# Patient Record
Sex: Female | Born: 1975 | Race: Black or African American | Hispanic: No | Marital: Married | Smoking: Never smoker
Health system: Southern US, Community
[De-identification: ages and names within clinical notes are randomized; demographics above are authoritative.]

## PROBLEM LIST (undated history)

## (undated) ENCOUNTER — Inpatient Hospital Stay (HOSPITAL_COMMUNITY): Payer: Self-pay

## (undated) DIAGNOSIS — R87629 Unspecified abnormal cytological findings in specimens from vagina: Secondary | ICD-10-CM

## (undated) DIAGNOSIS — N8110 Cystocele, unspecified: Secondary | ICD-10-CM

## (undated) DIAGNOSIS — Z8759 Personal history of other complications of pregnancy, childbirth and the puerperium: Secondary | ICD-10-CM

## (undated) DIAGNOSIS — R51 Headache: Secondary | ICD-10-CM

## (undated) DIAGNOSIS — N393 Stress incontinence (female) (male): Secondary | ICD-10-CM

## (undated) DIAGNOSIS — Z8639 Personal history of other endocrine, nutritional and metabolic disease: Secondary | ICD-10-CM

## (undated) DIAGNOSIS — O24419 Gestational diabetes mellitus in pregnancy, unspecified control: Secondary | ICD-10-CM

## (undated) DIAGNOSIS — R7303 Prediabetes: Secondary | ICD-10-CM

## (undated) DIAGNOSIS — D649 Anemia, unspecified: Secondary | ICD-10-CM

## (undated) DIAGNOSIS — O039 Complete or unspecified spontaneous abortion without complication: Secondary | ICD-10-CM

## (undated) DIAGNOSIS — K219 Gastro-esophageal reflux disease without esophagitis: Secondary | ICD-10-CM

## (undated) DIAGNOSIS — F1190 Opioid use, unspecified, uncomplicated: Secondary | ICD-10-CM

## (undated) DIAGNOSIS — Z9889 Other specified postprocedural states: Secondary | ICD-10-CM

## (undated) DIAGNOSIS — G43909 Migraine, unspecified, not intractable, without status migrainosus: Secondary | ICD-10-CM

## (undated) DIAGNOSIS — G8929 Other chronic pain: Secondary | ICD-10-CM

## (undated) DIAGNOSIS — L309 Dermatitis, unspecified: Secondary | ICD-10-CM

## (undated) DIAGNOSIS — F329 Major depressive disorder, single episode, unspecified: Secondary | ICD-10-CM

## (undated) DIAGNOSIS — K589 Irritable bowel syndrome without diarrhea: Secondary | ICD-10-CM

## (undated) DIAGNOSIS — Z8669 Personal history of other diseases of the nervous system and sense organs: Secondary | ICD-10-CM

## (undated) DIAGNOSIS — Z8632 Personal history of gestational diabetes: Secondary | ICD-10-CM

## (undated) DIAGNOSIS — Z87440 Personal history of urinary (tract) infections: Secondary | ICD-10-CM

## (undated) DIAGNOSIS — M793 Panniculitis, unspecified: Secondary | ICD-10-CM

## (undated) DIAGNOSIS — F419 Anxiety disorder, unspecified: Secondary | ICD-10-CM

## (undated) DIAGNOSIS — Z8659 Personal history of other mental and behavioral disorders: Secondary | ICD-10-CM

## (undated) DIAGNOSIS — IMO0002 Reserved for concepts with insufficient information to code with codable children: Secondary | ICD-10-CM

## (undated) DIAGNOSIS — Z8742 Personal history of other diseases of the female genital tract: Secondary | ICD-10-CM

## (undated) DIAGNOSIS — Z8619 Personal history of other infectious and parasitic diseases: Secondary | ICD-10-CM

## (undated) HISTORY — DX: Personal history of other endocrine, nutritional and metabolic disease: Z86.39

## (undated) HISTORY — DX: Gastro-esophageal reflux disease without esophagitis: K21.9

## (undated) HISTORY — DX: Personal history of other infectious and parasitic diseases: Z86.19

## (undated) HISTORY — DX: Reserved for concepts with insufficient information to code with codable children: IMO0002

## (undated) HISTORY — PX: WISDOM TOOTH EXTRACTION: SHX21

## (undated) HISTORY — DX: Personal history of other diseases of the nervous system and sense organs: Z86.69

## (undated) HISTORY — PX: GASTRIC BYPASS: SHX52

## (undated) HISTORY — DX: Personal history of urinary (tract) infections: Z87.440

## (undated) HISTORY — DX: Major depressive disorder, single episode, unspecified: F32.9

## (undated) HISTORY — DX: Personal history of other mental and behavioral disorders: Z86.59

---

## 1998-08-06 ENCOUNTER — Emergency Department (HOSPITAL_COMMUNITY): Admission: EM | Admit: 1998-08-06 | Discharge: 1998-08-06 | Payer: Self-pay | Admitting: Emergency Medicine

## 1998-09-11 ENCOUNTER — Other Ambulatory Visit: Admission: RE | Admit: 1998-09-11 | Discharge: 1998-09-11 | Payer: Self-pay | Admitting: *Deleted

## 1998-11-14 ENCOUNTER — Other Ambulatory Visit: Admission: RE | Admit: 1998-11-14 | Discharge: 1998-11-14 | Payer: Self-pay | Admitting: *Deleted

## 1999-01-01 ENCOUNTER — Emergency Department (HOSPITAL_COMMUNITY): Admission: EM | Admit: 1999-01-01 | Discharge: 1999-01-01 | Payer: Self-pay | Admitting: Emergency Medicine

## 1999-01-01 ENCOUNTER — Encounter: Payer: Self-pay | Admitting: Emergency Medicine

## 1999-02-20 ENCOUNTER — Other Ambulatory Visit: Admission: RE | Admit: 1999-02-20 | Discharge: 1999-02-20 | Payer: Self-pay | Admitting: Obstetrics and Gynecology

## 1999-04-10 ENCOUNTER — Inpatient Hospital Stay (HOSPITAL_COMMUNITY): Admission: AD | Admit: 1999-04-10 | Discharge: 1999-04-12 | Payer: Self-pay | Admitting: *Deleted

## 1999-05-12 ENCOUNTER — Other Ambulatory Visit: Admission: RE | Admit: 1999-05-12 | Discharge: 1999-05-12 | Payer: Self-pay | Admitting: Obstetrics and Gynecology

## 2000-09-12 ENCOUNTER — Emergency Department (HOSPITAL_COMMUNITY): Admission: EM | Admit: 2000-09-12 | Discharge: 2000-09-12 | Payer: Self-pay | Admitting: Emergency Medicine

## 2001-09-05 ENCOUNTER — Emergency Department (HOSPITAL_COMMUNITY): Admission: EM | Admit: 2001-09-05 | Discharge: 2001-09-05 | Payer: Self-pay | Admitting: Emergency Medicine

## 2001-10-20 ENCOUNTER — Emergency Department (HOSPITAL_COMMUNITY): Admission: EM | Admit: 2001-10-20 | Discharge: 2001-10-20 | Payer: Self-pay | Admitting: Emergency Medicine

## 2001-10-20 ENCOUNTER — Encounter: Payer: Self-pay | Admitting: Emergency Medicine

## 2001-10-31 ENCOUNTER — Ambulatory Visit (HOSPITAL_COMMUNITY): Admission: RE | Admit: 2001-10-31 | Discharge: 2001-10-31 | Payer: Self-pay | Admitting: Family Medicine

## 2001-12-15 ENCOUNTER — Encounter: Payer: Self-pay | Admitting: Obstetrics and Gynecology

## 2001-12-15 ENCOUNTER — Inpatient Hospital Stay (HOSPITAL_COMMUNITY): Admission: AD | Admit: 2001-12-15 | Discharge: 2001-12-15 | Payer: Self-pay | Admitting: Obstetrics and Gynecology

## 2002-01-10 ENCOUNTER — Other Ambulatory Visit: Admission: RE | Admit: 2002-01-10 | Discharge: 2002-01-10 | Payer: Self-pay | Admitting: Obstetrics and Gynecology

## 2002-03-01 ENCOUNTER — Encounter: Admission: RE | Admit: 2002-03-01 | Discharge: 2002-05-30 | Payer: Self-pay | Admitting: Obstetrics and Gynecology

## 2002-03-07 ENCOUNTER — Ambulatory Visit (HOSPITAL_COMMUNITY): Admission: RE | Admit: 2002-03-07 | Discharge: 2002-03-07 | Payer: Self-pay | Admitting: Obstetrics and Gynecology

## 2002-03-07 ENCOUNTER — Encounter: Payer: Self-pay | Admitting: Obstetrics and Gynecology

## 2002-04-25 ENCOUNTER — Inpatient Hospital Stay (HOSPITAL_COMMUNITY): Admission: AD | Admit: 2002-04-25 | Discharge: 2002-04-25 | Payer: Self-pay | Admitting: Obstetrics and Gynecology

## 2002-07-06 ENCOUNTER — Inpatient Hospital Stay: Admission: AD | Admit: 2002-07-06 | Discharge: 2002-07-06 | Payer: Self-pay | Admitting: Obstetrics and Gynecology

## 2002-07-07 ENCOUNTER — Inpatient Hospital Stay (HOSPITAL_COMMUNITY): Admission: AD | Admit: 2002-07-07 | Discharge: 2002-07-07 | Payer: Self-pay | Admitting: Obstetrics and Gynecology

## 2002-08-09 ENCOUNTER — Encounter: Payer: Self-pay | Admitting: Obstetrics and Gynecology

## 2002-08-09 ENCOUNTER — Inpatient Hospital Stay (HOSPITAL_COMMUNITY): Admission: AD | Admit: 2002-08-09 | Discharge: 2002-08-12 | Payer: Self-pay | Admitting: Obstetrics and Gynecology

## 2002-08-25 ENCOUNTER — Inpatient Hospital Stay (HOSPITAL_COMMUNITY): Admission: AD | Admit: 2002-08-25 | Discharge: 2002-08-25 | Payer: Self-pay | Admitting: Obstetrics and Gynecology

## 2003-04-07 ENCOUNTER — Emergency Department (HOSPITAL_COMMUNITY): Admission: EM | Admit: 2003-04-07 | Discharge: 2003-04-07 | Payer: Self-pay | Admitting: Emergency Medicine

## 2003-08-09 ENCOUNTER — Encounter: Payer: Self-pay | Admitting: Obstetrics and Gynecology

## 2003-08-09 ENCOUNTER — Inpatient Hospital Stay (HOSPITAL_COMMUNITY): Admission: AD | Admit: 2003-08-09 | Discharge: 2003-08-09 | Payer: Self-pay | Admitting: Obstetrics and Gynecology

## 2003-08-21 ENCOUNTER — Other Ambulatory Visit: Admission: RE | Admit: 2003-08-21 | Discharge: 2003-08-21 | Payer: Self-pay | Admitting: Obstetrics and Gynecology

## 2004-01-15 ENCOUNTER — Encounter: Admission: RE | Admit: 2004-01-15 | Discharge: 2004-01-15 | Payer: Self-pay | Admitting: Obstetrics and Gynecology

## 2004-01-15 ENCOUNTER — Inpatient Hospital Stay (HOSPITAL_COMMUNITY): Admission: AD | Admit: 2004-01-15 | Discharge: 2004-01-15 | Payer: Self-pay | Admitting: Obstetrics and Gynecology

## 2004-01-25 ENCOUNTER — Inpatient Hospital Stay (HOSPITAL_COMMUNITY): Admission: AD | Admit: 2004-01-25 | Discharge: 2004-01-25 | Payer: Self-pay | Admitting: Obstetrics and Gynecology

## 2004-02-18 ENCOUNTER — Inpatient Hospital Stay (HOSPITAL_COMMUNITY): Admission: AD | Admit: 2004-02-18 | Discharge: 2004-02-23 | Payer: Self-pay | Admitting: Obstetrics and Gynecology

## 2004-03-27 ENCOUNTER — Emergency Department (HOSPITAL_COMMUNITY): Admission: EM | Admit: 2004-03-27 | Discharge: 2004-03-27 | Payer: Self-pay | Admitting: Emergency Medicine

## 2004-03-31 ENCOUNTER — Emergency Department (HOSPITAL_COMMUNITY): Admission: EM | Admit: 2004-03-31 | Discharge: 2004-03-31 | Payer: Self-pay | Admitting: Family Medicine

## 2005-01-14 ENCOUNTER — Inpatient Hospital Stay (HOSPITAL_COMMUNITY): Admission: AD | Admit: 2005-01-14 | Discharge: 2005-01-14 | Payer: Self-pay | Admitting: Obstetrics and Gynecology

## 2005-02-01 ENCOUNTER — Emergency Department (HOSPITAL_COMMUNITY): Admission: EM | Admit: 2005-02-01 | Discharge: 2005-02-01 | Payer: Self-pay | Admitting: Family Medicine

## 2005-02-17 ENCOUNTER — Other Ambulatory Visit: Admission: RE | Admit: 2005-02-17 | Discharge: 2005-02-17 | Payer: Self-pay | Admitting: Obstetrics and Gynecology

## 2006-04-18 ENCOUNTER — Emergency Department (HOSPITAL_COMMUNITY): Admission: EM | Admit: 2006-04-18 | Discharge: 2006-04-18 | Payer: Self-pay | Admitting: Family Medicine

## 2006-07-17 ENCOUNTER — Inpatient Hospital Stay (HOSPITAL_COMMUNITY): Admission: AD | Admit: 2006-07-17 | Discharge: 2006-07-17 | Payer: Self-pay | Admitting: Obstetrics & Gynecology

## 2006-11-27 ENCOUNTER — Inpatient Hospital Stay (HOSPITAL_COMMUNITY): Admission: AD | Admit: 2006-11-27 | Discharge: 2006-11-27 | Payer: Self-pay | Admitting: Obstetrics & Gynecology

## 2006-12-07 HISTORY — PX: CHOLECYSTECTOMY: SHX55

## 2007-02-04 ENCOUNTER — Inpatient Hospital Stay (HOSPITAL_COMMUNITY): Admission: AD | Admit: 2007-02-04 | Discharge: 2007-02-04 | Payer: Self-pay | Admitting: Family Medicine

## 2007-03-19 ENCOUNTER — Inpatient Hospital Stay (HOSPITAL_COMMUNITY): Admission: AD | Admit: 2007-03-19 | Discharge: 2007-03-19 | Payer: Self-pay | Admitting: Obstetrics & Gynecology

## 2007-03-21 ENCOUNTER — Emergency Department (HOSPITAL_COMMUNITY): Admission: EM | Admit: 2007-03-21 | Discharge: 2007-03-21 | Payer: Self-pay | Admitting: Emergency Medicine

## 2007-04-02 ENCOUNTER — Inpatient Hospital Stay (HOSPITAL_COMMUNITY): Admission: AD | Admit: 2007-04-02 | Discharge: 2007-04-02 | Payer: Self-pay | Admitting: Family Medicine

## 2007-05-23 ENCOUNTER — Encounter (INDEPENDENT_AMBULATORY_CARE_PROVIDER_SITE_OTHER): Payer: Self-pay | Admitting: Internal Medicine

## 2007-05-23 ENCOUNTER — Ambulatory Visit: Payer: Self-pay | Admitting: Internal Medicine

## 2007-05-23 LAB — CONVERTED CEMR LAB
Cholesterol: 251 mg/dL
Creatinine, Ser: 0.73 mg/dL
HDL: 50 mg/dL
LDL Cholesterol: 180 mg/dL
Potassium: 3.7 meq/L
Triglycerides: 106 mg/dL

## 2007-05-24 ENCOUNTER — Ambulatory Visit: Payer: Self-pay | Admitting: Internal Medicine

## 2007-05-24 DIAGNOSIS — N921 Excessive and frequent menstruation with irregular cycle: Secondary | ICD-10-CM | POA: Insufficient documentation

## 2007-05-26 ENCOUNTER — Ambulatory Visit: Payer: Self-pay | Admitting: Internal Medicine

## 2007-07-01 ENCOUNTER — Ambulatory Visit: Payer: Self-pay | Admitting: Internal Medicine

## 2007-07-01 LAB — CONVERTED CEMR LAB
Basophils Absolute: 0.1 10*3/uL (ref 0.0–0.1)
Basophils Relative: 1 % (ref 0–1)
Eosinophils Absolute: 0.1 10*3/uL (ref 0.0–0.7)
Eosinophils Relative: 1 % (ref 0–5)
HCT: 41.6 %
HCT: 41.6 % (ref 36.0–46.0)
Hemoglobin: 14 g/dL
Hemoglobin: 14 g/dL (ref 12.0–15.0)
Lymphocytes Relative: 33 % (ref 12–46)
Lymphs Abs: 2.2 10*3/uL (ref 0.7–3.3)
MCHC: 33.7 g/dL (ref 30.0–36.0)
MCV: 81.1 fL (ref 78.0–100.0)
Monocytes Absolute: 0.5 10*3/uL (ref 0.2–0.7)
Monocytes Relative: 8 % (ref 3–11)
Neutro Abs: 3.9 10*3/uL (ref 1.7–7.7)
Neutrophils Relative %: 58 % (ref 43–77)
Platelets: 239 10*3/uL
Platelets: 239 10*3/uL (ref 150–400)
RBC: 5.13 M/uL
RBC: 5.13 M/uL — ABNORMAL HIGH (ref 3.87–5.11)
RDW: 13.6 % (ref 11.5–14.0)
WBC: 6.7 10*3/uL
WBC: 6.7 10*3/uL (ref 4.0–10.5)

## 2007-07-24 ENCOUNTER — Inpatient Hospital Stay (HOSPITAL_COMMUNITY): Admission: EM | Admit: 2007-07-24 | Discharge: 2007-07-26 | Payer: Self-pay | Admitting: Emergency Medicine

## 2007-07-24 ENCOUNTER — Ambulatory Visit: Payer: Self-pay | Admitting: Internal Medicine

## 2007-08-01 ENCOUNTER — Encounter (INDEPENDENT_AMBULATORY_CARE_PROVIDER_SITE_OTHER): Payer: Self-pay | Admitting: Internal Medicine

## 2007-08-01 DIAGNOSIS — E7800 Pure hypercholesterolemia, unspecified: Secondary | ICD-10-CM | POA: Insufficient documentation

## 2007-08-01 DIAGNOSIS — O99340 Other mental disorders complicating pregnancy, unspecified trimester: Secondary | ICD-10-CM

## 2007-08-01 DIAGNOSIS — F329 Major depressive disorder, single episode, unspecified: Secondary | ICD-10-CM | POA: Insufficient documentation

## 2007-08-01 DIAGNOSIS — E669 Obesity, unspecified: Secondary | ICD-10-CM | POA: Insufficient documentation

## 2007-08-01 DIAGNOSIS — L259 Unspecified contact dermatitis, unspecified cause: Secondary | ICD-10-CM | POA: Insufficient documentation

## 2007-08-02 ENCOUNTER — Ambulatory Visit: Payer: Self-pay | Admitting: Internal Medicine

## 2007-08-02 ENCOUNTER — Encounter: Payer: Self-pay | Admitting: Internal Medicine

## 2007-08-02 ENCOUNTER — Ambulatory Visit (HOSPITAL_COMMUNITY): Admission: RE | Admit: 2007-08-02 | Discharge: 2007-08-02 | Payer: Self-pay | Admitting: Internal Medicine

## 2007-08-02 ENCOUNTER — Encounter (INDEPENDENT_AMBULATORY_CARE_PROVIDER_SITE_OTHER): Payer: Self-pay | Admitting: Internal Medicine

## 2007-08-02 DIAGNOSIS — F411 Generalized anxiety disorder: Secondary | ICD-10-CM | POA: Insufficient documentation

## 2007-08-02 DIAGNOSIS — L509 Urticaria, unspecified: Secondary | ICD-10-CM | POA: Insufficient documentation

## 2007-08-02 DIAGNOSIS — R1013 Epigastric pain: Secondary | ICD-10-CM | POA: Insufficient documentation

## 2007-08-02 DIAGNOSIS — R109 Unspecified abdominal pain: Secondary | ICD-10-CM | POA: Insufficient documentation

## 2007-08-02 LAB — CONVERTED CEMR LAB
ALT: 18 units/L (ref 0–35)
AST: 15 units/L (ref 0–37)
Albumin: 3.9 g/dL (ref 3.5–5.2)
Alkaline Phosphatase: 66 units/L (ref 39–117)
BUN: 4 mg/dL — ABNORMAL LOW (ref 6–23)
Basophils Absolute: 0 10*3/uL (ref 0.0–0.1)
Basophils Relative: 1 % (ref 0–1)
CO2: 23 meq/L (ref 19–32)
Calcium: 9 mg/dL (ref 8.4–10.5)
Chloride: 106 meq/L (ref 96–112)
Creatinine, Ser: 0.76 mg/dL (ref 0.40–1.20)
Eosinophils Absolute: 0.1 10*3/uL (ref 0.0–0.7)
Eosinophils Relative: 1 % (ref 0–5)
Glucose, Bld: 95 mg/dL (ref 70–99)
HCT: 36.5 % (ref 36.0–46.0)
Hemoglobin: 12.1 g/dL (ref 12.0–15.0)
Lymphocytes Relative: 30 % (ref 12–46)
Lymphs Abs: 2 10*3/uL (ref 0.7–3.3)
MCHC: 33.2 g/dL (ref 30.0–36.0)
MCV: 83.1 fL (ref 78.0–100.0)
Monocytes Absolute: 0.4 10*3/uL (ref 0.2–0.7)
Monocytes Relative: 6 % (ref 3–11)
Neutro Abs: 4 10*3/uL (ref 1.7–7.7)
Neutrophils Relative %: 62 % (ref 43–77)
Platelets: 282 10*3/uL (ref 150–400)
Potassium: 4.8 meq/L (ref 3.5–5.3)
RBC: 4.39 M/uL (ref 3.87–5.11)
RDW: 15.5 % — ABNORMAL HIGH (ref 11.5–14.0)
Sodium: 139 meq/L (ref 135–145)
Total Bilirubin: 0.8 mg/dL (ref 0.3–1.2)
Total Protein: 6.8 g/dL (ref 6.0–8.3)
WBC: 6.5 10*3/uL (ref 4.0–10.5)

## 2007-08-04 ENCOUNTER — Telehealth (INDEPENDENT_AMBULATORY_CARE_PROVIDER_SITE_OTHER): Payer: Self-pay | Admitting: Internal Medicine

## 2007-08-04 ENCOUNTER — Emergency Department (HOSPITAL_COMMUNITY): Admission: EM | Admit: 2007-08-04 | Discharge: 2007-08-04 | Payer: Self-pay | Admitting: Emergency Medicine

## 2007-08-16 ENCOUNTER — Ambulatory Visit (HOSPITAL_COMMUNITY): Admission: RE | Admit: 2007-08-16 | Discharge: 2007-08-16 | Payer: Self-pay | Admitting: General Surgery

## 2007-08-16 ENCOUNTER — Encounter (INDEPENDENT_AMBULATORY_CARE_PROVIDER_SITE_OTHER): Payer: Self-pay | Admitting: General Surgery

## 2007-08-16 HISTORY — PX: CHOLECYSTECTOMY, LAPAROSCOPIC: SHX56

## 2007-08-26 ENCOUNTER — Encounter (INDEPENDENT_AMBULATORY_CARE_PROVIDER_SITE_OTHER): Payer: Self-pay | Admitting: Internal Medicine

## 2007-09-23 ENCOUNTER — Inpatient Hospital Stay (HOSPITAL_COMMUNITY): Admission: AD | Admit: 2007-09-23 | Discharge: 2007-09-24 | Payer: Self-pay | Admitting: Obstetrics and Gynecology

## 2007-09-28 ENCOUNTER — Telehealth (INDEPENDENT_AMBULATORY_CARE_PROVIDER_SITE_OTHER): Payer: Self-pay | Admitting: *Deleted

## 2007-10-21 ENCOUNTER — Encounter (INDEPENDENT_AMBULATORY_CARE_PROVIDER_SITE_OTHER): Payer: Self-pay | Admitting: Internal Medicine

## 2007-10-21 ENCOUNTER — Ambulatory Visit: Payer: Self-pay | Admitting: Internal Medicine

## 2007-10-21 DIAGNOSIS — F502 Bulimia nervosa, unspecified: Secondary | ICD-10-CM

## 2007-10-21 HISTORY — DX: Bulimia nervosa: F50.2

## 2007-10-21 HISTORY — DX: Bulimia nervosa, unspecified: F50.20

## 2007-10-21 LAB — CONVERTED CEMR LAB
Bilirubin Urine: NEGATIVE
Blood in Urine, dipstick: NEGATIVE
Glucose, Urine, Semiquant: NEGATIVE
Ketones, urine, test strip: NEGATIVE
Nitrite: NEGATIVE
Protein, U semiquant: 30
Specific Gravity, Urine: >=1.03
Urobilinogen, UA: NEGATIVE
WBC Urine, dipstick: NEGATIVE
pH: 6

## 2007-10-23 ENCOUNTER — Encounter (INDEPENDENT_AMBULATORY_CARE_PROVIDER_SITE_OTHER): Payer: Self-pay | Admitting: Internal Medicine

## 2007-10-23 LAB — CONVERTED CEMR LAB
ALT: 10 units/L (ref 0–35)
AST: 11 units/L (ref 0–37)
Albumin: 4.3 g/dL (ref 3.5–5.2)
Alkaline Phosphatase: 96 units/L (ref 39–117)
BUN: 7 mg/dL (ref 6–23)
Basophils Absolute: 0.1 10*3/uL (ref 0.0–0.1)
Basophils Relative: 1 % (ref 0–1)
CO2: 24 meq/L (ref 19–32)
Calcium: 8.9 mg/dL (ref 8.4–10.5)
Chlamydia, DNA Probe: NEGATIVE
Chloride: 102 meq/L (ref 96–112)
Cholesterol: 248 mg/dL — ABNORMAL HIGH (ref 0–200)
Creatinine, Ser: 0.8 mg/dL (ref 0.40–1.20)
Eosinophils Absolute: 0.1 10*3/uL — ABNORMAL LOW (ref 0.2–0.7)
Eosinophils Relative: 1 % (ref 0–5)
GC Probe Amp, Genital: NEGATIVE
Glucose, Bld: 70 mg/dL (ref 70–99)
HCT: 42.8 % (ref 36.0–46.0)
HDL: 52 mg/dL (ref >39)
Hemoglobin: 13.8 g/dL (ref 12.0–15.0)
LDL Cholesterol: 183 mg/dL — ABNORMAL HIGH (ref 0–99)
Lymphocytes Relative: 35 % (ref 12–46)
Lymphs Abs: 2.8 10*3/uL (ref 0.7–4.0)
MCHC: 32.2 g/dL (ref 30.0–36.0)
MCV: 88.2 fL (ref 78.0–100.0)
Monocytes Absolute: 0.5 10*3/uL (ref 0.1–1.0)
Monocytes Relative: 6 % (ref 3–12)
Neutro Abs: 4.6 10*3/uL (ref 1.7–7.7)
Neutrophils Relative %: 58 % (ref 43–77)
Platelets: 285 10*3/uL (ref 150–400)
Potassium: 4.1 meq/L (ref 3.5–5.3)
RBC: 4.85 M/uL (ref 3.87–5.11)
RDW: 13.1 % (ref 11.5–15.5)
Sodium: 138 meq/L (ref 135–145)
Total Bilirubin: 1 mg/dL (ref 0.3–1.2)
Total CHOL/HDL Ratio: 4.8
Total Protein: 7.4 g/dL (ref 6.0–8.3)
Triglycerides: 65 mg/dL (ref <150)
VLDL: 13 mg/dL (ref 0–40)
WBC: 8 10*3/uL (ref 4.0–10.5)

## 2007-11-29 ENCOUNTER — Telehealth (INDEPENDENT_AMBULATORY_CARE_PROVIDER_SITE_OTHER): Payer: Self-pay | Admitting: Internal Medicine

## 2007-12-06 ENCOUNTER — Inpatient Hospital Stay (HOSPITAL_COMMUNITY): Admission: AD | Admit: 2007-12-06 | Discharge: 2007-12-06 | Payer: Self-pay | Admitting: Obstetrics and Gynecology

## 2008-01-03 ENCOUNTER — Telehealth (INDEPENDENT_AMBULATORY_CARE_PROVIDER_SITE_OTHER): Payer: Self-pay | Admitting: Internal Medicine

## 2008-01-12 ENCOUNTER — Telehealth (INDEPENDENT_AMBULATORY_CARE_PROVIDER_SITE_OTHER): Payer: Self-pay | Admitting: Internal Medicine

## 2008-06-19 ENCOUNTER — Emergency Department (HOSPITAL_COMMUNITY): Admission: EM | Admit: 2008-06-19 | Discharge: 2008-06-19 | Payer: Self-pay | Admitting: Emergency Medicine

## 2008-07-03 ENCOUNTER — Inpatient Hospital Stay (HOSPITAL_COMMUNITY): Admission: AD | Admit: 2008-07-03 | Discharge: 2008-07-03 | Payer: Self-pay | Admitting: Obstetrics & Gynecology

## 2008-07-14 ENCOUNTER — Inpatient Hospital Stay (HOSPITAL_COMMUNITY): Admission: AD | Admit: 2008-07-14 | Discharge: 2008-07-14 | Payer: Self-pay | Admitting: Obstetrics & Gynecology

## 2008-07-27 ENCOUNTER — Inpatient Hospital Stay (HOSPITAL_COMMUNITY): Admission: AD | Admit: 2008-07-27 | Discharge: 2008-07-27 | Payer: Self-pay | Admitting: Family Medicine

## 2008-08-15 ENCOUNTER — Telehealth (INDEPENDENT_AMBULATORY_CARE_PROVIDER_SITE_OTHER): Payer: Self-pay | Admitting: Internal Medicine

## 2008-08-20 ENCOUNTER — Encounter (INDEPENDENT_AMBULATORY_CARE_PROVIDER_SITE_OTHER): Payer: Self-pay | Admitting: Internal Medicine

## 2008-08-20 LAB — CONVERTED CEMR LAB

## 2008-09-01 ENCOUNTER — Inpatient Hospital Stay (HOSPITAL_COMMUNITY): Admission: AD | Admit: 2008-09-01 | Discharge: 2008-09-01 | Payer: Self-pay | Admitting: Obstetrics and Gynecology

## 2008-10-09 ENCOUNTER — Inpatient Hospital Stay (HOSPITAL_COMMUNITY): Admission: AD | Admit: 2008-10-09 | Discharge: 2008-10-09 | Payer: Self-pay | Admitting: Obstetrics and Gynecology

## 2008-10-30 ENCOUNTER — Encounter: Admission: RE | Admit: 2008-10-30 | Discharge: 2008-10-30 | Payer: Self-pay | Admitting: Obstetrics and Gynecology

## 2008-12-19 ENCOUNTER — Encounter (INDEPENDENT_AMBULATORY_CARE_PROVIDER_SITE_OTHER): Payer: Self-pay | Admitting: Internal Medicine

## 2008-12-19 LAB — CONVERTED CEMR LAB

## 2009-02-03 ENCOUNTER — Emergency Department (HOSPITAL_COMMUNITY): Admission: EM | Admit: 2009-02-03 | Discharge: 2009-02-03 | Payer: Self-pay | Admitting: Emergency Medicine

## 2009-02-08 ENCOUNTER — Encounter (INDEPENDENT_AMBULATORY_CARE_PROVIDER_SITE_OTHER): Payer: Self-pay | Admitting: Obstetrics and Gynecology

## 2009-02-08 ENCOUNTER — Inpatient Hospital Stay (HOSPITAL_COMMUNITY): Admission: AD | Admit: 2009-02-08 | Discharge: 2009-02-10 | Payer: Self-pay | Admitting: Obstetrics and Gynecology

## 2009-04-01 ENCOUNTER — Telehealth (INDEPENDENT_AMBULATORY_CARE_PROVIDER_SITE_OTHER): Payer: Self-pay | Admitting: Internal Medicine

## 2009-04-25 ENCOUNTER — Ambulatory Visit: Payer: Self-pay | Admitting: Internal Medicine

## 2009-05-13 ENCOUNTER — Encounter (INDEPENDENT_AMBULATORY_CARE_PROVIDER_SITE_OTHER): Payer: Self-pay | Admitting: Internal Medicine

## 2009-11-13 ENCOUNTER — Inpatient Hospital Stay (HOSPITAL_COMMUNITY): Admission: AD | Admit: 2009-11-13 | Discharge: 2009-11-13 | Payer: Self-pay | Admitting: Obstetrics and Gynecology

## 2009-12-23 ENCOUNTER — Encounter: Admission: RE | Admit: 2009-12-23 | Discharge: 2010-01-10 | Payer: Self-pay | Admitting: Obstetrics and Gynecology

## 2010-01-09 ENCOUNTER — Ambulatory Visit (HOSPITAL_COMMUNITY): Admission: RE | Admit: 2010-01-09 | Discharge: 2010-01-09 | Payer: Self-pay | Admitting: Obstetrics and Gynecology

## 2010-01-13 ENCOUNTER — Inpatient Hospital Stay (HOSPITAL_COMMUNITY): Admission: AD | Admit: 2010-01-13 | Discharge: 2010-01-13 | Payer: Self-pay | Admitting: Obstetrics and Gynecology

## 2010-01-16 ENCOUNTER — Inpatient Hospital Stay (HOSPITAL_COMMUNITY): Admission: AD | Admit: 2010-01-16 | Discharge: 2010-01-16 | Payer: Self-pay | Admitting: Obstetrics and Gynecology

## 2010-01-23 ENCOUNTER — Inpatient Hospital Stay (HOSPITAL_COMMUNITY): Admission: AD | Admit: 2010-01-23 | Discharge: 2010-01-25 | Payer: Self-pay | Admitting: Obstetrics and Gynecology

## 2010-01-23 ENCOUNTER — Encounter (INDEPENDENT_AMBULATORY_CARE_PROVIDER_SITE_OTHER): Payer: Self-pay | Admitting: Obstetrics and Gynecology

## 2010-06-07 ENCOUNTER — Inpatient Hospital Stay (HOSPITAL_COMMUNITY)
Admission: AD | Admit: 2010-06-07 | Discharge: 2010-06-07 | Payer: Self-pay | Source: Home / Self Care | Admitting: Obstetrics & Gynecology

## 2010-12-04 ENCOUNTER — Emergency Department (HOSPITAL_COMMUNITY)
Admission: EM | Admit: 2010-12-04 | Discharge: 2010-12-04 | Payer: Self-pay | Source: Home / Self Care | Admitting: Emergency Medicine

## 2010-12-07 HISTORY — PX: HERNIA REPAIR: SHX51

## 2010-12-07 HISTORY — PX: HIATAL HERNIA REPAIR: SHX195

## 2010-12-08 ENCOUNTER — Encounter (INDEPENDENT_AMBULATORY_CARE_PROVIDER_SITE_OTHER): Payer: Self-pay | Admitting: Internal Medicine

## 2010-12-15 ENCOUNTER — Ambulatory Visit (HOSPITAL_COMMUNITY)
Admission: RE | Admit: 2010-12-15 | Discharge: 2010-12-15 | Payer: Self-pay | Source: Home / Self Care | Attending: Gastroenterology | Admitting: Gastroenterology

## 2011-01-08 NOTE — Miscellaneous (Signed)
  Clinical Lists Changes  Observations: Added new observation of PAP SMEAR: ASCUS with [facility] (08/20/2008 12:23)

## 2011-01-08 NOTE — Letter (Signed)
Summary: [facility]//DR.RIVARD  [facility]//DR.RIVARD   Imported By: Arta Bruce 12/16/2010 12:23:14  _____________________________________________________________________  External Attachment:    Type:   Image     Comment:   External Document

## 2011-01-08 NOTE — Miscellaneous (Signed)
  Clinical Lists Changes  Problems: Added new problem of History of  PAP SMEAR, ABNORMAL, ASCUS WITH HIGH RISK HPV (ICD-795.01) - Normal in 2010 Observations: Added new observation of PAP SMEAR: Negative for intraepithelial lesions or malignancy  Dr. Osborn Coho (12/19/2008 12:25)

## 2011-02-04 ENCOUNTER — Other Ambulatory Visit (HOSPITAL_COMMUNITY): Payer: Self-pay | Admitting: Surgery

## 2011-02-04 DIAGNOSIS — K219 Gastro-esophageal reflux disease without esophagitis: Secondary | ICD-10-CM

## 2011-02-04 DIAGNOSIS — R112 Nausea with vomiting, unspecified: Secondary | ICD-10-CM

## 2011-02-10 ENCOUNTER — Ambulatory Visit (HOSPITAL_COMMUNITY)
Admission: RE | Admit: 2011-02-10 | Discharge: 2011-02-10 | Disposition: A | Discharge status: Home / Self Care | Payer: Medicaid Other | Source: Ambulatory Visit | Attending: Surgery | Admitting: Surgery

## 2011-02-10 DIAGNOSIS — K3189 Other diseases of stomach and duodenum: Secondary | ICD-10-CM | POA: Insufficient documentation

## 2011-02-10 DIAGNOSIS — R112 Nausea with vomiting, unspecified: Secondary | ICD-10-CM

## 2011-02-10 DIAGNOSIS — K219 Gastro-esophageal reflux disease without esophagitis: Secondary | ICD-10-CM | POA: Insufficient documentation

## 2011-02-10 MED ORDER — TECHNETIUM TC 99M SULFUR COLLOID
2.0000 | Freq: Once | INTRAVENOUS | Status: AC | PRN
  Administered 2011-02-10: 2 via ORAL

## 2011-02-15 ENCOUNTER — Ambulatory Visit (INDEPENDENT_AMBULATORY_CARE_PROVIDER_SITE_OTHER): Payer: Medicaid Other

## 2011-02-15 ENCOUNTER — Inpatient Hospital Stay (INDEPENDENT_AMBULATORY_CARE_PROVIDER_SITE_OTHER)
Admission: RE | Admit: 2011-02-15 | Discharge: 2011-02-15 | Disposition: A | Discharge status: Home / Self Care | Payer: Medicaid Other | Source: Ambulatory Visit | Attending: Emergency Medicine | Admitting: Emergency Medicine

## 2011-02-15 DIAGNOSIS — IMO0002 Reserved for concepts with insufficient information to code with codable children: Secondary | ICD-10-CM

## 2011-02-16 LAB — POCT I-STAT, CHEM 8
BUN: 9 mg/dL (ref 6–23)
Calcium, Ion: 1.18 mmol/L (ref 1.12–1.32)
Chloride: 106 mEq/L (ref 96–112)
Creatinine, Ser: 1 mg/dL (ref 0.4–1.2)
Glucose, Bld: 91 mg/dL (ref 70–99)
HCT: 40 % (ref 36.0–46.0)
Hemoglobin: 13.6 g/dL (ref 12.0–15.0)
Potassium: 4 mEq/L (ref 3.5–5.1)
Sodium: 140 mEq/L (ref 135–145)
TCO2: 26 mmol/L (ref 0–100)

## 2011-02-16 LAB — URINALYSIS, ROUTINE W REFLEX MICROSCOPIC
Bilirubin Urine: NEGATIVE
Glucose, UA: NEGATIVE mg/dL
Hgb urine dipstick: NEGATIVE
Ketones, ur: 15 mg/dL — AB
Nitrite: NEGATIVE
Protein, ur: NEGATIVE mg/dL
Specific Gravity, Urine: 1.016 (ref 1.005–1.030)
Urobilinogen, UA: 0.2 mg/dL (ref 0.0–1.0)
pH: 6 (ref 5.0–8.0)

## 2011-02-16 LAB — CBC
HCT: 39.6 % (ref 36.0–46.0)
Hemoglobin: 12.6 g/dL (ref 12.0–15.0)
MCH: 27 pg (ref 26.0–34.0)
MCHC: 31.8 g/dL (ref 30.0–36.0)
MCV: 84.8 fL (ref 78.0–100.0)
Platelets: 287 10*3/uL (ref 150–400)
RBC: 4.67 MIL/uL (ref 3.87–5.11)
RDW: 13.9 % (ref 11.5–15.5)
WBC: 9.4 10*3/uL (ref 4.0–10.5)

## 2011-02-16 LAB — PREGNANCY, URINE: Preg Test, Ur: NEGATIVE

## 2011-02-16 LAB — DIFFERENTIAL
Basophils Absolute: 0.1 10*3/uL (ref 0.0–0.1)
Basophils Relative: 1 % (ref 0–1)
Eosinophils Absolute: 0.1 10*3/uL (ref 0.0–0.7)
Eosinophils Relative: 2 % (ref 0–5)
Lymphocytes Relative: 35 % (ref 12–46)
Lymphs Abs: 3.3 10*3/uL (ref 0.7–4.0)
Monocytes Absolute: 0.6 10*3/uL (ref 0.1–1.0)
Monocytes Relative: 7 % (ref 3–12)
Neutro Abs: 5.3 10*3/uL (ref 1.7–7.7)
Neutrophils Relative %: 56 % (ref 43–77)

## 2011-02-16 LAB — URINE CULTURE
Colony Count: 3000
Culture  Setup Time: 201112290829

## 2011-02-16 LAB — URINE MICROSCOPIC-ADD ON

## 2011-02-22 LAB — WET PREP, GENITAL
Clue Cells Wet Prep HPF POC: NONE SEEN
Trich, Wet Prep: NONE SEEN
Yeast Wet Prep HPF POC: NONE SEEN

## 2011-02-25 LAB — CBC
HCT: 31.8 % — ABNORMAL LOW (ref 36.0–46.0)
HCT: 33.2 % — ABNORMAL LOW (ref 36.0–46.0)
Hemoglobin: 10.6 g/dL — ABNORMAL LOW (ref 12.0–15.0)
Hemoglobin: 10.9 g/dL — ABNORMAL LOW (ref 12.0–15.0)
MCHC: 32.9 g/dL (ref 30.0–36.0)
MCHC: 33.3 g/dL (ref 30.0–36.0)
MCV: 88 fL (ref 78.0–100.0)
MCV: 88.2 fL (ref 78.0–100.0)
Platelets: 189 10*3/uL (ref 150–400)
Platelets: 227 10*3/uL (ref 150–400)
RBC: 3.61 MIL/uL — ABNORMAL LOW (ref 3.87–5.11)
RBC: 3.78 MIL/uL — ABNORMAL LOW (ref 3.87–5.11)
RDW: 14.7 % (ref 11.5–15.5)
RDW: 14.9 % (ref 11.5–15.5)
WBC: 10 10*3/uL (ref 4.0–10.5)
WBC: 13.1 10*3/uL — ABNORMAL HIGH (ref 4.0–10.5)

## 2011-02-25 LAB — RPR: RPR Ser Ql: NONREACTIVE

## 2011-02-25 LAB — ABO/RH: ABO/RH(D): O POS

## 2011-03-10 LAB — URINALYSIS, ROUTINE W REFLEX MICROSCOPIC
Bilirubin Urine: NEGATIVE
Glucose, UA: NEGATIVE mg/dL
Hgb urine dipstick: NEGATIVE
Ketones, ur: >80 mg/dL — AB
Nitrite: NEGATIVE
Protein, ur: NEGATIVE mg/dL
Specific Gravity, Urine: >1.03 — ABNORMAL HIGH (ref 1.005–1.030)
Urobilinogen, UA: 0.2 mg/dL (ref 0.0–1.0)
pH: 6 (ref 5.0–8.0)

## 2011-03-10 LAB — CBC
HCT: 34.5 % — ABNORMAL LOW (ref 36.0–46.0)
Hemoglobin: 11.6 g/dL — ABNORMAL LOW (ref 12.0–15.0)
MCHC: 33.5 g/dL (ref 30.0–36.0)
MCV: 88.9 fL (ref 78.0–100.0)
Platelets: 225 10*3/uL (ref 150–400)
RBC: 3.89 MIL/uL (ref 3.87–5.11)
RDW: 14.8 % (ref 11.5–15.5)
WBC: 6.6 10*3/uL (ref 4.0–10.5)

## 2011-03-10 LAB — COMPREHENSIVE METABOLIC PANEL
ALT: 13 U/L (ref 0–35)
AST: 15 U/L (ref 0–37)
Albumin: 2.6 g/dL — ABNORMAL LOW (ref 3.5–5.2)
Alkaline Phosphatase: 74 U/L (ref 39–117)
BUN: 4 mg/dL — ABNORMAL LOW (ref 6–23)
CO2: 23 mEq/L (ref 19–32)
Calcium: 8.6 mg/dL (ref 8.4–10.5)
Chloride: 106 mEq/L (ref 96–112)
Creatinine, Ser: 0.53 mg/dL (ref 0.4–1.2)
GFR calc Af Amer: >60 mL/min (ref >60)
GFR calc non Af Amer: >60 mL/min (ref >60)
Glucose, Bld: 80 mg/dL (ref 70–99)
Potassium: 3.6 mEq/L (ref 3.5–5.1)
Sodium: 135 mEq/L (ref 135–145)
Total Bilirubin: 0.6 mg/dL (ref 0.3–1.2)
Total Protein: 6.2 g/dL (ref 6.0–8.3)

## 2011-03-10 LAB — GC/CHLAMYDIA PROBE AMP, GENITAL
Chlamydia, DNA Probe: NEGATIVE
GC Probe Amp, Genital: NEGATIVE

## 2011-03-10 LAB — WET PREP, GENITAL
Clue Cells Wet Prep HPF POC: NONE SEEN
Trich, Wet Prep: NONE SEEN
Yeast Wet Prep HPF POC: NONE SEEN

## 2011-03-19 ENCOUNTER — Inpatient Hospital Stay (HOSPITAL_COMMUNITY)
Admission: AD | Admit: 2011-03-19 | Discharge: 2011-03-19 | Disposition: A | Discharge status: Home / Self Care | Payer: Medicaid Other | Source: Ambulatory Visit | Attending: Obstetrics and Gynecology | Admitting: Obstetrics and Gynecology

## 2011-03-19 DIAGNOSIS — N949 Unspecified condition associated with female genital organs and menstrual cycle: Secondary | ICD-10-CM | POA: Insufficient documentation

## 2011-03-19 DIAGNOSIS — B3731 Acute candidiasis of vulva and vagina: Secondary | ICD-10-CM | POA: Insufficient documentation

## 2011-03-19 DIAGNOSIS — N938 Other specified abnormal uterine and vaginal bleeding: Secondary | ICD-10-CM | POA: Insufficient documentation

## 2011-03-19 DIAGNOSIS — B373 Candidiasis of vulva and vagina: Secondary | ICD-10-CM | POA: Insufficient documentation

## 2011-03-19 LAB — GLUCOSE, CAPILLARY
Glucose-Capillary: 118 mg/dL — ABNORMAL HIGH (ref 70–99)
Glucose-Capillary: 131 mg/dL — ABNORMAL HIGH (ref 70–99)
Glucose-Capillary: 157 mg/dL — ABNORMAL HIGH (ref 70–99)
Glucose-Capillary: 61 mg/dL — ABNORMAL LOW (ref 70–99)
Glucose-Capillary: 69 mg/dL — ABNORMAL LOW (ref 70–99)
Glucose-Capillary: 79 mg/dL (ref 70–99)
Glucose-Capillary: 83 mg/dL (ref 70–99)
Glucose-Capillary: 90 mg/dL (ref 70–99)
Glucose-Capillary: 91 mg/dL (ref 70–99)
Glucose-Capillary: 91 mg/dL (ref 70–99)

## 2011-03-19 LAB — CBC
HCT: 30.4 % — ABNORMAL LOW (ref 36.0–46.0)
HCT: 35.4 % — ABNORMAL LOW (ref 36.0–46.0)
Hemoglobin: 10.1 g/dL — ABNORMAL LOW (ref 12.0–15.0)
Hemoglobin: 11.7 g/dL — ABNORMAL LOW (ref 12.0–15.0)
MCHC: 33.1 g/dL (ref 30.0–36.0)
MCHC: 33.2 g/dL (ref 30.0–36.0)
MCV: 85.8 fL (ref 78.0–100.0)
MCV: 86.4 fL (ref 78.0–100.0)
Platelets: 204 10*3/uL (ref 150–400)
Platelets: 231 10*3/uL (ref 150–400)
RBC: 3.52 MIL/uL — ABNORMAL LOW (ref 3.87–5.11)
RBC: 4.13 MIL/uL (ref 3.87–5.11)
RDW: 14.3 % (ref 11.5–15.5)
RDW: 14.7 % (ref 11.5–15.5)
WBC: 11.4 10*3/uL — ABNORMAL HIGH (ref 4.0–10.5)
WBC: 12.6 10*3/uL — ABNORMAL HIGH (ref 4.0–10.5)

## 2011-03-19 LAB — WET PREP, GENITAL
Clue Cells Wet Prep HPF POC: NONE SEEN
Trich, Wet Prep: NONE SEEN
Yeast Wet Prep HPF POC: NONE SEEN

## 2011-03-19 LAB — GLUCOSE, RANDOM: Glucose, Bld: 91 mg/dL (ref 70–99)

## 2011-03-19 LAB — POCT PREGNANCY, URINE: Preg Test, Ur: NEGATIVE

## 2011-03-19 LAB — RPR: RPR Ser Ql: NONREACTIVE

## 2011-03-19 LAB — HCG, QUANTITATIVE, PREGNANCY: hCG, Beta Chain, Quant, S: <2 m[IU]/mL (ref <5)

## 2011-03-20 LAB — GC/CHLAMYDIA PROBE AMP, GENITAL
Chlamydia, DNA Probe: NEGATIVE
GC Probe Amp, Genital: NEGATIVE

## 2011-04-07 ENCOUNTER — Other Ambulatory Visit: Payer: Self-pay | Admitting: Surgery

## 2011-04-07 ENCOUNTER — Encounter (HOSPITAL_COMMUNITY): Payer: Medicaid Other | Attending: Surgery

## 2011-04-07 DIAGNOSIS — K449 Diaphragmatic hernia without obstruction or gangrene: Secondary | ICD-10-CM | POA: Insufficient documentation

## 2011-04-07 DIAGNOSIS — Z01812 Encounter for preprocedural laboratory examination: Secondary | ICD-10-CM | POA: Insufficient documentation

## 2011-04-07 DIAGNOSIS — K219 Gastro-esophageal reflux disease without esophagitis: Secondary | ICD-10-CM | POA: Insufficient documentation

## 2011-04-07 DIAGNOSIS — Z79899 Other long term (current) drug therapy: Secondary | ICD-10-CM | POA: Insufficient documentation

## 2011-04-07 LAB — SURGICAL PCR SCREEN
MRSA, PCR: NEGATIVE
Staphylococcus aureus: NEGATIVE

## 2011-04-07 LAB — BASIC METABOLIC PANEL
BUN: 10 mg/dL (ref 6–23)
CO2: 24 mEq/L (ref 19–32)
Calcium: 9.3 mg/dL (ref 8.4–10.5)
Chloride: 106 mEq/L (ref 96–112)
Creatinine, Ser: 0.6 mg/dL (ref 0.4–1.2)
GFR calc Af Amer: >60 mL/min (ref >60)
GFR calc non Af Amer: >60 mL/min (ref >60)
Glucose, Bld: 103 mg/dL — ABNORMAL HIGH (ref 70–99)
Potassium: 4 mEq/L (ref 3.5–5.1)
Sodium: 139 mEq/L (ref 135–145)

## 2011-04-07 LAB — CBC
HCT: 39.7 % (ref 36.0–46.0)
Hemoglobin: 12.8 g/dL (ref 12.0–15.0)
MCH: 27.3 pg (ref 26.0–34.0)
MCHC: 32.2 g/dL (ref 30.0–36.0)
MCV: 84.6 fL (ref 78.0–100.0)
Platelets: 289 10*3/uL (ref 150–400)
RBC: 4.69 MIL/uL (ref 3.87–5.11)
RDW: 14.3 % (ref 11.5–15.5)
WBC: 8 10*3/uL (ref 4.0–10.5)

## 2011-04-07 LAB — HCG, SERUM, QUALITATIVE: Preg, Serum: NEGATIVE

## 2011-04-16 ENCOUNTER — Ambulatory Visit (HOSPITAL_COMMUNITY)
Admission: RE | Admit: 2011-04-16 | Discharge: 2011-04-19 | DRG: 328 | Disposition: A | Discharge status: Home / Self Care | Payer: Medicaid Other | Source: Ambulatory Visit | Attending: Surgery | Admitting: Surgery

## 2011-04-16 DIAGNOSIS — K219 Gastro-esophageal reflux disease without esophagitis: Secondary | ICD-10-CM | POA: Diagnosis present

## 2011-04-16 DIAGNOSIS — Z01812 Encounter for preprocedural laboratory examination: Secondary | ICD-10-CM

## 2011-04-16 DIAGNOSIS — K449 Diaphragmatic hernia without obstruction or gangrene: Secondary | ICD-10-CM | POA: Diagnosis present

## 2011-04-16 HISTORY — PX: LAPAROSCOPIC NISSEN FUNDOPLICATION: SHX1932

## 2011-04-16 LAB — ABO/RH: ABO/RH(D): O POS

## 2011-04-16 LAB — TYPE AND SCREEN
ABO/RH(D): O POS
Antibody Screen: NEGATIVE

## 2011-04-17 ENCOUNTER — Inpatient Hospital Stay (HOSPITAL_COMMUNITY): Payer: Medicaid Other

## 2011-04-17 MED ORDER — IOHEXOL 300 MG/ML  SOLN: 50.0000 mL | Freq: Once | INTRAMUSCULAR | Status: AC | PRN

## 2011-04-21 NOTE — H&P (Signed)
Shelly Lane, Shelly Lane            ACCOUNT NO.:  [id number]   MEDICAL RECORD NO.:  [id number]          PATIENT TYPE:  INP   LOCATION:  9168                          FACILITY:  [facility]   PHYSICIAN:  Crist Fat. Rivard, M.D. DATE OF BIRTH:  1976-05-10   DATE OF ADMISSION:  02/08/2009  DATE OF DISCHARGE:                              HISTORY & PHYSICAL   [name] is a 35 year old separated African American female G7, P3-  1-2-4 who presents on date of admission at 36-2/7 weeks with chief  complaint of vaginal bleeding, contractions, reported vaginal bleeding  with clots the size of golf balls.  Denies any other complaints or  current illness.  She had finished a Z-Pak on Tuesday for upper  respiratory infection.  No gushes of watery fluid.  She did report good  fetal movement.  She had been seen in the office yesterday for her  routine ob care and had an ultrasound that showed a BPP of 8/8,  estimated fetal weight was 6/9 and AFI was 40th percentile.  She awoke  early this morning with the feeling the bleeding and then came on in.  She has been followed by MD service at [facility].  Her history has been  remarkable for 1) a history of preterm labor but term delivery, 2)  history of gestational diabetes which she has again this pregnancy and  has been insulin dependent, 3) history of first trimester bleeding, 4)  history of positive GBS and positive GBS this pregnancy as well, 5)  lastly cholecystectomy.   OBSTETRICAL HISTORY:  Gravida 1 with a spontaneous vaginal delivery at  [redacted] weeks gestation December of 1998 a female weighing 7/6.  After 12-1/2  hours of labor she had an epidural, no complications and that baby's  name is Shelly Lane.  Gravida 2 was as spontaneous vaginal delivery at 39  weeks in May of 2000, a female weighing 7/9.  After 5 hours of labor she  did have an epidural, no complications and that baby's name is Shelly Lane.  Gravida 3 spontaneous vaginal delivery 21 hours of labor.  She was  at [redacted]  weeks gestation.  Weight 8/15, a female, September of 2003.  She pushed  for 4 hours during that one and the boy's name is Shelly Lane.  Gravida 4  spontaneously vaginal delivery 36/2, weight 6 pounds 5 ounces, a female  March of 2005.  Five hours of labor.  Epidural induced for gestational  diabetes and that girl's name is Shelly Lane.  Gravida 5 spontaneous abortion  9 weeks March of 2008.  She did have a spontaneous abortion first  trimester in 1998 and that would have been another gravid and then  gravida 7 is current pregnancy.   PRENATAL LABS:  Blood type is O positive.  Rh antibody screen negative.  HIV nonreactive.  Hepatitis surface antigen negative.  Hemoglobin  September 1 was 12.6, hematocrit 38.5, rubella is immune.  RPR  nonreactive.   PAST MEDICAL HISTORY:  Allergies - denies medication or latex allergy  but has seasonal allergies.   She had anemia with first pregnancy on iron supplement, gestational  diabetes with insulin with her previous pregnancy with delivery in 2005  and also with this pregnancy.  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  She did have preterm delivery at 65 and 2  with previous delivery as well but was induced for gestational diabetes.  Contraception, she has used Ortho Tri-Cyclen, Depo-Provera, NuvaRing,  Ortho Evra patch, IUD.  She has had a history of abnormal Pap with last  pregnancy.  Colpo was within normal limits.  GBS positive this  pregnancy.  Varicella as a child.  GERD.  She had been on Nexium in the  past.  History of anxiety.  Was on a questionable medication in the  past.  She had a cholecystectomy looks like 1998.  She was hospitalized  for low potassium prior to the cholecystectomy.   GENETIC HISTORY:  Is unremarkable.   MENSTRUAL HISTORY:  Menarche age 52, monthly cycles no abnormalities,  reported LMP May 31, 2008.  [facility] March 07, 2009.   FAMILY HISTORY:  Maternal grandmother high blood pressure and  on  medication.  Mom anemia.  Maternal grandmother diabetic oral medication.   SOCIAL HISTORY:  Separated African American female.  She is of Muslim  faith.  The father of the baby's name is Mr. Baldo Daub.  The patient has a  high school diploma.  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  Father of the baby has bachelor's  degree, works full time at [employer].  Denied alcohol, tobacco or illicit  drug use.   HISTORY OF PRESENT PREGNANCY:  Entered care for ob interview September  11.  She was approximately 11 weeks and 2 days.  Her ______ weight was  259.  She was 269 on that date.  She is 5 feet 6-1/2 inches.  BMI 42.  She had had an evaluation at [facility] 3 weeks prior to that for  vaginal bleeding.  Had some visual disturbances.  Had Pap and cultures  that day with prenatal labs.  UA was sent for culture.  She had some  anxiety she reported that morning.  Planned care with Dr. Estanislado Pandy.  Urine  culture was negative from a new ob.  First trimester screening was done  at 64 and 4.  Positive natal bone.  The patient did have a 5 pound  weight gain in 2-1/2 weeks and Dr. Pennie Rushing had noted if continued  increase do 3 hour GGT as soon as possible.  She did have a hemoglobin  A1c drawn October 2 and was 5.6.  Had colposcopy early pregnancy for  abnormal Pap.  Her first trimester screening was normal.  She had ASCUS  HPV on her Pap and had her colpo on November 6.  On November 9 she  returned for her Glucola and it was equal to 133.  Plan was made for her  to have a 3 hour GGT.  She had her 9 week ultrasound then.  IUP size  equal to date, cervix is 4.85, AFI normal, anterior placenta, normal  growth and development except left EIF x2, suggests a female, adjusted  risk 1 in 86.  Was diagnosed thereafter with gestational diabetes  after a 3 hour.  She was started at 23 and 6 on NPH 8 units q.h.s.  She  was increased at 25 weeks her NPH to 12 units.   Prescribed Flexeril and  Darvocet around 27 weeks for discomfort.  Nexium as well.  Had  an  ultrasound at 28 and 1.  Estimated fetal weight was 86 percentile.  Cervix was 3.87, normal fluid.  Had Pap repeated that day.  At 31 weeks  NPH was 18 units q.h.s.  She was increased to 20 units of NPH around 32  weeks.  She had ultrasound that day, vertex, AFI normal.  Average  gestational age.  Estimated fetal weight was 5.1 which was greater than  the 90 percentile.  She did begin antenatal testing at that time.  At 34  weeks was complaining of upper respiratory complaints and was prescribed  Z-Pak.  NPH was up to 20 units q.h.s. at 34 weeks.   Pregnancy continued to progress until presented early this morning with  chief complaint of vaginal bleeding and contractions and in early labor.  Once she got here she did have an ultrasound and had 6/8 BPP.  No fetal  breathing motions were noted.  AFI was 63rd percentile.  Placenta was  anterior above cervical os and no abruption of previa was noted.  Once  she was placed back on the external monitors after going to ultrasound  she had a reactive tracing.  It was reassuring.  Baseline was in 140s,  moderate variability, no decels and contractions were every 2-4 minutes.  On cervical check she was 4-5 cm, 60-70%, -3 to -2 station with a  bulging bag of water.  Moderate amount of blood was on glove.  She did  have some small amount of blood on external genitalia.  She did not have  a pad on at the time.   ASSESSMENT:  1. Intrauterine pregnancy at 109 and 1 in early labor and with      significant vaginal bleeding.  2. 6/8 BPP but with reactive heart tracing.  3. GBS positive.  4. A2 diabetes.   PLAN:  1. Consulted with Dr. Estanislado Pandy and will admit to birthing suites with      Dr. Estanislado Pandy as attending physician.  2. Routine L and D orders with addition of CBGs every 4 hours.  3. Penicillin G IV per GBS protocol.  4. Epidural p.r.n.  5. MD is to  follow for planned AROM after penicillin on board.      Candice [place], CNM      Dois Davenport A. Rivard, M.D.  Electronically Signed    CHS/MEDQ  D:  02/08/2009  T:  02/08/2009  Job:  [id number]

## 2011-04-21 NOTE — Op Note (Signed)
Shelly Lane, Shelly Lane            ACCOUNT NO.:  [id number]   MEDICAL RECORD NO.:  [id number]          PATIENT TYPE:  AMB   LOCATION:  DAY                          FACILITY:  [facility]   PHYSICIAN:  Angelia Mould. Derrell Lolling, M.D.DATE OF BIRTH:  03-03-1976   DATE OF PROCEDURE:  08/16/2007  DATE OF DISCHARGE:                               OPERATIVE REPORT   PREOPERATIVE DIAGNOSIS:  Chronic cholecystitis with cholelithiasis.   POSTOPERATIVE DIAGNOSIS:  Chronic cholecystitis with cholelithiasis.   OPERATION PERFORMED:  Laparoscopic cholecystectomy with intraoperative  cholangiogram.   SURGEON:  Angelia Mould. Derrell Lolling, M.D.   FIRST ASSISTANT:  Velora Heckler, M.D.   OPERATIVE INDICATIONS:  This is a 35 year old black female who has a one  month history of epigastric and right upper quadrant pain exacerbated by  meals.  This is intermittent.  She has been evaluated in the emergency  department recently and was actually hospitalized because of a serum  potassium of 2.3.  Ultrasound shows a small gallbladder, normal common  bile duct, multiple gallstones.  She has been evaluated electively in  the office and was counseled regarding cholecystectomy.  She desires  cholecystectomy.  She is brought to operating room electively.   OPERATIVE FINDINGS:  The gallbladder was thin walled and not acutely  inflamed.  The cystic duct was patent but was tiny.  The anatomy of the  cystic duct, common bile duct, and cystic artery were conventional.  The  liver, stomach, duodenum, small intestine, large intestine were normal  to inspection.  The uterus looked enlarged and suggested there were  fibroids present.  The left ovary was large but there was no neoplastic  mass present.   OPERATIVE TECHNIQUE:  Following the induction of general endotracheal  anesthesia, the patient's abdomen was prepped and draped in a sterile  fashion.  Intravenous antibiotics were given.  The patient was  identified as to correct patient  and correct procedure.  0.5% Marcaine  with epinephrine was used as a local infiltration anesthetic.  A  vertically oriented incision was made at the lower rim of the umbilicus.  The fascia was incised in the midline and the abdominal cavity entered  under direct vision.  A 10 mm Hassan trocar was inserted and secured  with a pursestring suture of 0 Vicryl.  Pneumoperitoneum was created.  The video camera was inserted with visualization and findings as  described above.  A 10 mm trocar was placed in the subxiphoid region and  two 5 mm trocars were placed in the right upper quadrant.  The  gallbladder was elevated.  The infundibulum was retracted laterally.  We  dissected out the cystic duct and cystic artery.  A cholangiogram  catheter was inserted into the cystic artery.   A cholangiogram was obtained using the C-arm.  The cholangiogram showed  normal intrahepatic and extrahepatic bile duct anatomy, small caliber  ducts, no filling defects, and no obstruction with good flow of contrast  into the duodenum.   The cholangiogram catheter was removed.  The cystic duct was secured  with multiple metal clips and divided.  The cystic artery was secured  with multiple metal clips and divided.  The gallbladder was dissected  from its bed with electrocautery and removed through the umbilical port.  It should be noted that on the right lateral aspect of the wall of the  gallbladder about 2 cm above the cystic duct, there was another  posterior branch of the cystic artery that was controlled with metal  clips and divided.   The operative field was irrigated.  The irrigation fluid was completely  clear.  There was no bleeding and no bile leak whatsoever.  The clips  appeared secure.  The trocars were removed under direct vision.  There  is no bleeding from trocar sites.  Pneumoperitoneum was released.  The  fascia at the umbilicus was closed with 0 Vicryl sutures.  The skin  incisions were closed  subcuticular sutures of 4-0 Monocryl and Steri-  Strips.  Clean bandages were placed and the patient taken to the  recovery room in stable condition.  Estimated blood loss was about 10 mL  or less.  Complications were none.  Sponge, instrument, and needle  counts were correct.      Angelia Mould. Derrell Lolling, M.D.  Electronically Signed     HMI/MEDQ  D:  08/16/2007  T:  08/16/2007  Job:  [id number]

## 2011-04-21 NOTE — Op Note (Signed)
Shelly Lane, Shelly Lane            ACCOUNT NO.:  [id number]  MEDICAL RECORD NO.:  [id number]           PATIENT TYPE:  I  LOCATION:  1535                         FACILITY[facility]  [facility]  PHYSICIAN:  Ardeth Sportsman, MD     DATE OF BIRTH:  [birth year]  DATE OF PROCEDURE:  04/16/2011 DATE OF DISCHARGE:                              OPERATIVE REPORT   PRIMARY CARE PHYSICIAN:  [facility], Fleet Contras, MD  GASTROENTEROLOGIST:  Jordan Hawks. Elnoria Howard, MD, at [facility]  OPERATING SURGEON:  Ardeth Sportsman, MD  ASSISTANT:  Amber L. Freida Busman, MD  PREOPERATIVE DIAGNOSES: 1. Paraesophageal hiatal hernia. 2. Gastroesophageal reflux disease, on maximum medical therapy.  POSTOPERATIVE DIAGNOSES: 1. Paraesophageal hiatal hernia. 2. Gastroesophageal reflux disease, on maximum medical therapy.  PROCEDURE PERFORMED: 1. Laparoscopic paraesophageal hiatal hernia primary repair over     pledgets. 2. Laparoscopic Nissen fundoplication (2 cm x 56 French bougie).  ANESTHESIA: 1. General anesthesia. 2. Local anesthetic in a field block around all port sites.  SPECIMEN:  Anterior epiphrenic (not sent).  DRAINS:  None.  ESTIMATED BLOOD LOSS:  Minimal.  COMPLICATIONS:  None apparent.  INDICATIONS:  Shelly Lane is a 35 year old morbidly obese female who has been struggling with reflux since she has been a teenager.  She has been on numerous medications, has been on Dexilant and Carafate.  She has an elevated DeMeester score in the 50s.  She has tried numerous interventions and followed by Dr. Elnoria Howard for this.  She had manometry that showed no dysmotility and a gastric emptying scan that only showed mild delayed gastric emptying.  She was sent to me for consideration of surgical repair.  EGD showing hiatal hernia as well.  The anatomy and physiology of the digestive tract was explained.  Pathophysiology of gastroesophageal reflux disease and hiatal hernia was discussed.  Options  discussed and consideration of fundoplication was discussed with hiatal hernia repair.  Possible need for mesh, recurrence, prolonged dysphagia, leak, and other risks were discussed.  Questions answered and she agreed to proceed.  OPERATIVE FINDINGS:  She had a small hiatal hernia defect with about 3 cm of cardia up in the mediastinum.  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  DESCRIPTION OF PROCEDURE:  Informed procedure was confirmed.  The patient underwent general anesthesia without any difficulty.  She received IV cefazolin prior to incision.  She was positioned on a split- leg table with arms tucked.  Her abdomen was prepped and draped in a sterile fashion.  She had a Foley catheter sterilely placed.  Surgical time-out confirmed the plan.  I placed a #5 mm port in the left upper quadrant along the paramedian region using optical entry technique.  Entry was clean.  I induced carbon dioxide insufflation.  I saw no evidence of injury.  Under direct visualization, I placed a 5 mm  port in the left subxiphoid region,removed it, and used an omega-shaped rigid Nathanson liver retractor and used it to lift the left lateral sector of the liver anteriorly to expose the esophageal hiatus.  It was secured to the table using the Iron Man system.  I placed  another 5 mm port in the right upper quadrant, paramedian, just below the liver edge.  She had some adhesions in her left upper quadrant that I freed off.  These were to omentum.  Therefore, I placed a 10 mm port in the anterior axillary line along the left subcostal ridge.  I also placed a final port in the right midabdomen.  I turned attention towards the upper abdomen.  We were able to get through the hepatogastric ligament over the caudate lobe and ligate that.  I came around anteriorly, where it seemed rather thickened.  I skeletonized and took things anteriorly and could see a pulsatile vessel that was concerning  for replaced right hepatic vessel.  We skeletonized it, but left that alone.  I continued going more cephalad until I could identify the right crus and was able to enter the anterior mediastinum between the right crus and the esophagus.  I bluntly freed off the phrenoesophageal attachments along the right crus down a little bit towards its base.  I was able to come anteriorly and come around to identify the left crus.  I freed off some attachments between the medial left crus and the esophagus in the left mediastinum.  I ligated the short gastric vessels, starting about a quarter way down the greater curvature of the stomach, taking care to try and preserve the gastroepiploic vessels by staying a few centimeters off the greater curve.  With that, I could come over the fundus and around the cardia and rolled the stomach away.  The fundus of the stomach was moderately adherent to the spleen, but after carefully skeletonizing the anterior and posterior fold, I was able to free that off and help free the stomach off the left crus anterolaterally as well.  With that, I could free the phrenoesophageal attachments down the left crus towards the base.  I was able to come around and better identify the replaced hepatic vessels.  There was a branch going towards the cardia of the stomach medially on the proximal lesser curvature of the stomach.  We carefully skeletonized and isolated those and ligated those, keeping the main branch of the replaced right off.  With that, I could see the base of the right crus much better and complete dissection.  She had a small, but definite posterior mediastinal hiatal hernia sac and I was able to free that off the crus vessels and reduce that down.  With that, I could place the esophagus and stomach on axial tension and do a type II mediastinal dissection to free the distal half of the esophagus out of the mediastinum and help reduce it in, so I had over  3 cm of intraabdominal esophagus off axial tension.  I could easily see the anterior and posterior vagal nerves and kept these preserved at all times.  Hemostasis was good.  I brought the fundus of the stomach behind the cardia over to the right side to set up the right side of the wrap, did a classic shoeshine maneuver, to set up the wrap.  It seemed like it would come together well.  I rolled the stomach and esophagus towards the spleen in the left upper quadrant to expose the esophageal hiatus.  I reapproximated the esophageal hiatus primarily using 0-Ethibond horizontal mattress stitches with pledgets on each side.  I did this with extracorporeal knots.  I did 3 interrupted stitches.  Her left crus was strong and healthy, the right was a little thinned out but still  pretty viable. Because it was not a massive hiatal hernia and there was no tension and her crura were actually of pretty good quality, I did not feel that a biological mesh reinforcement was warranted and Dr. Freida Busman agreed.  When I was doing dissection, I did remove the anterior infraphrenic pad off the left anterior cardiac of the stomach as that was going to be involved in the wrap and I did not want to have that, tightening the wrap too much.  The posterior epiphrenic pad was thinned out and was able to be skeletonized and reflected more distally, so I would not be involved in the wrap.  Later, I did a posterior gastropexy by brining the right side of the wrap over again behind the cardia of the right side and did #1 Ethibond stitches to the posterior side of the right side of the wrap and to the crural closures, interrupted x2.  I did a left anterior gastropexy as well by taking the superior part of the left side of the wrap up the stomach and securing it to the apex of the left crus and tied that down. That helped set up the wrap well.  Anesthesia came in and passed a 56-French lit bougie transorally  without any difficulty with minimal axial tension.  This was done under direct visualization.  I, therefore, did a 3-stitch Nissen, taking a bite of the left side of the stomach wrap and the anterior esophagus on the right side of the wrap using 0-Ethibond stitches interrupted x3.  I measured it and it was 2 cm from most superior to most inferior stitch. This was done all on esophagus and not on cardia.  The wrap laid well. We did irrigation and hemostasis was excellent.  The liver looked viable.  There was no evidence of any ischemia or wedge infraction.  The hiatus was well closed.  I could easily pass a blunt laparoscopic grasper across the esophageal hiatus up in the mediastinum, implying that the closure was snug but not rigidly right.  I could also bring a grasper underneath the wrap of the Nissen , implying it was again a nice and floppy but not overly tight Nissen wrap.  Hemostasis was excellent and I removed the liver retractor under direct visualization and saw no bleeding and no abnormalities coming from the peritoneum of the skin and it was well away from the pericardium.  I evacuated carbon dioxide and removed the ports.  A 10 mm port in the left upper quadrant, the fascial defects laid above the ribs, so I did not do anymore aggressive closure.  We closed the skin, placed dressing. The patient was extubated and taken to recovery room in stable condition.  Per the patient's request, I am about to call her husband on phone, discuss postoperative findings.  I anticipate doing esophageal swallow in the morning. Make sure things are going well, and go from there.     Ardeth Sportsman, MD     SCG/MEDQ  D:  04/16/2011  T:  04/17/2011  Job:  [id number]  Electronically Signed by Karie Soda MD on 04/21/2011 12:20:59 PM

## 2011-04-24 NOTE — Op Note (Signed)
NAME:  Shelly Lane, Shelly Lane                      ACCOUNT NO.:  [id number]   MEDICAL RECORD NO.:  [id number]                   PATIENT TYPE:  INP   LOCATION:  9155                                 FACILITY:  [facility]   PHYSICIAN:  Osborn Coho, M.D.                DATE OF BIRTH:  Jul 07, 1976   DATE OF PROCEDURE:  02/19/2004  DATE OF DISCHARGE:                                 OPERATIVE REPORT   PREOPERATIVE DIAGNOSIS:  1. 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  2. Biophysical profile equivocal.  3. Desires testing for fetal lung maturity.   POSTOPERATIVE DIAGNOSIS:  1. 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  2. Biophysical profile equivocal.  3. Desires testing for fetal lung maturity.   PROCEDURE:  Amniocentesis.   ATTENDING PHYSICIAN:  Osborn Coho, M.D.   SPECIMENS:  Sent for LS ratio and PG.   The patient was taken to the ultrasound suite and prepped in normal sterile  fashion.  Positive FH confirmed prior to procedure.  The fluid pocket was  identified and the abdomen prepped and a total of 10 mL of lidocaine  injected.  At the site of injection, the needle was inserted for  amniocentesis and clear fluid returned.  The needle was removed.  The  patient was noted to have tolerated the procedure well.  The fluid was sent  for LS ratio and PG.  After the procedure, fetal heart rate was noted to be  150.                                               Osborn Coho, M.D.    AR/MEDQ  D:  02/19/2004  T:  02/20/2004  Job:  [id number]
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
## 2011-04-24 NOTE — Discharge Summary (Signed)
NAMELUSINE, CORLETT                        ACCOUNT NO.:  [id number]   MEDICAL RECORD NO.:  [id number]                   PATIENT TYPE:  INP   LOCATION:  9121                                 FACILITY:  [facility]   PHYSICIAN:  Hal Morales, M.D.             DATE OF BIRTH:  26-Jan-1976   DATE OF ADMISSION:  08/09/2002  DATE OF DISCHARGE:  08/12/2002                                 DISCHARGE SUMMARY   ADMISSION DIAGNOSES:  1. Intrauterine pregnancy at term.  2. Spontaneous rupture of membranes.  3. Early labor.   DISCHARGE DIAGNOSES:  1. Intrauterine pregnancy at term.  2. Vacuum extraction vaginal birth.   PROCEDURE:  1. Vacuum extraction.  2. Repair of first-degree laceration.   HOSPITAL COURSE:  The patient is a 35 year old single black female gravida  4, para 2-0-1-2 at 40-5/7 weeks who presented with a spontaneous rupture of  membranes and early labor on August 09, 2002.  The pregnancy had been  remarkable for (1) history of preterm labor with full-term delivery, (2)  positive group B strep, (3) history of migraines, (4) history of GERD.  The  patient was admitted.  She was contracting every 3-5 minutes although after  a few hours her contractions had spaced out and Pitocin was started to  achieve adequate labor.  Upon admission, she was 4 cm dilated at  approximately 6 a.m.  By 4:30 p.m., she was approximately 6 cm dilated and  Pitocin was at 10 U/mL per minute.  At approximately 7 p.m., she was having  some repetitive variable decelerations, late onset, following each  contraction.  Cervical exam was 9 cm and the patient began pushing to see if  her cervix could be reduced.  The fetal heart rate tracing improved and she  stopped pushing because the cervix was remaining at 9 cm.  At approximately  9:45 p.m., her cervix was completely dilated with vertex and +1 station and  she began pushing.  After approximately an hour and 1/2, vertex had not  descended and caput was  developing and there was questionable OP  presentation.  The patient rested for approximately 45 minutes with PCA  epidural dosing.  After resting, she resumed pushing and vertex did not  descend any more.  Dr. Estanislado Pandy was consulted and requested to come in for  evaluation.  After placing a pudendal block, Dr. Estanislado Pandy was better able to  evaluate fetal presentation which was determined to be ROP with asynclitism.  Vacuum was applied and rotation to ROA position was easily accomplished with  the first push.  Then her vacuum was removed and the patient vaginally  delivered a viable female infant by the name of Cleotis Lema whose Apgars were 8  at one-minute and 9 at five minutes, and he weighed 8 pounds 15 ounces.  Immediately after spontaneous placental delivery, there was approximately  600 cc gush of blood.  Her uterine atony responded to  massage and Pitocin  administration.  Estimated blood loss was 800 cc.  By postpartum day #1, the  patient was doing well except for right groin pain.  It was determined that  the pain was most likely from pushing.  Her hemoglobin was 7.9 and it had  been at 10.9 upon admission to the hospital and 9.1 approximately 10 hours  after delivery.  Her orthostatic blood pressure and pulses were within  normal limits.  The patient reported occasional dizziness when out of bed.  By postpartum day #2, the patient was doing well and was ready to go home.  Breast feeding was going well.  She still had occasional slight dizziness  and tiredness especially with breast feeding.  She reported ambulating slow  secondary to her groin pain which was moderately relieved with heat  application and Motrin.  The patient plans a ParaGard IUD to be placed in  the postpartum period.  The patient was doing well, was deemed to receive  the full benefit of her hospital stay and she was discharged home.   DISCHARGE INSTRUCTIONS:  Per [facility] handout.   DISCHARGE MEDICATIONS:   1. Motrin 600 mg p.o. q.6 h. p.r.n. pain.  2. Tylox 1-2 p.o. q.3-4 h. p.r.n. pain.  3. Prenatal vitamin one p.o. q.d.  4. Hemocyte one p.o. b.i.d.   FOLLOW UP:  Discharge followup will occur at six weeks postpartum at [facility].     Cam Hai, C.N.M.                     Hal Morales, M.D.    KS/MEDQ  D:  08/12/2002  T:  08/12/2002  Job:  [id number]

## 2011-04-24 NOTE — Discharge Summary (Signed)
NAME:  Shelly Lane, TORTORELLA                      ACCOUNT NO.:  [id number]   MEDICAL RECORD NO.:  [id number]                   PATIENT TYPE:  INP   LOCATION:  9103                                 FACILITY:  [facility]   PHYSICIAN:  Janine Limbo, M.D.            DATE OF BIRTH:  July 05, 1976   DATE OF ADMISSION:  02/18/2004  DATE OF DISCHARGE:  02/23/2004                                 DISCHARGE SUMMARY   ADMISSION DIAGNOSES:  1. Intrauterine pregnancy at 64 and two-sevenths weeks.  2. Insulin-dependent gestational diabetes.  3. Decreased biophysical profile.  4. Unfavorable cervix.   DISCHARGE DIAGNOSES:  1. Intrauterine pregnancy at 63 and two-sevenths weeks.  2. Insulin-dependent gestational diabetes.  3. Decreased biophysical profile.  4. Unfavorable cervix.  5. Status post spontaneous vaginal delivery.  6. Desiring Depo-Provera for contraception.  7. Breast feeding.   PROCEDURES THIS ADMISSION:  1. Amniocentesis for fetal lung maturity.  2. Normal spontaneous vaginal delivery of a viable female infant on February 21, 2004 attended by Dr. Estanislado Pandy.   HOSPITAL COURSE:  [name] is a 35 year old female gravida 5 para 3-0-  1-3 at [redacted] weeks gestation who was admitted for observation initially  secondary to a biophysical profile on February 18, 2004 that had a score of  2/8.  The plan was to repeat the biophysical profile which was done and on  day of admission it was still 4/8.  On the day subsequent to admission she  had another biophysical profile that was more reassuring; however, due to  the previous day's scores the patient wanted to be induced.  She then  underwent amniocentesis for fetal lung maturity and awaited those results.  They were positive and she desired to proceed with induction of labor which  was started with Cytotec and Pitocin.  The Cytotec was inserted on the  evening of February 20, 2004 and Pitocin was started on the morning of February 21, 2004.  She  progressed to delivery by noon on February 21, 2004 of a viable  female infant named Rutha Bouchard who weighed 6 pounds 9 ounces and had Apgars of 9  and 9 attending in delivery by Dr. Dois Davenport Rivard.  Postpartally she did  well.  She was ambulating, voiding, and eating without difficulty.  She was  breast feeding also without difficulty.  Her blood sugars normalized and she  desired Depo-Provera for contraception.  She was deemed ready for discharge  on February 23, 2004.  She was discharged home with instructions per the  [facility] handout.  She was given Depo-Provera 150 mg IM prior  to discharge and prescriptions for Motrin and Tylox.   DISCHARGE LABORATORY DATA:  Her hemoglobin was 10.0.   FOLLOW-UP:  Her discharge follow-up was to be in 4-6 weeks at [facility] or p.r.n.     Concha Pyo. Duplantis, C.N.M.  Janine Limbo, M.D.    SJD/MEDQ  D:  03/17/2004  T:  03/17/2004  Job:  [id number]

## 2011-09-03 LAB — POCT RAPID STREP A: Streptococcus, Group A Screen (Direct): NEGATIVE

## 2011-09-04 LAB — URINALYSIS, ROUTINE W REFLEX MICROSCOPIC
Bilirubin Urine: NEGATIVE
Glucose, UA: NEGATIVE
Hgb urine dipstick: NEGATIVE
Ketones, ur: NEGATIVE
Nitrite: NEGATIVE
Protein, ur: NEGATIVE
Specific Gravity, Urine: 1.02
Urobilinogen, UA: 0.2
pH: 6.5

## 2011-09-04 LAB — GC/CHLAMYDIA PROBE AMP, GENITAL
Chlamydia, DNA Probe: NEGATIVE
GC Probe Amp, Genital: NEGATIVE

## 2011-09-04 LAB — POCT PREGNANCY, URINE: Preg Test, Ur: POSITIVE

## 2011-09-04 LAB — WET PREP, GENITAL
Clue Cells Wet Prep HPF POC: NONE SEEN
Trich, Wet Prep: NONE SEEN
Yeast Wet Prep HPF POC: NONE SEEN

## 2011-09-07 LAB — GC/CHLAMYDIA PROBE AMP, GENITAL
Chlamydia, DNA Probe: NEGATIVE
GC Probe Amp, Genital: NEGATIVE

## 2011-09-07 LAB — URINALYSIS, ROUTINE W REFLEX MICROSCOPIC
Bilirubin Urine: NEGATIVE
Glucose, UA: NEGATIVE
Ketones, ur: NEGATIVE
Nitrite: NEGATIVE
Protein, ur: NEGATIVE
Specific Gravity, Urine: 1.025
Urobilinogen, UA: 0.2
pH: 6

## 2011-09-07 LAB — WET PREP, GENITAL
Clue Cells Wet Prep HPF POC: NONE SEEN
Yeast Wet Prep HPF POC: NONE SEEN

## 2011-09-07 LAB — URINE MICROSCOPIC-ADD ON

## 2011-09-08 LAB — URINALYSIS, ROUTINE W REFLEX MICROSCOPIC
Bilirubin Urine: NEGATIVE
Glucose, UA: NEGATIVE
Hgb urine dipstick: NEGATIVE
Ketones, ur: 15 — AB
Nitrite: NEGATIVE
Protein, ur: NEGATIVE
Specific Gravity, Urine: >1.03 — ABNORMAL HIGH
Urobilinogen, UA: 0.2
pH: 6

## 2011-09-08 LAB — CBC
HCT: 35.2 — ABNORMAL LOW
Hemoglobin: 11.7 — ABNORMAL LOW
MCHC: 33.3
MCV: 88.2
Platelets: 227
RBC: 3.99
RDW: 14.2
WBC: 11.9 — ABNORMAL HIGH

## 2011-09-08 LAB — DIFFERENTIAL
Basophils Absolute: 0
Basophils Relative: 0
Eosinophils Absolute: 0.1
Eosinophils Relative: 1
Lymphocytes Relative: 23
Lymphs Abs: 2.8
Monocytes Absolute: 0.6
Monocytes Relative: 5
Neutro Abs: 8.4 — ABNORMAL HIGH
Neutrophils Relative %: 71

## 2011-09-08 LAB — COMPREHENSIVE METABOLIC PANEL
ALT: 13
AST: 16
Albumin: 3 — ABNORMAL LOW
Alkaline Phosphatase: 39
BUN: 6
CO2: 25
Calcium: 9.1
Chloride: 107
Creatinine, Ser: 0.53
GFR calc Af Amer: >60
GFR calc non Af Amer: >60
Glucose, Bld: 90
Potassium: 3.6
Sodium: 138
Total Bilirubin: 0.3
Total Protein: 6.1

## 2011-09-11 LAB — URINALYSIS, ROUTINE W REFLEX MICROSCOPIC
Glucose, UA: 100 — AB
Hgb urine dipstick: NEGATIVE
Ketones, ur: 15 — AB
Leukocytes, UA: NEGATIVE
Nitrite: POSITIVE — AB
Protein, ur: 100 — AB
Specific Gravity, Urine: 1.02
Urobilinogen, UA: 1
pH: 7

## 2011-09-11 LAB — DIFFERENTIAL
Basophils Absolute: 0.1
Basophils Relative: 1
Eosinophils Absolute: 0.1
Eosinophils Relative: 1
Lymphocytes Relative: 41
Lymphs Abs: 4.4 — ABNORMAL HIGH
Monocytes Absolute: 0.8
Monocytes Relative: 7
Neutro Abs: 5.5
Neutrophils Relative %: 50

## 2011-09-11 LAB — BASIC METABOLIC PANEL
BUN: 2 — ABNORMAL LOW
CO2: 28
Calcium: 9
Chloride: 102
Creatinine, Ser: 0.7
GFR calc Af Amer: >60
GFR calc non Af Amer: >60
Glucose, Bld: 121 — ABNORMAL HIGH
Potassium: 3.2 — ABNORMAL LOW
Sodium: 136

## 2011-09-11 LAB — CBC
HCT: 38.5
Hemoglobin: 13
MCHC: 33.8
MCV: 84.1
Platelets: 281
RBC: 4.58
RDW: 12.5
WBC: 10.9 — ABNORMAL HIGH

## 2011-09-11 LAB — URINE MICROSCOPIC-ADD ON: RBC / HPF: NONE SEEN

## 2011-09-11 LAB — WET PREP, GENITAL
Clue Cells Wet Prep HPF POC: NONE SEEN
Trich, Wet Prep: NONE SEEN
Yeast Wet Prep HPF POC: NONE SEEN

## 2011-09-11 LAB — GC/CHLAMYDIA PROBE AMP, GENITAL
Chlamydia, DNA Probe: NEGATIVE
GC Probe Amp, Genital: NEGATIVE

## 2011-09-16 LAB — URINALYSIS, ROUTINE W REFLEX MICROSCOPIC
Bilirubin Urine: NEGATIVE
Glucose, UA: NEGATIVE
Hgb urine dipstick: NEGATIVE
Ketones, ur: NEGATIVE
Nitrite: NEGATIVE
Protein, ur: NEGATIVE
Specific Gravity, Urine: >1.03 — ABNORMAL HIGH
Urobilinogen, UA: 0.2
pH: 6

## 2011-09-16 LAB — POCT PREGNANCY, URINE
Operator id: 127531
Preg Test, Ur: NEGATIVE

## 2011-09-16 LAB — HCG, SERUM, QUALITATIVE: Preg, Serum: NEGATIVE

## 2011-09-18 LAB — CBC
HCT: 33.6 — ABNORMAL LOW
HCT: 38
Hemoglobin: 11.4 — ABNORMAL LOW
Hemoglobin: 12.8
MCHC: 33.8
MCHC: 33.9
MCV: 82.4
MCV: 82.9
Platelets: 255
Platelets: 308
RBC: 4.05
RBC: 4.61
RDW: 13.3
RDW: 15.2 — ABNORMAL HIGH
WBC: 7
WBC: 8.2

## 2011-09-18 LAB — BASIC METABOLIC PANEL
BUN: 3 — ABNORMAL LOW
BUN: 3 — ABNORMAL LOW
BUN: 4 — ABNORMAL LOW
BUN: 7
CO2: 27
CO2: 29
CO2: 31
CO2: 32
Calcium: 8.3 — ABNORMAL LOW
Calcium: 8.3 — ABNORMAL LOW
Calcium: 8.5
Calcium: 8.7
Chloride: 103
Chloride: 105
Chloride: 107
Chloride: 108
Creatinine, Ser: 0.64
Creatinine, Ser: 0.75
Creatinine, Ser: 0.76
Creatinine, Ser: 0.92
GFR calc Af Amer: >60
GFR calc Af Amer: >60
GFR calc Af Amer: >60
GFR calc Af Amer: >60
GFR calc non Af Amer: >60
GFR calc non Af Amer: >60
GFR calc non Af Amer: >60
GFR calc non Af Amer: >60
Glucose, Bld: 100 — ABNORMAL HIGH
Glucose, Bld: 136 — ABNORMAL HIGH
Glucose, Bld: 141 — ABNORMAL HIGH
Glucose, Bld: 98
Potassium: 2.7 — CL
Potassium: 2.8 — ABNORMAL LOW
Potassium: 2.9 — ABNORMAL LOW
Potassium: 3.2 — ABNORMAL LOW
Sodium: 139
Sodium: 139
Sodium: 141
Sodium: 142

## 2011-09-18 LAB — URINALYSIS, ROUTINE W REFLEX MICROSCOPIC
Bilirubin Urine: NEGATIVE
Bilirubin Urine: NEGATIVE
Glucose, UA: NEGATIVE
Glucose, UA: NEGATIVE
Hgb urine dipstick: NEGATIVE
Ketones, ur: NEGATIVE
Ketones, ur: NEGATIVE
Leukocytes, UA: NEGATIVE
Nitrite: NEGATIVE
Nitrite: NEGATIVE
Protein, ur: NEGATIVE
Protein, ur: NEGATIVE
Specific Gravity, Urine: 1.011
Specific Gravity, Urine: 1.016
Urobilinogen, UA: 0.2
Urobilinogen, UA: 0.2
pH: 6
pH: 7.5

## 2011-09-18 LAB — NA AND K (SODIUM & POTASSIUM), RAND UR
Potassium Urine: 29
Potassium Urine: 7
Sodium, Ur: 154
Sodium, Ur: 47

## 2011-09-18 LAB — COMPREHENSIVE METABOLIC PANEL
ALT: 18
ALT: 26
ALT: 35
AST: 18
AST: 24
AST: 41 — ABNORMAL HIGH
Albumin: 2.6 — ABNORMAL LOW
Albumin: 3.3 — ABNORMAL LOW
Albumin: 3.5
Alkaline Phosphatase: 54
Alkaline Phosphatase: 59
Alkaline Phosphatase: 67
BUN: 4 — ABNORMAL LOW
BUN: 6
BUN: 7
CO2: 23
CO2: 35 — ABNORMAL HIGH
CO2: 35 — ABNORMAL HIGH
Calcium: 8.5
Calcium: 8.8
Calcium: 8.9
Chloride: 103
Chloride: 106
Chloride: 89 — ABNORMAL LOW
Creatinine, Ser: 0.8
Creatinine, Ser: 0.83
Creatinine, Ser: 0.95
GFR calc Af Amer: >60
GFR calc Af Amer: >60
GFR calc Af Amer: >60
GFR calc non Af Amer: >60
GFR calc non Af Amer: >60
GFR calc non Af Amer: >60
Glucose, Bld: 113 — ABNORMAL HIGH
Glucose, Bld: 115 — ABNORMAL HIGH
Glucose, Bld: 89
Potassium: 2.4 — CL
Potassium: 2.5 — CL
Potassium: 4.1
Sodium: 135
Sodium: 136
Sodium: 141
Total Bilirubin: 0.7
Total Bilirubin: 0.8
Total Bilirubin: 1.5 — ABNORMAL HIGH
Total Protein: 5.6 — ABNORMAL LOW
Total Protein: 6.5
Total Protein: 7.3

## 2011-09-18 LAB — DIFFERENTIAL
Basophils Absolute: 0
Basophils Absolute: 0
Basophils Relative: 0
Basophils Relative: 0
Eosinophils Absolute: 0.1
Eosinophils Absolute: 0.1
Eosinophils Relative: 1
Eosinophils Relative: 1
Lymphocytes Relative: 26
Lymphocytes Relative: 29
Lymphs Abs: 2
Lymphs Abs: 2.1
Monocytes Absolute: 0.4
Monocytes Absolute: 0.4
Monocytes Relative: 5
Monocytes Relative: 6
Neutro Abs: 4.4
Neutro Abs: 5.6
Neutrophils Relative %: 64
Neutrophils Relative %: 68

## 2011-09-18 LAB — LIPASE, BLOOD: Lipase: 33

## 2011-09-18 LAB — I-STAT 8, (EC8 V) (CONVERTED LAB)
Acid-Base Excess: 17 — ABNORMAL HIGH
BUN: 7
Bicarbonate: 41.5 — ABNORMAL HIGH
Chloride: 93 — ABNORMAL LOW
Glucose, Bld: 121 — ABNORMAL HIGH
HCT: 42
Hemoglobin: 14.3
Operator id: 294521
Potassium: 2.3 — CL
Sodium: 134 — ABNORMAL LOW
TCO2: 43
pCO2, Ven: 48.5
pH, Ven: 7.541 — ABNORMAL HIGH

## 2011-09-18 LAB — CK: Total CK: 206 — ABNORMAL HIGH

## 2011-09-18 LAB — URINE MICROSCOPIC-ADD ON

## 2011-09-18 LAB — PREGNANCY, URINE
Preg Test, Ur: NEGATIVE
Preg Test, Ur: NEGATIVE

## 2011-09-18 LAB — MAGNESIUM
Magnesium: 2.2
Magnesium: 2.2

## 2011-09-18 LAB — CHLORIDE, URINE, RANDOM: Chloride Urine: 19

## 2011-09-18 LAB — HCG, SERUM, QUALITATIVE
Preg, Serum: NEGATIVE
Preg, Serum: NEGATIVE

## 2011-09-18 LAB — OSMOLALITY, URINE: Osmolality, Ur: 571

## 2011-09-18 LAB — T4, FREE: Free T4: 1.3

## 2011-09-18 LAB — OSMOLALITY: Osmolality: 281

## 2011-09-18 LAB — PHOSPHORUS
Phosphorus: 2.6
Phosphorus: 3.1

## 2011-09-18 LAB — POCT I-STAT CREATININE
Creatinine, Ser: 0.9
Operator id: 294521

## 2011-09-18 LAB — TSH: TSH: 2.723

## 2011-11-23 ENCOUNTER — Encounter (HOSPITAL_COMMUNITY): Payer: Self-pay | Admitting: *Deleted

## 2011-11-23 ENCOUNTER — Inpatient Hospital Stay (HOSPITAL_COMMUNITY)
Admission: AD | Admit: 2011-11-23 | Discharge: 2011-11-23 | Disposition: A | Discharge status: Home / Self Care | Payer: Medicaid Other | Source: Ambulatory Visit | Attending: Obstetrics and Gynecology | Admitting: Obstetrics and Gynecology

## 2011-11-23 ENCOUNTER — Encounter: Payer: Self-pay | Admitting: Obstetrics and Gynecology

## 2011-11-23 DIAGNOSIS — O2650 Maternal hypotension syndrome, unspecified trimester: Secondary | ICD-10-CM | POA: Insufficient documentation

## 2011-11-23 LAB — COMPREHENSIVE METABOLIC PANEL
ALT: 16 U/L (ref 0–35)
AST: 19 U/L (ref 0–37)
Albumin: 3.5 g/dL (ref 3.5–5.2)
Alkaline Phosphatase: 79 U/L (ref 39–117)
BUN: 9 mg/dL (ref 6–23)
CO2: 25 mEq/L (ref 19–32)
Calcium: 9.5 mg/dL (ref 8.4–10.5)
Chloride: 103 mEq/L (ref 96–112)
Creatinine, Ser: 0.56 mg/dL (ref 0.50–1.10)
GFR calc Af Amer: >90 mL/min (ref >90)
GFR calc non Af Amer: >90 mL/min (ref >90)
Glucose, Bld: 94 mg/dL (ref 70–99)
Potassium: 4 mEq/L (ref 3.5–5.1)
Sodium: 135 mEq/L (ref 135–145)
Total Bilirubin: 0.1 mg/dL — ABNORMAL LOW (ref 0.3–1.2)
Total Protein: 7 g/dL (ref 6.0–8.3)

## 2011-11-23 LAB — URINALYSIS, ROUTINE W REFLEX MICROSCOPIC
Bilirubin Urine: NEGATIVE
Glucose, UA: NEGATIVE mg/dL
Hgb urine dipstick: NEGATIVE
Ketones, ur: NEGATIVE mg/dL
Nitrite: NEGATIVE
Protein, ur: NEGATIVE mg/dL
Specific Gravity, Urine: 1.025 (ref 1.005–1.030)
Urobilinogen, UA: 0.2 mg/dL (ref 0.0–1.0)
pH: 6 (ref 5.0–8.0)

## 2011-11-23 LAB — URINE MICROSCOPIC-ADD ON

## 2011-11-23 LAB — POCT PREGNANCY, URINE: Preg Test, Ur: POSITIVE

## 2011-11-23 LAB — CBC
HCT: 36.2 % (ref 36.0–46.0)
Hemoglobin: 12.1 g/dL (ref 12.0–15.0)
MCH: 28.4 pg (ref 26.0–34.0)
MCHC: 33.4 g/dL (ref 30.0–36.0)
MCV: 85 fL (ref 78.0–100.0)
Platelets: 247 10*3/uL (ref 150–400)
RBC: 4.26 MIL/uL (ref 3.87–5.11)
RDW: 14.2 % (ref 11.5–15.5)
WBC: 9 10*3/uL (ref 4.0–10.5)

## 2011-11-23 LAB — HCG, QUANTITATIVE, PREGNANCY: hCG, Beta Chain, Quant, S: 226 m[IU]/mL — ABNORMAL HIGH (ref <5)

## 2011-11-23 LAB — GLUCOSE, CAPILLARY: Glucose-Capillary: 103 mg/dL — ABNORMAL HIGH (ref 70–99)

## 2011-11-23 NOTE — ED Provider Notes (Signed)
History     Chief Complaint  Patient presents with  . Loss of Consciousness   HPI Comments: Pt is a G8P6 at [redacted]w[redacted]d with LMP of 11/17. Reports having a +UPT at home on Thursday 12/13. She reports that she tried to get out of bed tonight and felt faint "saw stars" and fell to floor, denies any injury, unsure how long she was out. Husband was asleep so did not witness the fall. States she's fainted a lot in the past. Is feeling overwhelmed already, has 2 "babies" at home.   Loss of Consciousness Associated symptoms include back pain, dizziness and weakness.      Past Medical History  Diagnosis Date  . No pertinent past medical history     Past Surgical History  Procedure Date  . Cholecystectomy 2008  . Hiatal hernia repair 2012    Family History  Problem Relation Age of Onset  . Diabetes Maternal Grandmother   . Hypertension Maternal Grandmother     History  Substance Use Topics  . Smoking status: Never Smoker   . Smokeless tobacco: Not on file  . Alcohol Use: No    Allergies:  Allergies  Allergen Reactions  . Codeine Itching    Prescriptions prior to admission  Medication Sig Dispense Refill  . Cyanocobalamin (VITAMIN B 12) 100 MCG LOZG Take 1 lozenge by mouth daily.        . Multiple Vitamins-Calcium (ONE-A-DAY WOMENS PO) Take 1 tablet by mouth daily.          Review of Systems  HENT: Negative.   Respiratory: Negative.   Cardiovascular: Negative.   Gastrointestinal: Negative.   Genitourinary: Negative.   Musculoskeletal: Positive for back pain and falls.       Hx of back pain, states it has been more persistent last 2 days.   Skin: Negative.   Neurological: Positive for dizziness, loss of consciousness and weakness.       When waking up or moving too fast ?LOC  Psychiatric/Behavioral: Negative.   All other systems reviewed and are negative.   Physical Exam   Blood pressure 122/61, pulse 98, resp. rate 18, height 5\' 6"  (1.676 m), weight 117.028 kg (258  lb), last menstrual period 10/24/2011, SpO2 99.00%.  Physical Exam  Nursing note and vitals reviewed. Constitutional: She is oriented to person, place, and time. She appears well-developed and well-nourished. No distress.       Morbidly obese  Eyes: Pupils are equal, round, and reactive to light.  Neck: Normal range of motion.  Cardiovascular: Normal rate and normal heart sounds.   Respiratory: Effort normal and breath sounds normal.  GI: Soft. She exhibits no distension. There is no tenderness.  Musculoskeletal: Normal range of motion. She exhibits no edema and no tenderness.  Neurological: She is alert and oriented to person, place, and time. She has normal reflexes.  Skin: Skin is warm and dry.  Psychiatric: She has a normal mood and affect. Her behavior is normal. Judgment and thought content normal.   Pelvic deferred Results for orders placed during the hospital encounter of 11/23/11 (from the past 24 hour(s))  URINALYSIS, ROUTINE W REFLEX MICROSCOPIC     Status: Abnormal   Collection Time   11/23/11  3:00 AM      Component Value Range   Color, Urine YELLOW  YELLOW    APPearance CLEAR  CLEAR    Specific Gravity, Urine 1.025  1.005 - 1.030    pH 6.0  5.0 - 8.0  Glucose, UA NEGATIVE  NEGATIVE (mg/dL)   Hgb urine dipstick NEGATIVE  NEGATIVE    Bilirubin Urine NEGATIVE  NEGATIVE    Ketones, ur NEGATIVE  NEGATIVE (mg/dL)   Protein, ur NEGATIVE  NEGATIVE (mg/dL)   Urobilinogen, UA 0.2  0.0 - 1.0 (mg/dL)   Nitrite NEGATIVE  NEGATIVE    Leukocytes, UA TRACE (*) NEGATIVE   URINE MICROSCOPIC-ADD ON     Status: Abnormal   Collection Time   11/23/11  3:00 AM      Component Value Range   Squamous Epithelial / LPF FEW (*) RARE    WBC, UA 0-2  <3 (WBC/hpf)   Bacteria, UA RARE  RARE   POCT PREGNANCY, URINE     Status: Normal   Collection Time   11/23/11  3:10 AM      Component Value Range   Preg Test, Ur POSITIVE    CBC     Status: Normal   Collection Time   11/23/11  3:47 AM        Component Value Range   WBC 9.0  4.0 - 10.5 (K/uL)   RBC 4.26  3.87 - 5.11 (MIL/uL)   Hemoglobin 12.1  12.0 - 15.0 (g/dL)   HCT 16.1  09.6 - 04.5 (%)   MCV 85.0  78.0 - 100.0 (fL)   MCH 28.4  26.0 - 34.0 (pg)   MCHC 33.4  30.0 - 36.0 (g/dL)   RDW 40.9  81.1 - 91.4 (%)   Platelets 247  150 - 400 (K/uL)  GLUCOSE, CAPILLARY     Status: Abnormal   Collection Time   11/23/11  3:58 AM      Component Value Range   Glucose-Capillary 103 (*) 70 - 99 (mg/dL)    MAU Course  Procedures   Assessment and Plan  35yo G8P6 at6 [redacted]w[redacted]d Syncopal episode Hemoconcentrated  CBC - normal CMET pending QHCG pending UA - dehydrated, otherwise normal  Await cmet and qhcg Check orthostatic vitals Emphasized importance of monitoring for syncope and changing positions slowly Also eating small protein snacks every 2 hours and drinking 8-10 glasses of water daily She will f/u at [facility] for NOB visit in the 6-8 weeks.     Jamaurie Bernier M 11/23/2011, 4:27 AM   12/17 at 0522   CMET normal QHCG =227  tol PO  D/C home

## 2011-11-23 NOTE — Progress Notes (Signed)
Pt reports she "fainted" at about 2 am. States she has a history of fainting a lot (last time was one month ago). States that since she fainted her left arm and hand feels "like it has been burned". Denies vomiting. States she had a positive home preg test on Thursday

## 2011-12-23 ENCOUNTER — Encounter (HOSPITAL_COMMUNITY): Payer: Self-pay | Admitting: *Deleted

## 2011-12-23 ENCOUNTER — Inpatient Hospital Stay (HOSPITAL_COMMUNITY): Payer: Medicaid Other

## 2011-12-23 ENCOUNTER — Inpatient Hospital Stay (HOSPITAL_COMMUNITY)
Admission: AD | Admit: 2011-12-23 | Discharge: 2011-12-23 | Disposition: A | Discharge status: Home / Self Care | Payer: Medicaid Other | Source: Ambulatory Visit | Attending: Obstetrics and Gynecology | Admitting: Obstetrics and Gynecology

## 2011-12-23 DIAGNOSIS — O26859 Spotting complicating pregnancy, unspecified trimester: Secondary | ICD-10-CM | POA: Insufficient documentation

## 2011-12-23 DIAGNOSIS — O200 Threatened abortion: Secondary | ICD-10-CM

## 2011-12-23 DIAGNOSIS — E669 Obesity, unspecified: Secondary | ICD-10-CM | POA: Insufficient documentation

## 2011-12-23 DIAGNOSIS — O4690 Antepartum hemorrhage, unspecified, unspecified trimester: Secondary | ICD-10-CM

## 2011-12-23 LAB — URINE MICROSCOPIC-ADD ON

## 2011-12-23 LAB — URINALYSIS, ROUTINE W REFLEX MICROSCOPIC
Bilirubin Urine: NEGATIVE
Glucose, UA: NEGATIVE mg/dL
Ketones, ur: NEGATIVE mg/dL
Leukocytes, UA: NEGATIVE
Nitrite: NEGATIVE
Protein, ur: NEGATIVE mg/dL
Specific Gravity, Urine: 1.015 (ref 1.005–1.030)
Urobilinogen, UA: 0.2 mg/dL (ref 0.0–1.0)
pH: 6.5 (ref 5.0–8.0)

## 2011-12-23 LAB — GC/CHLAMYDIA PROBE AMP, GENITAL
Chlamydia, DNA Probe: NEGATIVE
GC Probe Amp, Genital: NEGATIVE

## 2011-12-23 LAB — WET PREP, GENITAL
Clue Cells Wet Prep HPF POC: NONE SEEN
Trich, Wet Prep: NONE SEEN
Yeast Wet Prep HPF POC: NONE SEEN

## 2011-12-23 MED ORDER — ACETAMINOPHEN 500 MG PO TABS
1000.0000 mg | ORAL_TABLET | Freq: Once | ORAL | Status: AC | PRN
  Administered 2011-12-23: 1000 mg via ORAL
  Filled 2011-12-23: qty 2

## 2011-12-23 NOTE — Progress Notes (Signed)
Notified of pt presenting for spotting and cramping.  Will place orders and be up to see pt.

## 2011-12-23 NOTE — Discharge Instructions (Signed)
Threatened Miscarriage Bleeding during the first 20 weeks of pregnancy is common. This is sometimes called a threatened miscarriage. This is a pregnancy that is threatening to end before the twentieth week of pregnancy. Often this bleeding stops with bed rest or decreased activities as suggested by your caregiver and the pregnancy continues without any more problems. You may be asked to not have sexual intercourse, have orgasms or use tampons until further notice. Sometimes a threatened miscarriage can progress to a complete or incomplete miscarriage. This may or may not require further treatment. Some miscarriages occur before a woman misses a menstrual period and knows she is pregnant. Miscarriages occur in 15 to 20% of all pregnancies and usually occur during the first 13 weeks of the pregnancy. The exact cause of a miscarriage is usually never known. A miscarriage is natures way of ending a pregnancy that is abnormal or would not make it to term. There are some things that may put you at risk to have a miscarriage, such as:  Hormone problems.   Infection of the uterus or cervix.   Chronic illness, diabetes for example, especially if it is not controlled.   Abnormal shaped uterus.   Fibroids in the uterus.   Incompetent cervix (the cervix is too weak to hold the baby).   Smoking.   Drinking too much alcohol. It's best not to drink any alcohol when you are pregnant.   Taking illegal drugs.  TREATMENT  When a miscarriage becomes complete and all products of conception (all the tissue in the uterus) have been passed, often no treatment is needed. If you think you passed tissue, save it in a container and take it to your doctor for evaluation. If the miscarriage is incomplete (parts of the fetus or placenta remain in the uterus), further treatment may be needed. The most common reason for further treatment is continued bleeding (hemorrhage) because pregnancy tissue did not pass out of the  uterus. This often occurs if a miscarriage is incomplete. Tissue left behind may also become infected. Treatment usually is dilatation and curettage (the removal of the remaining products of pregnancy. This can be done by a simple sucking procedure (suction curettage) or a simple scraping of the inside of the uterus. This may be done in the hospital or in the caregiver's office. This is only done when your caregiver knows that there is no chance for the pregnancy to proceed to term. This is determined by physical examination, negative pregnancy test, falling pregnancy hormone count and/or, an ultrasound revealing a dead fetus. Miscarriages are often a very emotional time for prospective mothers and fathers. This is not you or your partners fault. It did not occur because of an inadequacy in you or your partner. Nearly all miscarriages occur because the pregnancy has started off wrongly. At least half of these pregnancies have a chromosomal abnormality. It is almost always not inherited. Others may have developmental problems with the fetus or placenta. This does not always show up even when the products miscarried are studied under the microscope. The miscarriage is nearly always not your fault and it is not likely that you could have prevented it from happening. If you are having emotional and grieving problems, talk to your health care provider and even seek counseling, if necessary, before getting pregnant again. You can begin trying for another pregnancy as soon as your caregiver says it is OK. HOME CARE INSTRUCTIONS   Your caregiver may order bed rest depending on how much bleeding   and cramping you are having. You may be limited to only getting up to go to the bathroom. You may be allowed to continue light activity. You may need to make arrangements for the care of your other children and for any other responsibilities.   Keep track of the number of pads you use each day, how often you have to change pads  and how saturated (soaked) they are. Record this information.   DO NOT USE TAMPONS. Do not douche, have sexual intercourse or orgasms until approved by your caregiver.   You may receive a follow up appointment for re-evaluation of your pregnancy and a repeat blood test. Re-evaluation often occurs after 2 days and again in 4 to 6 weeks. It is very important that you follow-up in the recommended time period.   If you are Rh negative and the father is Rh positive or you do not know the fathers' blood type, you may receive a shot (Rh immune globulin) to help prevent abnormal antibodies that can develop and affect the baby in any future pregnancies.  SEEK IMMEDIATE MEDICAL CARE IF:  You have severe cramps in your stomach, back, or abdomen.   You have a sudden onset of severe pain in the lower part of your abdomen.   You develop chills.   You run an unexplained temperature of 101 F (38.3 C) or higher.   You pass large clots or tissue. Save any tissue for your caregiver to inspect.   Your bleeding increases or you become light-headed, weak, or have fainting episodes.   You have a gush of fluid from your vagina.   You pass out. This could mean you have a tubal (ectopic) pregnancy.  Document Released: 11/23/2005 Document Revised: 08/05/2011 Document Reviewed: 07/09/2008 [employer] Patient Information 2012 [employer].Vaginal Bleeding During Pregnancy, First Trimester A small amount of bleeding (spotting) is relatively common in early pregnancy. It usually stops on its own. There are many causes for bleeding or spotting in early pregnancy. Some bleeding may be related to the pregnancy and some may not. Cramping with the bleeding is more serious and concerning. Tell your caregiver if you have any vaginal bleeding.  CAUSES   It is normal in most cases.   The pregnancy ends (miscarriage).   The pregnancy may end (threatened miscarriage).   Infection or inflammation of the cervix.    Growths (polyps) on the cervix.   Pregnancy happens outside of the uterus and in a fallopian tube (tubal pregnancy).   Many tiny cysts in the uterus instead of pregnancy tissue (molar pregnancy).  SYMPTOMS  Vaginal bleeding or spotting with or without cramps. DIAGNOSIS  To evaluate the pregnancy, your caregiver may:  Do a pelvic exam.   Take blood tests.   Do an ultrasound.  It is very important to follow your caregiver's instructions.  TREATMENT   Evaluation of the pregnancy with blood tests and ultrasound.   Bed rest (getting up to use the bathroom only).   Rho-gam immunization if the mother is Rh negative and the father is Rh positive.  HOME CARE INSTRUCTIONS   If your caregiver orders bed rest, you may need to make arrangements for the care of other children and for other responsibilities. However, your caregiver may allow you to continue light activity.   Keep track of the number of pads you use each day, how often you change pads and how soaked (saturated) they are. Write this down.   Do not use tampons. Do not douche.     Do not have sexual intercourse or orgasms until approved by your physician.   Save any tissue that you pass for your caregiver to see.   Take medicine for cramps only with your caregiver's permission.   Do not take aspirin because it can make you bleed.  SEEK IMMEDIATE MEDICAL CARE IF:   You experience severe cramps in your stomach, back or belly (abdomen).   You have an oral temperature above 102 F (38.9 C), not controlled by medicine.   You pass large clots or tissue.   Your bleeding increases or you become light-headed, weak or have fainting episodes.   You develop chills.   You are leaking or have a gush of fluid from your vagina.   You pass out while having a bowel movement. That may mean you have a ruptured tubal pregnancy.  Document Released: 09/02/2005 Document Revised: 08/05/2011 Document Reviewed: 03/14/2009 [employer]  Patient Information 2012 [employer]. 

## 2011-12-23 NOTE — Progress Notes (Signed)
Notified of all lab and [place] results back on pt.  Stuck in delivery. Will have oncoming CNM see pt.

## 2011-12-23 NOTE — Progress Notes (Signed)
Pt presents to [facility] for c/o abdominal cramping and spotting.  Started earlier yesterday in the morning but was brown and then at 4am woke up with red bleeding.  Cramping has been on and off.

## 2011-12-23 NOTE — Progress Notes (Signed)
Notified of CNM stuck in delivery and oncoming CNM will be up to see pt.

## 2011-12-23 NOTE — ED Provider Notes (Signed)
History   Shelly Lane is a 35y.o. Married, morbidly obese female who presents unannounced w/ CC of vaginal spotting and lower abd cramping for last day.  No VB prior to this point in the pregnancy.  Pt approximately 8.4 weeks per her LMP.  Seen in MAU 11/23/11 for syncopal episode.  Pt currently awaiting pregnancy medicaid, and hasn't yet had any office appts.  Reports last pap June 2012.  No resp or GI c/o's, fever, chills, or UTI s/s.  Spotting has mainly been brown, and has come out onto liner.  Hasn't taken anything for cramps.  Stress at home b/c two youngest children are only 11 months apart, and youngest child is 2 yrs old and her eldest child is 48.  Had pap in June w/ Dr. Su Hilt.    Chief Complaint  Patient presents with  . Abdominal Pain   HPI  OB History    Grav Para Term Preterm Abortions TAB SAB Ect Mult Living   8 6   1  1   6       Past Medical History  Diagnosis Date  . No pertinent past medical history     Past Surgical History  Procedure Date  . Cholecystectomy 2008  . Hiatal hernia repair 2012    Family History  Problem Relation Age of Onset  . Diabetes Maternal Grandmother   . Hypertension Maternal Grandmother     History  Substance Use Topics  . Smoking status: Never Smoker   . Smokeless tobacco: Not on file  . Alcohol Use: No    Allergies:  Allergies  Allergen Reactions  . Codeine Itching  . Oxycodone Itching  . Vicodin (Hydrocodone-Acetaminophen) Itching    Prescriptions prior to admission  Medication Sig Dispense Refill  . Cyanocobalamin (VITAMIN B 12) 100 MCG LOZG Take 1 lozenge by mouth daily.        Marland Kitchen docusate sodium (COLACE) 100 MG capsule Take 100 mg by mouth daily.      . fish oil-omega-3 fatty acids 1000 MG capsule Take 1 g by mouth daily.      Marland Kitchen loratadine (CLARITIN) 10 MG tablet Take 10 mg by mouth daily as needed. For allergies      . Multiple Vitamins-Calcium (ONE-A-DAY WOMENS PO) Take 1 tablet by mouth daily.           ROS--see HPI Physical Exam  .. Results for orders placed during the hospital encounter of 12/23/11 (from the past 24 hour(s))  URINALYSIS, ROUTINE W REFLEX MICROSCOPIC     Status: Abnormal   Collection Time   12/23/11  5:00 AM      Component Value Range   Color, Urine YELLOW  YELLOW    APPearance HAZY (*) CLEAR    Specific Gravity, Urine 1.015  1.005 - 1.030    pH 6.5  5.0 - 8.0    Glucose, UA NEGATIVE  NEGATIVE (mg/dL)   Hgb urine dipstick SMALL (*) NEGATIVE    Bilirubin Urine NEGATIVE  NEGATIVE    Ketones, ur NEGATIVE  NEGATIVE (mg/dL)   Protein, ur NEGATIVE  NEGATIVE (mg/dL)   Urobilinogen, UA 0.2  0.0 - 1.0 (mg/dL)   Nitrite NEGATIVE  NEGATIVE    Leukocytes, UA NEGATIVE  NEGATIVE   URINE MICROSCOPIC-ADD ON     Status: Abnormal   Collection Time   12/23/11  5:00 AM      Component Value Range   Squamous Epithelial / LPF RARE  RARE    RBC / HPF 0-2  <  3 (RBC/hpf)   Bacteria, UA MANY (*) RARE    Urine-Other AMORPHOUS URATES/PHOSPHATES    WET PREP, GENITAL     Status: Abnormal   Collection Time   12/23/11  7:20 AM      Component Value Range   Yeast, Wet Prep NONE SEEN  NONE SEEN    Trich, Wet Prep NONE SEEN  NONE SEEN    Clue Cells, Wet Prep NONE SEEN  NONE SEEN    WBC, Wet Prep HPF POC FEW (*) NONE SEEN    Blood pressure 112/61, pulse 89, temperature 98.8 F (37.1 C), temperature source Oral, resp. rate 18, height 5\' 6"  (1.676 m), weight 118.389 kg (261 lb), last menstrual period 10/24/2011.  Physical Exam  Constitutional: She is oriented to person, place, and time. She appears well-developed and well-nourished. No distress.  Cardiovascular: Normal rate.   Respiratory: Effort normal.  GI: Soft. She exhibits no distension and no mass. There is no tenderness. There is no rebound and no guarding.  Genitourinary:       sm amt of dark red mucousy d/c in vault; ext os multiparous, FT.  No lesions noted and cx pink & smooth.  Neurological: She is alert and oriented to  person, place, and time.  Skin: Skin is warm and dry.   U/s:  SIUP w/ CRL=2.22mm (5.6 weeks) giving EDC 08/18/12; YS and embryo seen, but no cardiac activity.  Moderate SCHnoted.  Ovarias WNL.  No Free Fluid in CDS.  MAU Course  Procedures 1.  Viability u/s 2.  U/a 3.  Gc/ct 4.  Wet prep  Assessment and Plan  1.  IUP at 5.6 weeks (S<D per her LMP) 2. Nonviable fetus  3.  Spotting and cramping 4.  Threatened AB 5.  Morbidly obese 6.  Closely spaced pregnancies 7.  O pos  1.  D/c home w/ SAB & bleeding precautions 2.  If pregnancy medicaid still pending next week, pt to return to MAU for viability u/s; if medicaid has been approved, call office next week for appt there 3.  Healthy lifestyle, PNV po qd, Tylenol prn pain 4.  F/u prn 5. Gc/ct pending at time of d/c  Konni Kesinger H 12/23/2011, 7:30 AM

## 2011-12-27 ENCOUNTER — Inpatient Hospital Stay (HOSPITAL_COMMUNITY): Payer: Medicaid Other

## 2011-12-27 ENCOUNTER — Other Ambulatory Visit (HOSPITAL_COMMUNITY): Payer: Self-pay | Admitting: Obstetrics and Gynecology

## 2011-12-27 ENCOUNTER — Inpatient Hospital Stay (HOSPITAL_COMMUNITY)
Admission: AD | Admit: 2011-12-27 | Discharge: 2011-12-27 | Disposition: A | Discharge status: Home / Self Care | Payer: Medicaid Other | Source: Ambulatory Visit | Attending: Obstetrics and Gynecology | Admitting: Obstetrics and Gynecology

## 2011-12-27 ENCOUNTER — Encounter (HOSPITAL_COMMUNITY): Payer: Self-pay | Admitting: *Deleted

## 2011-12-27 DIAGNOSIS — O039 Complete or unspecified spontaneous abortion without complication: Secondary | ICD-10-CM

## 2011-12-27 LAB — CBC
HCT: 37.7 % (ref 36.0–46.0)
Hemoglobin: 12.4 g/dL (ref 12.0–15.0)
MCH: 28 pg (ref 26.0–34.0)
MCHC: 32.9 g/dL (ref 30.0–36.0)
MCV: 85.1 fL (ref 78.0–100.0)
Platelets: 290 10*3/uL (ref 150–400)
RBC: 4.43 MIL/uL (ref 3.87–5.11)
RDW: 14.5 % (ref 11.5–15.5)
WBC: 8 10*3/uL (ref 4.0–10.5)

## 2011-12-27 LAB — HCG, QUANTITATIVE, PREGNANCY: hCG, Beta Chain, Quant, S: 684 m[IU]/mL — ABNORMAL HIGH (ref <5)

## 2011-12-27 NOTE — Progress Notes (Signed)
"  I think I miscarried" states she has been bleeding 01/15 off/on. Now just spotting. No pain at this time.

## 2011-12-27 NOTE — ED Provider Notes (Signed)
History   36 yo [id number] at 79 w 3d by [place] on 1/16 presented reporting likely complete SAB over last few days.  Seen 1/16 in MAU for spotting and cramping at 9 weeks by dates.  US showed 5 6/7 week IUP with no FHR.  Since then, patient had heavier bleeding, now resolved to small amount and minimal cramping.  Previous QHCG 12/17 = 226.  O+ type.  Pregnancy remarkable for: Grand multip Hx PTD with all previous babies 1 previous SAB, 1 TAB   Chief Complaint  Patient presents with  . Miscarriage    OB History    Grav Para Term Preterm Abortions TAB SAB Ect Mult Living   8 6   1  1   6       Past Medical History  Diagnosis Date  . No pertinent past medical history     Past Surgical History  Procedure Date  . Cholecystectomy 2008  . Hiatal hernia repair 2012    Family History  Problem Relation Age of Onset  . Diabetes Maternal Grandmother   . Hypertension Maternal Grandmother     History  Substance Use Topics  . Smoking status: Never Smoker   . Smokeless tobacco: Not on file  . Alcohol Use: No    Allergies:  Allergies  Allergen Reactions  . Codeine Itching  . Oxycodone Itching  . Vicodin (Hydrocodone-Acetaminophen) Itching    Prescriptions prior to admission  Medication Sig Dispense Refill  . Cyanocobalamin (VITAMIN B 12) 100 MCG LOZG Take 1 lozenge by mouth daily.        Marland Kitchen docusate sodium (COLACE) 100 MG capsule Take 100 mg by mouth daily.      . fish oil-omega-3 fatty acids 1000 MG capsule Take 1 g by mouth daily.      Marland Kitchen loratadine (CLARITIN) 10 MG tablet Take 10 mg by mouth daily as needed. For allergies      . Multiple Vitamins-Calcium (ONE-A-DAY WOMENS PO) Take 1 tablet by mouth daily.           Physical Exam   Blood pressure 109/72, pulse 85, temperature 98.2 F (36.8 C), resp. rate 20, height 5' 6.25" (1.683 m), weight 258 lb (117.028 kg), last menstrual period 10/24/2011.  Chest clear Heart RRR without murmur Abd soft, NT, morbid  obesity Pelvic--small amount pink d/c, cervix multiparous os, but firm, essentially closed Ext WNL  Results for orders placed during the hospital encounter of 12/27/11 (from the past 24 hour(s))  CBC     Status: Normal   Collection Time   12/27/11 10:00 PM      Component Value Range   WBC 8.0  4.0 - 10.5 (K/uL)   RBC 4.43  3.87 - 5.11 (MIL/uL)   Hemoglobin 12.4  12.0 - 15.0 (g/dL)   HCT 53.6  64.4 - 03.4 (%)   MCV 85.1  78.0 - 100.0 (fL)   MCH 28.0  26.0 - 34.0 (pg)   MCHC 32.9  30.0 - 36.0 (g/dL)   RDW 74.2  59.5 - 63.8 (%)   Platelets 290  150 - 400 (K/uL)  HCG, QUANTITATIVE, PREGNANCY     Status: Abnormal   Collection Time   12/27/11 10:00 PM      Component Value Range   hCG, Beta Chain, Quant, S 684 (*) <5 (mIU/mL)   [place]:  IUP seen on previous study no longer present.  Adnexa appear normal.  No pelvic fluid.         Findings consistent  with spontaneous abortion.  No retained products are identified.   ED Course  Complete SAB--stable  Plan: Repeat QHCG in 1 week (01/03/12) at [facility] (patient not yet able to cover office costs).  Will follow to 0. May leave after lab draw, with [facility] result called to CNM on call. Patient's best telephone number:  [phone].  Nigel Bridgeman, CNM, [place] 12/27/11 11:30pm

## 2011-12-27 NOTE — Progress Notes (Signed)
Addended by: Nigel Bridgeman on: 12/27/2011 09:03 PM   Modules accepted: Orders

## 2011-12-27 NOTE — ED Notes (Signed)
Comfort care given.  Miscarriage poem and heart given to patient.

## 2012-06-21 ENCOUNTER — Telehealth: Payer: Self-pay | Admitting: Obstetrics and Gynecology

## 2012-06-21 NOTE — Telephone Encounter (Signed)
Shelly Lane/sr pt °

## 2012-06-22 ENCOUNTER — Ambulatory Visit (INDEPENDENT_AMBULATORY_CARE_PROVIDER_SITE_OTHER): Payer: Medicaid Other | Admitting: Obstetrics and Gynecology

## 2012-06-22 ENCOUNTER — Encounter: Payer: Self-pay | Admitting: Obstetrics and Gynecology

## 2012-06-22 VITALS — BP 90/62 | Ht 66.0 in | Wt 260.0 lb

## 2012-06-22 DIAGNOSIS — B379 Candidiasis, unspecified: Secondary | ICD-10-CM

## 2012-06-22 DIAGNOSIS — B49 Unspecified mycosis: Secondary | ICD-10-CM

## 2012-06-22 DIAGNOSIS — Z331 Pregnant state, incidental: Secondary | ICD-10-CM

## 2012-06-22 LAB — POCT WET PREP (WET MOUNT)
Bacteria Wet Prep HPF POC: NEGATIVE
Clue Cells Wet Prep Whiff POC: NEGATIVE
Trichomonas Wet Prep HPF POC: NEGATIVE
WBC, Wet Prep HPF POC: NEGATIVE
pH: 4.5

## 2012-06-22 LAB — POCT URINALYSIS DIPSTICK
Bilirubin, UA: NEGATIVE
Glucose, UA: NEGATIVE
Ketones, UA: NEGATIVE
Leukocytes, UA: NEGATIVE
Nitrite, UA: NEGATIVE
Spec Grav, UA: 1.02
Urobilinogen, UA: NEGATIVE
pH, UA: 7

## 2012-06-22 MED ORDER — PRENATAL 27-0.8 MG PO TABS: 1.0000 | ORAL_TABLET | Freq: Every day | ORAL | Status: DC

## 2012-06-22 NOTE — Progress Notes (Signed)
  Early preg complaints. Pt  scheduled 07/06/12 nob work up. C/O: Carpel tunnel, HA's, muscle spasms, pain with intercourse and some vaginal pain pt questions if it's a yeast infection.

## 2012-06-22 NOTE — Progress Notes (Signed)
Subjective:  [id number] Stated by patient. Patient had been pregnant in Jan 2013 and had hospital visit 12/23/2011. States that she passed POC around 12/25/11. No f/u after SAB.  States that she had resumed menstrual cycle in Feb, March, April, May, June 2013.  Shelly Lane is being seen today for her first obstetrical visit at [redacted]w[redacted]d  She and come to clinic as a GYN visit for ? Yeast or BV infection. She reports hone testing for pregnancy. Reports positive pregnancy test x 2 Sure of LMP 05/23/12. Patient states that she had intercourse at time of ovultion  - using ovulation chart for  Birth control. Hx of GDM in pregnancy 2005, 2010  Her obstetrical history is significant for: Patient Active Problem List  Diagnosis  . HYPERCHOLESTEROLEMIA  . OBESITY  . ANXIETY  . BULIMIA  . DEPRESSION  . METRORRHAGIA  . ECZEMA  . URTICARIA NOS  . SYMPTOM, PAIN, ABDOMINAL, EPIGASTRIC  . Pregnant state, incidental    Relationship with FOB: Married to [name]   Patient does intend to breast feed.   Pregnancy history fully reviewed.    Review of Systems Pertinent ROS is described in HPI   Objective:   BP 90/62  Ht 5\' 6"  (1.676 m)  Wt 260 lb (117.935 kg)  BMI 41.97 kg/m2  LMP 05/23/2012  Breastfeeding? Unknown Wt Readings from Last 1 Encounters:  06/22/12 260 lb (117.935 kg)   BMI: Body mass index is 41.97 kg/(m^2).  General: alert, cooperative and no distress Respiratory: clear to auscultation bilaterally Cardiovascular: regular rate and rhythm, S1, S2 normal, no murmur Breasts:  No dominant masses, nipples erect Gastrointestinal: soft, non-tender; no masses,  no organomegaly Extremities: extremities normal,complains of Carpel Tunnel Syndrome type pain Rt and Lt wrists - associated with pregnancy. Lower extremities no pain or edemna Vaginal Bleeding: None  EXTERNAL GENITALIA: normal appearing vulva with no masses, tenderness or lesions VAGINA: no abnormal discharge or  lesions CERVIX: no lesions or cervical motion tenderness; cervix closed, long, firm. UTERUS: gravid and consistent with 5 weeks ADNEXA: no masses palpable and nontender OB EXAM PELVIMETRY: adequate  To return on 07/06/12 for pregnancy confirmation USS and and prenatal panel.  Assessment:    Pregnancy at  [redacted]w[redacted]d by LMP stated by patient. Plan:     Prenatal panel to be drawn on 07/06/12 Pap smear collected:today 06/22/12 GC/Chlamydia collected: 06/22/12 - from pap. Wet prep: PH 4.5, Yeast Discussion of Genetic testing options: desires [facility]. Prenatal vitamins recommended - prescribed [employer] (Sampled Done)  Problem list reviewed and updated.  Plan of care: Follow up in 07/06/12 for USS and Prenatal Panel Plan early Glucola 18wks  Earl Gala CNM, [place] 06/22/2012 5:47 PM

## 2012-06-23 NOTE — Telephone Encounter (Signed)
Pt seen 06-22-12  DD  ld

## 2012-06-24 ENCOUNTER — Encounter: Payer: Self-pay | Admitting: Obstetrics and Gynecology

## 2012-06-24 LAB — CULTURE, OB URINE
Colony Count: NO GROWTH
Organism ID, Bacteria: NO GROWTH

## 2012-06-24 MED ORDER — TERCONAZOLE 80 MG VA SUPP: 80.0000 mg | Freq: Every day | VAGINAL | Status: AC

## 2012-06-24 NOTE — Addendum Note (Signed)
Addended by: Earl Gala on: 06/24/2012 08:56 AM   Modules accepted: Orders

## 2012-06-27 LAB — PAP IG, CT-NG, RFX HPV ASCU
Chlamydia Probe Amp: NEGATIVE
GC Probe Amp: NEGATIVE

## 2012-07-05 ENCOUNTER — Other Ambulatory Visit: Payer: Self-pay | Admitting: Obstetrics and Gynecology

## 2012-07-05 DIAGNOSIS — Z3687 Encounter for antenatal screening for uncertain dates: Secondary | ICD-10-CM

## 2012-07-06 ENCOUNTER — Ambulatory Visit (INDEPENDENT_AMBULATORY_CARE_PROVIDER_SITE_OTHER): Payer: Medicaid Other | Admitting: Obstetrics and Gynecology

## 2012-07-06 ENCOUNTER — Ambulatory Visit (INDEPENDENT_AMBULATORY_CARE_PROVIDER_SITE_OTHER): Payer: Medicaid Other

## 2012-07-06 VITALS — BP 102/70 | Wt 250.0 lb

## 2012-07-06 DIAGNOSIS — Z331 Pregnant state, incidental: Secondary | ICD-10-CM

## 2012-07-06 DIAGNOSIS — Z3687 Encounter for antenatal screening for uncertain dates: Secondary | ICD-10-CM

## 2012-07-06 DIAGNOSIS — Z1389 Encounter for screening for other disorder: Secondary | ICD-10-CM

## 2012-07-06 DIAGNOSIS — Z3490 Encounter for supervision of normal pregnancy, unspecified, unspecified trimester: Secondary | ICD-10-CM

## 2012-07-06 NOTE — Progress Notes (Signed)
[redacted]w[redacted]d USS today for dating and viability. USS: [redacted]w[redacted]d, Yolk sac seen, Grestational sac is positioning in the lower inferior section of the uterus , Heart rate =96bpm, not raessuring.          F/u USS for viability in 1 week

## 2012-07-07 ENCOUNTER — Other Ambulatory Visit: Payer: Self-pay

## 2012-07-07 DIAGNOSIS — O3680X0 Pregnancy with inconclusive fetal viability, not applicable or unspecified: Secondary | ICD-10-CM

## 2012-07-15 ENCOUNTER — Other Ambulatory Visit: Payer: Medicaid Other

## 2012-07-15 ENCOUNTER — Encounter: Payer: Medicaid Other | Admitting: Obstetrics and Gynecology

## 2012-07-18 ENCOUNTER — Ambulatory Visit (INDEPENDENT_AMBULATORY_CARE_PROVIDER_SITE_OTHER): Payer: Medicaid Other

## 2012-07-18 ENCOUNTER — Ambulatory Visit (INDEPENDENT_AMBULATORY_CARE_PROVIDER_SITE_OTHER): Payer: Medicaid Other | Admitting: Obstetrics and Gynecology

## 2012-07-18 ENCOUNTER — Other Ambulatory Visit: Payer: Self-pay | Admitting: Obstetrics and Gynecology

## 2012-07-18 ENCOUNTER — Encounter: Payer: Self-pay | Admitting: Obstetrics and Gynecology

## 2012-07-18 VITALS — BP 90/62 | Wt 265.0 lb

## 2012-07-18 DIAGNOSIS — O26849 Uterine size-date discrepancy, unspecified trimester: Secondary | ICD-10-CM

## 2012-07-18 DIAGNOSIS — O3680X0 Pregnancy with inconclusive fetal viability, not applicable or unspecified: Secondary | ICD-10-CM

## 2012-07-18 DIAGNOSIS — Z3490 Encounter for supervision of normal pregnancy, unspecified, unspecified trimester: Secondary | ICD-10-CM

## 2012-07-18 DIAGNOSIS — Z331 Pregnant state, incidental: Secondary | ICD-10-CM

## 2012-07-18 LAB — US OB TRANSVAGINAL

## 2012-07-18 NOTE — Progress Notes (Signed)
Pt wants CNM recommendation what meds or vitamins to take for energy  Pt wants to know is Melatonin safe in pregnancy

## 2012-07-18 NOTE — Progress Notes (Signed)
[redacted]w[redacted]d USS today for viability. Result: IUP [redacted]w[redacted]d, Yolk Sac seen, Normal ovaries, No fluid in CGS, Normal adnexa, Subchorionic collection 3.2 x 1.0 x 2.7cms. No vaginal bleeding. No change in vaginal secretions No cramping or abdominal pain. Offered patient to return in 2 weeks but declined. Would like to return in 4 weeks. In 4 weeks will repeat viability USS to assess fetal well being. Patient states that she will return sooner if she has any problems. ROB x 4 weeks

## 2012-07-28 ENCOUNTER — Other Ambulatory Visit: Payer: Self-pay | Admitting: Obstetrics and Gynecology

## 2012-07-28 DIAGNOSIS — Z3687 Encounter for antenatal screening for uncertain dates: Secondary | ICD-10-CM

## 2012-07-28 LAB — US OB TRANSVAGINAL

## 2012-08-07 ENCOUNTER — Encounter (HOSPITAL_COMMUNITY): Payer: Self-pay | Admitting: Obstetrics and Gynecology

## 2012-08-07 ENCOUNTER — Inpatient Hospital Stay (HOSPITAL_COMMUNITY)
Admission: AD | Admit: 2012-08-07 | Discharge: 2012-08-07 | Disposition: A | Discharge status: Home / Self Care | Payer: Medicaid Other | Source: Ambulatory Visit | Attending: Obstetrics and Gynecology | Admitting: Obstetrics and Gynecology

## 2012-08-07 ENCOUNTER — Inpatient Hospital Stay (HOSPITAL_COMMUNITY): Payer: Medicaid Other

## 2012-08-07 DIAGNOSIS — O034 Incomplete spontaneous abortion without complication: Secondary | ICD-10-CM

## 2012-08-07 DIAGNOSIS — R109 Unspecified abdominal pain: Secondary | ICD-10-CM | POA: Insufficient documentation

## 2012-08-07 LAB — URINE MICROSCOPIC-ADD ON

## 2012-08-07 LAB — URINALYSIS, ROUTINE W REFLEX MICROSCOPIC
Bilirubin Urine: NEGATIVE
Glucose, UA: NEGATIVE mg/dL
Ketones, ur: NEGATIVE mg/dL
Nitrite: NEGATIVE
Protein, ur: NEGATIVE mg/dL
Specific Gravity, Urine: 1.015 (ref 1.005–1.030)
Urobilinogen, UA: 0.2 mg/dL (ref 0.0–1.0)
pH: 6.5 (ref 5.0–8.0)

## 2012-08-07 MED ORDER — IBUPROFEN 200 MG PO TABS: 600.0000 mg | ORAL_TABLET | Freq: Four times a day (QID) | ORAL | Status: AC | PRN

## 2012-08-07 NOTE — Progress Notes (Signed)
History  Shelly Lane is a 36 y.o. [phone] at [redacted]w[redacted]d   Subjective: brown discharge since yesterday, cramping and bm each time after eating that is soft x 2 weeks, no N,V,D, or UTI s/s. Craving fruit and meat    Chief Complaint  Patient presents with  . Vaginal Bleeding   @SFHPI @  Vitals:  Blood pressure 121/76, pulse 87, temperature 98.7 F (37.1 C), temperature source Oral, resp. rate 18, height 5' 6.25" (1.683 m), weight 269 lb 12.8 oz (122.38 kg), last menstrual period 05/23/2012. OB History    Grav Para Term Preterm Abortions TAB SAB Ect Mult Living   9 6 4 2 2  2   6       Past Medical History  Diagnosis Date  . Depression   . H/O: depression   . H/O candidiasis   . H/O rubella   . H/O varicella   . Hx: UTI (urinary tract infection)   . GERD (gastroesophageal reflux disease)   . H/O: obesity   . H/O migraine   . Abnormal Pap smear     Colpo  . ASCUS with positive high risk HPV     Past Surgical History  Procedure Date  . Cholecystectomy 2008  . Hiatal hernia repair 2012  . Wisdom tooth extraction     Family History  Problem Relation Age of Onset  . Diabetes Maternal Grandmother   . Hypertension Maternal Grandmother     History  Substance Use Topics  . Smoking status: Never Smoker   . Smokeless tobacco: Never Used  . Alcohol Use: No    Allergies:  Allergies  Allergen Reactions  . Codeine Itching  . Oxycodone Itching  . Vicodin (Hydrocodone-Acetaminophen) Itching    Prescriptions prior to admission  Medication Sig Dispense Refill  . clobetasol cream (TEMOVATE) 0.05 % Apply 1 application topically 2 (two) times daily.      . Melatonin 5 MG CAPS Take 1 capsule by mouth at bedtime. Take 30 minutes before bedtime      . Prenatal Vit-Fe Fumarate-FA (MULTIVITAMIN-PRENATAL) 27-0.8 MG TABS Take 1 tablet by mouth daily.  30 each  4    @ROS @ Physical Exam   Blood pressure 121/76, pulse 87, temperature 98.7 F (37.1 C), temperature source Oral,  resp. rate 18, height 5' 6.25" (1.683 m), weight 269 lb 12.8 oz (122.38 kg), last menstrual period 05/23/2012.  @PHYSEXAMBYAGE2 @ Labs:  Recent Results (from the past 24 hour(s))  URINALYSIS, ROUTINE W REFLEX MICROSCOPIC   Collection Time   08/07/12  2:05 PM      Component Value Range   Color, Urine YELLOW  YELLOW   APPearance CLEAR  CLEAR   Specific Gravity, Urine 1.015  1.005 - 1.030   pH 6.5  5.0 - 8.0   Glucose, UA NEGATIVE  NEGATIVE mg/dL   Hgb urine dipstick LARGE (*) NEGATIVE   Bilirubin Urine NEGATIVE  NEGATIVE   Ketones, ur NEGATIVE  NEGATIVE mg/dL   Protein, ur NEGATIVE  NEGATIVE mg/dL   Urobilinogen, UA 0.2  0.0 - 1.0 mg/dL   Nitrite NEGATIVE  NEGATIVE   Leukocytes, UA SMALL (*) NEGATIVE  URINE MICROSCOPIC-ADD ON   Collection Time   08/07/12  2:05 PM      Component Value Range   Squamous Epithelial / LPF FEW (*) RARE   WBC, UA 3-6  <3 WBC/hpf   RBC / HPF 3-6  <3 RBC/hpf   Bacteria, UA MANY (*) RARE   Medication List  As of 08/07/2012  3:45 PM   ASK your doctor about these medications         clobetasol cream 0.05 %   Commonly known as: TEMOVATE   Apply 1 application topically 2 (two) times daily.      Melatonin 5 MG Caps   Take 1 capsule by mouth at bedtime. Take 30 minutes before bedtime      multivitamin-prenatal 27-0.8 MG Tabs   Take 1 tablet by mouth daily.            IASSESSMENT: Patient Active Problem List  Diagnosis  . HYPERCHOLESTEROLEMIA  . OBESITY  . ANXIETY  . BULIMIA  . DEPRESSION  . METRORRHAGIA  . ECZEMA  . URTICARIA NOS  . SYMPTOM, PAIN, ABDOMINAL, EPIGASTRIC  . Pregnant state, incidental   Physical Examination:  Physical exam: Calm, no distress,  abd soft, nt, bowel sounds active Normal hair distrubition mons pubis,  EGBUS WNL, sterile speculum exam,  vagina pink, moist normal rugae,  Cervix LTC, scant tan discharge FHTS not obtained per doppler Hx of prior US on 8/12 with IUP [redacted]w[redacted]d, Yolk Sac seen, Normal ovaries, No fluid in  CGS, Normal adnexa, Subchorionic collection 3.2 x 1.0 x 2.7cms.  fhts + No vaginal bleeding. O positive ED Course  [redacted]w[redacted]d Vag bleeding [place] pending for fhts Lavera Guise, CNM  Addendum: [place] gestational sac with fetal pole no FHTS 37w1day, offered f/o [place] at office, waiting for SAB, cytotec, D&E, prefers to home now and wait for SAB has f/o with Dr. Estanislado Pandy on 9/12 and wants to keep this appointment will call office if changes her mind regarding options. Rx Motrin discussed s/s bleeding and pain to report, discussed frequent sm meals regarding GI upset as noted above. Collaboration with Dr. Normand Sloop per telephone. Lavera Guise, CNM

## 2012-08-07 NOTE — MAU Note (Signed)
Pt reports having cramping after she eats for several weeks.  Last night it got really bad. Pt reports having brown discharge this morning.

## 2012-08-08 ENCOUNTER — Telehealth: Payer: Self-pay | Admitting: Obstetrics and Gynecology

## 2012-08-08 ENCOUNTER — Other Ambulatory Visit: Payer: Self-pay | Admitting: Obstetrics and Gynecology

## 2012-08-08 ENCOUNTER — Encounter: Payer: Self-pay | Admitting: Obstetrics and Gynecology

## 2012-08-08 DIAGNOSIS — O039 Complete or unspecified spontaneous abortion without complication: Secondary | ICD-10-CM

## 2012-08-08 LAB — CBC WITH DIFFERENTIAL/PLATELET
Basophils Absolute: 0.1 10*3/uL (ref 0.0–0.1)
Basophils Relative: 1 % (ref 0–1)
Eosinophils Absolute: 0.1 10*3/uL (ref 0.0–0.7)
Eosinophils Relative: 1 % (ref 0–5)
HCT: 38.9 % (ref 36.0–46.0)
Hemoglobin: 12.8 g/dL (ref 12.0–15.0)
Lymphocytes Relative: 30 % (ref 12–46)
Lymphs Abs: 2.6 10*3/uL (ref 0.7–4.0)
MCH: 28.1 pg (ref 26.0–34.0)
MCHC: 32.9 g/dL (ref 30.0–36.0)
MCV: 85.3 fL (ref 78.0–100.0)
Monocytes Absolute: 0.6 10*3/uL (ref 0.1–1.0)
Monocytes Relative: 7 % (ref 3–12)
Neutro Abs: 5.1 10*3/uL (ref 1.7–7.7)
Neutrophils Relative %: 61 % (ref 43–77)
Platelets: 282 10*3/uL (ref 150–400)
RBC: 4.56 MIL/uL (ref 3.87–5.11)
RDW: 14.4 % (ref 11.5–15.5)
WBC: 8.4 10*3/uL (ref 4.0–10.5)

## 2012-08-08 NOTE — Telephone Encounter (Signed)
Need to check CBC for hemoglobin if high enough will use cytotec. Discussed cytotec side effects, vaginal or rectal x 1 all the medicine at once, phenergan for pain or nausea, s/s bleeding to report if soaking a pad in an hour will need to be seen. Has office f/o 9/12 as scheduled. Call prn concerns. Collaboration with Dr. Normand Sloop. Lavera Guise, CNM

## 2012-08-08 NOTE — Progress Notes (Unsigned)
Patient ID: Shelly Lane, female   DOB: [birth year], 36 y.o.   MRN: [id number] HGB 12 gave RX phenergan, percocet, misoprostol as noted prior questions answered. Collaboration with Dr. Normand Sloop at [facility]. Lavera Guise, CNM

## 2012-08-09 ENCOUNTER — Other Ambulatory Visit: Payer: Self-pay | Admitting: Obstetrics and Gynecology

## 2012-08-09 ENCOUNTER — Telehealth: Payer: Self-pay | Admitting: Obstetrics and Gynecology

## 2012-08-09 DIAGNOSIS — O039 Complete or unspecified spontaneous abortion without complication: Secondary | ICD-10-CM

## 2012-08-09 NOTE — Telephone Encounter (Signed)
TC from pt. States was seen for SAB and given Cytotec.Began bleeding last PM with heavy bleeding, changing pad q 10 min and severe cramping for which she took Percocet.  Today bleeding is like normal menses. Has not taken any pain med. States passed clots and placenta but did not see embryo or sac.  Reviewed U/S with DD via phone. Informed no yolk sac visible on U/S and fetal pole too small to be seen. May have been in blood clot. Pt questioning if can do testing on placenta to see what caused SAB but per DD would need fetus for genetic testing. Per DD scheduled [facility] 08/11/12 and 08/15/12.  EXplained is best way to evaluate if pregnancy is resolving.   Pt to call with bleeding >pad/hr or increased pain or fever. Pt verbalizes comprehension. Sympathy given.

## 2012-08-09 NOTE — Telephone Encounter (Signed)
Triage/ob °

## 2012-08-10 ENCOUNTER — Encounter (HOSPITAL_COMMUNITY): Payer: Self-pay | Admitting: *Deleted

## 2012-08-10 ENCOUNTER — Inpatient Hospital Stay (HOSPITAL_COMMUNITY)
Admission: AD | Admit: 2012-08-10 | Discharge: 2012-08-10 | Disposition: A | Discharge status: Home / Self Care | Payer: Medicaid Other | Source: Ambulatory Visit | Attending: Obstetrics and Gynecology | Admitting: Obstetrics and Gynecology

## 2012-08-10 ENCOUNTER — Inpatient Hospital Stay (HOSPITAL_COMMUNITY): Payer: Medicaid Other

## 2012-08-10 DIAGNOSIS — O209 Hemorrhage in early pregnancy, unspecified: Secondary | ICD-10-CM | POA: Insufficient documentation

## 2012-08-10 LAB — CBC
HCT: 32.5 % — ABNORMAL LOW (ref 36.0–46.0)
Hemoglobin: 10.9 g/dL — ABNORMAL LOW (ref 12.0–15.0)
MCH: 28.5 pg (ref 26.0–34.0)
MCHC: 33.5 g/dL (ref 30.0–36.0)
MCV: 85.1 fL (ref 78.0–100.0)
Platelets: 227 10*3/uL (ref 150–400)
RBC: 3.82 MIL/uL — ABNORMAL LOW (ref 3.87–5.11)
RDW: 14.2 % (ref 11.5–15.5)
WBC: 9.5 10*3/uL (ref 4.0–10.5)

## 2012-08-10 LAB — HCG, QUANTITATIVE, PREGNANCY: hCG, Beta Chain, Quant, S: 3465 m[IU]/mL — ABNORMAL HIGH (ref <5)

## 2012-08-10 MED ORDER — OXYCODONE-ACETAMINOPHEN 5-325 MG PO TABS
2.0000 | ORAL_TABLET | Freq: Once | ORAL | Status: AC
  Administered 2012-08-10: 2 via ORAL
  Filled 2012-08-10: qty 2

## 2012-08-10 MED ORDER — LACTATED RINGERS IV SOLN: INTRAVENOUS | Status: DC

## 2012-08-10 NOTE — MAU Note (Signed)
No active bleeding at present

## 2012-08-10 NOTE — Progress Notes (Signed)
History   Shelly Lane has presented this morning with concerns that she may have POC retained s/p Cytotec For incomplete AB. This patient had been diagnosed with incomplete abortion on 08/07/12 after visit at MAU. The abortion was identified by USS at this visit. The patient had taken Cytotec 400 mcgs po and had moderate amount of PV bleeding. The patient gives history of passing clots and something that looked like a small placenta. The patient has had bleeding PV that has been light to moderate throughout the night. The patient has a concern that there are some retained products of conception in the os and uterus. The patient has been evaluate due to possible retained products of conception.  Chief Complaint  Patient presents with  . Vaginal Bleeding   @SFHPI @  OB History    Grav Para Term Preterm Abortions TAB SAB Ect Mult Living   9 6 4 2 2  2   6       Past Medical History  Diagnosis Date  . Depression   . H/O: depression   . H/O candidiasis   . H/O rubella   . H/O varicella   . Hx: UTI (urinary tract infection)   . GERD (gastroesophageal reflux disease)   . H/O: obesity   . H/O migraine   . Abnormal Pap smear     Colpo  . ASCUS with positive high risk HPV     Past Surgical History  Procedure Date  . Cholecystectomy 2008  . Hiatal hernia repair 2012  . Wisdom tooth extraction     Family History  Problem Relation Age of Onset  . Diabetes Maternal Grandmother   . Hypertension Maternal Grandmother     History  Substance Use Topics  . Smoking status: Never Smoker   . Smokeless tobacco: Never Used  . Alcohol Use: No    Allergies:  Allergies  Allergen Reactions  . Codeine Itching  . Oxycodone Itching    Per RN report, pt has taken percocet without issues????  . Vicodin (Hydrocodone-Acetaminophen) Itching    Prescriptions prior to admission  Medication Sig Dispense Refill  . clobetasol cream (TEMOVATE) 0.05 % Apply 1 application topically 2 (two) times  daily.      Marland Kitchen ibuprofen (MOTRIN IB) 200 MG tablet Take 3 tablets (600 mg total) by mouth every 6 (six) hours as needed for pain.  30 tablet  1  . Melatonin 5 MG CAPS Take 1 capsule by mouth at bedtime. Take 30 minutes before bedtime      . Prenatal Vit-Fe Fumarate-FA (MULTIVITAMIN-PRENATAL) 27-0.8 MG TABS Take 1 tablet by mouth daily.  30 each  4   Physical Exam   Affect alert and oriented x 3 Lungs: Bilaterally Clear CV: RRR GI: Normal Abdomen: Non-tender, BS x 4 GU: normal with no dysuria Extremities: Normal  Blood pressure 104/67, pulse 73, temperature 98.7 F (37.1 C), temperature source Oral, resp. rate 20, height 5' 6.25" (1.683 m), weight 270 lb (122.471 kg), last menstrual period 05/23/2012, SpO2 100.00%.  ED Course   The patient has had a Quant collected and a transvaginal USS to estimate if products of conception are retained. USS Impression 08/10/12 Mild prominence of Endometrium without focal abnormalities. No intrauterine gestation sac identified, no increased vascularity. Although this examination does not exclude retained POC, it is unlikely from today's examination. F/u Beta HCG Quant advised. The patient is to have a Quant on Friday  08/12/12. Speculum examination: Moderate amount of blood seen in the posterior fornix. Caryn Section  swap to remove the excess blood. Cx was dilated to 2 cms. No active bleeding noted at the time of the examintion. The patient is concerned that she has "something wrong with her". This her 2nd consecutive SAB. The patient has been advised that there are many possible contributing factors but it is hard to identify any one specific at this time. The patient was to d/c home and advised to take Ibuprofen 600mg s po Q 6 - 8hrly for cramping. F.U [facility] as scheduled.   Shelly Lane CNM, [place] 08/10/2012 10:39 AM

## 2012-08-10 NOTE — MAU Note (Signed)
Scant amount of bleeding no active bleeding at this time.

## 2012-08-11 ENCOUNTER — Other Ambulatory Visit: Payer: Medicaid Other

## 2012-08-12 ENCOUNTER — Encounter: Payer: Self-pay | Admitting: Obstetrics and Gynecology

## 2012-08-12 ENCOUNTER — Other Ambulatory Visit: Payer: Medicaid Other

## 2012-08-12 DIAGNOSIS — O039 Complete or unspecified spontaneous abortion without complication: Secondary | ICD-10-CM

## 2012-08-12 LAB — HCG, QUANTITATIVE, PREGNANCY: hCG, Beta Chain, Quant, S: 1008 m[IU]/mL

## 2012-08-12 NOTE — Progress Notes (Signed)
Pt in office for bld work had sab having cramping,blding like a cycle had cytotec  Inserted on Monday advised pt we would expect her to still have cramping tylenol not helping stated she did not feel well like ? The flu  Pt with no other sx related to sab,advised pt to try ibuprofin rest,increase fluids watch for d/c with odor,severe pain and if temp is greater than 100.4 or no improvement call CNM on call, call office # choose option 4 pt understands and is agreeable DFaulconerRN

## 2012-08-15 ENCOUNTER — Other Ambulatory Visit: Payer: Medicaid Other

## 2012-08-15 DIAGNOSIS — O035 Genital tract and pelvic infection following complete or unspecified spontaneous abortion: Secondary | ICD-10-CM

## 2012-08-15 LAB — HCG, QUANTITATIVE, PREGNANCY: hCG, Beta Chain, Quant, S: 300.6 m[IU]/mL

## 2012-08-17 ENCOUNTER — Other Ambulatory Visit: Payer: Self-pay

## 2012-08-17 ENCOUNTER — Other Ambulatory Visit: Payer: Medicaid Other

## 2012-08-17 DIAGNOSIS — O039 Complete or unspecified spontaneous abortion without complication: Secondary | ICD-10-CM

## 2012-08-17 LAB — HCG, QUANTITATIVE, PREGNANCY: hCG, Beta Chain, Quant, S: 154.8 m[IU]/mL

## 2012-08-18 ENCOUNTER — Encounter: Payer: Medicaid Other | Admitting: Obstetrics and Gynecology

## 2012-11-28 ENCOUNTER — Encounter (HOSPITAL_COMMUNITY): Payer: Self-pay

## 2012-11-28 ENCOUNTER — Emergency Department (HOSPITAL_COMMUNITY)
Admission: EM | Admit: 2012-11-28 | Discharge: 2012-11-28 | Disposition: A | Discharge status: Home / Self Care | Payer: Medicaid Other | Source: Home / Self Care | Attending: Emergency Medicine | Admitting: Emergency Medicine

## 2012-11-28 ENCOUNTER — Emergency Department (INDEPENDENT_AMBULATORY_CARE_PROVIDER_SITE_OTHER): Payer: Medicaid Other

## 2012-11-28 DIAGNOSIS — M549 Dorsalgia, unspecified: Secondary | ICD-10-CM

## 2012-11-28 HISTORY — DX: Complete or unspecified spontaneous abortion without complication: O03.9

## 2012-11-28 MED ORDER — CYCLOBENZAPRINE HCL 10 MG PO TABS: 10.0000 mg | ORAL_TABLET | Freq: Three times a day (TID) | ORAL | Status: AC | PRN

## 2012-11-28 MED ORDER — CYCLOBENZAPRINE HCL 10 MG PO TABS: 10.0000 mg | ORAL_TABLET | Freq: Three times a day (TID) | ORAL | Status: DC | PRN

## 2012-11-28 MED ORDER — IBUPROFEN 800 MG PO TABS: 800.0000 mg | ORAL_TABLET | Freq: Once | ORAL | Status: DC

## 2012-11-28 MED ORDER — MELOXICAM 7.5 MG PO TABS: 7.5000 mg | ORAL_TABLET | Freq: Every day | ORAL | Status: DC

## 2012-11-28 NOTE — ED Provider Notes (Signed)
History     CSN: [id number]  Arrival date & time 11/28/12  1430   First MD Initiated Contact with Patient 11/28/12 1521      Chief Complaint  Patient presents with  . Back Pain    (Consider location/radiation/quality/duration/timing/severity/associated sxs/prior treatment) HPI Comments: Patient presents urgent care this evening complaining of sudden onset of lower back pain (right lower paravertebral region), she describes that while at a activity last night she was getting a chicken out of the open when she heard a pop on her lower back started experiencing pain radiating down to her right gluteal area outer aspect of her right THIGH... denies any urinary or fecal incontinence. No saddle paresthesia. No weakness of her lower extremities.  Patient is a 36 y.o. female presenting with back pain. The history is provided by the patient.  Back Pain  This is a new problem. The current episode started yesterday. The problem occurs constantly. The problem has been gradually worsening. Associated with: WHILE PULLING OUT A TRAY WITH CHIKEN.. The pain is present in the lumbar spine. The quality of the pain is described as shooting and aching. The pain radiates to the right thigh. The pain is at a severity of 8/10. The pain is moderate. The symptoms are aggravated by twisting and bending. Pertinent negatives include no chest pain, no fever, no numbness, no weight loss, no abdominal pain, no bladder incontinence, no dysuria, no pelvic pain, no paresthesias, no paresis, no tingling and no weakness. She has tried NSAIDs (ONE ADVIL LAST NIGHT) for the symptoms.    Past Medical History  Diagnosis Date  . Depression   . H/O: depression   . H/O candidiasis   . H/O rubella   . H/O varicella   . Hx: UTI (urinary tract infection)   . GERD (gastroesophageal reflux disease)   . H/O: obesity   . H/O migraine   . Abnormal Pap smear     Colpo  . ASCUS with positive high risk HPV   . Miscarriage     3/13  and 7/13     Past Surgical History  Procedure Date  . Cholecystectomy 2008  . Hiatal hernia repair 2012  . Wisdom tooth extraction     Family History  Problem Relation Age of Onset  . Diabetes Maternal Grandmother   . Hypertension Maternal Grandmother     History  Substance Use Topics  . Smoking status: Never Smoker   . Smokeless tobacco: Never Used  . Alcohol Use: No    OB History    Grav Para Term Preterm Abortions TAB SAB Ect Mult Living   9 6 4 2 2  2   6       Review of Systems  Constitutional: Positive for activity change. Negative for fever, chills, weight loss, diaphoresis and fatigue.  Cardiovascular: Negative for chest pain.  Gastrointestinal: Negative for vomiting, abdominal pain, anal bleeding and rectal pain.  Genitourinary: Negative for bladder incontinence, dysuria, flank pain, enuresis, genital sores and pelvic pain.  Musculoskeletal: Positive for back pain. Negative for joint swelling.  Skin: Negative for color change, pallor, rash and wound.  Neurological: Negative for tingling, weakness, numbness and paresthesias.    Allergies  Codeine; Oxycodone; and Vicodin  Home Medications   Current Outpatient Rx  Name  Route  Sig  Dispense  Refill  . CLOBETASOL PROPIONATE 0.05 % EX CREA   Topical   Apply 1 application topically 2 (two) times daily.         Marland Kitchen  CYCLOBENZAPRINE HCL 10 MG PO TABS   Oral   Take 1 tablet (10 mg total) by mouth 3 (three) times daily as needed for muscle spasms.   20 tablet   0   . MELATONIN 5 MG PO CAPS   Oral   Take 1 capsule by mouth at bedtime. Take 30 minutes before bedtime         . MELOXICAM 7.5 MG PO TABS   Oral   Take 1 tablet (7.5 mg total) by mouth daily. Take one tablet daily for 2 weeks   14 tablet   0     BP 128/86  Pulse 78  Temp 99.3 F (37.4 C) (Oral)  Resp 20  SpO2 99%  LMP 05/23/2012  Breastfeeding? Unknown  Physical Exam  Vitals reviewed. Constitutional: Vital signs are normal. She  appears well-developed and well-nourished.  Non-toxic appearance. She does not have a sickly appearance. She does not appear ill. No distress.  HENT:  Head: Normocephalic.  Neck: Neck supple.  Musculoskeletal: She exhibits tenderness.       Lumbar back: She exhibits decreased range of motion, tenderness, bony tenderness, swelling and pain. She exhibits no edema, no laceration, no spasm and normal pulse.       Back:  Neurological: She is alert.  Skin: No rash noted. No erythema.    ED Course  Procedures (including critical care time)  Labs Reviewed - No data to display Dg Lumbar Spine Complete  11/28/2012  *RADIOLOGY REPORT*  Clinical Data: Back pain.  Injury.  LUMBAR SPINE - COMPLETE 4+ VIEW  Comparison: None.  Findings: No vertebral compression deformity.  Anatomic alignment. Lumbosacral spine is transitional.  No obvious pars defect.  The two most inferior discs are mildly narrowed.  Disc height is otherwise preserved.  IMPRESSION: No acute bony pathology.  Mild degenerative change.   Original Report Authenticated By: Jolaine Click, M.D.      1. Back pain with radiation       MDM  Lumbar strain- No neurological deficits. Rx of Meloxicam  and  Flexeril- instrucetd to folllow-up with PCP or orthopedic doctor for, if pain persists        Jimmie Molly, MD 11/28/12 1621

## 2012-11-28 NOTE — ED Notes (Signed)
Above  Mastectomy  Note  Charted  In  error

## 2012-11-28 NOTE — ED Notes (Signed)
C/o low back pain while getting a chicken out of the oven.  Felt a pop last night and then pain started radiating down back of both legs to mid thigh.

## 2012-11-28 NOTE — ED Notes (Signed)
Pt  Has  Dressing  r  Side  From  Mastectomy  One  Month  ago

## 2012-12-07 NOTE — L&D Delivery Note (Signed)
Delivery Note At 5:12 AM a viable female was delivered via Vaginal, Spontaneous Delivery (Presentation: Right Occiput Anterior).  Nuchal cord x 1 noted, summersault maneuver used, resolved easily.  APGAR: 9, 9; weight pending.   Placenta status: Intact, Spontaneous.  Cord: 3 vessels with the following complications: None.  Cord pH: not collected.  Anesthesia: None  Episiotomy: None Lacerations: Abrasion to vaginal wall, hemostatic Suture Repair: n/a Est. Blood Loss (mL): 300  Mom to postpartum.  Baby to skin to skin immediately after delivery.  Routine [facility] PP orders Planning POPs for BC  Shelly Lane 10/05/2013, 5:48 AM

## 2013-02-04 ENCOUNTER — Telehealth: Payer: Self-pay | Admitting: Obstetrics and Gynecology

## 2013-02-04 NOTE — Telephone Encounter (Signed)
TC from pt, states she is pregnant, has 6 previous children, and hx miscarriage in September, unsure of LMP or how far along, states had bleeding "sometime in January" c/o "carpal tunnel" symptoms, and wants to know if there is a "shot" she can get for it. Explained to pt nothing I could recommend at this time. Recommend f/u w primary care dr. Call to schedule OB interview at [facility] this week.

## 2013-02-06 ENCOUNTER — Telehealth: Payer: Self-pay | Admitting: Obstetrics and Gynecology

## 2013-02-06 ENCOUNTER — Ambulatory Visit: Payer: Medicaid Other | Admitting: Family Medicine

## 2013-02-06 ENCOUNTER — Encounter: Payer: Self-pay | Admitting: Family Medicine

## 2013-02-06 ENCOUNTER — Ambulatory Visit: Payer: Medicaid Other

## 2013-02-06 ENCOUNTER — Other Ambulatory Visit: Payer: Self-pay | Admitting: Family Medicine

## 2013-02-06 VITALS — BP 118/68 | Wt 272.0 lb

## 2013-02-06 DIAGNOSIS — Z139 Encounter for screening, unspecified: Secondary | ICD-10-CM

## 2013-02-06 DIAGNOSIS — N898 Other specified noninflammatory disorders of vagina: Secondary | ICD-10-CM

## 2013-02-06 DIAGNOSIS — Z3687 Encounter for antenatal screening for uncertain dates: Secondary | ICD-10-CM

## 2013-02-06 DIAGNOSIS — O3680X1 Pregnancy with inconclusive fetal viability, fetus 1: Secondary | ICD-10-CM

## 2013-02-06 LAB — POCT WET PREP (WET MOUNT)
Bacteria Wet Prep HPF POC: NEGATIVE
Clue Cells Wet Prep Whiff POC: NEGATIVE
Trichomonas Wet Prep HPF POC: NEGATIVE
WBC, Wet Prep HPF POC: NEGATIVE
pH: 4.5

## 2013-02-06 LAB — POCT URINALYSIS DIPSTICK
Bilirubin, UA: NEGATIVE
Glucose, UA: NEGATIVE
Ketones, UA: NEGATIVE
Leukocytes, UA: NEGATIVE
Nitrite, UA: NEGATIVE
Spec Grav, UA: 1.02
Urobilinogen, UA: NEGATIVE
pH, UA: 6

## 2013-02-06 LAB — US OB TRANSVAGINAL

## 2013-02-06 LAB — POCT URINE PREGNANCY: Preg Test, Ur: NEGATIVE

## 2013-02-06 LAB — HCG, QUANTITATIVE, PREGNANCY: hCG, Beta Chain, Quant, S: 177.7 m[IU]/mL

## 2013-02-06 NOTE — Telephone Encounter (Signed)
Pt called, states is unsure how far along she is but she had a positive test and is now experiencing some spotting and cramping today.  Pt states she saw the spotting w/ wiping, maroon in color.  The cramping is located on the lower right side and radiates down her right leg.  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  Pt worked in w/ LC today @ 1415 for eval.

## 2013-02-06 NOTE — Progress Notes (Signed)
S: Unknown LMP secondary to Depo-Provera for contraception in August 2013.  Menses has been irregular.  Patient reports cramping and spotting x 24 hours, but no clots. Pt thinks possible 5-6 weeks. Cramping mostly in RLQ (2/10) radiates to right leg, intermittently.  Denies trauma, injuries, or recent infections.   Fever on 3/2 was 100 decreased 98.9 with tylenol, afebrile since then. Pt very nervous b/c of history of 2 SAB in the last year.  Hx of gestational DM.     O: No hair secondary to shaving.  EGBUS: WNL.  No lymphadenopathy. Parous cervix with thick yellowish non odorus discharge.  Cervix pink, no CMT.  External os 0.5 cm, closed internal and thick.  Uterus appears appropriate size with good mobility. No masses or tenderness.  Adnexal without tenderness or masses.  No blood in vault.    A: UPT: positive  P: 1.  Pelvic Ultrasound for viability  U/S report: No gestational sac, no uterine abnormality, normal endometrium, normal appearing ovaries/adnexa, no free fluid.  Discussed with patient no pregnancy seen at this time, but maybe too early.          2.  Will order quant hCG, today and repeat in 48 hours.      3.  Pt instructed to call office is pain increase or bleeding starts again.  L.Carter, FNP-BC

## 2013-02-07 ENCOUNTER — Telehealth: Payer: Self-pay | Admitting: Obstetrics and Gynecology

## 2013-02-07 ENCOUNTER — Telehealth: Payer: Self-pay | Admitting: Family Medicine

## 2013-02-07 LAB — GC/CHLAMYDIA PROBE AMP
CT Probe RNA: NEGATIVE
GC Probe RNA: NEGATIVE

## 2013-02-07 NOTE — Telephone Encounter (Signed)
Please see pt msg 

## 2013-02-07 NOTE — Telephone Encounter (Signed)
S: Patient wanted further information about quant hCG.  O: Level 177.7  A: Pregnancy about 3 weeks.  P: Discussed with patient will repeat level on 02/08/13 and ensure it doubles.      Plan is expectant management of early pregnancy.

## 2013-02-07 NOTE — Telephone Encounter (Signed)
Tc to pt regarding results of QHCG per LC.  Informed @ 177.7 and would like for her to repeat the levels again tomorrow.  Pt voices agreement and already has an appt scheduled for tomorrow 02/08/13 @ 1400.

## 2013-02-07 NOTE — Telephone Encounter (Signed)
Message copied by Delon Sacramento on Tue Feb 07, 2013  9:24 AM ------      Message from: Elane Fritz      Created: Tue Feb 07, 2013  8:34 AM       Please have patient come in for repeat Quant hCG on 02/08/2013, Thanks!!! ------

## 2013-02-08 ENCOUNTER — Other Ambulatory Visit: Payer: Medicaid Other

## 2013-02-08 DIAGNOSIS — N912 Amenorrhea, unspecified: Secondary | ICD-10-CM

## 2013-02-08 LAB — URINE CULTURE
Colony Count: NO GROWTH
Organism ID, Bacteria: NO GROWTH

## 2013-02-08 LAB — HCG, QUANTITATIVE, PREGNANCY: hCG, Beta Chain, Quant, S: 365.4 m[IU]/mL

## 2013-02-09 ENCOUNTER — Telehealth: Payer: Self-pay | Admitting: Family Medicine

## 2013-02-09 NOTE — Telephone Encounter (Signed)
Pt made aware that  Hcg results increased And if she has any bleeding/problems to contact the office And will need to schedule NOB 3-4 wks  Pt states she has already scheduled NOB appt.  Pt voiced understanding.  [facility] CMA

## 2013-02-13 ENCOUNTER — Ambulatory Visit: Payer: Medicaid Other | Admitting: Obstetrics and Gynecology

## 2013-02-13 ENCOUNTER — Encounter: Payer: Self-pay | Admitting: Obstetrics and Gynecology

## 2013-02-13 DIAGNOSIS — O09219 Supervision of pregnancy with history of pre-term labor, unspecified trimester: Secondary | ICD-10-CM

## 2013-02-13 DIAGNOSIS — O09529 Supervision of elderly multigravida, unspecified trimester: Secondary | ICD-10-CM | POA: Insufficient documentation

## 2013-02-13 DIAGNOSIS — Z8632 Personal history of gestational diabetes: Secondary | ICD-10-CM

## 2013-02-13 DIAGNOSIS — Z641 Problems related to multiparity: Secondary | ICD-10-CM | POA: Insufficient documentation

## 2013-02-13 DIAGNOSIS — Z331 Pregnant state, incidental: Secondary | ICD-10-CM

## 2013-02-13 DIAGNOSIS — Z889 Allergy status to unspecified drugs, medicaments and biological substances status: Secondary | ICD-10-CM | POA: Insufficient documentation

## 2013-02-13 DIAGNOSIS — O039 Complete or unspecified spontaneous abortion without complication: Secondary | ICD-10-CM | POA: Insufficient documentation

## 2013-02-13 HISTORY — DX: Supervision of elderly multigravida, unspecified trimester: O09.529

## 2013-02-13 HISTORY — DX: Personal history of gestational diabetes: Z86.32

## 2013-02-13 MED ORDER — CITRANATAL HARMONY 27-1-250 MG PO CAPS: 1.0000 | ORAL_CAPSULE | Freq: Every day | ORAL | Status: DC

## 2013-02-13 NOTE — Progress Notes (Signed)
NOB interview completed.  PNV samples given.  Pt requests progesterone be drawn today.  Per LC ok to draw with prenatal labs and to schedule a repeat viability [place] in 2 weeks.  [place] scheduled for Tuesday 02/28/13 and f/u w/ [facility].  NOB work up scheduled for 03/08/13 @ 0900 w/ VL.  Pt advised results for progesterone should be back by tomorrow.

## 2013-02-14 ENCOUNTER — Telehealth: Payer: Self-pay | Admitting: Obstetrics and Gynecology

## 2013-02-14 LAB — PRENATAL PANEL VII
Antibody Screen: NEGATIVE
Basophils Absolute: 0 10*3/uL (ref 0.0–0.1)
Basophils Relative: 1 % (ref 0–1)
Eosinophils Absolute: 0.1 10*3/uL (ref 0.0–0.7)
Eosinophils Relative: 1 % (ref 0–5)
HCT: 35.7 % — ABNORMAL LOW (ref 36.0–46.0)
HIV: NONREACTIVE
Hemoglobin: 12 g/dL (ref 12.0–15.0)
Hepatitis B Surface Ag: NEGATIVE
Lymphocytes Relative: 29 % (ref 12–46)
Lymphs Abs: 2.5 10*3/uL (ref 0.7–4.0)
MCH: 28 pg (ref 26.0–34.0)
MCHC: 33.6 g/dL (ref 30.0–36.0)
MCV: 83.4 fL (ref 78.0–100.0)
Monocytes Absolute: 0.5 K/uL (ref 0.1–1.0)
Monocytes Relative: 6 % (ref 3–12)
Neutro Abs: 5.4 10*3/uL (ref 1.7–7.7)
Neutrophils Relative %: 63 % (ref 43–77)
Platelets: 302 10*3/uL (ref 150–400)
RBC: 4.28 MIL/uL (ref 3.87–5.11)
RDW: 15.1 % (ref 11.5–15.5)
Rh Type: POSITIVE
Rubella: 2.03 Index — ABNORMAL HIGH (ref <0.90)
WBC: 8.6 10*3/uL (ref 4.0–10.5)

## 2013-02-14 LAB — PROGESTERONE: Progesterone: 33.2 ng/mL

## 2013-02-14 NOTE — Telephone Encounter (Signed)
TC to pt per [facility] , FNP that all pt labs are normal.  Pt made aware Pt voiced understanding.  [facility] CMA

## 2013-02-16 ENCOUNTER — Other Ambulatory Visit: Payer: Medicaid Other

## 2013-02-16 ENCOUNTER — Telehealth: Payer: Self-pay

## 2013-02-16 NOTE — Telephone Encounter (Signed)
Pt was called and advised that all labs were normal, and no further labs will be needed at this time.Shelly Lane

## 2013-02-24 ENCOUNTER — Inpatient Hospital Stay (HOSPITAL_COMMUNITY): Payer: Medicaid Other

## 2013-02-24 ENCOUNTER — Inpatient Hospital Stay (HOSPITAL_COMMUNITY)
Admission: AD | Admit: 2013-02-24 | Discharge: 2013-02-24 | Disposition: A | Discharge status: Home / Self Care | Payer: Medicaid Other | Source: Ambulatory Visit | Attending: Obstetrics and Gynecology | Admitting: Obstetrics and Gynecology

## 2013-02-24 ENCOUNTER — Encounter (HOSPITAL_COMMUNITY): Payer: Self-pay

## 2013-02-24 DIAGNOSIS — O209 Hemorrhage in early pregnancy, unspecified: Secondary | ICD-10-CM | POA: Insufficient documentation

## 2013-02-24 DIAGNOSIS — R109 Unspecified abdominal pain: Secondary | ICD-10-CM | POA: Insufficient documentation

## 2013-02-24 LAB — WET PREP, GENITAL
Trich, Wet Prep: NONE SEEN
Yeast Wet Prep HPF POC: NONE SEEN

## 2013-02-24 LAB — HCG, QUANTITATIVE, PREGNANCY: hCG, Beta Chain, Quant, S: 43446 m[IU]/mL — ABNORMAL HIGH (ref <5)

## 2013-02-24 NOTE — MAU Note (Signed)
Went to [facility] about 1930 and saw pink on tissue and some blood in toilet. Cramping in back since seeing blood

## 2013-02-25 LAB — GC/CHLAMYDIA PROBE AMP
CT Probe RNA: NEGATIVE
GC Probe RNA: NEGATIVE

## 2013-02-26 NOTE — MAU Provider Note (Signed)
History     CSN: [id number]  Arrival date and time: 02/24/13 2008   None     Chief Complaint  Patient presents with  . Vaginal Bleeding  . Abdominal Cramping   HPI Comments: Pt is a [id number] at approximately 8wks, that came in because of vaginal bleeding, 1 time this evening, she noticed a pink tinged streak after wiping and what appeared to be a drop of blood in the commode. She also has some intermittent pelvic cramping, denies pain now. Pt is anxious because of 2 previous miscarriages. She has been followed at [facility] having BHCG levels and progesterone level followed which have been normal . She is scheduled for a visit and viability [place] next Tuesday. She had a previous US at the beginning of March that showed possible intrauterine GS, but was too early for definitive diagnosis.   Vaginal Bleeding Associated symptoms include abdominal pain. Pertinent negatives include no dysuria.  Abdominal Cramping Pertinent negatives include no dysuria.      Past Medical History  Diagnosis Date  . Depression   . H/O: depression   . H/O candidiasis   . H/O rubella   . H/O varicella   . Hx: UTI (urinary tract infection)   . GERD (gastroesophageal reflux disease)   . H/O: obesity   . H/O migraine   . ASCUS with positive high risk HPV   . Miscarriage     3/13 and 7/13   . Abnormal Pap smear     Colpo;Last pap 2012, ASCUS w/ negative HPV    Past Surgical History  Procedure Laterality Date  . Cholecystectomy  2008  . Hiatal hernia repair  2012  . Wisdom tooth extraction      Family History  Problem Relation Age of Onset  . Diabetes Maternal Grandmother   . Hypertension Maternal Grandmother   . Anemia Mother     History  Substance Use Topics  . Smoking status: Never Smoker   . Smokeless tobacco: Never Used  . Alcohol Use: No    Allergies:  Allergies  Allergen Reactions  . Codeine Itching  . Oxycodone Itching    Per RN report, pt has taken percocet without issues????  .  Vicodin (Hydrocodone-Acetaminophen) Itching    No prescriptions prior to admission    Review of Systems  Gastrointestinal: Positive for abdominal pain.  Genitourinary: Positive for vaginal bleeding. Negative for dysuria.  All other systems reviewed and are negative.   Physical Exam   Blood pressure 117/66, pulse 77, temperature 97.9 F (36.6 C), resp. rate 20, height 5\' 6"  (1.676 m), weight 280 lb 6.4 oz (127.189 kg), last menstrual period 01/15/2013, SpO2 100.00%.  Physical Exam  Nursing note and vitals reviewed. Constitutional: She is oriented to person, place, and time. She appears well-developed and well-nourished.  Cardiovascular: Normal rate.   Respiratory: Effort normal.  GI: Soft. She exhibits no distension. There is no tenderness.  Genitourinary: Vaginal discharge found.  sm amt pink d/c in vault, otherwise normal Bimanual exam WNL   Musculoskeletal: Normal range of motion.  Neurological: She is alert and oriented to person, place, and time. She has normal reflexes.  Skin: Skin is warm and dry.  Psychiatric: She has a normal mood and affect. Her behavior is normal.    BHCG = 43,346 US shows - SIUP +YS, +embryo, FHR 140 w CRP = [redacted]w[redacted]d w [facility] 10/14/13    MAU Course  Procedures BHCG [place]    Assessment and Plan  SIUP at [redacted]w[redacted]d, [facility]  10/14/2013 Wet prep +clue cells GC/CT sent RX flagyl 500mg  BID x7days called to [facility] on [address] D/c home  F/u keep scheduled appointment for Tuesday at office, will cancel viability [place]  Bleeding precuations  Shelly Lane M 02/26/2013, 2:39 AM

## 2013-03-01 ENCOUNTER — Encounter (HOSPITAL_COMMUNITY): Payer: Self-pay | Admitting: Emergency Medicine

## 2013-03-01 ENCOUNTER — Emergency Department (HOSPITAL_COMMUNITY): Payer: Medicaid Other

## 2013-03-01 ENCOUNTER — Emergency Department (HOSPITAL_COMMUNITY)
Admission: EM | Admit: 2013-03-01 | Discharge: 2013-03-01 | Disposition: A | Discharge status: Home / Self Care | Payer: Medicaid Other | Attending: Emergency Medicine | Admitting: Emergency Medicine

## 2013-03-01 DIAGNOSIS — Z8679 Personal history of other diseases of the circulatory system: Secondary | ICD-10-CM | POA: Insufficient documentation

## 2013-03-01 DIAGNOSIS — Z79899 Other long term (current) drug therapy: Secondary | ICD-10-CM | POA: Insufficient documentation

## 2013-03-01 DIAGNOSIS — E669 Obesity, unspecified: Secondary | ICD-10-CM | POA: Insufficient documentation

## 2013-03-01 DIAGNOSIS — Z8659 Personal history of other mental and behavioral disorders: Secondary | ICD-10-CM | POA: Insufficient documentation

## 2013-03-01 DIAGNOSIS — Z87440 Personal history of urinary (tract) infections: Secondary | ICD-10-CM | POA: Insufficient documentation

## 2013-03-01 DIAGNOSIS — R079 Chest pain, unspecified: Secondary | ICD-10-CM

## 2013-03-01 DIAGNOSIS — Z8619 Personal history of other infectious and parasitic diseases: Secondary | ICD-10-CM | POA: Insufficient documentation

## 2013-03-01 DIAGNOSIS — Z8719 Personal history of other diseases of the digestive system: Secondary | ICD-10-CM | POA: Insufficient documentation

## 2013-03-01 DIAGNOSIS — O9989 Other specified diseases and conditions complicating pregnancy, childbirth and the puerperium: Secondary | ICD-10-CM | POA: Insufficient documentation

## 2013-03-01 DIAGNOSIS — R0602 Shortness of breath: Secondary | ICD-10-CM | POA: Insufficient documentation

## 2013-03-01 LAB — CBC
HCT: 36.2 % (ref 36.0–46.0)
Hemoglobin: 12.5 g/dL (ref 12.0–15.0)
MCH: 29.5 pg (ref 26.0–34.0)
MCHC: 34.5 g/dL (ref 30.0–36.0)
MCV: 85.4 fL (ref 78.0–100.0)
Platelets: 238 10*3/uL (ref 150–400)
RBC: 4.24 MIL/uL (ref 3.87–5.11)
RDW: 14.5 % (ref 11.5–15.5)
WBC: 8.4 10*3/uL (ref 4.0–10.5)

## 2013-03-01 LAB — D-DIMER, QUANTITATIVE (NOT AT ARMC): D-Dimer, Quant: 1.76 ug/mL-FEU — ABNORMAL HIGH (ref 0.00–0.48)

## 2013-03-01 LAB — BASIC METABOLIC PANEL
BUN: 7 mg/dL (ref 6–23)
CO2: 24 mEq/L (ref 19–32)
Calcium: 9.2 mg/dL (ref 8.4–10.5)
Chloride: 103 mEq/L (ref 96–112)
Creatinine, Ser: 0.56 mg/dL (ref 0.50–1.10)
GFR calc Af Amer: >90 mL/min (ref >90)
GFR calc non Af Amer: >90 mL/min (ref >90)
Glucose, Bld: 93 mg/dL (ref 70–99)
Potassium: 3.8 mEq/L (ref 3.5–5.1)
Sodium: 137 mEq/L (ref 135–145)

## 2013-03-01 LAB — POCT I-STAT TROPONIN I: Troponin i, poc: 0 ng/mL (ref 0.00–0.08)

## 2013-03-01 MED ORDER — IOHEXOL 350 MG/ML SOLN: 100.0000 mL | Freq: Once | INTRAVENOUS | Status: DC | PRN

## 2013-03-01 MED ORDER — PANTOPRAZOLE SODIUM 20 MG PO TBEC: 20.0000 mg | DELAYED_RELEASE_TABLET | Freq: Every day | ORAL | Status: DC

## 2013-03-01 MED ORDER — IOHEXOL 350 MG/ML SOLN
100.0000 mL | Freq: Once | INTRAVENOUS | Status: AC | PRN
  Administered 2013-03-01: 100 mL via INTRAVENOUS

## 2013-03-01 MED ORDER — ACETAMINOPHEN 500 MG PO TABS: 500.0000 mg | ORAL_TABLET | Freq: Four times a day (QID) | ORAL | Status: DC | PRN

## 2013-03-01 NOTE — ED Notes (Signed)
Patient complaining of chest pain and shortness of breath that started around 0530 this morning; left-sided chest pain that radiates to left arm, back, and shoulder.  Patient reports nausea; denies vomiting, diaphoresis.  Recently started on Flagyl at [facility].

## 2013-03-01 NOTE — Progress Notes (Signed)
8:57 AM Pt is 37 yo woman who is about [redacted] weeks pregnant.  She developed chest pain this AM.  Exam shows lungs clear, heart sounds normal, abdomen soft, nontender, extremities nontender.  Lab workup showed normal EKG and chest x-ray, normal TNI, elevated D-dimer of 1.76.  [name] called Dr. Debroah Loop at [facility], who advised it was safe to do CT angio of chest to check for PE.

## 2013-03-01 NOTE — ED Provider Notes (Signed)
Medical screening examination/treatment/procedure(s) were conducted as a shared visit with non-physician practitioner(s) and myself.  I personally evaluated the patient during the encounter Pt is 37 yo woman who is about [redacted] weeks pregnant. She developed chest pain this AM. Exam shows lungs clear, heart sounds normal, abdomen soft, nontender, extremities nontender. Lab workup showed normal EKG and chest x-ray, normal TNI, elevated D-dimer of 1.76. Shelly Lane called Dr. Debroah Loop at [facility], who advised it was safe to do CT angio of chest to check for PE.        Carleene Cooper III, MD 03/01/13 2104

## 2013-03-01 NOTE — ED Provider Notes (Signed)
History     CSN: [id number]  Arrival date & time 03/01/13  7829   First MD Initiated Contact with Patient 03/01/13 [phone]      Chief Complaint  Patient presents with  . Chest Pain    (Consider location/radiation/quality/duration/timing/severity/associated sxs/prior treatment) HPI Comments: Patient is a 37 year old female who is about [redacted] weeks pregnant who presents with chest pain that started suddenly this morning around 5:30am. Patient reports laying in bed and having sudden onset of burning chest pain that was located in the left chest and radiates down left arm. The patient reports associated SOB. The pain has lasted since the onset and is gradually improving. No aggravating/allevaiting factors. Patient did not try anything for symptoms. Patient denies fever, headache, visual changes, nausea, diaphoresis, vomiting, diarrhea, abdominal pain, numbness/tingling.    Patient is a 37 y.o. female presenting with chest pain.  Chest Pain Associated symptoms: shortness of breath     Past Medical History  Diagnosis Date  . Depression   . H/O: depression   . H/O candidiasis   . H/O rubella   . H/O varicella   . Hx: UTI (urinary tract infection)   . GERD (gastroesophageal reflux disease)   . H/O: obesity   . H/O migraine   . ASCUS with positive high risk HPV   . Miscarriage     3/13 and 7/13   . Abnormal Pap smear     Colpo;Last pap 2012, ASCUS w/ negative HPV    Past Surgical History  Procedure Laterality Date  . Cholecystectomy  2008  . Hiatal hernia repair  2012  . Wisdom tooth extraction      Family History  Problem Relation Age of Onset  . Diabetes Maternal Grandmother   . Hypertension Maternal Grandmother   . Anemia Mother     History  Substance Use Topics  . Smoking status: Never Smoker   . Smokeless tobacco: Never Used  . Alcohol Use: No    OB History   Grav Para Term Preterm Abortions TAB SAB Ect Mult Living   11 6 4 2 4  4   6       Review of Systems   Respiratory: Positive for shortness of breath.   Cardiovascular: Positive for chest pain.  All other systems reviewed and are negative.    Allergies  Codeine; Oxycodone; and Vicodin  Home Medications   Current Outpatient Rx  Name  Route  Sig  Dispense  Refill  . clobetasol cream (TEMOVATE) 0.05 %   Topical   Apply 1 application topically 2 (two) times daily.         Marland Kitchen docusate sodium (COLACE) 50 MG capsule   Oral   Take by mouth 2 (two) times daily.         . metroNIDAZOLE (FLAGYL) 500 MG tablet   Oral   Take 500 mg by mouth 2 (two) times daily.         . Multiple Vitamins-Calcium (ONE-A-DAY WOMENS PO)   Oral   Take 1 tablet by mouth daily.         . Prenatal Vit-Fe Fumarate-FA (PRENATAL MULTIVITAMIN) TABS   Oral   Take 1 tablet by mouth daily at 12 noon.           BP 114/69  Pulse 73  Temp(Src) 97.9 F (36.6 C) (Oral)  Resp 18  SpO2 100%  LMP 01/15/2013  Physical Exam  Nursing note and vitals reviewed. Constitutional: She is oriented to person, place,  and time. She appears well-developed and well-nourished. No distress.  HENT:  Head: Normocephalic and atraumatic.  Eyes: Conjunctivae and EOM are normal. Pupils are equal, round, and reactive to light.  Neck: Normal range of motion. Neck supple.  Cardiovascular: Normal rate and regular rhythm.  Exam reveals no gallop and no friction rub.   No murmur heard. Pulmonary/Chest: Effort normal and breath sounds normal. She has no wheezes. She has no rales. She exhibits no tenderness.  Abdominal: Soft. She exhibits no distension. There is no tenderness. There is no rebound.  Musculoskeletal: Normal range of motion.  Neurological: She is alert and oriented to person, place, and time. Coordination normal.  Speech is goal-oriented. Moves limbs without ataxia.   Skin: Skin is warm and dry.  Psychiatric: She has a normal mood and affect. Her behavior is normal.    ED Course  Procedures (including critical  care time)  Labs Reviewed  D-DIMER, QUANTITATIVE - Abnormal; Notable for the following:    D-Dimer, Quant 1.76 (*)    All other components within normal limits  CBC  BASIC METABOLIC PANEL  POCT I-STAT TROPONIN I   Dg Chest 2 View  03/01/2013  *RADIOLOGY REPORT*  Clinical Data: Chest and left arm pain.  CHEST - 2 VIEW  Comparison: 03/31/2004.  Findings: Trachea is midline.  Heart size normal.  Lungs are clear. No pleural fluid.  Metallic curvilinear density projecting over the lower neck may be external to the patient.  IMPRESSION: No acute findings.   Original Report Authenticated By: Leanna Battles, M.D.    Ct Angio Chest Pe W/cm &/or Wo Cm  03/01/2013  *RADIOLOGY REPORT*  Clinical Data: Mid chest pain and shortness of breath.  CT ANGIOGRAPHY CHEST  Technique:  Multidetector CT imaging of the chest using the standard protocol during bolus administration of intravenous contrast. Multiplanar reconstructed images including MIPs were obtained and reviewed to evaluate the vascular anatomy.  Contrast: OMNIPAQUE IOHEXOL 350 MG/ML SOLN  Comparison: None.  Findings: Image quality is somewhat degraded by respiratory motion. No definite pulmonary embolus.  No pathologically enlarged mediastinal, hilar or axillary lymph nodes.  Heart is at the upper limits of normal in size.  No pericardial effusion.  Minimal dependent atelectasis in the lower lobes.  Lungs are otherwise clear.  No pleural fluid.  Airway is unremarkable.  Incidental imaging of the upper abdomen shows no acute findings. No worrisome lytic or sclerotic lesions.  IMPRESSION: Negative for pulmonary embolus.  No findings to explain the patient's given symptoms.   Original Report Authenticated By: Leanna Battles, M.D.      1. Chest pain       MDM  7:49 AM Chest x-ray unremarkable. Labs unremarkable. D-dimer pending.   8:44 AM Troponin and EKG unremarkable. Patient has an elevated d-dimer. I spoke with Dr. Debroah Loop of OBGYN about  further evaluation for PE. He advised to perform any test necessary to evaluate her for PE. I specifically asked about a CT chest with contrast and any harmful effects and he stated the contrast should not be harmful at this time with a 7 week pregnancy. Dr. Ignacia Palma will see the patient.   11:46 AM Patient's chest CT unremarkable for PE. Patient will be discharged with tylenol for pain. I will also prescribe Protonix for GERD possibly causing her chest pain. Vitals stable and patient afebrile. Patient will be discharged without further evaluation. Patient instructed to return with worsening or concerning symptoms.     Emilia Beck, PA-C 03/01/13 1152

## 2013-03-01 NOTE — ED Provider Notes (Signed)
8:44 AM  Date: 03/01/2013  Rate: 92  Rhythm: normal sinus rhythm  QRS Axis: normal  Intervals:   PR is 200 milliseconds, top normal for PR interval.  ST/T Wave abnormalities: normal  Conduction Disutrbances:none  Narrative Interpretation: Borderline EKG  Old EKG Reviewed: none available    Carleene Cooper III, MD 03/01/13 [phone]

## 2013-05-31 ENCOUNTER — Inpatient Hospital Stay (HOSPITAL_COMMUNITY)
Admission: AD | Admit: 2013-05-31 | Discharge: 2013-05-31 | Disposition: A | Discharge status: Home / Self Care | Payer: Medicaid Other | Source: Ambulatory Visit | Attending: Obstetrics and Gynecology | Admitting: Obstetrics and Gynecology

## 2013-05-31 ENCOUNTER — Encounter (HOSPITAL_COMMUNITY): Payer: Self-pay | Admitting: *Deleted

## 2013-05-31 DIAGNOSIS — R109 Unspecified abdominal pain: Secondary | ICD-10-CM | POA: Insufficient documentation

## 2013-05-31 DIAGNOSIS — O99891 Other specified diseases and conditions complicating pregnancy: Secondary | ICD-10-CM | POA: Insufficient documentation

## 2013-05-31 DIAGNOSIS — R197 Diarrhea, unspecified: Secondary | ICD-10-CM

## 2013-05-31 HISTORY — DX: Anemia, unspecified: D64.9

## 2013-05-31 HISTORY — DX: Dermatitis, unspecified: L30.9

## 2013-05-31 HISTORY — DX: Anxiety disorder, unspecified: F41.9

## 2013-05-31 HISTORY — DX: Gestational diabetes mellitus in pregnancy, unspecified control: O24.419

## 2013-05-31 HISTORY — DX: Headache: R51

## 2013-05-31 LAB — URINALYSIS, ROUTINE W REFLEX MICROSCOPIC
Bilirubin Urine: NEGATIVE
Glucose, UA: NEGATIVE mg/dL
Ketones, ur: NEGATIVE mg/dL
Nitrite: NEGATIVE
Protein, ur: NEGATIVE mg/dL
Specific Gravity, Urine: >1.03 — ABNORMAL HIGH (ref 1.005–1.030)
Urobilinogen, UA: 0.2 mg/dL (ref 0.0–1.0)
pH: 6 (ref 5.0–8.0)

## 2013-05-31 LAB — URINE MICROSCOPIC-ADD ON

## 2013-05-31 MED ORDER — LOPERAMIDE HCL 2 MG PO CAPS
2.0000 mg | ORAL_CAPSULE | Freq: Once | ORAL | Status: AC
  Administered 2013-05-31: 2 mg via ORAL
  Filled 2013-05-31: qty 1

## 2013-05-31 NOTE — MAU Provider Note (Signed)
History   Shelly Lane is a 36y.o. MBF who presents at [redacted]w[redacted]d after calling office to report diarrhea and cramping x3d.  No fever or chills.  No change in diet, and no recent traveling.  None of her kids have it.  She is a SAHM.  No resp c/o's.  No n/v.  States after she eats, diarrhea follows about later.   No abnl d/c, LOF, or VB.  Normal FM.  Hasn't tried any meds at home for symptoms.  No UTI or PIH s/s.  Last episode of diarrhea around 1630.  No diarrhea while in MAU. Pt asking to go home on my arrival to her room.  .. Patient Active Problem List   Diagnosis Date Noted  . Elderly multigravida with antepartum condition or complication 02/13/2013  . SAB (spontaneous abortion) x 4 02/13/2013  . Grand multipara 02/13/2013  . Hx of preterm delivery x 2, currently pregnant 02/13/2013  . Hx gestational diabetes x 2 02/13/2013  . Multiple drug allergies 02/13/2013  . Pregnant state, incidental 06/22/2012  . BULIMIA 10/21/2007  . ANXIETY 08/02/2007  . SYMPTOM, PAIN, ABDOMINAL, EPIGASTRIC 08/02/2007  . OBESITY 08/01/2007  . DEPRESSION 08/01/2007    CSN: [id number]  Arrival date and time: 05/31/13 1704   None     Chief Complaint  Patient presents with  . Diarrhea   HPI  OB History   Grav Para Term Preterm Abortions TAB SAB Ect Mult Living   10 6 4 2 3  3   6       Past Medical History  Diagnosis Date  . H/O: depression   . H/O candidiasis   . H/O rubella   . H/O varicella   . Hx: UTI (urinary tract infection)   . GERD (gastroesophageal reflux disease)   . H/O: obesity   . H/O migraine   . ASCUS with positive high risk HPV   . Miscarriage     3/13 and 7/13   . Abnormal Pap smear     Colpo;Last pap 2012, ASCUS w/ negative HPV  . Preterm labor   . Gestational diabetes   . Headache(784.0)   . Anemia     during preg  . Eczema   . Anxiety   . Depression     ok now    Past Surgical History  Procedure Laterality Date  . Cholecystectomy  2008  . Hiatal  hernia repair  2012  . Wisdom tooth extraction      Family History  Problem Relation Age of Onset  . Diabetes Maternal Grandmother   . Hypertension Maternal Grandmother   . Anemia Mother     History  Substance Use Topics  . Smoking status: Never Smoker   . Smokeless tobacco: Never Used  . Alcohol Use: No    Allergies:  Allergies  Allergen Reactions  . Codeine Itching  . Oxycodone Itching    Per RN report, pt has taken percocet without issues????  . Vicodin (Hydrocodone-Acetaminophen) Itching    Prescriptions prior to admission  Medication Sig Dispense Refill  . acetaminophen (TYLENOL) 500 MG tablet Take 1 tablet (500 mg total) by mouth every 6 (six) hours as needed for pain.  30 tablet  0  . clobetasol cream (TEMOVATE) 0.05 % Apply 1 application topically 2 (two) times daily.      Marland Kitchen ibuprofen (ADVIL,MOTRIN) 200 MG tablet Take 400 mg by mouth every 6 (six) hours as needed for pain.      . Prenatal Vit-Fe Fumarate-FA (PRENATAL  MULTIVITAMIN) TABS Take 1 tablet by mouth at bedtime.        ROS--see HPI above Physical Exam   Blood pressure 124/76, pulse 119, temperature 98.4 F (36.9 C), resp. rate 18, height 5\' 6"  (1.676 m), weight 283 lb 6.4 oz (128.549 kg), last menstrual period 01/15/2013.  Physical Exam  Constitutional: She is oriented to person, place, and time. She appears well-developed and well-nourished. No distress.  HENT:  Head: Normocephalic and atraumatic.  Head dress on  Eyes: Pupils are equal, round, and reactive to light.  Cardiovascular: Normal rate.   Respiratory: Effort normal.  GI: Soft.  Gravid; FHT's per RN; occ'l ctx on TOCO   Genitourinary:  SVE only:  Cx: long/closed/softening Vaginal walls lax  Neurological: She is alert and oriented to person, place, and time. She has normal reflexes.  Skin: Skin is warm and dry.  Moist mucous membranes  Psychiatric: She has a normal mood and affect. Her behavior is normal. Judgment and thought content  normal.   .. Results for orders placed during the hospital encounter of 05/31/13 (from the past 24 hour(s))  URINALYSIS, ROUTINE W REFLEX MICROSCOPIC     Status: Abnormal   Collection Time    05/31/13  5:55 PM      Result Value Range   Color, Urine YELLOW  YELLOW   APPearance HAZY (*) CLEAR   Specific Gravity, Urine >1.030 (*) 1.005 - 1.030   pH 6.0  5.0 - 8.0   Glucose, UA NEGATIVE  NEGATIVE mg/dL   Hgb urine dipstick TRACE (*) NEGATIVE   Bilirubin Urine NEGATIVE  NEGATIVE   Ketones, ur NEGATIVE  NEGATIVE mg/dL   Protein, ur NEGATIVE  NEGATIVE mg/dL   Urobilinogen, UA 0.2  0.0 - 1.0 mg/dL   Nitrite NEGATIVE  NEGATIVE   Leukocytes, UA TRACE (*) NEGATIVE  URINE MICROSCOPIC-ADD ON     Status: Abnormal   Collection Time    05/31/13  5:55 PM      Result Value Range   Squamous Epithelial / LPF FEW (*) RARE   WBC, UA 3-6  <3 WBC/hpf   RBC / HPF 0-2  <3 RBC/hpf   Bacteria, UA MANY (*) RARE   MAU Course  Procedures 1. immodium po x1 2. Po hydration 3. NST  Assessment and Plan  1. [redacted]w[redacted]d 2. Diarrhea x3 d (none in MAU) 3. No s/s of PTL  1. D/c'd home per pt request to continue Immodium and start BRAT diet.  Continue adequate hydration. 2. F/u Mon at [facility] as scheduled (06/05/13), or prn worsening s/s; send stool for cx if persists 3. Deferred lab to check electrolytes since pt requesting to go home  Sonoma Firkus H 05/31/2013, 8:16 PM

## 2013-05-31 NOTE — MAU Note (Signed)
Diarrhea and cramping started 3 days ago, no one at home has this.  Very thirsty, just feels dehydrated.

## 2013-05-31 NOTE — MAU Note (Signed)
Pt reports she has been having abd cramping and diarreah after she eats for the past 3 days. Called Md office and advised to come to MAU

## 2013-06-01 LAB — URINE CULTURE
Colony Count: NO GROWTH
Culture: NO GROWTH

## 2013-09-12 LAB — OB RESULTS CONSOLE GBS: GBS: NEGATIVE

## 2013-09-17 ENCOUNTER — Encounter (HOSPITAL_COMMUNITY): Payer: Self-pay | Admitting: *Deleted

## 2013-09-17 ENCOUNTER — Inpatient Hospital Stay (HOSPITAL_COMMUNITY)
Admission: AD | Admit: 2013-09-17 | Discharge: 2013-09-17 | Disposition: A | Discharge status: Home / Self Care | Payer: Medicaid Other | Source: Ambulatory Visit | Attending: Obstetrics and Gynecology | Admitting: Obstetrics and Gynecology

## 2013-09-17 DIAGNOSIS — O4700 False labor before 37 completed weeks of gestation, unspecified trimester: Secondary | ICD-10-CM | POA: Insufficient documentation

## 2013-09-17 LAB — AMNISURE RUPTURE OF MEMBRANE (ROM) NOT AT ARMC: Amnisure ROM: NEGATIVE

## 2013-09-17 MED ORDER — ZOLPIDEM TARTRATE 5 MG PO TABS
5.0000 mg | ORAL_TABLET | Freq: Once | ORAL | Status: AC
  Administered 2013-09-17: 5 mg via ORAL
  Filled 2013-09-17: qty 1

## 2013-09-17 NOTE — MAU Note (Cosign Needed)
Pt presents with complaints of contractions for a week. States she has had some leakage of fluid today and was thinking maybe her water broke.

## 2013-09-17 NOTE — MAU Note (Signed)
S Lillard CNM notified of amnisure results. Pt may stay off monitor. Will recheck patient around 1930.

## 2013-09-17 NOTE — MAU Provider Note (Signed)
History     CSN: [id number]  Arrival date and time: 09/17/13 1731   None     Chief Complaint  Patient presents with  . Contractions   HPI Comments: PT is a [id number] at [redacted]w[redacted]d arrives for labor check, reports regular ctx the last hour, denies any bleeding or leaking fluid, although just prior to exam noted perineum to be wet, pt then stated that she noticed her mucous plug came out this AM and had been occ more wet, but denies large gush of fluid or saturating a pad or clothing.      Past Medical History  Diagnosis Date  . H/O: depression   . H/O candidiasis   . H/O rubella   . H/O varicella   . Hx: UTI (urinary tract infection)   . GERD (gastroesophageal reflux disease)   . H/O: obesity   . H/O migraine   . ASCUS with positive high risk HPV   . Miscarriage     3/13 and 7/13   . Abnormal Pap smear     Colpo;Last pap 2012, ASCUS w/ negative HPV  . Preterm labor   . Gestational diabetes   . Headache(784.0)   . Anemia     during preg  . Eczema   . Anxiety   . Depression     ok now    Past Surgical History  Procedure Laterality Date  . Cholecystectomy  2008  . Hiatal hernia repair  2012  . Wisdom tooth extraction      Family History  Problem Relation Age of Onset  . Diabetes Maternal Grandmother   . Hypertension Maternal Grandmother   . Anemia Mother     History  Substance Use Topics  . Smoking status: Never Smoker   . Smokeless tobacco: Never Used  . Alcohol Use: No    Allergies:  Allergies  Allergen Reactions  . Codeine Itching  . Oxycodone Itching    Per RN report, pt has taken percocet without issues????    Prescriptions prior to admission  Medication Sig Dispense Refill  . clobetasol cream (TEMOVATE) 0.05 % Apply 1 application topically 2 (two) times daily.      Marland Kitchen HYDROcodone-acetaminophen (NORCO/VICODIN) 5-325 MG per tablet Take 1 tablet by mouth every 6 (six) hours as needed for pain.      . Prenatal Vit-Fe Fumarate-FA (PRENATAL  MULTIVITAMIN) TABS Take 1 tablet by mouth at bedtime.      . sertraline (ZOLOFT) 50 MG tablet Take 50 mg by mouth daily.        Review of Systems  All other systems reviewed and are negative.   Physical Exam   Blood pressure 119/77, pulse 115, temperature 97.8 F (36.6 C), temperature source Oral, resp. rate 16, last menstrual period 01/15/2013.  Physical Exam  Nursing note and vitals reviewed. Constitutional: She is oriented to person, place, and time. She appears well-developed and well-nourished.  HENT:  Head: Normocephalic.  Cardiovascular: Normal rate.   Respiratory: Effort normal.  GI: Soft.  Genitourinary: Vagina normal.  Musculoskeletal: Normal range of motion.  Neurological: She is alert and oriented to person, place, and time. She has normal reflexes.  Skin: Skin is warm and dry.  Psychiatric: She has a normal mood and affect. Her behavior is normal.    MAU Course  Procedures    Assessment and Plan  IUP at [redacted]w[redacted]d Fern equivocal  amnisure neg No cervical change after 2 hours (was 3cm at office) vtx ballottable  FHR cat 1 toco irregular  Pt has scheduled appt tmrw, told pt to call office and reschedule for end of the week  rv'd [facility]KC and labor sx's dc'd home stable condition  Pt given ambien 5mg  PO before discharge   Wynter Grave M 09/17/2013, 8:31 PM

## 2013-09-21 ENCOUNTER — Inpatient Hospital Stay (HOSPITAL_COMMUNITY): Payer: Medicaid Other

## 2013-09-21 ENCOUNTER — Inpatient Hospital Stay (HOSPITAL_COMMUNITY)
Admission: AD | Admit: 2013-09-21 | Discharge: 2013-09-21 | Disposition: A | Discharge status: Home / Self Care | Payer: Medicaid Other | Source: Ambulatory Visit | Attending: Obstetrics and Gynecology | Admitting: Obstetrics and Gynecology

## 2013-09-21 ENCOUNTER — Encounter (HOSPITAL_COMMUNITY): Payer: Self-pay | Admitting: *Deleted

## 2013-09-21 DIAGNOSIS — O4700 False labor before 37 completed weeks of gestation, unspecified trimester: Secondary | ICD-10-CM | POA: Insufficient documentation

## 2013-09-21 DIAGNOSIS — O4690 Antepartum hemorrhage, unspecified, unspecified trimester: Secondary | ICD-10-CM | POA: Insufficient documentation

## 2013-09-21 LAB — OB RESULTS CONSOLE GC/CHLAMYDIA
Chlamydia: NEGATIVE
Gonorrhea: NEGATIVE

## 2013-09-21 LAB — WET PREP, GENITAL
Clue Cells Wet Prep HPF POC: NONE SEEN
Trich, Wet Prep: NONE SEEN
Yeast Wet Prep HPF POC: NONE SEEN

## 2013-09-21 MED ORDER — BUTORPHANOL TARTRATE 1 MG/ML IJ SOLN
2.0000 mg | Freq: Once | INTRAMUSCULAR | Status: DC
  Filled 2013-09-21: qty 2

## 2013-09-21 MED ORDER — BUTORPHANOL TARTRATE 1 MG/ML IJ SOLN
2.0000 mg | Freq: Once | INTRAMUSCULAR | Status: AC
  Administered 2013-09-21: 2 mg via INTRAMUSCULAR

## 2013-09-21 MED ORDER — HYDROCODONE-ACETAMINOPHEN 5-325 MG PO TABS: 1.0000 | ORAL_TABLET | Freq: Four times a day (QID) | ORAL | Status: DC | PRN

## 2013-09-21 NOTE — MAU Note (Signed)
Last night was having diarrhea.  Then started contracting, continued all night, around 0500- became painful. Slowed down around 0500.  Slept, around 1100- woke up- was wet- had a gush of blood, then became pink, now not seeing any. (no hx of low lying or previa) Feeling more like period cramps now.

## 2013-09-21 NOTE — Progress Notes (Signed)
History   Pt seen by Dr. Su Hilt and care passed on to nightshift.  Chief Complaint  Patient presents with  . Labor Eval  . Vaginal Bleeding  . Diarrhea     Physical Exam   Blood pressure 105/64, pulse 113, temperature 98.1 F (36.7 C), temperature source Oral, resp. rate 18, height 5' 5.25" (1.657 m), weight 266 lb (120.657 kg), last menstrual period 01/15/2013.  Dilation: 3.5 Effacement (%): 50 Cervical Position: Middle Station: -2 Presentation: Vertex Exam by:: Latoia Eyster, CNM  ED Course  IUP at [redacted]w[redacted]d Bleeding ?Labor  BPP - 8/8 AFI - WNL Wet prep - neg  No cervical change over 4 hours C/w Dr. Estanislado Pandy D/c home with labor precautions Rx'd Norco 5-325mg  1 tab Q6 hours PRN F/u on 10/23 with an already scheduled ROB at the office   Haroldine Laws CNM, MSN 09/21/2013 9:15 PM

## 2013-09-21 NOTE — MAU Provider Note (Signed)
History     CSN: [id number]  Arrival date and time: 09/21/13 1353   First Provider Initiated Contact with Patient 09/21/13 1602    Pt presents c/o gush of blood this morning.  She has not continued to bleed and denies any leaking.  Good FM and reports occas ctxs.  Chief Complaint  Patient presents with  . Labor Eval  . Vaginal Bleeding  . Diarrhea   HPI  OB History   Grav Para Term Preterm Abortions TAB SAB Ect Mult Living   10 6 4 2 3  3   6       Past Medical History  Diagnosis Date  . H/O: depression   . H/O candidiasis   . H/O rubella   . H/O varicella   . Hx: UTI (urinary tract infection)   . GERD (gastroesophageal reflux disease)   . H/O: obesity   . H/O migraine   . ASCUS with positive high risk HPV   . Miscarriage     3/13 and 7/13   . Abnormal Pap smear     Colpo;Last pap 2012, ASCUS w/ negative HPV  . Preterm labor   . Gestational diabetes   . Headache(784.0)   . Anemia     during preg  . Eczema   . Anxiety   . Depression     ok now    Past Surgical History  Procedure Laterality Date  . Cholecystectomy  2008  . Hiatal hernia repair  2012  . Wisdom tooth extraction      Family History  Problem Relation Age of Onset  . Diabetes Maternal Grandmother   . Hypertension Maternal Grandmother   . Anemia Mother     History  Substance Use Topics  . Smoking status: Never Smoker   . Smokeless tobacco: Never Used  . Alcohol Use: No    Allergies:  Allergies  Allergen Reactions  . Codeine Itching  . Oxycodone Itching    Per RN report, pt has taken percocet without issues????    Prescriptions prior to admission  Medication Sig Dispense Refill  . clobetasol cream (TEMOVATE) 0.05 % Apply 1 application topically 2 (two) times daily.      Marland Kitchen HYDROcodone-acetaminophen (NORCO/VICODIN) 5-325 MG per tablet Take 1 tablet by mouth every 6 (six) hours as needed for pain.      . Prenatal Vit-Fe Fumarate-FA (PRENATAL MULTIVITAMIN) TABS Take 1 tablet by  mouth at bedtime.      . sertraline (ZOLOFT) 50 MG tablet Take 50 mg by mouth daily.      Marland Kitchen zolpidem (AMBIEN) 5 MG tablet Take 5 mg by mouth at bedtime as needed for sleep.        ROS  Denies fever, chills, n/v, headache, visual changes or RUQ pain  Physical Exam   Blood pressure 106/67, pulse 108, temperature 98.1 F (36.7 C), temperature source Oral, resp. rate 20, height 5' 5.25" (1.657 m), weight 120.657 kg (266 lb), last menstrual period 01/15/2013.  Physical Exam  Lungs CTA CV RRR Abd soft, gravid, NT Ext no calf tenderness Spec white d/c no vag bleeding VE 3-4/50/-2 FHT 140s, mod variability, + accels, no decels TOCO q 2-88min  MAU Course  Procedures  BPP with AFI GC/CT Pt reports GBS neg from office Wet prep  Assessment and Plan  P6 at 36 5/7 wks being evaluated for 3rd trimester bleeding/Labor.  Fetal status is overall reassuring with Cat 1 tracing.  Awaiting BPP and AFI.  Will reeval for labor after  ultrasound.  Purcell Nails 09/21/2013, 4:35 PM

## 2013-09-22 LAB — GC/CHLAMYDIA PROBE AMP
CT Probe RNA: NEGATIVE
GC Probe RNA: NEGATIVE

## 2013-10-04 ENCOUNTER — Inpatient Hospital Stay (HOSPITAL_COMMUNITY)
Admission: AD | Admit: 2013-10-04 | Discharge: 2013-10-07 | DRG: 775 | Disposition: A | Discharge status: Home / Self Care | Payer: Medicaid Other | Source: Ambulatory Visit | Attending: Obstetrics and Gynecology | Admitting: Obstetrics and Gynecology

## 2013-10-04 ENCOUNTER — Encounter (HOSPITAL_COMMUNITY): Payer: Self-pay | Admitting: General Practice

## 2013-10-04 DIAGNOSIS — Z8742 Personal history of other diseases of the female genital tract: Secondary | ICD-10-CM | POA: Insufficient documentation

## 2013-10-04 DIAGNOSIS — O09529 Supervision of elderly multigravida, unspecified trimester: Secondary | ICD-10-CM | POA: Diagnosis present

## 2013-10-04 MED ORDER — LIDOCAINE HCL (PF) 1 % IJ SOLN
30.0000 mL | INTRAMUSCULAR | Status: DC | PRN
  Filled 2013-10-04: qty 30

## 2013-10-04 MED ORDER — LACTATED RINGERS IV SOLN
INTRAVENOUS | Status: DC
  Administered 2013-10-05: 03:00:00 via INTRAVENOUS

## 2013-10-04 MED ORDER — OXYTOCIN 40 UNITS IN LACTATED RINGERS INFUSION - SIMPLE MED: 62.5000 mL/h | INTRAVENOUS | Status: DC

## 2013-10-04 MED ORDER — FLEET ENEMA 7-19 GM/118ML RE ENEM: 1.0000 | ENEMA | RECTAL | Status: DC | PRN

## 2013-10-04 MED ORDER — ONDANSETRON HCL 4 MG/2ML IJ SOLN: 4.0000 mg | Freq: Four times a day (QID) | INTRAMUSCULAR | Status: DC | PRN

## 2013-10-04 MED ORDER — IBUPROFEN 600 MG PO TABS
600.0000 mg | ORAL_TABLET | Freq: Four times a day (QID) | ORAL | Status: DC | PRN
  Administered 2013-10-05: 600 mg via ORAL
  Filled 2013-10-04: qty 1

## 2013-10-04 MED ORDER — LACTATED RINGERS IV SOLN: 500.0000 mL | INTRAVENOUS | Status: DC | PRN

## 2013-10-04 MED ORDER — ACETAMINOPHEN 325 MG PO TABS: 650.0000 mg | ORAL_TABLET | ORAL | Status: DC | PRN

## 2013-10-04 MED ORDER — CITRIC ACID-SODIUM CITRATE 334-500 MG/5ML PO SOLN: 30.0000 mL | ORAL | Status: DC | PRN

## 2013-10-04 MED ORDER — OXYTOCIN BOLUS FROM INFUSION
500.0000 mL | INTRAVENOUS | Status: DC
  Administered 2013-10-05: 500 mL via INTRAVENOUS

## 2013-10-04 NOTE — H&P (Signed)
Shelly Lane is a 37 y.o. female, [id number] at [redacted]w[redacted]d, presenting for active labor.  UCs every   .  Denies VB, LOF, recent fever, resp or GI c/o's, UTI or PIH s/s. GFM.  Patient Active Problem List   Diagnosis Date Noted  . History of abnormal cervical Pap smear 10/04/2013  . Elderly multigravida with antepartum condition or complication 02/13/2013  . SAB (spontaneous abortion) x 4 02/13/2013  . Grand multipara 02/13/2013  . Hx of preterm delivery x 2, currently pregnant 02/13/2013  . Hx gestational diabetes x 2 02/13/2013  . Multiple drug allergies 02/13/2013  . Pregnant state, incidental 06/22/2012  . BULIMIA 10/21/2007  . ANXIETY 08/02/2007  . SYMPTOM, PAIN, ABDOMINAL, EPIGASTRIC 08/02/2007  . OBESITY 08/01/2007  . DEPRESSION 08/01/2007    History of present pregnancy: Patient entered care at 8 weeks.   EDC of 10/14/13 was established by 6 weeks u/s.   Anatomy scan:  19 weeks, with normal findings and an posterior placenta.   Additional [place] evaluations:  Growth at [redacted]w[redacted]d - EFW 62nd%ile, AFI 40th%ile, vtx, posterior placenta.   Significant prenatal events:  17-P started at 16 weeks.   Last evaluation:  10/04/13 at [redacted]w[redacted]d  4cm/60%/-1  OB History   Grav Para Term Preterm Abortions TAB SAB Ect Mult Living   10 6 4 2 3  3   6      Past Medical History  Diagnosis Date  . H/O: depression   . H/O candidiasis   . H/O rubella   . H/O varicella   . Hx: UTI (urinary tract infection)   . GERD (gastroesophageal reflux disease)   . H/O: obesity   . H/O migraine   . ASCUS with positive high risk HPV   . Miscarriage     3/13 and 7/13   . Abnormal Pap smear     Colpo;Last pap 2012, ASCUS w/ negative HPV  . Preterm labor   . Gestational diabetes   . Headache(784.0)   . Anemia     during preg  . Eczema   . Anxiety   . Depression     ok now   Past Surgical History  Procedure Laterality Date  . Cholecystectomy  2008  . Hiatal hernia repair  2012  . Wisdom tooth extraction      Family History: family history includes Anemia in her mother; Diabetes in her maternal grandmother; Hypertension in her maternal grandmother. Social History:  reports that she has never smoked. She has never used smokeless tobacco. She reports that she does not drink alcohol or use illicit drugs.   Prenatal Transfer Tool  Maternal Diabetes: No Genetic Screening: Normal Maternal Ultrasounds/Referrals: Normal Fetal Ultrasounds or other Referrals:  None Maternal Substance Abuse:  No Significant Maternal Medications:  Meds include: Zoloft Significant Maternal Lab Results: Lab values include: Group B Strep negative    ROS: see HPI above, all other systems are negative  Allergies  Allergen Reactions  . Codeine Itching  . Oxycodone Itching    Per RN report, pt has taken percocet without issues????     Dilation: 7 Station: -2 Exam by:: j. Pavel Gadd, cnm Blood pressure 118/49, pulse 96, temperature 98.2 F (36.8 C), temperature source Oral, resp. rate 20, height 5\' 6"  (1.676 m), weight 266 lb (120.657 kg), last menstrual period 01/15/2013.  Chest clear Heart RRR without murmur Abd gravid, NT Ext: WNL  FHR: Reactive NST UCs:  Irregular Q 2-7 min  Prenatal labs: ABO, Rh: O/POS/-- (03/10 1218) Antibody: NEG (  03/10 1218) Rubella:   Immune RPR: NON REAC (03/10 1218)  HBsAg: NEGATIVE (03/10 1218)  HIV: NON REACTIVE (03/10 1218)  GBS: Negative (10/07 0000) Sickle cell/Hgb electrophoresis:  n/a Pap:  Last 06/2012 WNL GC:  Neg Chlamydia:  Neg Genetic screenings:  Harmony - low risk; AFP neg Glucola:  3 hr at around 18 weeks - WNL; 3 hr at 28 weeks - WNL  Other:  O&P, stool - neg    Assessment/Plan: IUP at [redacted]w[redacted]d Active labor GBS neg Grand multipara AMA Desires no epidural or meds  Admit to BS per c/w Dr. Richardson Dopp as attending MD Routine L&D orders AROM when appropriate   Rowan Blase, MSN 10/04/2013, 11:28 PM

## 2013-10-04 NOTE — MAU Note (Signed)
Pt G10 P6 at 38.4wks having contractions every .

## 2013-10-05 ENCOUNTER — Encounter (HOSPITAL_COMMUNITY): Payer: Self-pay

## 2013-10-05 LAB — CBC
HCT: 35.2 % — ABNORMAL LOW (ref 36.0–46.0)
Hemoglobin: 12 g/dL (ref 12.0–15.0)
MCH: 28.5 pg (ref 26.0–34.0)
MCHC: 34.1 g/dL (ref 30.0–36.0)
MCV: 83.6 fL (ref 78.0–100.0)
Platelets: 209 10*3/uL (ref 150–400)
RBC: 4.21 MIL/uL (ref 3.87–5.11)
RDW: 15.4 % (ref 11.5–15.5)
WBC: 12.8 10*3/uL — ABNORMAL HIGH (ref 4.0–10.5)

## 2013-10-05 LAB — PREPARE RBC (CROSSMATCH)

## 2013-10-05 LAB — RPR: RPR Ser Ql: NONREACTIVE

## 2013-10-05 MED ORDER — WITCH HAZEL-GLYCERIN EX PADS: 1.0000 "application " | MEDICATED_PAD | CUTANEOUS | Status: DC | PRN

## 2013-10-05 MED ORDER — FENTANYL CITRATE 0.05 MG/ML IJ SOLN
INTRAMUSCULAR | Status: AC
  Administered 2013-10-05: 50 ug via INTRAVENOUS
  Filled 2013-10-05: qty 2

## 2013-10-05 MED ORDER — FENTANYL CITRATE 0.05 MG/ML IJ SOLN
50.0000 ug | Freq: Once | INTRAMUSCULAR | Status: AC
  Administered 2013-10-05: 50 ug via INTRAVENOUS

## 2013-10-05 MED ORDER — TERBUTALINE SULFATE 1 MG/ML IJ SOLN: 0.2500 mg | Freq: Once | INTRAMUSCULAR | Status: DC | PRN

## 2013-10-05 MED ORDER — ONDANSETRON HCL 4 MG PO TABS: 4.0000 mg | ORAL_TABLET | ORAL | Status: DC | PRN

## 2013-10-05 MED ORDER — IBUPROFEN 600 MG PO TABS
600.0000 mg | ORAL_TABLET | Freq: Four times a day (QID) | ORAL | Status: DC
  Administered 2013-10-05 – 2013-10-07 (×8): 600 mg via ORAL
  Filled 2013-10-05 (×8): qty 1

## 2013-10-05 MED ORDER — TETANUS-DIPHTH-ACELL PERTUSSIS 5-2.5-18.5 LF-MCG/0.5 IM SUSP: 0.5000 mL | Freq: Once | INTRAMUSCULAR | Status: DC

## 2013-10-05 MED ORDER — MISOPROSTOL 200 MCG PO TABS
ORAL_TABLET | ORAL | Status: AC
  Filled 2013-10-05: qty 5

## 2013-10-05 MED ORDER — SENNOSIDES-DOCUSATE SODIUM 8.6-50 MG PO TABS
2.0000 | ORAL_TABLET | ORAL | Status: DC
Start: 2013-10-06 — End: 2013-10-07
  Administered 2013-10-05 – 2013-10-07 (×2): 2 via ORAL
  Filled 2013-10-05 (×2): qty 2

## 2013-10-05 MED ORDER — SERTRALINE HCL 50 MG PO TABS
50.0000 mg | ORAL_TABLET | Freq: Every day | ORAL | Status: DC
  Administered 2013-10-05 – 2013-10-07 (×3): 50 mg via ORAL
  Filled 2013-10-05 (×3): qty 1

## 2013-10-05 MED ORDER — ZOLPIDEM TARTRATE 5 MG PO TABS: 5.0000 mg | ORAL_TABLET | Freq: Every evening | ORAL | Status: DC | PRN

## 2013-10-05 MED ORDER — HYDROCODONE-ACETAMINOPHEN 5-325 MG PO TABS
1.0000 | ORAL_TABLET | Freq: Four times a day (QID) | ORAL | Status: DC | PRN
  Administered 2013-10-05 (×2): 1 via ORAL
  Administered 2013-10-05: 2 via ORAL
  Administered 2013-10-05: 1 via ORAL
  Administered 2013-10-06 – 2013-10-07 (×5): 2 via ORAL
  Filled 2013-10-05 (×2): qty 2
  Filled 2013-10-05 (×2): qty 1
  Filled 2013-10-05: qty 2
  Filled 2013-10-05: qty 1
  Filled 2013-10-05 (×3): qty 2
  Filled 2013-10-05: qty 1

## 2013-10-05 MED ORDER — PRENATAL MULTIVITAMIN CH
1.0000 | ORAL_TABLET | Freq: Every day | ORAL | Status: DC
  Administered 2013-10-05 – 2013-10-06 (×2): 1 via ORAL
  Filled 2013-10-05 (×2): qty 1

## 2013-10-05 MED ORDER — DIPHENHYDRAMINE HCL 25 MG PO CAPS: 25.0000 mg | ORAL_CAPSULE | Freq: Four times a day (QID) | ORAL | Status: DC | PRN

## 2013-10-05 MED ORDER — SIMETHICONE 80 MG PO CHEW: 80.0000 mg | CHEWABLE_TABLET | ORAL | Status: DC | PRN

## 2013-10-05 MED ORDER — DIBUCAINE 1 % RE OINT
1.0000 "application " | TOPICAL_OINTMENT | RECTAL | Status: DC | PRN
  Administered 2013-10-07: 1 via RECTAL
  Filled 2013-10-05: qty 28

## 2013-10-05 MED ORDER — INFLUENZA VAC SPLIT QUAD 0.5 ML IM SUSP
0.5000 mL | INTRAMUSCULAR | Status: AC
  Administered 2013-10-07: 0.5 mL via INTRAMUSCULAR
  Filled 2013-10-05: qty 0.5

## 2013-10-05 MED ORDER — BENZOCAINE-MENTHOL 20-0.5 % EX AERO
1.0000 "application " | INHALATION_SPRAY | CUTANEOUS | Status: DC | PRN
  Administered 2013-10-07: 1 via TOPICAL
  Filled 2013-10-05: qty 56

## 2013-10-05 MED ORDER — LANOLIN HYDROUS EX OINT: TOPICAL_OINTMENT | CUTANEOUS | Status: DC | PRN

## 2013-10-05 MED ORDER — OXYTOCIN 40 UNITS IN LACTATED RINGERS INFUSION - SIMPLE MED
1.0000 m[IU]/min | INTRAVENOUS | Status: DC
  Administered 2013-10-05: 1 m[IU]/min via INTRAVENOUS
  Filled 2013-10-05: qty 1000

## 2013-10-05 MED ORDER — ONDANSETRON HCL 4 MG/2ML IJ SOLN: 4.0000 mg | INTRAMUSCULAR | Status: DC | PRN

## 2013-10-05 NOTE — Progress Notes (Signed)
UR chart review completed.  

## 2013-10-05 NOTE — Progress Notes (Signed)
  Subjective: UCs have spaced out and not as intense.    Objective: BP 98/51  Pulse 91  Temp(Src) 98.1 F (36.7 C) (Oral)  Resp 18  Ht 5\' 6"  (1.676 m)  Wt 266 lb (120.657 kg)  BMI 42.95 kg/m2  LMP 01/15/2013      FHT:  Cat I UC:  Irregular, mild  SVE:   Dilation: 7 Station: -2 Exam by:: j. Kennethia Lynes, cnm  Assessment / Plan:  C/w Dr. Richardson Dopp  Labor: Labor dystocia; Pitocin initiated; CTO labor progress Preeclampsia: no s/s Fetal Wellbeing: Cat I Pain Control: None needed at this time I/D: GBS neg; Intact; Afibrile Anticipated MOD: SVD   Shelly Lane 10/05/2013, 2:33 AM

## 2013-10-05 NOTE — Progress Notes (Signed)
Patient was referred for history of depression/anxiety.  * Referral screened out by Clinical Social Worker because none of the following criteria appear to apply:  ~ History of anxiety/depression during this pregnancy, or of post-partum depression.  ~ Diagnosis of anxiety and/or depression within last 3 years  ~ History of depression due to pregnancy loss/loss of child  OR  * Patient's symptoms currently being treated with medication (Zoloft) and/or therapy.  Please contact the Clinical Social Worker if needs arise, or by the patient's request.   

## 2013-10-06 LAB — CBC
HCT: 36.2 % (ref 36.0–46.0)
Hemoglobin: 12.2 g/dL (ref 12.0–15.0)
MCH: 28.7 pg (ref 26.0–34.0)
MCHC: 33.7 g/dL (ref 30.0–36.0)
MCV: 85.2 fL (ref 78.0–100.0)
Platelets: 192 10*3/uL (ref 150–400)
RBC: 4.25 MIL/uL (ref 3.87–5.11)
RDW: 15.4 % (ref 11.5–15.5)
WBC: 9.7 10*3/uL (ref 4.0–10.5)

## 2013-10-06 NOTE — Progress Notes (Signed)
Patient ID: Shelly Lane, female   DOB: 02/06/1976, 37 y.o.   MRN: [id number] Post Partum Day 1  Subjective: Pt sleeping both times I entered,  States does not want discharge today no complaints,  BF     Objective: Blood pressure 108/72, pulse 65, temperature 97.6 F (36.4 C), temperature source Oral, resp. rate 18, height 5\' 6"  (1.676 m), weight 266 lb (120.657 kg), last menstrual period 01/15/2013, SpO2 97.00%, unknown if currently breastfeeding.  Physical Exam:  General: NAD  Lochia: appropriate Uterine Fundus: firm Perineum: intact  DVT Evaluation: No evidence of DVT seen on physical exam. Negative Homan's sign. No significant calf/ankle edema.   Recent Labs  10/04/13 2330 10/06/13 0602  HGB 12.0 12.2  HCT 35.2* 36.2    Assessment/Plan: Plan for discharge tomorrow, Breastfeeding and Contraception undecided       LOS: 2 days   Rowyn Spilde M 10/06/2013, 1:20 PM

## 2013-10-07 LAB — TYPE AND SCREEN
ABO/RH(D): O POS
Antibody Screen: NEGATIVE
Unit division: 0
Unit division: 0

## 2013-10-07 MED ORDER — IBUPROFEN 600 MG PO TABS: 600.0000 mg | ORAL_TABLET | Freq: Four times a day (QID) | ORAL | Status: DC | PRN

## 2013-10-07 MED ORDER — NORETHINDRONE 0.35 MG PO TABS: 1.0000 | ORAL_TABLET | Freq: Every day | ORAL | Status: DC

## 2013-10-07 MED ORDER — HYDROCODONE-ACETAMINOPHEN 5-325 MG PO TABS
1.0000 | ORAL_TABLET | Freq: Four times a day (QID) | ORAL | Status: DC | PRN
Start: 2013-10-07 — End: 2014-04-04

## 2013-10-07 NOTE — Discharge Summary (Signed)
Vaginal Delivery Discharge Summary  Shelly Lane  DOB:    31-Dec-1975 MRN:    [id number] CSN:    [id number]  Date of admission:                  10/04/13  Date of discharge:                   10/07/13  Procedures this admission:  Date of Delivery: 10/05/13  Newborn Data:  Live born female  Birth Weight: 6 lb 7.5 oz (2934 g) APGAR: 9, 9  Home with mother.  History of Present Illness:  [name] is a 37 y.o. female, [id number], who presents at [redacted]w[redacted]d weeks gestation. The patient has been followed at the [facility] Gy[facility]cology division of [facility]. She was admitted onset of labor. Her pregnancy has been complicated by:  Patient Active Problem List   Diagnosis Date Noted  . NSVD (normal spontaneous vaginal delivery) 10/05/2013  . History of abnormal cervical Pap smear 10/04/2013  . Elderly multigravida with antepartum condition or complication 02/13/2013  . SAB (spontaneous abortion) x 4 02/13/2013  . Grand multipara 02/13/2013  . Hx of preterm delivery x 2, currently pregnant 02/13/2013  . Hx gestational diabetes x 2 02/13/2013  . Multiple drug allergies 02/13/2013  . Pregnant state, incidental 06/22/2012  . BULIMIA 10/21/2007  . ANXIETY 08/02/2007  . SYMPTOM, PAIN, ABDOMINAL, EPIGASTRIC 08/02/2007  . OBESITY 08/01/2007  . DEPRESSION 08/01/2007     Hospital course:  The patient was admitted for active labor.   Her labor was not complicated. She proceeded to have a vaginal delivery of a healthy infant. Her delivery was not complicated. Her postpartum course was not complicated.  She was discharged to home on postpartum day 2 doing well.  Feeding:  breast  Contraception:  oral progesterone-only contraceptive  Discharge hemoglobin:  Hemoglobin  Date Value Range Status  10/06/2013 12.2  12.0 - 15.0 g/dL Final     HCT  Date Value Range Status  10/06/2013 36.2  36.0 - 46.0 % Final    Discharge Physical  Exam:   General: alert Lochia: appropriate Uterine Fundus: firm Incision: Intact perineum DVT Evaluation: No evidence of DVT seen on physical exam. Negative Homan's sign.  Intrapartum Procedures: spontaneous vaginal delivery Postpartum Procedures: none Complications-Operative and Postpartum: none  Discharge Diagnoses: Term Pregnancy-delivered  Discharge Information:  Activity:           Per [facility] handout Diet:                routine Medications: Ibuprofen, Vicodin and Micronor Condition:      stable Instructions:   Postpartum Care After Vaginal Delivery  After you deliver your newborn (postpartum period), the usual stay in the hospital is 24 72 hours. If there were problems with your labor or delivery, or if you have other medical problems, you might be in the hospital longer.  While you are in the hospital, you will receive help and instructions on how to care for yourself and your newborn during the postpartum period.  While you are in the hospital:  Be sure to tell your nurses if you have pain or discomfort, as well as where you feel the pain and what makes the pain worse.  If you had an incision made near your vagina (episiotomy) or if you had some tearing during delivery, the nurses may put ice packs on your episiotomy or tear. The ice packs may  help to reduce the pain and swelling.  If you are breastfeeding, you may feel uncomfortable contractions of your uterus for a couple of weeks. This is normal. The contractions help your uterus get back to normal size.  It is normal to have some bleeding after delivery.  For the first 1 3 days after delivery, the flow is red and the amount may be similar to a period.  It is common for the flow to start and stop.  In the first few days, you may pass some small clots. Let your nurses know if you begin to pass large clots or your flow increases.  Do not  flush blood clots down the toilet before having the nurse look at  them.  During the next 3 10 days after delivery, your flow should become more watery and pink or brown-tinged in color.  Ten to fourteen days after delivery, your flow should be a small amount of yellowish-white discharge.  The amount of your flow will decrease over the first few weeks after delivery. Your flow may stop in 6 8 weeks. Most women have had their flow stop by 12 weeks after delivery.  You should change your sanitary pads frequently.  Wash your hands thoroughly with soap and water for at least 20 seconds after changing pads, using the toilet, or before holding or feeding your newborn.  You should feel like you need to empty your bladder within the first 6 8 hours after delivery.  In case you become weak, lightheaded, or faint, call your nurse before you get out of bed for the first time and before you take a shower for the first time.  Within the first few days after delivery, your breasts may begin to feel tender and full. This is called engorgement. Breast tenderness usually goes away within 48 72 hours after engorgement occurs. You may also notice milk leaking from your breasts. If you are not breastfeeding, do not stimulate your breasts. Breast stimulation can make your breasts produce more milk.  Spending as much time as possible with your newborn is very important. During this time, you and your newborn can feel close and get to know each other. Having your newborn stay in your room (rooming in) will help to strengthen the bond with your newborn. It will give you time to get to know your newborn and become comfortable caring for your newborn.  Your hormones change after delivery. Sometimes the hormone changes can temporarily cause you to feel sad or tearful. These feelings should not last more than a few days. If these feelings last longer than that, you should talk to your caregiver.  If desired, talk to your caregiver about methods of family planning or  contraception.  Talk to your caregiver about immunizations. Your caregiver may want you to have the following immunizations before leaving the hospital:  Tetanus, diphtheria, and pertussis (Tdap) or tetanus and diphtheria (Td) immunization. It is very important that you and your family (including grandparents) or others caring for your newborn are up-to-date with the Tdap or Td immunizations. The Tdap or Td immunization can help protect your newborn from getting ill.  Rubella immunization.  Varicella (chickenpox) immunization.  Influenza immunization. You should receive this annual immunization if you did not receive the immunization during your pregnancy. Document Released: 09/20/2007 Document Revised: 08/17/2012 Document Reviewed: 07/20/2012 [facility] Patient Information 2014 [place], [place].   Postpartum Depression and Baby Blues  The postpartum period begins right after the birth of a baby. During  this time, there is often a great amount of joy and excitement. It is also a time of considerable changes in the life of the parent(s). Regardless of how many times a mother gives birth, each child brings new challenges and dynamics to the family. It is not unusual to have feelings of excitement accompanied by confusing shifts in moods, emotions, and thoughts. All mothers are at risk of developing postpartum depression or the "baby blues." These mood changes can occur right after giving birth, or they may occur many months after giving birth. The baby blues or postpartum depression can be mild or severe. Additionally, postpartum depression can resolve rather quickly, or it can be a long-term condition. CAUSES Elevated hormones and their rapid decline are thought to be a main cause of postpartum depression and the baby blues. There are a number of hormones that radically change during and after pregnancy. Estrogen and progesterone usually decrease immediately after delivering your baby. The level of  thyroid hormone and various cortisol steroids also rapidly drop. Other factors that play a major role in these changes include major life events and genetics.  RISK FACTORS If you have any of the following risks for the baby blues or postpartum depression, know what symptoms to watch out for during the postpartum period. Risk factors that may increase the likelihood of getting the baby blues or postpartum depression include:  Havinga personal or family history of depression.  Having depression while being pregnant.  Having premenstrual or oral contraceptive-associated mood issues.  Having exceptional life stress.  Having marital conflict.  Lacking a social support network.  Having a baby with special needs.  Having health problems such as diabetes. SYMPTOMS Baby blues symptoms include:  Brief fluctuations in mood, such as going from extreme happiness to sadness.  Decreased concentration.  Difficulty sleeping.  Crying spells, tearfulness.  Irritability.  Anxiety. Postpartum depression symptoms typically begin within the first month after giving birth. These symptoms include:  Difficulty sleeping or excessive sleepiness.  Marked weight loss.  Agitation.  Feelings of worthlessness.  Lack of interest in activity or food. Postpartum psychosis is a very concerning condition and can be dangerous. Fortunately, it is rare. Displaying any of the following symptoms is cause for immediate medical attention. Postpartum psychosis symptoms include:  Hallucinations and delusions.  Bizarre or disorganized behavior.  Confusion or disorientation. DIAGNOSIS  A diagnosis is made by an evaluation of your symptoms. There are no medical or lab tests that lead to a diagnosis, but there are various questionnaires that a caregiver may use to identify those with the baby blues, postpartum depression, or psychosis. Often times, a screening tool called the [place] Postnatal Depression Scale  is used to diagnose depression in the postpartum period.  TREATMENT The baby blues usually goes away on its own in 1 to 2 weeks. Social support is often all that is needed. You should be encouraged to get adequate sleep and rest. Occasionally, you may be given medicines to help you sleep.  Postpartum depression requires treatment as it can last several months or longer if it is not treated. Treatment may include individual or group therapy, medicine, or both to address any social, physiological, and psychological factors that may play a role in the depression. Regular exercise, a healthy diet, rest, and social support may also be strongly recommended.  Postpartum psychosis is more serious and needs treatment right away. Hospitalization is often needed. HOME CARE INSTRUCTIONS  Get as much rest as you can. Nap when the  baby sleeps.  Exercise regularly. Some women find yoga and walking to be beneficial.  Eat a balanced and nourishing diet.  Do little things that you enjoy. Have a cup of tea, take a bubble bath, read your favorite magazine, or listen to your favorite music.  Avoid alcohol.  Ask for help with household chores, cooking, grocery shopping, or running errands as needed. Do not try to do everything.  Talk to people close to you about how you are feeling. Get support from your partner, family members, friends, or other new moms.  Try to stay positive in how you think. Think about the things you are grateful for.  Do not spend a lot of time alone.  Only take medicine as directed by your caregiver.  Keep all your postpartum appointments.  Let your caregiver know if you have any concerns. SEEK MEDICAL CARE IF: You are having a reaction or problems with your medicine. SEEK IMMEDIATE MEDICAL CARE IF:  You have suicidal feelings.  You feel you may harm the baby or someone else. Document Released: 08/27/2004 Document Revised: 02/15/2012 Document Reviewed: 09/29/2011 [facility]  Patient Information 2014 [place], [place].   Discharge to: home  Follow-up Information   Follow up with [facility]. Schedule an appointment as soon as possible for a visit in 6 weeks. (Call for questions or concerns.)    Specialty:  Obstetrics and Gynecology   Contact information:   [address] [place] [address] [phone]       Nigel Bridgeman 10/07/2013

## 2013-11-16 ENCOUNTER — Ambulatory Visit (HOSPITAL_COMMUNITY)
Admission: RE | Admit: 2013-11-16 | Discharge: 2013-11-16 | Disposition: A | Discharge status: Home / Self Care | Payer: Medicaid Other | Source: Ambulatory Visit | Attending: Obstetrics and Gynecology | Admitting: Obstetrics and Gynecology

## 2013-11-16 NOTE — Lactation Note (Addendum)
Adult Lactation Consultation Outpatient Visit Note  Patient Name: Shelly Lane                        "Shelly Lane"  16 5/7 weeks at birth Date of Birth: 1976-02-21                                          6 weeks                                                                                   Weight today; 7-5.3, 3326 Gestational Age at Delivery: Unknown Type of Delivery:   Breastfeeding History: Frequency of Breastfeeding: every 2-3 hours Length of Feeding: 30-60 mins Voids: QS Stools: QS  Supplementing / Method:Bottle feeds 4 times daily giving infant at least 3 onces Pumping:  Type of Pump:Lactina   Frequency:3-4 times daily  Volume: 8 ounces daily   Comments: Mother was seen at [facility] and she was referred to see Lactation services.Mother states that infant has had a poor latch since birth. She states she only feels a few strong tugs on the breast when infant is feeding and then she falls asleep. Mother states that she does switch nursing . She offers both breast two times during one feeding switching back and forth to rouse infant. Mother states that she breastfeeds during the day and then supplements with 3 ounces of formula after at least 4 feedings daily.. She states she breastfeeds infant during the night but doesn't given any supplement. Mother was given an RX for Reglan yesterday to increase milk supply. She states that she had to take Reglan with last child who was a Late Perterm infant. She states that this infant has feeding behaviors just like her last child.. She plans to take 10 mg 4 times daily Mother states she does have a history of Depression. She states that  [name] CNM  is aware of her history of depression.. Mother states that she does have anxiety about low milk supply and she really wants infant to breastfeed exclusively .     Consultation Evaluation:  Multiple attempts to get infant to sustain latch for more than 3-5 mins. On the first  breast. Infant is very fussy at the breast . She does have a small mouth. Mothers nipples are large. Infant can get the nipple shaft in her mouth but very little of the areola. Mother is constantly doing breast compression to push milk out to the infant.  An SNS was sat up on second breast and infant still had a difficult time sustaining latch.  Infant was on and off multiple times. Infant sustained latch for maybe 10 mins. While using the SNS. Infant transferred 16 ml from first breast and another 28 ml from second breast. 17 ml from SNS and 11ml from mother . Mother describes that this is normal behavior for her infant with each feeding.    Initial Feeding Assessment: Pre-feed Weight:3326 Post-feed [id number] Amount Transferred:16 ml Comments:  Additional Feeding Assessment: Pre-feed [id number] Post-feed  [id number] Amount Transferred:12ml Comments:  Additional Feeding Assessment: infant was given a bottle using a slow flow nipple by LC. Infant leaks milk the entire feeding. Parents were taught paced bottle feeding. This did help some. Infant continued to leak milk while feeding. Parents state this is common with each feeding. `  Comments:infant was given 70 ml of formula from the bottle  Total Breast milk Transferred this Visit: 27ml Total Supplement Given: 17 ml from SNS and 70ml from the bottle  Additional Interventions:   Advised mother to supplement infant after each breastfeeding  Give infant at least 3-5 ounces every 2-3 hours.  Recommend that mother focus on Making Milk with good pumping and bottle feed infant for now.  Mother to post pump for 20 mins after each feeding at least 8 times daily. Parents were taught paced bottle feeding.  Recommend that mother have outside assistance if possible to assist with 6 other children (ages 1,14, 42,9,4,3, and now newborn.) Encouraged mother to try and nap dailly, drink lots of fld.  Recommend that mother follow up weekly for weight  checks and follow up with LC in one week.     Follow-Up  December 19 at 4:00 pm    Stevan Born [facility] 11/16/2013, 1:02 PM

## 2013-11-21 ENCOUNTER — Ambulatory Visit (HOSPITAL_COMMUNITY): Payer: Medicaid Other

## 2013-11-24 ENCOUNTER — Ambulatory Visit (HOSPITAL_COMMUNITY)
Admission: RE | Admit: 2013-11-24 | Discharge: 2013-11-24 | Disposition: A | Discharge status: Home / Self Care | Payer: Medicaid Other | Source: Ambulatory Visit | Attending: Obstetrics and Gynecology | Admitting: Obstetrics and Gynecology

## 2013-11-24 NOTE — Lactation Note (Signed)
Adult Lactation Consultation Outpatient Visit Note  Patient Name: JAMMIE TROUP                                     "Zanya"                                                                                                                           Weight today: 8.4, 3744 Date of Birth: 1976-10-24                                                        [redacted] weeks Gestational Age at Delivery: Unknown Type of Delivery: vaginal del   Breastfeeding History: Frequency of Breastfeeding: every 2-3 hours Length of Feeding: 10-15 mins on each side Voids: QS Stools: QS  Supplementing / Method:infant takes 2-3 ounces of EBM/formula after each feeding.  Pumping:  Type of Pump: Lactina   Frequency:6-8 ounces  Volume:  2 ounces  Comments:Mother returns today for [facility] consult. Mother has low milk supply. Infant has a poor suck reflex. Infant does take bottle better . Milk still leaks from the side of the infants mouth.     Consultation Evaluation: infant is still chewing at breast observed short little burst of suckling and swallows. 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 This is not  a very visible tie. Infant does have good lateral tongue movement but has a difficult time elevating her tongue. Mother wants a tongue evaluation from the ENT. I am not sure this is the real problem. Mother has a low milk supply and I was hopeful that her latch would improve with an increase in supply. I do see some improvement with this consult.   Initial Feeding Assessment:infant transferred 14 ml from first breast. Mother does frequent compression and squeezes milk into infants mouth. Infant continues to pull on and off breast.  Pre-feed [id number] Post-feed [id number] Amount Transferred:14 ml Comments:  Additional Feeding Assessment:infant transferred 12 ml Pre-feed [id number] Post-feed [id number] Amount Transferred:12 ml Comments:    Total Breast milk Transferred this Visit: 26 ml Total  Supplement Given: 2 ounces of formula with a bottle  Additional Interventions:  Recommend that mother to continue to pump and work on a good milk supply. Advised mother to continue to cue base feed and supplement infant after each feeding.  Supplement infant at least 3 ounces after each feeding.  Continue to pump after each feeding at least 8 times a day for 20 mins. Suggest that mother try power pumping once daily,10 on 10 off for 60 mins. Hand express after each feeding Suggest that mother take a vacation from breast feeding for several days and just pump.  Recommend that infant follow up  to have frenula evaluated Mother to continue to take supplements to increase milk supply    Follow-Up  Follow up with [facility] services after frenula clipped     Stevan Born McCoy 11/24/2013, 4:07 PM

## 2013-12-07 NOTE — L&D Delivery Note (Signed)
Final Labor Progress Note At 0100 pt reports an increased in rectal pressure.  VE 9/C/-1 BBW.  Pt request AROM.  FHR remained reassuring with occasional variable decelerations with quick recovery to baseline at rest.     Vaginal Delivery Note Pt labored without pain management.   Artificial rupture of membranes today, at 0113, clear.  GBS was negative.  Cervical dilation was complete at  0117.  NICHD Category 2.    Spontaneous pushing began at  0123 at which time she turned to hands and knees.   After 30 seconds of pushing the head, shoulders and the body of a viable female infant "[name]" delivered spontaneously with maternal effort in the LOT position at 0124.   With vigorous tone and spontaneous cry, the infant was placed on moms abd.  After the umbilical cord was clamped it was cut by the FOB, then cord blood was obtained for evaluation.  Spontaneous delivery of a intact placenta with a 3 vessel cord via [place] at  0130.   Episiotomy: None   The vulva, perineum, vaginal vault, rectum and cervix were inspected and revealed a 1 degree perineal  which was repair using a 3-0 vicryl on a CT needle and a superficial left labial hemostatic, not repaired.  20cc of 1% lidocaine use for the repair.  Postpartum pitocin as ordered.   Uterus boggy immediately after delivery of placenta - fundal massage given, 800 mcg of Cytotec PR.   EBL 400, Pt hemodynamically stable.   Sponge, laps and needle count correct and verified with the primary care nurse.  Attending MD available at all times.    Mom and baby were left in stable condition, baby skin to skin. Routine postpartum orders   Mother desires Nexplanon for contraception   Placenta to pathology: NO     Cord Gases sent to lab: NO Cord blood sent to lab: YES   APGARS:  9 at 1 minute and 9 at 5 minutes Weight:. TBD     Shelly Lane, CNM, MSN 11/15/2014. 2:28 AM

## 2014-04-04 ENCOUNTER — Encounter (HOSPITAL_COMMUNITY): Payer: Self-pay | Admitting: *Deleted

## 2014-04-04 ENCOUNTER — Inpatient Hospital Stay (HOSPITAL_COMMUNITY)
Admission: AD | Admit: 2014-04-04 | Discharge: 2014-04-04 | Disposition: A | Discharge status: Home / Self Care | Payer: Medicaid Other | Source: Ambulatory Visit | Attending: Obstetrics and Gynecology | Admitting: Obstetrics and Gynecology

## 2014-04-04 ENCOUNTER — Inpatient Hospital Stay (HOSPITAL_COMMUNITY): Payer: Medicaid Other

## 2014-04-04 DIAGNOSIS — O26859 Spotting complicating pregnancy, unspecified trimester: Secondary | ICD-10-CM | POA: Insufficient documentation

## 2014-04-04 DIAGNOSIS — R109 Unspecified abdominal pain: Secondary | ICD-10-CM | POA: Insufficient documentation

## 2014-04-04 DIAGNOSIS — O0940 Supervision of pregnancy with grand multiparity, unspecified trimester: Secondary | ICD-10-CM

## 2014-04-04 DIAGNOSIS — M549 Dorsalgia, unspecified: Secondary | ICD-10-CM | POA: Insufficient documentation

## 2014-04-04 LAB — URINALYSIS, ROUTINE W REFLEX MICROSCOPIC
Bilirubin Urine: NEGATIVE
Glucose, UA: NEGATIVE mg/dL
Ketones, ur: NEGATIVE mg/dL
Leukocytes, UA: NEGATIVE
Nitrite: NEGATIVE
Protein, ur: NEGATIVE mg/dL
Specific Gravity, Urine: 1.025 (ref 1.005–1.030)
Urobilinogen, UA: 0.2 mg/dL (ref 0.0–1.0)
pH: 6 (ref 5.0–8.0)

## 2014-04-04 LAB — URINE MICROSCOPIC-ADD ON

## 2014-04-04 NOTE — Discharge Instructions (Signed)
Vaginal Bleeding During Pregnancy, First Trimester °A small amount of bleeding (spotting) from the vagina is relatively common in early pregnancy. It usually stops on its own. Various things may cause bleeding or spotting in early pregnancy. Some bleeding may be related to the pregnancy, and some may not. In most cases, the bleeding is normal and is not a problem. However, bleeding can also be a sign of something serious. Be sure to tell your health care provider about any vaginal bleeding right away. °Some possible causes of vaginal bleeding during the first trimester include: °· Infection or inflammation of the cervix. °· Growths (polyps) on the cervix. °· Miscarriage or threatened miscarriage. °· Pregnancy tissue has developed outside of the uterus and in a fallopian tube (tubal pregnancy). °· Tiny cysts have developed in the uterus instead of pregnancy tissue (molar pregnancy). °HOME CARE INSTRUCTIONS  °Watch your condition for any changes. The following actions may help to lessen any discomfort you are feeling: °· Follow your health care provider's instructions for limiting your activity. If your health care provider orders bed rest, you may need to stay in bed and only get up to use the bathroom. However, your health care provider may allow you to continue light activity. °· If needed, make plans for someone to help with your regular activities and responsibilities while you are on bed rest. °· Keep track of the number of pads you use each day, how often you change pads, and how soaked (saturated) they are. Write this down. °· Do not use tampons. Do not douche. °· Do not have sexual intercourse or orgasms until approved by your health care provider. °· If you pass any tissue from your vagina, save the tissue so you can show it to your health care provider. °· Only take over-the-counter or prescription medicines as directed by your health care provider. °· Do not take aspirin because it can make you  bleed. °· Keep all follow-up appointments as directed by your health care provider. °SEEK MEDICAL CARE IF: °· You have any vaginal bleeding during any part of your pregnancy. °· You have cramps or labor pains. °SEEK IMMEDIATE MEDICAL CARE IF:  °· You have severe cramps in your back or belly (abdomen). °· You have a fever, not controlled by medicine. °· You pass large clots or tissue from your vagina. °· Your bleeding increases. °· You feel lightheaded or weak, or you have fainting episodes. °· You have chills. °· You are leaking fluid or have a gush of fluid from your vagina. °· You pass out while having a bowel movement. °MAKE SURE YOU: °· Understand these instructions. °· Will watch your condition. °· Will get help right away if you are not doing well or get worse. °Document Released: 09/02/2005 Document Revised: 09/13/2013 Document Reviewed: 07/31/2013 °[employer]® Patient Information ©2014 [employer]. ° °

## 2014-04-04 NOTE — MAU Note (Signed)
[facility] to [facility]. Unsure of results. Now with lower abd cramping and spotting.

## 2014-04-04 NOTE — MAU Provider Note (Signed)
History     CSN: [id number]  Arrival date and time: 04/04/14 2109   None     Chief Complaint  Patient presents with  . Vaginal Bleeding  . Abdominal Pain   HPI Comments: Pt is a [id number] at approx 5065w6d by ?LMP of 3/12, had +UPT on 4/15, arrives unannounced w cc of cramping that started today and ongoing spotting since +UPT, noticed more watery discharge today. States she's been very active today is moving to a new house.  C/o nausea, hasn't vomited. C/o ongoing back pain, had been referred to pain mgmnt but didn't see them before +UPT.  Quant at office on 4/22 = 2231, on 4/25 =4939, hasn't had US   Vaginal Bleeding Associated symptoms include abdominal pain, back pain and nausea.  Abdominal Pain Associated symptoms include nausea.      Past Medical History  Diagnosis Date  . H/O: depression   . H/O candidiasis   . H/O rubella   . H/O varicella   . Hx: UTI (urinary tract infection)   . GERD (gastroesophageal reflux disease)   . H/O: obesity   . H/O migraine   . ASCUS with positive high risk HPV   . Miscarriage     3/13 and 7/13   . Abnormal Pap smear     Colpo;Last pap 2012, ASCUS w/ negative HPV  . Preterm labor   . Gestational diabetes   . Headache(784.0)   . Anemia     during preg  . Eczema   . Anxiety   . Depression     ok now    Past Surgical History  Procedure Laterality Date  . Cholecystectomy  2008  . Hiatal hernia repair  2012  . Wisdom tooth extraction      Family History  Problem Relation Age of Onset  . Diabetes Maternal Grandmother   . Hypertension Maternal Grandmother   . Anemia Mother     History  Substance Use Topics  . Smoking status: Never Smoker   . Smokeless tobacco: Never Used  . Alcohol Use: No    Allergies:  Allergies  Allergen Reactions  . Codeine Itching  . Oxycodone Itching    Per RN report, pt has taken percocet without issues????    Prescriptions prior to admission  Medication Sig Dispense Refill  . Prenatal  Vit-Fe Fumarate-FA (PRENATAL MULTIVITAMIN) TABS Take 1 tablet by mouth at bedtime.        Review of Systems  Gastrointestinal: Positive for nausea and abdominal pain.  Genitourinary: Positive for vaginal bleeding.  Musculoskeletal: Positive for back pain.  All other systems reviewed and are negative.  Physical Exam   Blood pressure 124/72, pulse 89, temperature 98.3 F (36.8 C), temperature source Oral, resp. rate 18, height 5\' 6"  (1.676 m), weight 281 lb (127.461 kg), last menstrual period 02/15/2014, SpO2 100.00%, unknown if currently breastfeeding.  Physical Exam  Nursing note and vitals reviewed. Constitutional: She is oriented to person, place, and time. She appears well-developed and well-nourished.  Morbidly obese   HENT:  Head: Normocephalic.  Cardiovascular: Normal rate.   Respiratory: Effort normal.  GI: Soft. She exhibits no distension. There is no tenderness. There is no rebound.  Genitourinary:  Will defer until after US   Musculoskeletal: Normal range of motion.  Neurological: She is alert and oriented to person, place, and time. She has normal reflexes.  Skin: Skin is warm and dry.  Psychiatric: She has a normal mood and affect. Her behavior is normal.  MAU Course  Procedures    Assessment and Plan  +UPT 4/12 Quant in office on 4/25 =4939, increased from 2231 on 4/22 Cramping and spotting Hx SAB x3  Grand multiparity  Will check US for viability and dating   Malissa HippoShelley M Rumaldo Difatta 04/04/2014, 9:49 PM   Addendum: at 1121PM  [place] Ob Comp Less 14 Wks  04/04/2014   CLINICAL DATA:  Vaginal spotting and pelvic cramping.  EXAM: OBSTETRIC <14 WK US AND TRANSVAGINAL OB US  TECHNIQUE: Both transabdominal and transvaginal ultrasound examinations were performed for complete evaluation of the gestation as well as the maternal uterus, adnexal regions, and pelvic cul-de-sac. Transvaginal technique was performed to assess early pregnancy.  COMPARISON:  None.  FINDINGS:  Intrauterine gestational sac: Visualized/normal in shape.  Yolk sac:  Yes  Embryo:  Yes  Cardiac Activity: Yes  Heart Rate: 103 bpm  CRL:   3.2 mm   6 w 0 d                  US EDC: 11/28/2014  Maternal uterus/adnexae: No subchorionic hemorrhage is seen.  The ovaries are unremarkable in appearance. The right ovary measures 4.8 x 2.2 x 2.3 cm, while the left ovary measures 3.1 x 2.6 x 2.2 cm. No suspicious adnexal masses are seen; there is no evidence for ovarian torsion.  No free fluid is seen within the pelvic cul-de-sac.  IMPRESSION: Single live intrauterine pregnancy noted, with a crown-rump length of 3 mm, corresponding to a gestational age of [redacted] weeks 0 days. This matches the gestational age of [redacted] weeks 6 days by LMP, reflecting an estimated date of delivery of November 22, 2014.   Electronically Signed   By: Roanna RaiderJeffery  Chang M.D.   On: 04/04/2014 23:08   [place] Ob Transvaginal  04/04/2014   CLINICAL DATA:  Vaginal spotting and pelvic cramping.  EXAM: OBSTETRIC <14 WK US AND TRANSVAGINAL OB US  TECHNIQUE: Both transabdominal and transvaginal ultrasound examinations were performed for complete evaluation of the gestation as well as the maternal uterus, adnexal regions, and pelvic cul-de-sac. Transvaginal technique was performed to assess early pregnancy.  COMPARISON:  None.  FINDINGS: Intrauterine gestational sac: Visualized/normal in shape.  Yolk sac:  Yes  Embryo:  Yes  Cardiac Activity: Yes  Heart Rate: 103 bpm  CRL:   3.2 mm   6 w 0 d                  US EDC: 11/28/2014  Maternal uterus/adnexae: No subchorionic hemorrhage is seen.  The ovaries are unremarkable in appearance. The right ovary measures 4.8 x 2.2 x 2.3 cm, while the left ovary measures 3.1 x 2.6 x 2.2 cm. No suspicious adnexal masses are seen; there is no evidence for ovarian torsion.  No free fluid is seen within the pelvic cul-de-sac.  IMPRESSION: Single live intrauterine pregnancy noted, with a crown-rump length of 3 mm, corresponding to a  gestational age of [redacted] weeks 0 days. This matches the gestational age of [redacted] weeks 6 days by LMP, reflecting an estimated date of delivery of November 22, 2014.   Electronically Signed   By: Roanna RaiderJeffery  Chang M.D.   On: 04/04/2014 23:08    Will change EDC based on US dating to 11/28/14 Pt has f/u at office 5/21, keep scheduled appointment Pelvic rest  dc'd home stable condition  S.Altovise Wahler, CNM

## 2014-04-06 LAB — CULTURE, OB URINE
Colony Count: NO GROWTH
Culture: NO GROWTH

## 2014-05-03 LAB — OB RESULTS CONSOLE RUBELLA ANTIBODY, IGM: Rubella: IMMUNE

## 2014-05-03 LAB — OB RESULTS CONSOLE GC/CHLAMYDIA
Chlamydia: NEGATIVE
Gonorrhea: NEGATIVE

## 2014-05-03 LAB — OB RESULTS CONSOLE RPR: RPR: NONREACTIVE

## 2014-05-03 LAB — OB RESULTS CONSOLE ANTIBODY SCREEN: Antibody Screen: NEGATIVE

## 2014-05-03 LAB — OB RESULTS CONSOLE HIV ANTIBODY (ROUTINE TESTING): HIV: NONREACTIVE

## 2014-05-03 LAB — OB RESULTS CONSOLE ABO/RH: RH Type: POSITIVE

## 2014-05-03 LAB — OB RESULTS CONSOLE HEPATITIS B SURFACE ANTIGEN: Hepatitis B Surface Ag: NEGATIVE

## 2014-09-18 ENCOUNTER — Ambulatory Visit: Payer: Medicaid Other | Admitting: Physical Therapy

## 2014-09-21 ENCOUNTER — Encounter (HOSPITAL_COMMUNITY): Payer: Self-pay

## 2014-09-21 ENCOUNTER — Inpatient Hospital Stay (HOSPITAL_COMMUNITY)
Admission: AD | Admit: 2014-09-21 | Discharge: 2014-09-22 | Disposition: A | Discharge status: Home / Self Care | Payer: Medicaid Other | Source: Ambulatory Visit | Attending: Obstetrics and Gynecology | Admitting: Obstetrics and Gynecology

## 2014-09-21 ENCOUNTER — Inpatient Hospital Stay (HOSPITAL_COMMUNITY): Payer: Medicaid Other

## 2014-09-21 DIAGNOSIS — O4700 False labor before 37 completed weeks of gestation, unspecified trimester: Secondary | ICD-10-CM

## 2014-09-21 DIAGNOSIS — O4703 False labor before 37 completed weeks of gestation, third trimester: Secondary | ICD-10-CM

## 2014-09-21 DIAGNOSIS — O368390 Maternal care for abnormalities of the fetal heart rate or rhythm, unspecified trimester, not applicable or unspecified: Secondary | ICD-10-CM

## 2014-09-21 DIAGNOSIS — N760 Acute vaginitis: Secondary | ICD-10-CM | POA: Diagnosis not present

## 2014-09-21 DIAGNOSIS — O09523 Supervision of elderly multigravida, third trimester: Secondary | ICD-10-CM

## 2014-09-21 DIAGNOSIS — O479 False labor, unspecified: Secondary | ICD-10-CM

## 2014-09-21 DIAGNOSIS — Z3A30 30 weeks gestation of pregnancy: Secondary | ICD-10-CM | POA: Insufficient documentation

## 2014-09-21 LAB — URINALYSIS, ROUTINE W REFLEX MICROSCOPIC
Bilirubin Urine: NEGATIVE
Glucose, UA: 100 mg/dL — AB
Ketones, ur: 15 mg/dL — AB
Leukocytes, UA: NEGATIVE
Nitrite: NEGATIVE
Protein, ur: NEGATIVE mg/dL
Specific Gravity, Urine: <1.005 — ABNORMAL LOW (ref 1.005–1.030)
Urobilinogen, UA: 0.2 mg/dL (ref 0.0–1.0)
pH: 6 (ref 5.0–8.0)

## 2014-09-21 LAB — URINE MICROSCOPIC-ADD ON

## 2014-09-21 LAB — WET PREP, GENITAL
Trich, Wet Prep: NONE SEEN
Yeast Wet Prep HPF POC: NONE SEEN

## 2014-09-21 LAB — FETAL FIBRONECTIN: Fetal Fibronectin: NEGATIVE

## 2014-09-21 MED ORDER — NIFEDIPINE 10 MG PO CAPS
10.0000 mg | ORAL_CAPSULE | Freq: Once | ORAL | Status: AC
  Administered 2014-09-21: 10 mg via ORAL
  Filled 2014-09-21: qty 1

## 2014-09-21 MED ORDER — NIFEDIPINE 10 MG PO CAPS
10.0000 mg | ORAL_CAPSULE | ORAL | Status: DC | PRN
  Administered 2014-09-21: 10 mg via ORAL
  Filled 2014-09-21: qty 1

## 2014-09-21 MED ORDER — METRONIDAZOLE 500 MG PO TABS: 500.0000 mg | ORAL_TABLET | Freq: Two times a day (BID) | ORAL | Status: AC

## 2014-09-21 NOTE — Discharge Instructions (Signed)
Bacterial Vaginosis Bacterial vaginosis is a vaginal infection that occurs when the normal balance of bacteria in the vagina is disrupted. It results from an overgrowth of certain bacteria. This is the most common vaginal infection in women of childbearing age. Treatment is important to prevent complications, especially in pregnant women, as it can cause a premature delivery. CAUSES  Bacterial vaginosis is caused by an increase in harmful bacteria that are normally present in smaller amounts in the vagina. Several different kinds of bacteria can cause bacterial vaginosis. However, the reason that the condition develops is not fully understood. RISK FACTORS Certain activities or behaviors can put you at an increased risk of developing bacterial vaginosis, including:  Having a new sex partner or multiple sex partners.  Douching.  Using an intrauterine device (IUD) for contraception. Women do not get bacterial vaginosis from toilet seats, bedding, swimming pools, or contact with objects around them. SIGNS AND SYMPTOMS  Some women with bacterial vaginosis have no signs or symptoms. Common symptoms include:  Grey vaginal discharge.  A fishlike odor with discharge, especially after sexual intercourse.  Itching or burning of the vagina and vulva.  Burning or pain with urination. DIAGNOSIS  Your health care provider will take a medical history and examine the vagina for signs of bacterial vaginosis. A sample of vaginal fluid may be taken. Your health care provider will look at this sample under a microscope to check for bacteria and abnormal cells. A vaginal pH test may also be done.  TREATMENT  Bacterial vaginosis may be treated with antibiotic medicines. These may be given in the form of a pill or a vaginal cream. A second round of antibiotics may be prescribed if the condition comes back after treatment.  HOME CARE INSTRUCTIONS   Only take over-the-counter or prescription medicines as  directed by your health care provider.  If antibiotic medicine was prescribed, take it as directed. Make sure you finish it even if you start to feel better.  Do not have sex until treatment is completed.  Tell all sexual partners that you have a vaginal infection. They should see their health care provider and be treated if they have problems, such as a mild rash or itching.  Practice safe sex by using condoms and only having one sex partner. SEEK MEDICAL CARE IF:   Your symptoms are not improving after 3 days of treatment.  You have increased discharge or pain.  You have a fever. MAKE SURE YOU:   Understand these instructions.  Will watch your condition.  Will get help right away if you are not doing well or get worse. FOR MORE INFORMATION  Centers for Disease Control and Prevention, Division of STD Prevention: www.cdc.gov/std American Sexual Health Association (ASHA): www.ashastd.org  Document Released: 11/23/2005 Document Revised: 09/13/2013 Document Reviewed: 07/05/2013 Exit[employer] Patient Information 2015 [employer]. This information is not intended to replace advice given to you by your health care provider. Make sure you discuss any questions you have with your health care provider.  

## 2014-09-21 NOTE — MAU Provider Note (Signed)
History  38 yo [id number] @ 30.2 wks presents to MAU w/ c/o ctxs and pelvic pressure x 1 day. Takes Percocet for pain. Last one today, but without relief. Reports active fetus. Denies LOF or VB. Denies recent intercourse.   Patient Active Problem List   Diagnosis Date Noted  . Preterm contractions 09/21/2014  . NSVD (normal spontaneous vaginal delivery) 10/05/2013  . History of abnormal cervical Pap smear 10/04/2013  . Elderly multigravida with antepartum condition or complication 02/13/2013  . SAB (spontaneous abortion) x 4 02/13/2013  . Grand multipara 02/13/2013  . Hx of preterm delivery x 2, currently pregnant 02/13/2013  . Hx gestational diabetes x 2 02/13/2013  . Multiple drug allergies 02/13/2013  . Pregnant state, incidental 06/22/2012  . BULIMIA 10/21/2007  . ANXIETY 08/02/2007  . SYMPTOM, PAIN, ABDOMINAL, EPIGASTRIC 08/02/2007  . OBESITY 08/01/2007  . DEPRESSION 08/01/2007    Chief Complaint  Patient presents with  . Contractions   HPI See above OB History   Grav Para Term Preterm Abortions TAB SAB Ect Mult Living   11 7 5 2 3  3   7       Past Medical History  Diagnosis Date  . H/O: depression   . H/O candidiasis   . H/O rubella   . H/O varicella   . Hx: UTI (urinary tract infection)   . GERD (gastroesophageal reflux disease)   . H/O: obesity   . H/O migraine   . ASCUS with positive high risk HPV   . Miscarriage     3/13 and 7/13   . Abnormal Pap smear     Colpo;Last pap 2012, ASCUS w/ negative HPV  . Preterm labor   . Gestational diabetes   . Headache(784.0)   . Anemia     during preg  . Eczema   . Anxiety   . Depression     ok now    Past Surgical History  Procedure Laterality Date  . Cholecystectomy  2008  . Hiatal hernia repair  2012  . Wisdom tooth extraction      Family History  Problem Relation Age of Onset  . Diabetes Maternal Grandmother   . Hypertension Maternal Grandmother   . Anemia Mother     History  Substance Use  Topics  . Smoking status: Never Smoker   . Smokeless tobacco: Never Used  . Alcohol Use: No    Allergies:  Allergies  Allergen Reactions  . Codeine Itching  . Vicodin [Hydrocodone-Acetaminophen] Itching    Prescriptions prior to admission  Medication Sig Dispense Refill  . acetaminophen (TYLENOL) 500 MG tablet Take 1,000 mg by mouth every 6 (six) hours as needed for moderate pain.      . DESONIDE EX Apply 1 application topically daily.      . fluticasone (FLONASE) 50 MCG/ACT nasal spray Place 2 sprays into both nostrils daily.      . halobetasol (ULTRAVATE) 0.05 % cream Apply 1 application topically 2 (two) times daily.      Marland Kitchen. loratadine (CLARITIN) 10 MG tablet Take 10 mg by mouth daily.      Marland Kitchen. oxyCODONE-acetaminophen (PERCOCET/ROXICET) 5-325 MG per tablet Take 1 tablet by mouth every 8 (eight) hours as needed for severe pain.      . Prenatal Vit-Fe Fumarate-FA (PRENATAL MULTIVITAMIN) TABS Take 1 tablet by mouth at bedtime.        ROS Contractions Physical Exam   Results for orders placed during the hospital encounter of 09/21/14 (from the past 24  hour(s))  URINALYSIS, ROUTINE W REFLEX MICROSCOPIC     Status: Abnormal   Collection Time    09/21/14  8:07 PM      Result Value Ref Range   Color, Urine YELLOW  YELLOW   APPearance CLEAR  CLEAR   Specific Gravity, Urine <1.005 (*) 1.005 - 1.030   pH 6.0  5.0 - 8.0   Glucose, UA 100 (*) NEGATIVE mg/dL   Hgb urine dipstick LARGE (*) NEGATIVE   Bilirubin Urine NEGATIVE  NEGATIVE   Ketones, ur 15 (*) NEGATIVE mg/dL   Protein, ur NEGATIVE  NEGATIVE mg/dL   Urobilinogen, UA 0.2  0.0 - 1.0 mg/dL   Nitrite NEGATIVE  NEGATIVE   Leukocytes, UA NEGATIVE  NEGATIVE  URINE MICROSCOPIC-ADD ON     Status: None   Collection Time    09/21/14  8:07 PM      Result Value Ref Range   Squamous Epithelial / LPF RARE  RARE   WBC, UA 0-2  <3 WBC/hpf   RBC / HPF 7-10  <3 RBC/hpf   Bacteria, UA RARE  RARE  WET PREP, GENITAL     Status: Abnormal    Collection Time    09/21/14  9:18 PM      Result Value Ref Range   Yeast Wet Prep HPF POC NONE SEEN  NONE SEEN   Trich, Wet Prep NONE SEEN  NONE SEEN   Clue Cells Wet Prep HPF POC MODERATE (*) NONE SEEN   WBC, Wet Prep HPF POC MODERATE (*) NONE SEEN  FETAL FIBRONECTIN     Status: None   Collection Time    09/21/14  9:18 PM      Result Value Ref Range   Fetal Fibronectin NEGATIVE  NEGATIVE    Blood pressure 124/75, pulse 96, temperature 98.1 F (36.7 C), resp. rate 18, height 5\' 6"  (1.676 m), weight 282 lb 12.8 oz (128.277 kg), last menstrual period 02/15/2014, SpO2 97.00%, unknown if currently breastfeeding.  Physical Exam Gen: NAD  Abd: gravid, soft, non-tender, obese  Neg CVAT bilaterally Speculum: Mod amount of white discharge, no odor, sample taken for fFN and wet prep.  Vagina: No active bleeding Cvx long/thick/closed - no bleeding.  FHRT: Appropriate for GA. One mild, sharp variable noted during tracing. BPP and AFI ordered. Prolonged monitoring after pt returned from u/s revealed Cat 1 tracing; no further variables noted.  Toco: ? ctxs  ED Course  BPP Procardia - reports feeling better from regimen  Assessment: BV BPP 8/8, AFI 14+ cm  Plan: Strict PTL precautions Metronidazole  Urine culture OB f/u Monday   Sherre ScarletWILLIAMS, Kendrick Remigio CNM, MS 09/21/2014 11:51 PM

## 2014-09-21 NOTE — MAU Note (Addendum)
contractions since Thurs night. Menstrual-like cramps in lower back and abd. Have headache. Have sciatica but that is not why i'm here. I called and they told me to come in. Feel some pelvic pressure. Sometimes I feel like my heart is racing and going to have panic attack. I use to have panic attacks. Some epigastric pain. I have had surgery for hiatal hernia

## 2014-09-23 LAB — CULTURE, OB URINE
Colony Count: NO GROWTH
Culture: NO GROWTH

## 2014-09-24 ENCOUNTER — Ambulatory Visit: Payer: Medicaid Other | Attending: Nurse Practitioner | Admitting: Physical Therapy

## 2014-09-24 DIAGNOSIS — M545 Low back pain: Secondary | ICD-10-CM | POA: Diagnosis not present

## 2014-09-24 DIAGNOSIS — Z3A31 31 weeks gestation of pregnancy: Secondary | ICD-10-CM | POA: Insufficient documentation

## 2014-09-24 DIAGNOSIS — O26893 Other specified pregnancy related conditions, third trimester: Secondary | ICD-10-CM | POA: Insufficient documentation

## 2014-09-24 DIAGNOSIS — M62838 Other muscle spasm: Secondary | ICD-10-CM | POA: Insufficient documentation

## 2014-09-24 DIAGNOSIS — Z5189 Encounter for other specified aftercare: Secondary | ICD-10-CM | POA: Diagnosis present

## 2014-10-08 ENCOUNTER — Encounter (HOSPITAL_COMMUNITY): Payer: Self-pay

## 2014-10-17 LAB — OB RESULTS CONSOLE GBS: GBS: NEGATIVE

## 2014-11-06 ENCOUNTER — Inpatient Hospital Stay (HOSPITAL_COMMUNITY)
Admission: AD | Admit: 2014-11-06 | Discharge: 2014-11-07 | Disposition: A | Discharge status: Home / Self Care | Payer: Medicaid Other | Source: Ambulatory Visit | Attending: Obstetrics and Gynecology | Admitting: Obstetrics and Gynecology

## 2014-11-06 ENCOUNTER — Encounter (HOSPITAL_COMMUNITY): Payer: Self-pay | Admitting: *Deleted

## 2014-11-06 DIAGNOSIS — Z3A37 37 weeks gestation of pregnancy: Secondary | ICD-10-CM | POA: Diagnosis not present

## 2014-11-06 DIAGNOSIS — O471 False labor at or after 37 completed weeks of gestation: Secondary | ICD-10-CM | POA: Diagnosis not present

## 2014-11-06 DIAGNOSIS — N760 Acute vaginitis: Secondary | ICD-10-CM

## 2014-11-06 DIAGNOSIS — N898 Other specified noninflammatory disorders of vagina: Secondary | ICD-10-CM | POA: Insufficient documentation

## 2014-11-06 DIAGNOSIS — B9689 Other specified bacterial agents as the cause of diseases classified elsewhere: Secondary | ICD-10-CM

## 2014-11-06 LAB — WET PREP, GENITAL
Trich, Wet Prep: NONE SEEN
Yeast Wet Prep HPF POC: NONE SEEN

## 2014-11-06 NOTE — MAU Provider Note (Signed)
History    Shelly Lane is a 38y.o. [id number] at 37wks who presents, unannounced, with complaint of contractions.  Patient states she as seen in the office today and had her membranes stripped.  Patient reports contractions since this occurred and reports active fetus.  Patient denies LOF and VB.  Patient Active Problem List   Diagnosis Date Noted  . Preterm contractions 09/21/2014  . NSVD (normal spontaneous vaginal delivery) 10/05/2013  . History of abnormal cervical Pap smear 10/04/2013  . Elderly multigravida with antepartum condition or complication 02/13/2013  . SAB (spontaneous abortion) x 4 02/13/2013  . Grand multipara 02/13/2013  . Hx of preterm delivery x 2, currently pregnant 02/13/2013  . Hx gestational diabetes x 2 02/13/2013  . Multiple drug allergies 02/13/2013  . Pregnant state, incidental 06/22/2012  . BULIMIA 10/21/2007  . ANXIETY 08/02/2007  . SYMPTOM, PAIN, ABDOMINAL, EPIGASTRIC 08/02/2007  . OBESITY 08/01/2007  . DEPRESSION 08/01/2007    Chief Complaint  Patient presents with  . Labor Eval   HPI  OB History    Gravida Para Term Preterm AB TAB SAB Ectopic Multiple Living   11 7 5 2 3  3   7       Past Medical History  Diagnosis Date  . H/O: depression   . H/O candidiasis   . H/O rubella   . H/O varicella   . Hx: UTI (urinary tract infection)   . GERD (gastroesophageal reflux disease)   . H/O: obesity   . H/O migraine   . ASCUS with positive high risk HPV   . Miscarriage     3/13 and 7/13   . Abnormal Pap smear     Colpo;Last pap 2012, ASCUS w/ negative HPV  . Preterm labor   . Gestational diabetes   . Headache(784.0)   . Anemia     during preg  . Eczema   . Anxiety   . Depression     ok now    Past Surgical History  Procedure Laterality Date  . Cholecystectomy  2008  . Hiatal hernia repair  2012  . Wisdom tooth extraction      Family History  Problem Relation Age of Onset  . Diabetes Maternal Grandmother   . Hypertension  Maternal Grandmother   . Anemia Mother     History  Substance Use Topics  . Smoking status: Never Smoker   . Smokeless tobacco: Never Used  . Alcohol Use: No    Allergies:  Allergies  Allergen Reactions  . Codeine Itching  . Vicodin [Hydrocodone-Acetaminophen] Itching    Prescriptions prior to admission  Medication Sig Dispense Refill Last Dose  . cyclobenzaprine (FLEXERIL) 10 MG tablet Take 10 mg by mouth 3 (three) times daily as needed for muscle spasms.     Marland Kitchen. acetaminophen (TYLENOL) 500 MG tablet Take 1,000 mg by mouth every 6 (six) hours as needed for moderate pain.   09/21/2014 at Unknown time  . DESONIDE EX Apply 1 application topically daily.   Past Week at Unknown time  . fluticasone (FLONASE) 50 MCG/ACT nasal spray Place 2 sprays into both nostrils daily.   Past Week at Unknown time  . halobetasol (ULTRAVATE) 0.05 % cream Apply 1 application topically 2 (two) times daily.   Past Month at Unknown time  . loratadine (CLARITIN) 10 MG tablet Take 10 mg by mouth daily.   09/21/2014 at Unknown time  . oxyCODONE-acetaminophen (PERCOCET/ROXICET) 5-325 MG per tablet Take 1 tablet by mouth every 8 (eight) hours  as needed for severe pain.   09/21/2014 at 1330  . Prenatal Vit-Fe Fumarate-FA (PRENATAL MULTIVITAMIN) TABS Take 1 tablet by mouth at bedtime.   09/20/2014 at Unknown time    ROS  See HPI Above Physical Exam   Blood pressure 137/74, pulse 120, temperature 98.6 F (37 C), temperature source Oral, resp. rate 18, last menstrual period 02/15/2014, unknown if currently breastfeeding.  Physical Exam  Constitutional: She is oriented to person, place, and time. Vital signs are normal. No distress.  Cardiovascular: Normal rate, regular rhythm and normal heart sounds.   Respiratory: Effort normal and breath sounds normal.  GI: Soft.  Genitourinary: Vaginal discharge found.  Wet prep collected SVE: 3/60/-2  Neurological: She is alert and oriented to person, place, and time.   Skin: Skin is warm and dry.   FHR: 135 bpm, Mod Var, -Decels, +Accels UC; irregular noted, no palpated  ED Course  Assessment: IUP at 37.1wks Cat I FT Contractions Vaginal Discharge  Plan: -Wet prep -Await reactive NST, then can ambulate in halls  Follow Up ([id number]) -Discussed wet prep findings of +Clue Cells=BV -Give 1st dose flagyl, send remainder to pharmacy : 500mg  BID x 7days #13, 0RF -Discussed Therapeutic Rest-Patient accepts -Keep appt as scheduled  -Encouraged to call if any questions or concerns arise prior to next scheduled office visit.  -Labor Precautions -Discharged to home in stable condition  Irven Ingalsbe LYNN CNM, MSN 11/06/2014 11:02 PM

## 2014-11-06 NOTE — MAU Note (Signed)
Pt states that she went to the doctors today and after that she began to have some contractions earlier on in the day. Now the contractions have become more regular. Pt denies leaking of fluid and bleeding.Baby is active.

## 2014-11-06 NOTE — MAU Note (Signed)
Pt to walk for an hour and then be reexamined.

## 2014-11-07 MED ORDER — ZOLPIDEM TARTRATE 5 MG PO TABS
5.0000 mg | ORAL_TABLET | Freq: Once | ORAL | Status: AC
  Administered 2014-11-07: 5 mg via ORAL
  Filled 2014-11-07: qty 1

## 2014-11-07 MED ORDER — METRONIDAZOLE 500 MG PO TABS: 500.0000 mg | ORAL_TABLET | Freq: Two times a day (BID) | ORAL | Status: DC

## 2014-11-07 MED ORDER — METRONIDAZOLE 500 MG PO TABS
500.0000 mg | ORAL_TABLET | Freq: Once | ORAL | Status: AC
  Administered 2014-11-07: 500 mg via ORAL
  Filled 2014-11-07: qty 1

## 2014-11-07 MED ORDER — MORPHINE SULFATE 4 MG/ML IJ SOLN
4.0000 mg | Freq: Once | INTRAMUSCULAR | Status: AC
  Administered 2014-11-07: 4 mg via INTRAMUSCULAR
  Filled 2014-11-07: qty 1

## 2014-11-07 NOTE — Discharge Instructions (Signed)
Bacterial Vaginosis Bacterial vaginosis is a vaginal infection that occurs when the normal balance of bacteria in the vagina is disrupted. It results from an overgrowth of certain bacteria. This is the most common vaginal infection in women of childbearing age. Treatment is important to prevent complications, especially in pregnant women, as it can cause a premature delivery. CAUSES  Bacterial vaginosis is caused by an increase in harmful bacteria that are normally present in smaller amounts in the vagina. Several different kinds of bacteria can cause bacterial vaginosis. However, the reason that the condition develops is not fully understood. RISK FACTORS Certain activities or behaviors can put you at an increased risk of developing bacterial vaginosis, including:  Having a new sex partner or multiple sex partners.  Douching.  Using an intrauterine device (IUD) for contraception. Women do not get bacterial vaginosis from toilet seats, bedding, swimming pools, or contact with objects around them. SIGNS AND SYMPTOMS  Some women with bacterial vaginosis have no signs or symptoms. Common symptoms include:  Grey vaginal discharge.  A fishlike odor with discharge, especially after sexual intercourse.  Itching or burning of the vagina and vulva.  Burning or pain with urination. DIAGNOSIS  Your health care provider will take a medical history and examine the vagina for signs of bacterial vaginosis. A sample of vaginal fluid may be taken. Your health care provider will look at this sample under a microscope to check for bacteria and abnormal cells. A vaginal pH test may also be done.  TREATMENT  Bacterial vaginosis may be treated with antibiotic medicines. These may be given in the form of a pill or a vaginal cream. A second round of antibiotics may be prescribed if the condition comes back after treatment.  HOME CARE INSTRUCTIONS   Only take over-the-counter or prescription medicines as  directed by your health care provider.  If antibiotic medicine was prescribed, take it as directed. Make sure you finish it even if you start to feel better.  Do not have sex until treatment is completed.  Tell all sexual partners that you have a vaginal infection. They should see their health care provider and be treated if they have problems, such as a mild rash or itching.  Practice safe sex by using condoms and only having one sex partner. SEEK MEDICAL CARE IF:   Your symptoms are not improving after 3 days of treatment.  You have increased discharge or pain.  You have a fever. MAKE SURE YOU:   Understand these instructions.  Will watch your condition.  Will get help right away if you are not doing well or get worse. FOR MORE INFORMATION  Centers for Disease Control and Prevention, Division of STD Prevention: www.cdc.gov/std American Sexual Health Association (ASHA): www.ashastd.org  Document Released: 11/23/2005 Document Revised: 09/13/2013 Document Reviewed: 07/05/2013 Exit[employer] Patient Information 2015 [employer]. This information is not intended to replace advice given to you by your health care provider. Make sure you discuss any questions you have with your health care provider.  

## 2014-11-14 ENCOUNTER — Inpatient Hospital Stay (HOSPITAL_COMMUNITY)
Admission: AD | Admit: 2014-11-14 | Discharge: 2014-11-17 | DRG: 775 | Disposition: A | Discharge status: Home / Self Care | Payer: Medicaid Other | Source: Ambulatory Visit | Attending: Obstetrics & Gynecology | Admitting: Obstetrics & Gynecology

## 2014-11-14 DIAGNOSIS — O09523 Supervision of elderly multigravida, third trimester: Secondary | ICD-10-CM

## 2014-11-14 DIAGNOSIS — K219 Gastro-esophageal reflux disease without esophagitis: Secondary | ICD-10-CM | POA: Diagnosis present

## 2014-11-14 DIAGNOSIS — Z3A38 38 weeks gestation of pregnancy: Secondary | ICD-10-CM | POA: Diagnosis present

## 2014-11-14 DIAGNOSIS — O9962 Diseases of the digestive system complicating childbirth: Secondary | ICD-10-CM | POA: Diagnosis present

## 2014-11-14 NOTE — MAU Provider Note (Signed)
Shelly Lane is a 38 y.o. [id number] at 38.0 weeks arrived at MAU c/o ctx q 1-2 minutes since 10pm tonight. States "why are they coming so fast".   She denies vb or lof w/+FM.   Report all deliveries were vaginal.   History     Patient Active Problem List   Diagnosis Date Noted  . Preterm contractions 09/21/2014  . NSVD (normal spontaneous vaginal delivery) 10/05/2013  . History of abnormal cervical Pap smear 10/04/2013  . Elderly multigravida with antepartum condition or complication 02/13/2013  . SAB (spontaneous abortion) x 4 02/13/2013  . Grand multipara 02/13/2013  . Hx of preterm delivery x 2, currently pregnant 02/13/2013  . Hx gestational diabetes x 2 02/13/2013  . Multiple drug allergies 02/13/2013  . Pregnant state, incidental 06/22/2012  . BULIMIA 10/21/2007  . ANXIETY 08/02/2007  . SYMPTOM, PAIN, ABDOMINAL, EPIGASTRIC 08/02/2007  . OBESITY 08/01/2007  . DEPRESSION 08/01/2007    No chief complaint on file.  HPI  OB History    Gravida Para Term Preterm AB TAB SAB Ectopic Multiple Living   11 7 5 2 3  3   7       Past Medical History  Diagnosis Date  . H/O: depression   . H/O candidiasis   . H/O rubella   . H/O varicella   . Hx: UTI (urinary tract infection)   . GERD (gastroesophageal reflux disease)   . H/O: obesity   . H/O migraine   . ASCUS with positive high risk HPV   . Miscarriage     3/13 and 7/13   . Abnormal Pap smear     Colpo;Last pap 2012, ASCUS w/ negative HPV  . Preterm labor   . Gestational diabetes   . Headache(784.0)   . Anemia     during preg  . Eczema   . Anxiety   . Depression     ok now    Past Surgical History  Procedure Laterality Date  . Cholecystectomy  2008  . Hiatal hernia repair  2012  . Wisdom tooth extraction      Family History  Problem Relation Age of Onset  . Diabetes Maternal Grandmother   . Hypertension Maternal Grandmother   . Anemia Mother     History  Substance Use Topics  . Smoking status:  Never Smoker   . Smokeless tobacco: Never Used  . Alcohol Use: No    Allergies:  Allergies  Allergen Reactions  . Codeine Itching  . Vicodin [Hydrocodone-Acetaminophen] Itching    Prescriptions prior to admission  Medication Sig Dispense Refill Last Dose  . acetaminophen (TYLENOL) 500 MG tablet Take 1,000 mg by mouth every 6 (six) hours as needed for moderate pain.   09/21/2014 at Unknown time  . cyclobenzaprine (FLEXERIL) 10 MG tablet Take 10 mg by mouth 3 (three) times daily as needed for muscle spasms.     . DESONIDE EX Apply 1 application topically daily.   Past Week at Unknown time  . fluticasone (FLONASE) 50 MCG/ACT nasal spray Place 2 sprays into both nostrils daily.   Past Week at Unknown time  . halobetasol (ULTRAVATE) 0.05 % cream Apply 1 application topically 2 (two) times daily.   Past Month at Unknown time  . loratadine (CLARITIN) 10 MG tablet Take 10 mg by mouth daily.   09/21/2014 at Unknown time  . metroNIDAZOLE (FLAGYL) 500 MG tablet Take 1 tablet (500 mg total) by mouth 2 (two) times daily. 13 tablet 0   .  oxyCODONE-acetaminophen (PERCOCET/ROXICET) 5-325 MG per tablet Take 1 tablet by mouth every 8 (eight) hours as needed for severe pain.   09/21/2014 at 1330  . Prenatal Vit-Fe Fumarate-FA (PRENATAL MULTIVITAMIN) TABS Take 1 tablet by mouth at bedtime.   09/20/2014 at Unknown time    ROS See HPI above, all other systems are negative  Physical Exam   Last menstrual period 02/15/2014, unknown if currently breastfeeding.  Physical Exam Ext:  WNL ABD: Soft, non tender to palpation, no rebound or guarding SVE: 4-5/80/-3, BBW Vertex by US   ED Course  Assessment: IUP at  38.0weeks Membranes: intact FHR: Category pending CTX:  1-3 minutes   Plan: Admit to labor   Mariesa Grieder, CNM, MSN 11/14/2014. 11:56 PM

## 2014-11-15 ENCOUNTER — Encounter (HOSPITAL_COMMUNITY): Payer: Self-pay | Admitting: *Deleted

## 2014-11-15 DIAGNOSIS — Z3A38 38 weeks gestation of pregnancy: Secondary | ICD-10-CM | POA: Diagnosis present

## 2014-11-15 DIAGNOSIS — O9962 Diseases of the digestive system complicating childbirth: Secondary | ICD-10-CM | POA: Diagnosis present

## 2014-11-15 DIAGNOSIS — K219 Gastro-esophageal reflux disease without esophagitis: Secondary | ICD-10-CM | POA: Diagnosis present

## 2014-11-15 DIAGNOSIS — O09523 Supervision of elderly multigravida, third trimester: Secondary | ICD-10-CM | POA: Diagnosis not present

## 2014-11-15 LAB — TYPE AND SCREEN
ABO/RH(D): O POS
Antibody Screen: NEGATIVE

## 2014-11-15 LAB — CBC
HCT: 30.4 % — ABNORMAL LOW (ref 36.0–46.0)
HCT: 34.8 % — ABNORMAL LOW (ref 36.0–46.0)
Hemoglobin: 10.3 g/dL — ABNORMAL LOW (ref 12.0–15.0)
Hemoglobin: 11.7 g/dL — ABNORMAL LOW (ref 12.0–15.0)
MCH: 29.1 pg (ref 26.0–34.0)
MCH: 29.5 pg (ref 26.0–34.0)
MCHC: 33.6 g/dL (ref 30.0–36.0)
MCHC: 33.9 g/dL (ref 30.0–36.0)
MCV: 86.6 fL (ref 78.0–100.0)
MCV: 87.1 fL (ref 78.0–100.0)
Platelets: 131 10*3/uL — ABNORMAL LOW (ref 150–400)
Platelets: 147 10*3/uL — ABNORMAL LOW (ref 150–400)
RBC: 3.49 MIL/uL — ABNORMAL LOW (ref 3.87–5.11)
RBC: 4.02 MIL/uL (ref 3.87–5.11)
RDW: 14.8 % (ref 11.5–15.5)
RDW: 14.8 % (ref 11.5–15.5)
WBC: 13.3 10*3/uL — ABNORMAL HIGH (ref 4.0–10.5)
WBC: 8.6 10*3/uL (ref 4.0–10.5)

## 2014-11-15 LAB — HIV ANTIBODY (ROUTINE TESTING W REFLEX): HIV 1&2 Ab, 4th Generation: NONREACTIVE

## 2014-11-15 LAB — RPR

## 2014-11-15 MED ORDER — LACTATED RINGERS IV SOLN: 500.0000 mL | INTRAVENOUS | Status: DC | PRN

## 2014-11-15 MED ORDER — OXYCODONE-ACETAMINOPHEN 5-325 MG PO TABS
1.0000 | ORAL_TABLET | ORAL | Status: DC | PRN
  Administered 2014-11-15: 1 via ORAL
  Filled 2014-11-15: qty 1

## 2014-11-15 MED ORDER — ONDANSETRON HCL 4 MG PO TABS: 4.0000 mg | ORAL_TABLET | ORAL | Status: DC | PRN

## 2014-11-15 MED ORDER — DIPHENHYDRAMINE HCL 25 MG PO CAPS
25.0000 mg | ORAL_CAPSULE | Freq: Four times a day (QID) | ORAL | Status: DC | PRN
  Administered 2014-11-16: 25 mg via ORAL
  Filled 2014-11-15: qty 1

## 2014-11-15 MED ORDER — SENNOSIDES-DOCUSATE SODIUM 8.6-50 MG PO TABS
2.0000 | ORAL_TABLET | ORAL | Status: DC
  Administered 2014-11-16 – 2014-11-17 (×2): 2 via ORAL
  Filled 2014-11-15 (×2): qty 2

## 2014-11-15 MED ORDER — OXYTOCIN BOLUS FROM INFUSION: 500.0000 mL | INTRAVENOUS | Status: DC

## 2014-11-15 MED ORDER — SIMETHICONE 80 MG PO CHEW
80.0000 mg | CHEWABLE_TABLET | ORAL | Status: DC | PRN
Start: 2014-11-15 — End: 2014-11-17
  Administered 2014-11-17: 80 mg via ORAL
  Filled 2014-11-15: qty 1

## 2014-11-15 MED ORDER — FERROUS SULFATE 325 (65 FE) MG PO TABS
325.0000 mg | ORAL_TABLET | Freq: Two times a day (BID) | ORAL | Status: DC
  Administered 2014-11-15 – 2014-11-17 (×5): 325 mg via ORAL
  Filled 2014-11-15 (×5): qty 1

## 2014-11-15 MED ORDER — WITCH HAZEL-GLYCERIN EX PADS: 1.0000 "application " | MEDICATED_PAD | CUTANEOUS | Status: DC | PRN

## 2014-11-15 MED ORDER — BENZOCAINE-MENTHOL 20-0.5 % EX AERO
1.0000 "application " | INHALATION_SPRAY | CUTANEOUS | Status: DC | PRN
  Filled 2014-11-15: qty 56

## 2014-11-15 MED ORDER — ONDANSETRON HCL 4 MG/2ML IJ SOLN: 4.0000 mg | Freq: Four times a day (QID) | INTRAMUSCULAR | Status: DC | PRN

## 2014-11-15 MED ORDER — LANOLIN HYDROUS EX OINT: TOPICAL_OINTMENT | CUTANEOUS | Status: DC | PRN

## 2014-11-15 MED ORDER — PRENATAL MULTIVITAMIN CH
1.0000 | ORAL_TABLET | Freq: Every day | ORAL | Status: DC
  Administered 2014-11-15 – 2014-11-16 (×2): 1 via ORAL
  Filled 2014-11-15 (×2): qty 1

## 2014-11-15 MED ORDER — OXYTOCIN 40 UNITS IN LACTATED RINGERS INFUSION - SIMPLE MED
62.5000 mL/h | INTRAVENOUS | Status: DC
  Administered 2014-11-15: 500 mL/h via INTRAVENOUS
  Filled 2014-11-15: qty 1000

## 2014-11-15 MED ORDER — MISOPROSTOL 200 MCG PO TABS
ORAL_TABLET | ORAL | Status: AC
  Administered 2014-11-15: 800 ug via RECTAL
  Filled 2014-11-15: qty 4

## 2014-11-15 MED ORDER — OXYCODONE-ACETAMINOPHEN 5-325 MG PO TABS
2.0000 | ORAL_TABLET | ORAL | Status: DC | PRN
  Administered 2014-11-15 – 2014-11-17 (×8): 2 via ORAL
  Filled 2014-11-15 (×9): qty 2

## 2014-11-15 MED ORDER — CITRIC ACID-SODIUM CITRATE 334-500 MG/5ML PO SOLN: 30.0000 mL | ORAL | Status: DC | PRN

## 2014-11-15 MED ORDER — ZOLPIDEM TARTRATE 5 MG PO TABS: 5.0000 mg | ORAL_TABLET | Freq: Every evening | ORAL | Status: DC | PRN

## 2014-11-15 MED ORDER — SODIUM CHLORIDE 0.9 % IJ SOLN: 3.0000 mL | Freq: Two times a day (BID) | INTRAMUSCULAR | Status: DC

## 2014-11-15 MED ORDER — LIDOCAINE HCL (PF) 1 % IJ SOLN
30.0000 mL | INTRAMUSCULAR | Status: DC | PRN
  Administered 2014-11-15: 30 mL via SUBCUTANEOUS
  Filled 2014-11-15: qty 30

## 2014-11-15 MED ORDER — SODIUM CHLORIDE 0.9 % IJ SOLN: 3.0000 mL | INTRAMUSCULAR | Status: DC | PRN

## 2014-11-15 MED ORDER — LACTATED RINGERS IV SOLN: INTRAVENOUS | Status: DC

## 2014-11-15 MED ORDER — TETANUS-DIPHTH-ACELL PERTUSSIS 5-2.5-18.5 LF-MCG/0.5 IM SUSP: 0.5000 mL | Freq: Once | INTRAMUSCULAR | Status: DC

## 2014-11-15 MED ORDER — ONDANSETRON HCL 4 MG/2ML IJ SOLN: 4.0000 mg | INTRAMUSCULAR | Status: DC | PRN

## 2014-11-15 MED ORDER — SODIUM CHLORIDE 0.9 % IV SOLN: 250.0000 mL | INTRAVENOUS | Status: DC | PRN

## 2014-11-15 MED ORDER — DIBUCAINE 1 % RE OINT
1.0000 "application " | TOPICAL_OINTMENT | RECTAL | Status: DC | PRN
  Filled 2014-11-15: qty 28

## 2014-11-15 MED ORDER — BUTORPHANOL TARTRATE 1 MG/ML IJ SOLN: 1.0000 mg | Freq: Once | INTRAMUSCULAR | Status: DC

## 2014-11-15 MED ORDER — IBUPROFEN 600 MG PO TABS
600.0000 mg | ORAL_TABLET | Freq: Four times a day (QID) | ORAL | Status: DC
  Administered 2014-11-15 – 2014-11-17 (×9): 600 mg via ORAL
  Filled 2014-11-15 (×9): qty 1

## 2014-11-15 NOTE — Progress Notes (Signed)
Subjective: Postpartum Day 0: Vaginal delivery, 1st perineal laceration (repaired), superficial left labial (no repair required). Patient up ad lib, reports no syncope or dizziness. Feeding:  Breast Contraceptive plan:  Undecided at present.  Objective: Vital signs in last 24 hours: Temp:  [98.4 F (36.9 C)-98.5 F (36.9 C)] 98.4 F (36.9 C) (12/10 0900) Pulse Rate:  [100-120] 100 (12/10 0900) Resp:  [18-22] 20 (12/10 0900) BP: (108-138)/(60-81) 108/60 mmHg (12/10 0900) Weight:  [287 lb (130.182 kg)] 287 lb (130.182 kg) (12/10 0110)  Physical Exam:  General: alert Lochia: appropriate Uterine Fundus: firm Perineum: healing well DVT Evaluation: No evidence of DVT seen on physical exam. Negative Homan's sign.    Recent Labs  11/15/14 0015 11/15/14 0600  HGB 11.7* 10.3*  HCT 34.8* 30.4*    Assessment/Plan: Status post vaginal delivery day 0. Stable Continue current care. Patient may desire d/c tomorrow.    Nyra CapesLATHAM, VICKICNM 11/15/2014, 12:54 PM

## 2014-11-15 NOTE — MAU Note (Signed)
Pt in room. Ambulating to BR. Pt refuses to go back on FHM at this time.

## 2014-11-15 NOTE — H&P (Signed)
Shelly Lane is a 38 y.o. female, G11 P4247 at 38.3 weeks ctx since 10pm.  Pt reports only being allergic to vicodin and denies allergy to codeine and hydrocodone.  Says she has take percocet on many occasions   Patient Active Problem List   Diagnosis Date Noted  . Preterm contractions 09/21/2014  . NSVD (normal spontaneous vaginal delivery) 10/05/2013  . History of abnormal cervical Pap smear 10/04/2013  . Elderly multigravida with antepartum condition or complication 02/13/2013  . SAB (spontaneous abortion) x 4 02/13/2013  . Grand multipara 02/13/2013  . Hx of preterm delivery x 2, currently pregnant 02/13/2013  . Hx gestational diabetes x 2 02/13/2013  . Multiple drug allergies 02/13/2013  . Pregnant state, incidental 06/22/2012  . BULIMIA 10/21/2007  . ANXIETY 08/02/2007  . SYMPTOM, PAIN, ABDOMINAL, EPIGASTRIC 08/02/2007  . OBESITY 08/01/2007  . DEPRESSION 08/01/2007    Pregnancy Course: Patient entered care at 9.1 weeks.   EDC of 11/28/14 was established by US.      US evaluations:   6.0 weeks - Dating: crown rump 3mm, S=D,  9.0 weeks - FU: FHR 166, anterverted uterus, normal fluid, cervix closed   19.0 weeks - Anatomy: EFW 11oz - 78.1%,  Cervical length 4.97, vertex, anterior placenta, cervix closed, normal fluid, no abd seen, female, unable to heart structured d/t body size    22.2 weeks - FU: S=D, FHW 1lb 3oz - 68.5%, cervical length 5.00, FHR 140, breech, cardiac anatomy wnl,   27.3 weeks - FU: AUA 36.6, EFW 2lb 5oz - 48%, AFI 15.1, FHR 138, vertex, anterior placenta  31.5 weeks - FU: EFW 4lb 9oz - 52%, AFI 16.0, vertex, anterior placenta  33.0 weeks - FU: AFI 17.6, FHR 143,  Vertex, BPP 8/8  35.3 weeks - FU: AFI 9.86, FHR 145, BPP 8/8  36.1 weeks - FU AFI  9.63, FHR 152, BPP 8/8  37.1 weeks - FU: EFW 7lb 9oz - 82.5%, AFI 12.07, FHR 151  38.1 weeks - FU: AFT 16.82, FHR 146, BPP 8/8, vertex, anterior placenta,    Significant prenatal events:    mental health  counselor referral, see [facility] note 09/27/14  Last evaluation:   38.1 weeks   VE: note done  Reason for admission:  labor  Pt States:   Contractions Frequency: 2 minutes         Contraction severity: strong         Fetal activity: +FM  OB History    Gravida Para Term Preterm AB TAB SAB Ectopic Multiple Living   11 7 5 2 3  3   7      Past Medical History  Diagnosis Date  . H/O: depression   . H/O candidiasis   . H/O rubella   . H/O varicella   . Hx: UTI (urinary tract infection)   . GERD (gastroesophageal reflux disease)   . H/O: obesity   . H/O migraine   . ASCUS with positive high risk HPV   . Miscarriage     3/13 and 7/13   . Abnormal Pap smear     Colpo;Last pap 2012, ASCUS w/ negative HPV  . Preterm labor   . Gestational diabetes   . Headache(784.0)   . Anemia     during preg  . Eczema   . Anxiety   . Depression     ok now   Past Surgical History  Procedure Laterality Date  . Cholecystectomy  2008  . Hiatal hernia repair  2012  .  Wisdom tooth extraction     Family History: family history includes Anemia in her mother; Diabetes in her maternal grandmother; Hypertension in her maternal grandmother. Social History:  reports that she has never smoked. She has never used smokeless tobacco. She reports that she does not drink alcohol or use illicit drugs.   Prenatal Transfer Tool  Maternal Diabetes: No Genetic Screening: Normal Maternal Ultrasounds/Referrals: Normal Fetal Ultrasounds or other Referrals:  None Maternal Substance Abuse:  No Significant Maternal Medications:  Meds include: Other: cyclobenzaprine, desonide, loratadine, solpidem Significant Maternal Lab Results: None   ROS:  See HPI above, all other systems are negative  Allergies  Allergen Reactions  . Codeine Itching  . Vicodin [Hydrocodone-Acetaminophen] Itching    Dilation: 4.5 Effacement (%): 80 Station: -2 Exam by:: Shantia Sanford, CNM Last menstrual period 02/15/2014, unknown if  currently breastfeeding.  Maternal Exam:  Uterine Assessment: Contraction frequency is rare.  Abdomen: Gravid, non tender. Fundal height is aga.  Normal external genitalia, vulva, cervix, uterus and adnexa.  No lesions noted on exam.  Pelvis adequate for delivery.  Fetal presentation: Vertex by US  Fetal Exam:  Monitor Surveillance : Intermitting after category 1 NST Mode: Ultrasound.  NICHD: Category 1 CTXs: Q 2-373minutes EFW   8 lbs  Physical Exam: Nursing note and vitals reviewed General: alert and cooperative She appears well nourished Psychiatric: Normal mood and affect. Her behavior is normal Head: Normocephalic Eyes: Pupils are equal, round, and reactive to light Neck: Normal range of motion Cardiovascular: RRR without murmur  Respiratory: CTAB. Effort normal  Abd: soft, non-tender, +BS, no rebound, no guarding  Genitourinary: Vagina normal  Neurological: A&Ox3 Skin: Warm and dry  Musculoskeletal: Normal range of motion  Homan's sign negative bilaterally No evidence of DVTs.  Edema: Minimal bilaterally non-pitting edema DTR: 2+ Clonus: None   Prenatal labs: ABO, Rh:  O positive Antibody:   negative Rubella:   immune RPR:   NR HBsAg:   negative HIV:   NR GBS:  negative Sickle cell/Hgb electrophoresis:  WNL Pap:  2012 ASCUC w/neg HPV GC:   negative Chlamydia: negative Genetic screenings:  wnl Glucola:  wnl  Assessment:  IUP at 38.3 weeks NICHD: Category Membranes: intact GBS negative  Plan:  Admit to L&D for expectant/active management of labor. Possible augmentation options reviewed including foley bulb, AROM and/or pitocin.   IV pain medication per orders PRN Epidural per patient request Foley cath after patient is comfortable with epidural Anticipate SVD  Labor mgmt as ordered  Okay to ambulate around unit with wireless monitors   May auscultate FHR intermittently,  if expectant management     q 30 min in active labor     q 15 min in  transition     q 5 min with pushing.     May ambulate without monitoring.     If no active labor, may do NST q 2 hours.   Attending MD available at all times.    Broughton Eppinger, CNM, MSN 11/15/2014, 12:18 AM       All information will be confirmed upon admisson

## 2014-11-15 NOTE — Plan of Care (Signed)
Problem: Phase I Progression Outcomes Goal: Pain controlled with appropriate interventions Outcome: Completed/Met Date Met:  11/15/14 Goal: Voiding adequately Outcome: Completed/Met Date Met:  11/15/14 Goal: OOB as tolerated unless otherwise ordered Outcome: Completed/Met Date Met:  11/15/14 Goal: VS, stable, temp < 100.4 degrees F Outcome: Completed/Met Date Met:  11/15/14 Goal: Initial discharge plan identified Outcome: Completed/Met Date Met:  11/15/14

## 2014-11-15 NOTE — MAU Note (Signed)
Report given to Maceohristy, Charity fundraiserN in birthing suites. Strip reviewed, pt may transfer with RN to room 163.

## 2014-11-15 NOTE — Progress Notes (Signed)
Patient denied allergy to codeine, states only allergic to Vicodin.  Shelly Lane, CNM present and confirmed with patient.

## 2014-11-15 NOTE — Progress Notes (Signed)
CSW acknowledges consult for maternal history of anxiety and depression.  CSW attempted to meet with the MOB, but she was observed to be sleeping.  CSW to make second attempt on 12/11. 

## 2014-11-15 NOTE — Lactation Note (Signed)
This note was copied from the chart of Shelly Lane. Lactation Consultation Note  Initial visit made.  Breastfeeding consultation services and support information given and reviewed with mom.  She states this is her 8 th baby and she nursed the babies that latched but not all did.  She states newborn nursed about 10 minutes after birth and latched well.  Instructed on feeding cues and encouraged to feed with any cue.  Instructed to call for concerns/assist prn. Patient Name: Shelly Lane Today's Date: 11/15/2014     Maternal Data    Feeding Feeding Type: Breast Fed  LATCH Score/Interventions Latch: Repeated attempts needed to sustain latch, nipple held in mouth throughout feeding, stimulation needed to elicit sucking reflex. Intervention(s): Adjust position  Audible Swallowing: A few with stimulation Intervention(s): Skin to skin;Hand expression  Type of Nipple: Everted at rest and after stimulation  Comfort (Breast/Nipple): Soft / non-tender     Hold (Positioning): No assistance needed to correctly position infant at breast.  LATCH Score: 8  Lactation Tools Discussed/Used     Consult Status      Huston FoleyMOULDEN, [name] 11/15/2014, 12:01 PM

## 2014-11-16 NOTE — Progress Notes (Signed)
Clinical Social Work Department BRIEF PSYCHOSOCIAL ASSESSMENT 11/16/2014  Patient:  Shelly Lane, Shelly Lane     Account Number:  [id number]     Admit date:  11/14/2014  Clinical Social Worker:  Lucita Ferrara, CLINICAL SOCIAL WORKER  Date/Time:  11/16/2014 09:00 AM  Referred by:  RN  Date Referred:  11/15/2014 Referred for  [place] type:  Patient Other interview type:   MOB also provided consent for her friend to be present for the visit.    PSYCHOSOCIAL DATA Living Status:  FAMILY Primary support name:  Husband Degree of support available:   MOB lives with the FOB and her children.  She also reported having strong support system with family and friends.    CURRENT CONCERNS Current Concerns  Behavioral Health Issues   SOCIAL WORK ASSESSMENT / PLAN CSW met with the MOB due to receiving consult for history of depression and anxiety.  CSW noted that diagnosis is dated back to 2008.  CSW did not screen out referral since MOB reported panic attacks and anxiety during her pregnancy.  MOB originally presented as closed and guarded when CSW entered the room, but as CSW shared reason for consult, she became more open and receptive.  MOB displayed appropriate range in affect and presented in a pleasant mood, and did not present with any acute mental health symptoms.  She was observed to be attending to and bonding with the baby throughout the visit.   MOB presented with insight and awareness about her mental health.  She verbalized awareness of increased risk for developing postpartum depression since she has a history of anxiety/depression, a history of postpartum depression, and experienced increased in anxiety during her pregnancy.  MOB denied concerns despite her increased risk since she believes she has support and awareness of her feelings.  MOB confirmed that she experienced panic attacks during her pregnancy, and shared that her adolescent son has been engaging in  defiant behaviors, including running away from home and disappearing for days at a time.  She shared that he has been minimally responsive to interventions that are aimed to assist him to remain at school and in school.  She stated that this stress triggered her panic attacks, and acknowledged referral for [facility] during the pregnancy.  She stated that she attended one appointment, found it beneficial, but then did not continue since she had other children to care for and felt physically ill during the pregnancy.  MOB expressed interest in returning to therapy in the postpartum period, but stated that she could not remember the name of the agency or the phone number.  CSW provided the MOB with this information and assisted her to place the phone number in her phone.    MOB denied additional questions and concerns.   No barriers to discharge.   Assessment/plan status:  Information/Referral to [employer] Other assessment/ plan:   Postpartum depression education   Information/referral to community resources:   CSW provided MOB with contact information for [facility].    PATIENT'S/FAMILY'S RESPONSE TO PLAN OF CARE: MOB expressed appreciation for the visit and acknowledged ongoing CSW availability as needed.

## 2014-11-16 NOTE — Progress Notes (Signed)
Shelly MinerLinda E Lauderback   Subjective: Post Partum Day 1 Vaginal delivery, 1 degree laceration Patient up ad lib, denies syncope or dizziness. Reports consuming regular diet without issues and denies N/V No issues with urination and reports bleeding is appropriate  Feeding:  breast Contraceptive plan:   unsure  Objective: Temp:  [97.7 F (36.5 C)-98.5 F (36.9 C)] 97.7 F (36.5 C) (12/11 0600) Pulse Rate:  [85-96] 85 (12/11 0600) Resp:  [18] 18 (12/11 0600) BP: (106)/(58-70) 106/58 mmHg (12/11 0600)  Physical Exam:  General: alert and cooperative Ext: WNL, no edema. No evidence of DVT seen on physical exam. Breast: Soft filling Lungs: CTAB Heart RRR without murmur  Abdomen:  Soft, fundus firm, lochia scant, + bowel sounds, non distended, non tender Lochia: appropriate Uterine Fundus: firm Laceration: healing well    Recent Labs  11/15/14 0015 11/15/14 0600  HGB 11.7* 10.3*  HCT 34.8* 30.4*    Assessment S/P Vaginal Delivery-Day 1 Stable  Normal [facility] Breastfeeding   Plan: Continue current care Plan for discharge tomorrow and Breastfeeding Lactation support   Reilyn Nelson, CNM, MSN 11/16/2014, 11:17 AM

## 2014-11-17 MED ORDER — IBUPROFEN 600 MG PO TABS: 600.0000 mg | ORAL_TABLET | Freq: Four times a day (QID) | ORAL | Status: DC

## 2014-11-17 MED ORDER — FLUTICASONE PROPIONATE 50 MCG/ACT NA SUSP
2.0000 | Freq: Every day | NASAL | Status: DC
Start: 2014-11-17 — End: 2014-11-17
  Administered 2014-11-17 (×2): 2 via NASAL
  Filled 2014-11-17: qty 16

## 2014-11-17 MED ORDER — LORATADINE 10 MG PO TABS
10.0000 mg | ORAL_TABLET | Freq: Every day | ORAL | Status: DC
  Administered 2014-11-17 (×2): 10 mg via ORAL
  Filled 2014-11-17 (×2): qty 1

## 2014-11-17 MED ORDER — DOCUSATE SODIUM 100 MG PO CAPS
100.0000 mg | ORAL_CAPSULE | Freq: Two times a day (BID) | ORAL | Status: DC
  Administered 2014-11-17 (×2): 100 mg via ORAL
  Filled 2014-11-17 (×2): qty 1

## 2014-11-17 MED ORDER — OXYCODONE-ACETAMINOPHEN 5-325 MG PO TABS: 1.0000 | ORAL_TABLET | ORAL | Status: DC | PRN

## 2014-11-17 NOTE — Lactation Note (Signed)
This note was copied from the chart of Shelly Westley GamblesLinda Morre. Lactation Consultation Note  Patient Name: Shelly Lane ZOXWR'UToday's Date: 11/17/2014 Reason for consult: Follow-up assessment  Assisted mom with latch and depth , baby latched well with depth , and swallows noted , increased with breast compressions. Sore nipple and engorgement prevention and tx reviewed , referring to the Baby and me Booklet. Mother informed of post-discharge support and given phone number to the lactation department, including services for phone call assistance; out-patient appointments; and breastfeeding support group. List of other breastfeeding resources in the community given in the handout. Encouraged mother to call for problems or concerns related to breastfeeding.   Maternal Data    Feeding Feeding Type: Breast Fed Length of feed: 8 min (latched , sluggish patrtern with swallows )  LATCH Score/Interventions Latch: Grasps breast easily, tongue down, lips flanged, rhythmical sucking. Intervention(s): Skin to skin;Teach feeding cues;Waking techniques Intervention(s): Adjust position;Assist with latch;Breast massage;Breast compression  Audible Swallowing: A few with stimulation Intervention(s): Skin to skin (not really interested in eating)  Type of Nipple: Everted at rest and after stimulation  Comfort (Breast/Nipple): Soft / non-tender     Hold (Positioning): Assistance needed to correctly position infant at breast and maintain latch. Intervention(s): Breastfeeding basics reviewed;Support Pillows;Position options;Skin to skin  LATCH Score: 8  Lactation Tools Discussed/Used Pump Review: Milk Storage   Consult Status Consult Status: Complete    Kathrin Greathouseorio, Shelly Lane Ann 11/17/2014, 9:40 AM

## 2014-11-17 NOTE — Discharge Instructions (Signed)
Postpartum Depression and Baby Blues °The postpartum period begins right after the birth of a baby. During this time, there is often a great amount of joy and excitement. It is also a time of many changes in the life of the parents. Regardless of how many times a mother gives birth, each child brings new challenges and dynamics to the family. It is not unusual to have feelings of excitement along with confusing shifts in moods, emotions, and thoughts. All mothers are at risk of developing postpartum depression or the "baby blues." These mood changes can occur right after giving birth, or they may occur many months after giving birth. The baby blues or postpartum depression can be mild or severe. Additionally, postpartum depression can go away rather quickly, or it can be a long-term condition.  °CAUSES °Raised hormone levels and the rapid drop in those levels are thought to be a main cause of postpartum depression and the baby blues. A number of hormones change during and after pregnancy. Estrogen and progesterone usually decrease right after the delivery of your baby. The levels of thyroid hormone and various cortisol steroids also rapidly drop. Other factors that play a role in these mood changes include major life events and genetics.  °RISK FACTORS °If you have any of the following risks for the baby blues or postpartum depression, know what symptoms to watch out for during the postpartum period. Risk factors that may increase the likelihood of getting the baby blues or postpartum depression include: °· Having a personal or family history of depression.   °· Having depression while being pregnant.   °· Having premenstrual mood issues or mood issues related to oral contraceptives. °· Having a lot of life stress.   °· Having marital conflict.   °· Lacking a social support network.   °· Having a baby with special needs.   °· Having health problems, such as diabetes.   °SIGNS AND SYMPTOMS °Symptoms of baby blues  include: °· Brief changes in mood, such as going from extreme happiness to sadness. °· Decreased concentration.   °· Difficulty sleeping.   °· Crying spells, tearfulness.   °· Irritability.   °· Anxiety.   °Symptoms of postpartum depression typically begin within the first month after giving birth. These symptoms include: °· Difficulty sleeping or excessive sleepiness.   °· Marked weight loss.   °· Agitation.   °· Feelings of worthlessness.   °· Lack of interest in activity or food.   °Postpartum psychosis is a very serious condition and can be dangerous. Fortunately, it is rare. Displaying any of the following symptoms is cause for immediate medical attention. Symptoms of postpartum psychosis include:  °· Hallucinations and delusions.   °· Bizarre or disorganized behavior.   °· Confusion or disorientation.   °DIAGNOSIS  °A diagnosis is made by an evaluation of your symptoms. There are no medical or lab tests that lead to a diagnosis, but there are various questionnaires that a health care provider may use to identify those with the baby blues, postpartum depression, or psychosis. Often, a screening tool called the Edinburgh Postnatal Depression Scale is used to diagnose depression in the postpartum period.  °TREATMENT °The baby blues usually goes away on its own in 1-2 weeks. Social support is often all that is needed. You will be encouraged to get adequate sleep and rest. Occasionally, you may be given medicines to help you sleep.  °Postpartum depression requires treatment because it can last several months or longer if it is not treated. Treatment may include individual or group therapy, medicine, or both to address any social, physiological, and psychological   factors that may play a role in the depression. Regular exercise, a healthy diet, rest, and social support may also be strongly recommended.  Postpartum psychosis is more serious and needs treatment right away. Hospitalization is often needed. HOME CARE  INSTRUCTIONS  Get as much rest as you can. Nap when the baby sleeps.   Exercise regularly. Some women find yoga and walking to be beneficial.   Eat a balanced and nourishing diet.   Do little things that you enjoy. Have a cup of tea, take a bubble bath, read your favorite magazine, or listen to your favorite music.  Avoid alcohol.   Ask for help with household chores, cooking, grocery shopping, or running errands as needed. Do not try to do everything.   Talk to people close to you about how you are feeling. Get support from your partner, family members, friends, or other new moms.  Try to stay positive in how you think. Think about the things you are grateful for.   Do not spend a lot of time alone.   Only take over-the-counter or prescription medicine as directed by your health care provider.  Keep all your postpartum appointments.   Let your health care provider know if you have any concerns.  SEEK MEDICAL CARE IF: You are having a reaction to or problems with your medicine. SEEK IMMEDIATE MEDICAL CARE IF:  You have suicidal feelings.   You think you may harm the baby or someone else. MAKE SURE YOU:  Understand these instructions.  Will watch your condition.  Will get help right away if you are not doing well or get worse. Document Released: 08/27/2004 Document Revised: 11/28/2013 Document Reviewed: 09/04/2013 [facility] Patient Information 2015 [place], [place]. This information is not intended to replace advice given to you by your health care provider. Make sure you discuss any questions you have with your health care provider. Etonogestrel implant What is this medicine? ETONOGESTREL (et oh noe JES trel) is a contraceptive (birth control) device. It is used to prevent pregnancy. It can be used for up to 3 years. This medicine may be used for other purposes; ask your health care provider or pharmacist if you have questions. COMMON BRAND NAME(S): Implanon,  Nexplanon What should I tell my health care provider before I take this medicine? They need to know if you have any of these conditions: -abnormal vaginal bleeding -blood vessel disease or blood clots -cancer of the breast, cervix, or liver -depression -diabetes -gallbladder disease -headaches -heart disease or recent heart attack -high blood pressure -high cholesterol -kidney disease -liver disease -renal disease -seizures -tobacco smoker -an unusual or allergic reaction to etonogestrel, other hormones, anesthetics or antiseptics, medicines, foods, dyes, or preservatives -pregnant or trying to get pregnant -breast-feeding How should I use this medicine? This device is inserted just under the skin on the inner side of your upper arm by a health care professional. Talk to your pediatrician regarding the use of this medicine in children. Special care may be needed. Overdosage: If you think you've taken too much of this medicine contact a poison control center or emergency room at once. Overdosage: If you think you have taken too much of this medicine contact a poison control center or emergency room at once. NOTE: This medicine is only for you. Do not share this medicine with others. What if I miss a dose? This does not apply. What may interact with this medicine? Do not take this medicine with any of the following medications: -amprenavir -bosentan -  fosamprenavir °This medicine may also interact with the following medications: °-barbiturate medicines for inducing sleep or treating seizures °-certain medicines for fungal infections like ketoconazole and itraconazole °-griseofulvin °-medicines to treat seizures like carbamazepine, felbamate, oxcarbazepine, phenytoin, topiramate °-modafinil °-phenylbutazone °-rifampin °-some medicines to treat HIV infection like atazanavir, indinavir, lopinavir, nelfinavir, tipranavir, ritonavir °-St. John's wort °This list may not describe all possible  interactions. Give your health care provider a list of all the medicines, herbs, non-prescription drugs, or dietary supplements you use. Also tell them if you smoke, drink alcohol, or use illegal drugs. Some items may interact with your medicine. °What should I watch for while using this medicine? °This product does not protect you against HIV infection (AIDS) or other sexually transmitted diseases. °You should be able to feel the implant by pressing your fingertips over the skin where it was inserted. Tell your doctor if you cannot feel the implant. °What side effects may I notice from receiving this medicine? °Side effects that you should report to your doctor or health care professional as soon as possible: °-allergic reactions like skin rash, itching or hives, swelling of the face, lips, or tongue °-breast lumps °-changes in vision °-confusion, trouble speaking or understanding °-dark urine °-depressed mood °-general ill feeling or flu-like symptoms °-light-colored stools °-loss of appetite, nausea °-right upper belly pain °-severe headaches °-severe pain, swelling, or tenderness in the abdomen °-shortness of breath, chest pain, swelling in a leg °-signs of pregnancy °-sudden numbness or weakness of the face, arm or leg °-trouble walking, dizziness, loss of balance or coordination °-unusual vaginal bleeding, discharge °-unusually weak or tired °-yellowing of the eyes or skin °Side effects that usually do not require medical attention (Report these to your doctor or health care professional if they continue or are bothersome.): °-acne °-breast pain °-changes in weight °-cough °-fever or chills °-headache °-irregular menstrual bleeding °-itching, burning, and vaginal discharge °-pain or difficulty passing urine °-sore throat °This list may not describe all possible side effects. Call your doctor for medical advice about side effects. You may report side effects to FDA at 1-800-FDA-1088. °Where should I keep my  medicine? °This drug is given in a hospital or clinic and will not be stored at home. °NOTE: This sheet is a summary. It may not cover all possible information. If you have questions about this medicine, talk to your doctor, pharmacist, or health care provider. °© 2015, [employer]. (2012-05-30 15:37:45) ° °

## 2014-11-17 NOTE — Discharge Summary (Signed)
Vaginal Delivery Discharge Summary  Shelly Lane  DOB:    1976/06/21 MRN:    [id number] CSN:    [id number]  Date of admission:                  Nov 14, 2014  Date of discharge:                   Nov 17, 2014  Procedures this admission:   SVD with 1st degree perineal  Date of Delivery: Nov 15, 2014  Newborn Data:  Live born female  Birth Weight: 8 lb 6.2 oz (3805 g) APGAR: 9, 9  Home with mother. Name: Shelly Lane Circumcision Plan: N/A  History of Present Illness:  [name] is a 38 y.o. female, [id number], who presents at 5181w1d weeks gestation. [facility]. She was admitted onset of labor. Her pregnancy has been complicated by:  Patient Active Problem List   Diagnosis Date Noted  . Vaginal delivery 11/15/2014  . Preterm contractions 09/21/2014  . NSVD (normal spontaneous vaginal delivery) 10/05/2013  . History of abnormal cervical Pap smear 10/04/2013  . Elderly multigravida with antepartum condition or complication 02/13/2013  . SAB (spontaneous abortion) x 4 02/13/2013  . Grand multipara 02/13/2013  . Hx of preterm delivery x 2, currently pregnant 02/13/2013  . Hx gestational diabetes x 2 02/13/2013  . Multiple drug allergies 02/13/2013  . Pregnant state, incidental 06/22/2012  . BULIMIA 10/21/2007  . ANXIETY 08/02/2007  . SYMPTOM, PAIN, ABDOMINAL, EPIGASTRIC 08/02/2007  . OBESITY 08/01/2007  . DEPRESSION 08/01/2007     Hospital Course:  Admitted after presenting for contractions.  Patient was sent home and returned C/C/ with BBOW. Negative GBS. Progressed without intervention. Utilized Nothing for pain management.  Delivery was performed by V. Standard, CNM without complication. Patient and baby tolerated the procedure without difficulty, with  First degree laceration noted. Infant status was stable and remained in room with mother.  Mother and  infant then had an uncomplicated postpartum course, with breas feeding going well. Mom's physical exam was WNL, and she was discharged home in stable condition. Contraception plan was nexplanon.  She received adequate benefit from po pain medications.   Feeding:  breast  Contraception:  Nexplanon  Discharge hemoglobin:  HEMOGLOBIN  Date Value Ref Range Status  11/15/2014 10.3* 12.0 - 15.0 g/dL Final   HCT  Date Value Ref Range Status  11/15/2014 30.4* 36.0 - 46.0 % Final    Discharge Physical Exam:   General: alert, cooperative and no distress Lochia: appropriate Uterine Fundus: firm, U/-1 Incision: N/A DVT Evaluation: No evidence of DVT seen on physical exam. No significant calf/ankle edema.  Intrapartum Procedures: spontaneous vaginal delivery Postpartum Procedures: none Complications-Operative and Postpartum: first degree perineal laceration  Discharge Diagnoses: Term Pregnancy-delivered  Discharge Information:  Activity:           pelvic rest Diet:                routine Medications: PNV, Ibuprofen, Iron and Percocet Condition:      stable Instructions:  Pain Management, Peri-Care, Breastfeeding, Who and When to call for postpartum complications.  Discharge to: home  Follow-up Information    [facility]. Schedule an appointment as soon as possible for a visit in 5 weeks.   Specialty:  Obstetrics and Gynecology   Why:  Please call  if you have any questions or concerns prior to your next visit.   Contact information:   [address] [place] [id number] [phone]       Marlene BastMLY, Johathan Province LYNN MSN, CNM 11/17/2014 8:25 AM

## 2015-05-16 ENCOUNTER — Other Ambulatory Visit (HOSPITAL_COMMUNITY): Payer: Self-pay | Admitting: Nurse Practitioner

## 2015-05-16 DIAGNOSIS — R202 Paresthesia of skin: Secondary | ICD-10-CM

## 2015-05-16 DIAGNOSIS — M545 Low back pain, unspecified: Secondary | ICD-10-CM

## 2015-05-23 ENCOUNTER — Ambulatory Visit (HOSPITAL_COMMUNITY)
Admission: RE | Admit: 2015-05-23 | Discharge: 2015-05-23 | Disposition: A | Discharge status: Home / Self Care | Payer: Medicaid Other | Source: Ambulatory Visit | Attending: Nurse Practitioner | Admitting: Nurse Practitioner

## 2015-05-23 ENCOUNTER — Ambulatory Visit (HOSPITAL_COMMUNITY): Payer: Medicaid Other

## 2015-05-23 DIAGNOSIS — M62830 Muscle spasm of back: Secondary | ICD-10-CM | POA: Diagnosis not present

## 2015-05-23 DIAGNOSIS — M545 Low back pain, unspecified: Secondary | ICD-10-CM

## 2015-05-23 DIAGNOSIS — M5137 Other intervertebral disc degeneration, lumbosacral region: Secondary | ICD-10-CM | POA: Diagnosis not present

## 2015-05-23 DIAGNOSIS — R202 Paresthesia of skin: Secondary | ICD-10-CM | POA: Diagnosis not present

## 2015-05-23 DIAGNOSIS — M5126 Other intervertebral disc displacement, lumbar region: Secondary | ICD-10-CM | POA: Insufficient documentation

## 2015-06-05 ENCOUNTER — Ambulatory Visit: Payer: Medicaid Other | Admitting: Rehabilitation

## 2015-06-06 ENCOUNTER — Ambulatory Visit: Payer: Medicaid Other | Attending: Anesthesiology | Admitting: Physical Therapy

## 2015-06-06 DIAGNOSIS — M62830 Muscle spasm of back: Secondary | ICD-10-CM | POA: Insufficient documentation

## 2015-06-06 DIAGNOSIS — M25531 Pain in right wrist: Secondary | ICD-10-CM | POA: Insufficient documentation

## 2015-06-06 DIAGNOSIS — M79644 Pain in right finger(s): Secondary | ICD-10-CM

## 2015-06-06 DIAGNOSIS — M2560 Stiffness of unspecified joint, not elsewhere classified: Secondary | ICD-10-CM | POA: Insufficient documentation

## 2015-06-06 DIAGNOSIS — R293 Abnormal posture: Secondary | ICD-10-CM | POA: Diagnosis not present

## 2015-06-06 DIAGNOSIS — M545 Low back pain, unspecified: Secondary | ICD-10-CM

## 2015-06-06 DIAGNOSIS — M79645 Pain in left finger(s): Secondary | ICD-10-CM | POA: Insufficient documentation

## 2015-06-06 DIAGNOSIS — M25532 Pain in left wrist: Secondary | ICD-10-CM | POA: Insufficient documentation

## 2015-06-06 DIAGNOSIS — M5386 Other specified dorsopathies, lumbar region: Secondary | ICD-10-CM

## 2015-06-06 NOTE — Therapy (Signed)
[facility] [address] [place], [place], [id number] Phone: [phone]   Fax:  [phone]  Physical Therapy Evaluation  Patient Details  Name: Shelly Lane MRN: [id number] Date of Birth: 09-08-1976 Referring Provider:  Tonye RoyaltyGyarteng-Dakwa, Kwadwo,*  Encounter Date: 06/06/2015      PT End of Session - 06/06/15 1559    Visit Number 1   Number of Visits 1   Date for PT Re-Evaluation 06/06/15   PT Start Time 1500   PT Stop Time 1550   PT Time Calculation (min) 50 min   Activity Tolerance Patient tolerated treatment well   Behavior During Therapy [facility] for tasks assessed/performed      Past Medical History  Diagnosis Date  . H/O: depression   . H/O candidiasis   . H/O rubella   . H/O varicella   . Hx: UTI (urinary tract infection)   . GERD (gastroesophageal reflux disease)   . H/O: obesity   . H/O migraine   . ASCUS with positive high risk HPV   . Miscarriage     3/13 and 7/13   . Abnormal Pap smear     Colpo;Last pap 2012, ASCUS w/ negative HPV  . Preterm labor   . Gestational diabetes   . Headache(784.0)   . Anemia     during preg  . Eczema   . Anxiety   . Depression     ok now    Past Surgical History  Procedure Laterality Date  . Cholecystectomy  2008  . Hiatal hernia repair  2012  . Wisdom tooth extraction      There were no vitals filed for this visit.  Visit Diagnosis:  Bilateral low back pain without sciatica - Plan: PT plan of care cert/re-cert  Muscle spasm of back - Plan: PT plan of care cert/re-cert  Decreased range of motion of lumbar spine - Plan: PT plan of care cert/re-cert  Bilateral wrist pain - Plan: PT plan of care cert/re-cert  Bilateral thumb pain - Plan: PT plan of care cert/re-cert  Abnormal posture - Plan: PT plan of care cert/re-cert      Subjective Assessment - 06/06/15 1509    Subjective pt is a 39 y.o F with CC of low back pain and bil wrist pain.  The wrist started  about 5 months ago, and the back started 2 years after the her last child, all of which started insidoiusly. she reports the symptoms getting worse since onset.    Limitations Lifting;Standing;House hold activities;Walking   How long can you sit comfortably? 10 min   How long can you stand comfortably? 10 min   How long can you walk comfortably? 10 min   Diagnostic tests MRI 05/24/2015 Degenerative disc disease at L4-5 and L5-S1 with shallow disc   Patient Stated Goals to be pain free   Currently in Pain? Yes   Pain Score 5    Pain Location Back   Pain Orientation Mid;Lower;Left   Pain Descriptors / Indicators Sharp;Aching;Shooting   Pain Type Chronic pain   Pain Onset More than a month ago   Pain Frequency Constant   Aggravating Factors  sitting, laying on the left side/back, and stomach, work relatedated task   Pain Relieving Factors hot shower/bath, heating pad, pain meidcation   Multiple Pain Sites Yes   Pain Score 4   Pain Location Wrist   Pain Orientation Right;Left  L>R   Pain Descriptors / Indicators Sharp   Pain Type Chronic pain  Pain Onset More than a month ago   Pain Frequency Constant   Aggravating Factors  using hands, and work, writing   Pain Relieving Factors nothing            [facility] PT Assessment - 06/06/15 1518    Assessment   Medical Diagnosis low back and bil wrist pain   Onset Date/Surgical Date --  back 2 years, the wrist 5 months ago   Hand Dominance Right   Next MD Visit call and make an appointment   Prior Therapy yeah   Precautions   Precautions None   Restrictions   Weight Bearing Restrictions No   Balance Screen   Has the patient fallen in the past 6 months No   Has the patient had a decrease in activity level because of a fear of falling?  No   Is the patient reluctant to leave their home because of a fear of falling?  No   Home Environment   Living Environment Private residence   Living Arrangements Spouse/significant other    Available Help at Discharge Available PRN/intermittently;Available 24 hours/day   Type of Home House   Home Access Stairs to enter   Entrance Stairs-Number of Steps 20   Home Layout Two level   Alternate Level Stairs-Number of Steps 3   Prior Function   Level of Independence Independent;Independent with basic ADLs;Independent with household mobility without device;Independent with community mobility without device;Independent with gait;Independent with transfers   Vocation Part time employment  artist   Vocation Requirements using wrist, paintaing, drawing, sitting.    Leisure cooking, make jewelry, painting, drawing   Cognition   Overall Cognitive Status Within Functional Limits for tasks assessed   Posture/Postural Control   Posture/Postural Control Postural limitations   Postural Limitations Rounded Shoulders;Forward head;Increased lumbar lordosis   ROM / Strength   AROM / PROM / Strength AROM;Strength   AROM   Overall AROM  --  bil wrist are WFL with pain during motion   AROM Assessment Site Lumbar;Wrist   Right/Left Wrist Left   Lumbar Flexion 60   Lumbar Extension 10   Lumbar - Right Side Bend 28   Lumbar - Left Side Bend 25   Lumbar - Right Rotation 50%   Lumbar - Left Rotation 50%   Strength   Strength Assessment Site Lumbar;Wrist   Right/Left Wrist Right;Left   Right Wrist Flexion 4+/5  pain during testing   Right Wrist Extension 4+/5   Right Wrist Radial Deviation 4+/5   Right Wrist Ulnar Deviation 4+/5   Left Wrist Flexion 4+/5  pain during testing   Left Wrist Extension 4+/5   Left Wrist Radial Deviation 4+/5   Left Wrist Ulnar Deviation 4+/5   Palpation   Palpation comment tenderness located at the extensor pollicis brevis and longus bil    Special Tests    Special Tests --  Finklesteins test positive bil   Lumbar Tests Slump Test;Prone Knee Bend Test;Straight Leg Raise   Slump test   Findings Negative   Prone Knee Bend Test   Findings Negative    Straight Leg Raise   Findings Negative                           PT Education - 06/06/15 1558    Education provided Yes   Education Details evalution findings, POC, Goals, HEP, anatomical education   Person(s) Educated Patient   Methods Explanation   Comprehension Verbalized understanding  Plan - 06/06/15 1559    Clinical Impression Statement Shelly Lane presents to OPPT with CC of bil wrist pain and low back pain. She reports the low back pain going on for the lest 2 years and the wrist pain for about 5 months. bil wrist she exhibitis full AROM and 4+/5 strength with pain during wrist flexion. palpation reveals tenderness over extensor pollicis Brevis with a postivie finkelsteins test indicaiting possible dequeveins. Low back she demonstrates lmited trunk mobility with pain at end range flexion and during motion with extension. She demonstrates a direction specific preference with extension. All special testing was negative for the lumbarspine. palpation reveals hypomobile intervertebral motion at L1-L5 with pain and spams of bil paraspinals and glutes. educated pt regarding exercises as well as ice cup for the wrist and heat for the back to assist with decreased spams.    Pt will benefit from skilled therapeutic intervention in order to improve on the following deficits Decreased activity tolerance;Decreased endurance;Decreased mobility;Hypomobility;Pain;Obesity;Decreased range of motion;Increased muscle spasms;Improper body mechanics;Postural dysfunction   PT Frequency 1x / week   PT Next Visit Plan Medicaid   PT Home Exercise Plan see HEP handout    Consulted and Agree with Plan of Care Patient         Problem List Patient Active Problem List   Diagnosis Date Noted  . Vaginal delivery 11/15/2014  . Preterm contractions 09/21/2014  . NSVD (normal spontaneous vaginal delivery) 10/05/2013  . History of abnormal cervical Pap smear 10/04/2013   . Elderly multigravida with antepartum condition or complication 02/13/2013  . SAB (spontaneous abortion) x 4 02/13/2013  . Grand multipara 02/13/2013  . Hx of preterm delivery x 2, currently pregnant 02/13/2013  . Hx gestational diabetes x 2 02/13/2013  . Multiple drug allergies 02/13/2013  . Pregnant state, incidental 06/22/2012  . BULIMIA 10/21/2007  . ANXIETY 08/02/2007  . SYMPTOM, PAIN, ABDOMINAL, EPIGASTRIC 08/02/2007  . OBESITY 08/01/2007  . DEPRESSION 08/01/2007   Lulu Riding PT, DPT, LAT, ATC  06/06/2015  4:13 PM    [facility] [address] [place], [place], [address] Phone: [phone]   Fax:  [phone]

## 2015-06-06 NOTE — Patient Instructions (Signed)
   Kynzli Rease PT, DPT, LAT, ATC  [facility] Phone: [phone]     

## 2015-10-14 ENCOUNTER — Ambulatory Visit: Payer: Medicaid Other | Admitting: Obstetrics & Gynecology

## 2015-10-30 ENCOUNTER — Encounter: Payer: Self-pay | Admitting: Family Medicine

## 2015-10-30 ENCOUNTER — Ambulatory Visit (INDEPENDENT_AMBULATORY_CARE_PROVIDER_SITE_OTHER): Payer: Medicaid Other | Admitting: Family Medicine

## 2015-10-30 VITALS — BP 118/76 | HR 93 | Temp 98.4°F | Wt 223.1 lb

## 2015-10-30 DIAGNOSIS — Z3046 Encounter for surveillance of implantable subdermal contraceptive: Secondary | ICD-10-CM | POA: Diagnosis not present

## 2015-10-30 MED ORDER — ETONOGESTREL-ETHINYL ESTRADIOL 0.12-0.015 MG/24HR VA RING: VAGINAL_RING | VAGINAL | Status: DC

## 2015-10-30 MED ORDER — NORETHINDRONE 0.35 MG PO TABS: 1.0000 | ORAL_TABLET | Freq: Every day | ORAL | Status: DC

## 2015-10-30 NOTE — Progress Notes (Signed)
   Subjective:   Shelly Lane is a 39 y.o. female [id number] here for   Chief Complaint  Patient presents with  . Nexplanon removal   #Contraception: patient reports she is not happy with Nexplanon. It is causing vaginal irritation and yeast infections. She has tired the following contraceptive methods with the following complications - IUD- string hurt husband - Depo- has long period for 6 months - Nuva Ring- liked this method but worried about it effecting milk - Pills- she will forget to take - Implanon- liked this method which is why she got nexplanon  Health Maintenance Due  Topic Date Due  . PAP SMEAR  06/23/2015  . INFLUENZA VACCINE  07/08/2015   Review of Systems:   Per HPI. All other systems reviewed and are negative expect as in HPI   PMH, PSH, Medications, Allergies, SocialHx and FHx reviewed and updated in EMR- marked as reviewed 10/30/2015   Objective:  BP 118/76 mmHg  Pulse 93  Temp(Src) 98.4 F (36.9 C)  Wt 223 lb 1.6 oz (101.197 kg)  Gen:  39 y.o. female in NAD. Speaking in full sentences. Good eye contact HEENT: NCAT, MMM CV: RRR Resp: Normal WOB GI: Soft, NTND Ext: WWP, no edema MSK: Intact gait. Full ROM  Nexplanon Removal Patient identified, informed consent performed, consent signed.   Appropriate time out taken. Nexplanon site identified.  Area prepped in usual sterile fashon. One ml of 1% lidocaine was used to anesthetize the area at the distal end of the implant. A small stab incision was made right beside the implant on the distal portion.  The Nexplanon rod was grasped using hemostats and removed without difficulty.  There was minimal blood loss. There were no complications.  3 ml of 1% lidocaine was injected around the incision for post-procedure analgesia.  Steri-strips were applied over the small incision.  A pressure bandage was applied to reduce any bruising.  The patient tolerated the procedure well and was given post procedure  instructions.  Patient is planning to use NuvaRing for contraception.    Assessment:     Shelly MinerLinda E Ramiro is a 39 y.o. female here for nexplanon removal and contraceptive managment  Plan:   #Nexplanon removal- successful  #Contraceptive management- strongly does not desire pregnancy. Rx for POP and Nuvaring provided/sent to pharmacy. She will try NuvaRing and start prophylactic Fenugreek .  If decreased milk supply will use Micronor   Federico FlakeKimberly Niles Newton, MD, [facility] OB Fellow, [facility] 10/30/2015  4:26 PM

## 2015-10-30 NOTE — Patient Instructions (Signed)
Fenugreek 600 TID  Mothers Milk Tea

## 2015-10-30 NOTE — Progress Notes (Signed)
Nexplanon has been in since 12/2014 and wants it removed because she has been having yeast infections, vaginal pain.

## 2016-08-07 ENCOUNTER — Inpatient Hospital Stay (HOSPITAL_COMMUNITY)
Admission: AD | Admit: 2016-08-07 | Discharge: 2016-08-08 | Disposition: A | Discharge status: Home / Self Care | Payer: Medicaid Other | Source: Ambulatory Visit | Attending: Obstetrics & Gynecology | Admitting: Obstetrics & Gynecology

## 2016-08-07 DIAGNOSIS — Z3A01 Less than 8 weeks gestation of pregnancy: Secondary | ICD-10-CM | POA: Insufficient documentation

## 2016-08-07 DIAGNOSIS — K219 Gastro-esophageal reflux disease without esophagitis: Secondary | ICD-10-CM | POA: Insufficient documentation

## 2016-08-07 DIAGNOSIS — Z8619 Personal history of other infectious and parasitic diseases: Secondary | ICD-10-CM | POA: Insufficient documentation

## 2016-08-07 DIAGNOSIS — R109 Unspecified abdominal pain: Secondary | ICD-10-CM | POA: Insufficient documentation

## 2016-08-07 DIAGNOSIS — O210 Mild hyperemesis gravidarum: Secondary | ICD-10-CM | POA: Insufficient documentation

## 2016-08-07 DIAGNOSIS — Z3201 Encounter for pregnancy test, result positive: Secondary | ICD-10-CM | POA: Insufficient documentation

## 2016-08-07 DIAGNOSIS — Z885 Allergy status to narcotic agent status: Secondary | ICD-10-CM | POA: Insufficient documentation

## 2016-08-07 DIAGNOSIS — O418X10 Other specified disorders of amniotic fluid and membranes, first trimester, not applicable or unspecified: Secondary | ICD-10-CM

## 2016-08-07 DIAGNOSIS — O468X1 Other antepartum hemorrhage, first trimester: Secondary | ICD-10-CM

## 2016-08-07 DIAGNOSIS — Z87440 Personal history of urinary (tract) infections: Secondary | ICD-10-CM | POA: Insufficient documentation

## 2016-08-07 DIAGNOSIS — O26859 Spotting complicating pregnancy, unspecified trimester: Secondary | ICD-10-CM

## 2016-08-07 DIAGNOSIS — O26899 Other specified pregnancy related conditions, unspecified trimester: Secondary | ICD-10-CM

## 2016-08-07 DIAGNOSIS — R87620 Atypical squamous cells of undetermined significance on cytologic smear of vagina (ASC-US): Secondary | ICD-10-CM | POA: Insufficient documentation

## 2016-08-07 DIAGNOSIS — O26891 Other specified pregnancy related conditions, first trimester: Secondary | ICD-10-CM | POA: Insufficient documentation

## 2016-08-07 DIAGNOSIS — R42 Dizziness and giddiness: Secondary | ICD-10-CM | POA: Insufficient documentation

## 2016-08-07 DIAGNOSIS — Z9049 Acquired absence of other specified parts of digestive tract: Secondary | ICD-10-CM | POA: Insufficient documentation

## 2016-08-07 DIAGNOSIS — O208 Other hemorrhage in early pregnancy: Secondary | ICD-10-CM | POA: Insufficient documentation

## 2016-08-07 DIAGNOSIS — Z3491 Encounter for supervision of normal pregnancy, unspecified, first trimester: Secondary | ICD-10-CM

## 2016-08-08 ENCOUNTER — Encounter (HOSPITAL_COMMUNITY): Payer: Self-pay | Admitting: Radiology

## 2016-08-08 ENCOUNTER — Inpatient Hospital Stay (HOSPITAL_COMMUNITY): Payer: Medicaid Other

## 2016-08-08 DIAGNOSIS — Z885 Allergy status to narcotic agent status: Secondary | ICD-10-CM | POA: Diagnosis not present

## 2016-08-08 DIAGNOSIS — R87620 Atypical squamous cells of undetermined significance on cytologic smear of vagina (ASC-US): Secondary | ICD-10-CM | POA: Diagnosis not present

## 2016-08-08 DIAGNOSIS — Z3A01 Less than 8 weeks gestation of pregnancy: Secondary | ICD-10-CM | POA: Diagnosis not present

## 2016-08-08 DIAGNOSIS — O43891 Other placental disorders, first trimester: Secondary | ICD-10-CM | POA: Diagnosis not present

## 2016-08-08 DIAGNOSIS — Z87440 Personal history of urinary (tract) infections: Secondary | ICD-10-CM | POA: Diagnosis not present

## 2016-08-08 DIAGNOSIS — R109 Unspecified abdominal pain: Secondary | ICD-10-CM | POA: Diagnosis not present

## 2016-08-08 DIAGNOSIS — Z9049 Acquired absence of other specified parts of digestive tract: Secondary | ICD-10-CM | POA: Diagnosis not present

## 2016-08-08 DIAGNOSIS — K219 Gastro-esophageal reflux disease without esophagitis: Secondary | ICD-10-CM | POA: Diagnosis not present

## 2016-08-08 DIAGNOSIS — O208 Other hemorrhage in early pregnancy: Secondary | ICD-10-CM | POA: Diagnosis not present

## 2016-08-08 DIAGNOSIS — O210 Mild hyperemesis gravidarum: Secondary | ICD-10-CM | POA: Diagnosis not present

## 2016-08-08 DIAGNOSIS — Z3201 Encounter for pregnancy test, result positive: Secondary | ICD-10-CM | POA: Diagnosis not present

## 2016-08-08 DIAGNOSIS — R42 Dizziness and giddiness: Secondary | ICD-10-CM | POA: Diagnosis not present

## 2016-08-08 DIAGNOSIS — O26891 Other specified pregnancy related conditions, first trimester: Secondary | ICD-10-CM | POA: Diagnosis not present

## 2016-08-08 DIAGNOSIS — Z8619 Personal history of other infectious and parasitic diseases: Secondary | ICD-10-CM | POA: Diagnosis not present

## 2016-08-08 LAB — URINE MICROSCOPIC-ADD ON

## 2016-08-08 LAB — URINALYSIS, ROUTINE W REFLEX MICROSCOPIC
Bilirubin Urine: NEGATIVE
Glucose, UA: NEGATIVE mg/dL
Hgb urine dipstick: NEGATIVE
Ketones, ur: NEGATIVE mg/dL
Leukocytes, UA: NEGATIVE
Nitrite: POSITIVE — AB
Protein, ur: NEGATIVE mg/dL
Specific Gravity, Urine: >1.03 — ABNORMAL HIGH (ref 1.005–1.030)
pH: 6 (ref 5.0–8.0)

## 2016-08-08 LAB — CBC
HCT: 37.4 % (ref 36.0–46.0)
Hemoglobin: 12.5 g/dL (ref 12.0–15.0)
MCH: 28.4 pg (ref 26.0–34.0)
MCHC: 33.4 g/dL (ref 30.0–36.0)
MCV: 85 fL (ref 78.0–100.0)
Platelets: 241 10*3/uL (ref 150–400)
RBC: 4.4 MIL/uL (ref 3.87–5.11)
RDW: 13.6 % (ref 11.5–15.5)
WBC: 6.2 10*3/uL (ref 4.0–10.5)

## 2016-08-08 LAB — ABO/RH: ABO/RH(D): O POS

## 2016-08-08 LAB — WET PREP, GENITAL
Clue Cells Wet Prep HPF POC: NONE SEEN
Sperm: NONE SEEN
Trich, Wet Prep: NONE SEEN
Yeast Wet Prep HPF POC: NONE SEEN

## 2016-08-08 LAB — HCG, QUANTITATIVE, PREGNANCY: hCG, Beta Chain, Quant, S: 38936 m[IU]/mL — ABNORMAL HIGH (ref <5)

## 2016-08-08 LAB — POCT PREGNANCY, URINE: Preg Test, Ur: POSITIVE — AB

## 2016-08-08 LAB — HIV ANTIBODY (ROUTINE TESTING W REFLEX): HIV Screen 4th Generation wRfx: NONREACTIVE

## 2016-08-08 NOTE — MAU Provider Note (Signed)
History     CSN: [id number]  Arrival date and time: 08/07/16 2352   First Provider Initiated Contact with Patient 08/08/16 0136      Chief Complaint  Patient presents with  . Abdominal Cramping  . Dizziness  . Morning Sickness  . Vaginal Bleeding   HPI   [name] is a [id number] @ [redacted]w[redacted]d here in [facility]U with complaints of vaginal spotting and abdominal cramping.  The vaginal spotting started this morning; it started as pink then progressed to brown. It is not as heavy as a period. She has not noticed it since she has arrived to MAU.   The lower abdominal cramping started this morning. The pain radiates to her lower back. The pain is constant. She took percocet for her chronic back pain this morning. She has pain management for this.   She plans to start care in the [facility]. Patient is present today with 2 of her children.   OB History    Gravida Para Term Preterm AB Living   12 8 6 2 3 8    SAB TAB Ectopic Multiple Live Births   3     0 8      Past Medical History:  Diagnosis Date  . Abnormal Pap smear    Colpo;Last pap 2012, ASCUS w/ negative HPV  . Anemia    during preg  . Anxiety   . ASCUS with positive high risk HPV   . Depression    ok now  . Eczema   . GERD (gastroesophageal reflux disease)   . Gestational diabetes   . H/O candidiasis   . H/O migraine   . H/O rubella   . H/O varicella   . H/O: depression   . H/O: obesity   . Headache(784.0)   . Hx: UTI (urinary tract infection)   . Miscarriage    3/13 and 7/13   . Preterm labor     Past Surgical History:  Procedure Laterality Date  . CHOLECYSTECTOMY  2008  . HIATAL HERNIA REPAIR  2012  . WISDOM TOOTH EXTRACTION      Family History  Problem Relation Age of Onset  . Diabetes Maternal Grandmother   . Hypertension Maternal Grandmother   . Anemia Mother     Social History  Substance Use Topics  . Smoking status: Never Smoker  . Smokeless tobacco: Never Used  . Alcohol use No     Allergies:  Allergies  Allergen Reactions  . Vicodin [Hydrocodone-Acetaminophen] Itching    Prescriptions Prior to Admission  Medication Sig Dispense Refill Last Dose  . clobetasol cream (TEMOVATE) 0.05 % APPLY BID TO HANDS PRN  1 08/07/2016 at Unknown time  . cyclobenzaprine (FLEXERIL) 10 MG tablet TK 1 T PO QHS  2 08/07/2016 at 2330  . DESONIDE EX Apply 1 application topically daily.   08/07/2016 at Unknown time  . ibuprofen (ADVIL,MOTRIN) 600 MG tablet Take 1 tablet (600 mg total) by mouth every 6 (six) hours. 30 tablet 2 Past Month at Unknown time  . oxyCODONE-acetaminophen (PERCOCET/ROXICET) 5-325 MG per tablet Take 1 tablet by mouth every 4 (four) hours as needed (for pain scale less than 7). 30 tablet 0 08/07/2016 at 2330  . etonogestrel-ethinyl estradiol (NUVARING) 0.12-0.015 MG/24HR vaginal ring Insert vaginally and leave in place for 3 consecutive weeks, then remove for 1 week. 1 each 12   . fluticasone (FLONASE) 50 MCG/ACT nasal spray Place 2 sprays into both nostrils daily.   More than a month at Unknown  time  . loratadine (CLARITIN) 10 MG tablet Take 10 mg by mouth daily.   More than a month at Unknown time  . norethindrone (MICRONOR,CAMILA,ERRIN) 0.35 MG tablet Take 1 tablet (0.35 mg total) by mouth daily. 1 Package 11    Results for orders placed or performed during the hospital encounter of 08/07/16 (from the past 48 hour(s))  Urinalysis, Routine w reflex microscopic (not at [facility])     Status: Abnormal   Collection Time: 08/08/16 12:50 AM  Result Value Ref Range   Color, Urine YELLOW YELLOW   APPearance CLEAR CLEAR   Specific Gravity, Urine >1.030 (H) 1.005 - 1.030   pH 6.0 5.0 - 8.0   Glucose, UA NEGATIVE NEGATIVE mg/dL   Hgb urine dipstick NEGATIVE NEGATIVE   Bilirubin Urine NEGATIVE NEGATIVE   Ketones, ur NEGATIVE NEGATIVE mg/dL   Protein, ur NEGATIVE NEGATIVE mg/dL   Nitrite POSITIVE (A) NEGATIVE   Leukocytes, UA NEGATIVE NEGATIVE  Urine microscopic-add on      Status: Abnormal   Collection Time: 08/08/16 12:50 AM  Result Value Ref Range   Squamous Epithelial / LPF 0-5 (A) NONE SEEN   WBC, UA 0-5 0 - 5 WBC/hpf   RBC / HPF 0-5 0 - 5 RBC/hpf   Bacteria, UA MANY (A) NONE SEEN  Pregnancy, urine POC     Status: Abnormal   Collection Time: 08/08/16  1:05 AM  Result Value Ref Range   Preg Test, Ur POSITIVE (A) NEGATIVE    Comment:        THE SENSITIVITY OF THIS METHODOLOGY IS >24 mIU/mL   CBC     Status: None   Collection Time: 08/08/16  1:25 AM  Result Value Ref Range   WBC 6.2 4.0 - 10.5 K/uL   RBC 4.40 3.87 - 5.11 MIL/uL   Hemoglobin 12.5 12.0 - 15.0 g/dL   HCT 47.837.4 29.536.0 - 62.146.0 %   MCV 85.0 78.0 - 100.0 fL   MCH 28.4 26.0 - 34.0 pg   MCHC 33.4 30.0 - 36.0 g/dL   RDW 30.813.6 65.711.5 - 84.615.5 %   Platelets 241 150 - 400 K/uL   [place] Ob Comp Less 14 Wks  Result Date: 08/08/2016 CLINICAL DATA:  Spotting and cramping. Unknown last menstrual period. Quantitative beta HCG is 38,936. EXAM: OBSTETRIC <14 WK US AND TRANSVAGINAL OB US TECHNIQUE: Both transabdominal and transvaginal ultrasound examinations were performed for complete evaluation of the gestation as well as the maternal uterus, adnexal regions, and pelvic cul-de-sac. Transvaginal technique was performed to assess early pregnancy. COMPARISON:  None. FINDINGS: Intrauterine gestational sac: Single intrauterine gestational sac is present. Yolk sac:  Yolk sac is present. Embryo:  Fetal pole is present. Cardiac Activity: Fetal cardiac activity is observed. Heart Rate: 120  bpm CRL:  3.8  mm   6 w   0 d                  US EDC: 04/03/2017 Subchorionic hemorrhage: A small subchorionic hemorrhage is demonstrated anteriorly and towards the right. Maternal uterus/adnexae: The uterus is anteverted. No myometrial mass lesions are demonstrated. Both ovaries are visualized and appear normal. A corpus luteal cyst is demonstrated on the left ovary. No free fluid in the pelvis. IMPRESSION: Single intrauterine pregnancy.  Estimated gestational age by crown rump length is 6 weeks 0 days. Small subchorionic hemorrhage is present. Electronically Signed   By: Burman NievesWilliam  Stevens M.D.   On: 08/08/2016 03:15   [place] Ob Transvaginal  Result Date:  08/08/2016 CLINICAL DATA:  Spotting and cramping. Unknown last menstrual period. Quantitative beta HCG is 38,936. EXAM: OBSTETRIC <14 WK [place] AND TRANSVAGINAL OB US TECHNIQUE: Both transabdominal and transvaginal ultrasound examinations were performed for complete evaluation of the gestation as well as the maternal uterus, adnexal regions, and pelvic cul-de-sac. Transvaginal technique was performed to assess early pregnancy. COMPARISON:  None. FINDINGS: Intrauterine gestational sac: Single intrauterine gestational sac is present. Yolk sac:  Yolk sac is present. Embryo:  Fetal pole is present. Cardiac Activity: Fetal cardiac activity is observed. Heart Rate: 120  bpm CRL:  3.8  mm   6 w   0 d                  [place] EDC: 04/03/2017 Subchorionic hemorrhage: A small subchorionic hemorrhage is demonstrated anteriorly and towards the right. Maternal uterus/adnexae: The uterus is anteverted. No myometrial mass lesions are demonstrated. Both ovaries are visualized and appear normal. A corpus luteal cyst is demonstrated on the left ovary. No free fluid in the pelvis. IMPRESSION: Single intrauterine pregnancy. Estimated gestational age by crown rump length is 6 weeks 0 days. Small subchorionic hemorrhage is present. Electronically Signed   By: Burman Nieves M.D.   On: 08/08/2016 03:15   Review of Systems  Constitutional: Negative for chills and fever.  Gastrointestinal: Positive for abdominal pain and nausea. Negative for vomiting.  Genitourinary: Positive for frequency. Negative for dysuria and urgency.   Physical Exam   Blood pressure 130/67, pulse 94, temperature 98.3 F (36.8 C), resp. rate 18, height 5\' 7"  (1.702 m), weight 229 lb 6.4 oz (104.1 kg), currently breastfeeding.  Physical Exam   Constitutional: She is oriented to person, place, and time. She appears well-developed and well-nourished. No distress.  HENT:  Head: Normocephalic.  GI: Soft. She exhibits no distension and no mass. There is no tenderness. There is no rebound and no guarding.  Genitourinary:  Genitourinary Comments: Wet prep and GC collected without speculum. No blood noted on swab.   Musculoskeletal: Normal range of motion.  Neurological: She is alert and oriented to person, place, and time.  Skin: Skin is warm. She is not diaphoretic.  Psychiatric: Her behavior is normal.    MAU Course  Procedures  None  MDM  Wet prep  GC [place] CBC Hcg O positive blood type   Assessment and Plan   A:  1. Subchorionic hematoma in first trimester   2. Abdominal pain in pregnancy, antepartum   3. Uncertain dates, antepartum, first trimester   4. Spotting in early pregnancy     P:  Discharge home in stable condition Start prenatal care  Prenatal vitamins daily.  Bleeding precautions Pelvic rest Return to MAU if symptoms worsen   Duane Lope, NP 08/10/2016 10:02 AM

## 2016-08-08 NOTE — Discharge Instructions (Signed)
Abdominal Pain During Pregnancy °Abdominal pain is common in pregnancy. Most of the time, it does not cause harm. There are many causes of abdominal pain. Some causes are more serious than others. Some of the causes of abdominal pain in pregnancy are easily diagnosed. Occasionally, the diagnosis takes time to understand. Other times, the cause is not determined. Abdominal pain can be a sign that something is very wrong with the pregnancy, or the pain may have nothing to do with the pregnancy at all. For this reason, always tell your health care provider if you have any abdominal discomfort. °HOME CARE INSTRUCTIONS  °Monitor your abdominal pain for any changes. The following actions may help to alleviate any discomfort you are experiencing: °· Do not have sexual intercourse or put anything in your vagina until your symptoms go away completely. °· Get plenty of rest until your pain improves. °· Drink clear fluids if you feel nauseous. Avoid solid food as long as you are uncomfortable or nauseous. °· Only take over-the-counter or prescription medicine as directed by your health care provider. °· Keep all follow-up appointments with your health care provider. °SEEK IMMEDIATE MEDICAL CARE IF: °· You are bleeding, leaking fluid, or passing tissue from the vagina. °· You have increasing pain or cramping. °· You have persistent vomiting. °· You have painful or bloody urination. °· You have a fever. °· You notice a decrease in your baby's movements. °· You have extreme weakness or feel faint. °· You have shortness of breath, with or without abdominal pain. °· You develop a severe headache with abdominal pain. °· You have abnormal vaginal discharge with abdominal pain. °· You have persistent diarrhea. °· You have abdominal pain that continues even after rest, or gets worse. °MAKE SURE YOU:  °· Understand these instructions. °· Will watch your condition. °· Will get help right away if you are not doing well or get worse. °    °This information is not intended to replace advice given to you by your health care provider. Make sure you discuss any questions you have with your health care provider. °  °Document Released: 11/23/2005 Document Revised: 09/13/2013 Document Reviewed: 06/22/2013 °Elsevier Interactive Patient Education ©2016 [employer] °Subchorionic Hematoma °A subchorionic hematoma is a gathering of blood between the outer wall of the placenta and the inner wall of the womb (uterus). The placenta is the organ that connects the fetus to the wall of the uterus. The placenta performs the feeding, breathing (oxygen to the fetus), and waste removal (excretory work) of the fetus.  °Subchorionic hematoma is the most common abnormality found on a result from ultrasonography done during the first trimester or early second trimester of pregnancy. If there has been little or no vaginal bleeding, early small hematomas usually shrink on their own and do not affect your baby or pregnancy. The blood is gradually absorbed over 1-2 weeks. When bleeding starts later in pregnancy or the hematoma is larger or occurs in an older pregnant woman, the outcome may not be as good. Larger hematomas may get bigger, which increases the chances for miscarriage. Subchorionic hematoma also increases the risk of premature detachment of the placenta from the uterus, preterm (premature) labor, and stillbirth. °HOME CARE INSTRUCTIONS °· Stay on bed rest if your health care provider recommends this. Although bed rest will not prevent more bleeding or prevent a miscarriage, your health care provider may recommend bed rest until you are advised otherwise. °· Avoid heavy lifting (more than 10 lb [4.5 kg]),   exercise, sexual intercourse, or douching as directed by your health care provider. °· Keep track of the number of pads you use each day and how soaked (saturated) they are. Write down this information. °· Do not use tampons. °· Keep all follow-up appointments as  directed by your health care provider. Your health care provider may ask you to have follow-up blood tests or ultrasound tests or both. °SEEK IMMEDIATE MEDICAL CARE IF: °· You have severe cramps in your stomach, back, abdomen, or pelvis. °· You have a fever. °· You pass large clots or tissue. Save any tissue for your health care provider to look at. °· Your bleeding increases or you become lightheaded, feel weak, or have fainting episodes. °  °This information is not intended to replace advice given to you by your health care provider. Make sure you discuss any questions you have with your health care provider. °  °Document Released: 03/10/2007 Document Revised: 12/14/2014 Document Reviewed: 06/22/2013 °Elsevier Interactive Patient Education ©2016 [employer] ° ° °

## 2016-08-08 NOTE — MAU Note (Signed)
Does not know LMP. Abd cramping, spotting, dizzy, and nausea today. Positive home upt.

## 2016-08-11 LAB — GC/CHLAMYDIA PROBE AMP (~~LOC~~) NOT AT ARMC
Chlamydia: NEGATIVE
Neisseria Gonorrhea: NEGATIVE

## 2016-09-08 ENCOUNTER — Ambulatory Visit (INDEPENDENT_AMBULATORY_CARE_PROVIDER_SITE_OTHER): Payer: Medicaid Other | Admitting: Certified Nurse Midwife

## 2016-09-08 ENCOUNTER — Encounter: Payer: Self-pay | Admitting: *Deleted

## 2016-09-08 ENCOUNTER — Other Ambulatory Visit (HOSPITAL_COMMUNITY)
Admission: RE | Admit: 2016-09-08 | Discharge: 2016-09-08 | Disposition: A | Discharge status: Home / Self Care | Payer: Medicaid Other | Source: Ambulatory Visit | Attending: Certified Nurse Midwife | Admitting: Certified Nurse Midwife

## 2016-09-08 ENCOUNTER — Encounter: Payer: Self-pay | Admitting: Obstetrics and Gynecology

## 2016-09-08 VITALS — BP 103/75 | HR 99 | Temp 99.3°F | Wt 233.2 lb

## 2016-09-08 DIAGNOSIS — Z641 Problems related to multiparity: Secondary | ICD-10-CM

## 2016-09-08 DIAGNOSIS — Z01411 Encounter for gynecological examination (general) (routine) with abnormal findings: Secondary | ICD-10-CM | POA: Insufficient documentation

## 2016-09-08 DIAGNOSIS — O0990 Supervision of high risk pregnancy, unspecified, unspecified trimester: Secondary | ICD-10-CM | POA: Insufficient documentation

## 2016-09-08 DIAGNOSIS — O0991 Supervision of high risk pregnancy, unspecified, first trimester: Secondary | ICD-10-CM | POA: Diagnosis not present

## 2016-09-08 DIAGNOSIS — Z1151 Encounter for screening for human papillomavirus (HPV): Secondary | ICD-10-CM | POA: Diagnosis not present

## 2016-09-08 DIAGNOSIS — O09521 Supervision of elderly multigravida, first trimester: Secondary | ICD-10-CM

## 2016-09-08 DIAGNOSIS — O2341 Unspecified infection of urinary tract in pregnancy, first trimester: Secondary | ICD-10-CM

## 2016-09-08 DIAGNOSIS — Z113 Encounter for screening for infections with a predominantly sexual mode of transmission: Secondary | ICD-10-CM | POA: Insufficient documentation

## 2016-09-08 DIAGNOSIS — Z23 Encounter for immunization: Secondary | ICD-10-CM | POA: Diagnosis not present

## 2016-09-08 MED ORDER — NITROFURANTOIN MONOHYD MACRO 100 MG PO CAPS: 100.0000 mg | ORAL_CAPSULE | Freq: Two times a day (BID) | ORAL | 0 refills | Status: DC

## 2016-09-08 NOTE — Progress Notes (Signed)
Subjective:    Shelly Lane is being seen today for her first obstetrical visit.  This is not a planned pregnancy. She is at [redacted]w[redacted]d gestation. 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 Relationship with FOB: spouse, living together. Patient does intend to breast feed. Pregnancy history fully reviewed.  The information documented in the HPI was reviewed and verified.  Menstrual History: OB History    Gravida Para Term Preterm AB Living   12 8 6 2 3 8    SAB TAB Ectopic Multiple Live Births   3     0 8       No LMP recorded (lmp unknown). Patient is pregnant.    Past Medical History:  Diagnosis Date  . Abnormal Pap smear    Colpo;Last pap 2012, ASCUS w/ negative HPV  . Anemia    during preg  . Anxiety   . ASCUS with positive high risk HPV   . Depression    ok now  . Eczema   . GERD (gastroesophageal reflux disease)   . Gestational diabetes   . H/O candidiasis   . H/O migraine   . H/O rubella   . H/O varicella   . H/O: depression   . H/O: obesity   . Headache(784.0)   . Hx: UTI (urinary tract infection)   . Miscarriage    3/13 and 7/13   . Preterm labor     Past Surgical History:  Procedure Laterality Date  . CHOLECYSTECTOMY  2008  . HERNIA REPAIR  2012  . HIATAL HERNIA REPAIR  2012  . WISDOM TOOTH EXTRACTION       (Not in a hospital admission) Allergies  Allergen Reactions  . Vicodin [Hydrocodone-Acetaminophen] Itching    Social History  Substance Use Topics  . Smoking status: Never Smoker  . Smokeless tobacco: Never Used  . Alcohol use No    Family History  Problem Relation Age of Onset  . Diabetes Maternal Grandmother   . Hypertension Maternal Grandmother   . Anemia Mother      Review of Systems Constitutional: negative for weight loss Gastrointestinal: negative for vomiting Genitourinary:negative for genital lesions and vaginal discharge and dysuria Musculoskeletal:+ for chronic back  pain, goes to pain clinic Behavioral/Psych: negative for abusive relationship, depression, illegal drug usage and tobacco use + Rx drug abuse   Objective:    BP 103/75   Pulse 99   Temp 99.3 F (37.4 C)   Wt 233 lb 3.2 oz (105.8 kg)   LMP  (LMP Unknown)   BMI 36.52 kg/m  General Appearance:    Alert, cooperative, no distress, appears stated age  Head:    Normocephalic, without obvious abnormality, atraumatic  Eyes:    PERRL, conjunctiva/corneas clear, EOM's intact, fundi    benign, both eyes  Ears:    Normal TM's and external ear canals, both ears  Nose:   Nares normal, septum midline, mucosa normal, no drainage    or sinus tenderness  Throat:   Lips, mucosa, and tongue normal; teeth and gums normal  Neck:   Supple, symmetrical, trachea midline, no adenopathy;    thyroid:  no enlargement/tenderness/nodules; no carotid   bruit or JVD  Back:     Symmetric, no curvature, ROM normal, no CVA tenderness  Lungs:     Clear to auscultation bilaterally, respirations unlabored  Chest Wall:    No tenderness or deformity   Heart:    Regular rate and rhythm, S1 and S2  normal, no murmur, rub   or gallop  Breast Exam:    No tenderness, masses, or nipple abnormality  Abdomen:     Soft, non-tender, bowel sounds active all four quadrants,    no masses, no organomegaly  Genitalia:    Normal female without lesion, discharge or tenderness  Extremities:   Extremities normal, atraumatic, no cyanosis or edema  Pulses:   2+ and symmetric all extremities  Skin:   Skin color, texture, turgor normal, no rashes or lesions  Lymph nodes:   Cervical, supraclavicular, and axillary nodes normal  Neurologic:   CNII-XII intact, normal strength, sensation and reflexes    throughout     Cervix: long, thick, closed, midline.  Friable on exam      FHR: seen with bedside US.  CRL: consistent with dates.    Lab Review Urine pregnancy test Labs reviewed yes Radiologic studies reviewed yes Assessment:    Pregnancy  at [redacted]w[redacted]d weeks   H/O PTL   H/O GDM  Friable cervix  Chronic back pain  Plan:     [place] @ MFM Prenatal vitamins.  Counseling provided regarding continued use of seat belts, cessation of alcohol consumption, smoking or use of illicit drugs; infection precautions i.e., influenza/TDAP immunizations, toxoplasmosis,CMV, parvovirus, listeria and varicella; workplace safety, exercise during pregnancy; routine dental care, safe medications, sexual activity, hot tubs, saunas, pools, travel, caffeine use, fish and methlymercury, potential toxins, hair treatments, varicose veins Weight gain recommendations per IOM guidelines reviewed: underweight/BMI< 18.5--> gain 28 - 40 lbs; normal weight/BMI 18.5 - 24.9--> gain 25 - 35 lbs; overweight/BMI 25 - 29.9--> gain 15 - 25 lbs; obese/BMI >30->gain  11 - 20 lbs Problem list reviewed and updated. FIRST/CF mutation testing/NIPT/QUAD SCREEN/fragile X/Ashkenazi Jewish population testing/Spinal muscular atrophy discussed: ordered. Role of ultrasound in pregnancy discussed; fetal survey: requested. Amniocentesis discussed: undecided. VBAC calculator score: VBAC consent form provided Meds ordered this encounter  Medications  . Prenatal Vit-Fe Fumarate-FA (PRENATAL MULTIVITAMIN) TABS tablet    Sig: Take 1 tablet by mouth daily at 12 noon.  . nitrofurantoin, macrocrystal-monohydrate, (MACROBID) 100 MG capsule    Sig: Take 1 capsule (100 mg total) by mouth 2 (two) times daily.    Dispense:  14 capsule    Refill:  0   Orders Placed This Encounter  Procedures  . Culture, OB Urine  . [place] MFM Fetal Nuchal Translucency    Standing Status:   Future    Standing Expiration Date:   11/08/2017    Order Specific Question:   Reason for Exam (SYMPTOM  OR DIAGNOSIS REQUIRED)    Answer:   AMA    Order Specific Question:   Preferred imaging location?    Answer:   [facility]  . Flu Vaccine QUAD 36+ mos IM  . ToxASSURE Select 13 (MW), Urine  . TSH  . HIV antibody  .  Hemoglobinopathy evaluation  . Varicella zoster antibody, IgG  . Prenatal Profile I  . MaterniT Genome    Order Specific Question:   Is the patient insulin dependent?    Answer:   No    Order Specific Question:   Please enter gestational age. This should be expressed as weeks AND days, i.e. 16w 6d. Enter weeks here. Enter days in next question.    Answer:   68    Order Specific Question:   Please enter gestational age. This should be expressed as weeks AND days, i.e. 16w 6d. Enter days here. Enter weeks in previous question.  Answer:   3    Order Specific Question:   How was gestational age calculated?    Answer:   LMP    Order Specific Question:   Please give the date of LMP OR Ultrasound OR Estimated date of delivery.    Answer:   04/03/2017    Order Specific Question:   Number of Fetuses (Type of Pregnancy):    Answer:   1    Order Specific Question:   Indications for performing the test? (please choose all that apply):    Answer:   Routine screening    Order Specific Question:   Other Indications? (Y=Yes, N=No)    Answer:   Y    Comments:   AMA    Order Specific Question:   Please specify other indications, if any:    Answer:   AMA, grandmultiparity    Order Specific Question:   If this is a repeat specimen, please indicate the reason:    Answer:   Not indicated    Order Specific Question:   Please specify the patient's race: (C=White/Caucasion, B=Black, I=Native American, A=Asian, H=Hispanic, O=Other, U=Unknown)    Answer:   B    Order Specific Question:   Donor Egg - indicate if the egg was obtained from in vitro fertilization.    Answer:   N    Order Specific Question:   Age of Egg Donor.    Answer:   [id number]    Order Specific Question:   Prior Down Syndrome/ONTD screening during current pregnancy.    Answer:   N    Order Specific Question:   Prior First Trimester Testing    Answer:   N    Order Specific Question:   Prior Second Trimester Testing    Answer:   N    Order  Specific Question:   Family History of Neural Tube Defects    Answer:   N    Order Specific Question:   Prior Pregnancy with Down Syndrome    Answer:   N    Order Specific Question:   Please give the patient's weight (in pounds)    Answer:   233  . Hemoglobin A1c  . AMB referral to maternal fetal medicine    Referral Priority:   Routine    Referral Type:   Consultation    Referral Reason:   Specialty Services Required    Number of Visits Requested:   1    Follow up in 4 weeks. 50% of 30 min visit spent on counseling and coordination of care.

## 2016-09-08 NOTE — Progress Notes (Signed)
Patient stated that she has been having back pain. Patient requested a to see a female provider due to religious reasons. She stated that in an emergency situation a female is ok, but would prefer to see females in the office.   Administered flu vaccine, patient tolerated well.

## 2016-09-08 NOTE — Patient Instructions (Addendum)
First Trimester of Pregnancy The first trimester of pregnancy is from week 1 until the end of week 12 (months 1 through 3). A week after a sperm fertilizes an egg, the egg will implant on the wall of the uterus. This embryo will begin to develop into a baby. Genes from you and your partner are forming the baby. The female genes determine whether the baby is a boy or a girl. At 6-8 weeks, the eyes and face are formed, and the heartbeat can be seen on ultrasound. At the end of 12 weeks, all the baby's organs are formed.  Now that you are pregnant, you will want to do everything you can to have a healthy baby. Two of the most important things are to get good prenatal care and to follow your health care provider's instructions. Prenatal care is all the medical care you receive before the baby's birth. This care will help prevent, find, and treat any problems during the pregnancy and childbirth. BODY CHANGES Your body goes through many changes during pregnancy. The changes vary from woman to woman.   You may gain or lose a couple of pounds at first.  You may feel sick to your stomach (nauseous) and throw up (vomit). If the vomiting is uncontrollable, call your health care provider.  You may tire easily.  You may develop headaches that can be relieved by medicines approved by your health care provider.  You may urinate more often. Painful urination may mean you have a bladder infection.  You may develop heartburn as a result of your pregnancy.  You may develop constipation because certain hormones are causing the muscles that push waste through your intestines to slow down.  You may develop hemorrhoids or swollen, bulging veins (varicose veins).  Your breasts may begin to grow larger and become tender. Your nipples may stick out more, and the tissue that surrounds them (areola) may become darker.  Your gums may bleed and may be sensitive to brushing and flossing.  Dark spots or blotches (chloasma,  mask of pregnancy) may develop on your face. This will likely fade after the baby is born.  Your menstrual periods will stop.  You may have a loss of appetite.  You may develop cravings for certain kinds of food.  You may have changes in your emotions from day to day, such as being excited to be pregnant or being concerned that something may go wrong with the pregnancy and baby.  You may have more vivid and strange dreams.  You may have changes in your hair. These can include thickening of your hair, rapid growth, and changes in texture. Some women also have hair loss during or after pregnancy, or hair that feels dry or thin. Your hair will most likely return to normal after your baby is born. WHAT TO EXPECT AT YOUR PRENATAL VISITS During a routine prenatal visit:  You will be weighed to make sure you and the baby are growing normally.  Your blood pressure will be taken.  Your abdomen will be measured to track your baby's growth.  The fetal heartbeat will be listened to starting around week 10 or 12 of your pregnancy.  Test results from any previous visits will be discussed. Your health care provider may ask you:  How you are feeling.  If you are feeling the baby move.  If you have had any abnormal symptoms, such as leaking fluid, bleeding, severe headaches, or abdominal cramping.  If you are using any tobacco products,   including cigarettes, chewing tobacco, and electronic cigarettes.  If you have any questions. Other tests that may be performed during your first trimester include:  Blood tests to find your blood type and to check for the presence of any previous infections. They will also be used to check for low iron levels (anemia) and Rh antibodies. Later in the pregnancy, blood tests for diabetes will be done along with other tests if problems develop.  Urine tests to check for infections, diabetes, or protein in the urine.  An ultrasound to confirm the proper growth  and development of the baby.  An amniocentesis to check for possible genetic problems.  Fetal screens for spina bifida and Down syndrome.  You may need other tests to make sure you and the baby are doing well.  HIV (human immunodeficiency virus) testing. Routine prenatal testing includes screening for HIV, unless you choose not to have this test. HOME CARE INSTRUCTIONS  Medicines  Follow your health care provider's instructions regarding medicine use. Specific medicines may be either safe or unsafe to take during pregnancy.  Take your prenatal vitamins as directed.  If you develop constipation, try taking a stool softener if your health care provider approves. Diet  Eat regular, well-balanced meals. Choose a variety of foods, such as meat or vegetable-based protein, fish, milk and low-fat dairy products, vegetables, fruits, and whole grain breads and cereals. Your health care provider will help you determine the amount of weight gain that is right for you.  Avoid raw meat and uncooked cheese. These carry germs that can cause birth defects in the baby.  Eating four or five small meals rather than three large meals a day may help relieve nausea and vomiting. If you start to feel nauseous, eating a few soda crackers can be helpful. Drinking liquids between meals instead of during meals also seems to help nausea and vomiting.  If you develop constipation, eat more high-fiber foods, such as fresh vegetables or fruit and whole grains. Drink enough fluids to keep your urine clear or pale yellow. Activity and Exercise  Exercise only as directed by your health care provider. Exercising will help you:  Control your weight.  Stay in shape.  Be prepared for labor and delivery.  Experiencing pain or cramping in the lower abdomen or low back is a good sign that you should stop exercising. Check with your health care provider before continuing normal exercises.  Try to avoid standing for long  periods of time. Move your legs often if you must stand in one place for a long time.  Avoid heavy lifting.  Wear low-heeled shoes, and practice good posture.  You may continue to have sex unless your health care provider directs you otherwise. Relief of Pain or Discomfort  Wear a good support bra for breast tenderness.   Take warm sitz baths to soothe any pain or discomfort caused by hemorrhoids. Use hemorrhoid cream if your health care provider approves.   Rest with your legs elevated if you have leg cramps or low back pain.  If you develop varicose veins in your legs, wear support hose. Elevate your feet for 15 minutes, 3-4 times a day. Limit salt in your diet. Prenatal Care  Schedule your prenatal visits by the twelfth week of pregnancy. They are usually scheduled monthly at first, then more often in the last 2 months before delivery.  Write down your questions. Take them to your prenatal visits.  Keep all your prenatal visits as directed by your   health care provider. Safety  Wear your seat belt at all times when driving.  Make a list of emergency phone numbers, including numbers for family, friends, the hospital, and police and fire departments. General Tips  Ask your health care provider for a referral to a local prenatal education class. Begin classes no later than at the beginning of month 6 of your pregnancy.  Ask for help if you have counseling or nutritional needs during pregnancy. Your health care provider can offer advice or refer you to specialists for help with various needs.  Do not use hot tubs, steam rooms, or saunas.  Do not douche or use tampons or scented sanitary pads.  Do not cross your legs for long periods of time.  Avoid cat litter boxes and soil used by cats. These carry germs that can cause birth defects in the baby and possibly loss of the fetus by miscarriage or stillbirth.  Avoid all smoking, herbs, alcohol, and medicines not prescribed by  your health care provider. Chemicals in these affect the formation and growth of the baby.  Do not use any tobacco products, including cigarettes, chewing tobacco, and electronic cigarettes. If you need help quitting, ask your health care provider. You may receive counseling support and other resources to help you quit.  Schedule a dentist appointment. At home, brush your teeth with a soft toothbrush and be gentle when you floss. SEEK MEDICAL CARE IF:   You have dizziness.  You have mild pelvic cramps, pelvic pressure, or nagging pain in the abdominal area.  You have persistent nausea, vomiting, or diarrhea.  You have a bad smelling vaginal discharge.  You have pain with urination.  You notice increased swelling in your face, hands, legs, or ankles. SEEK IMMEDIATE MEDICAL CARE IF:   You have a fever.  You are leaking fluid from your vagina.  You have spotting or bleeding from your vagina.  You have severe abdominal cramping or pain.  You have rapid weight gain or loss.  You vomit blood or material that looks like coffee grounds.  You are exposed to [place] measles and have never had them.  You are exposed to fifth disease or chickenpox.  You develop a severe headache.  You have shortness of breath.  You have any kind of trauma, such as from a fall or a car accident.   This information is not intended to replace advice given to you by your health care provider. Make sure you discuss any questions you have with your health care provider.   Document Released: 11/17/2001 Document Revised: 12/14/2014 Document Reviewed: 10/03/2013 Elsevier Interactive Patient Education 2016 [employer]  Urinary Tract Infection Urinary tract infections (UTIs) can develop anywhere along your urinary tract. Your urinary tract is your body's drainage system for removing wastes and extra water. Your urinary tract includes two kidneys, two ureters, a bladder, and a urethra. Your kidneys are a  pair of bean-shaped organs. Each kidney is about the size of your fist. They are located below your ribs, one on each side of your spine. CAUSES Infections are caused by microbes, which are microscopic organisms, including fungi, viruses, and bacteria. These organisms are so small that they can only be seen through a microscope. Bacteria are the microbes that most commonly cause UTIs. SYMPTOMS  Symptoms of UTIs may vary by age and gender of the patient and by the location of the infection. Symptoms in young women typically include a frequent and intense urge to urinate and a painful,  burning feeling in the bladder or urethra during urination. Older women and men are more likely to be tired, shaky, and weak and have muscle aches and abdominal pain. A fever may mean the infection is in your kidneys. Other symptoms of a kidney infection include pain in your back or sides below the ribs, nausea, and vomiting. DIAGNOSIS To diagnose a UTI, your caregiver will ask you about your symptoms. Your caregiver will also ask you to provide a urine sample. The urine sample will be tested for bacteria and white blood cells. White blood cells are made by your body to help fight infection. TREATMENT  Typically, UTIs can be treated with medication. Because most UTIs are caused by a bacterial infection, they usually can be treated with the use of antibiotics. The choice of antibiotic and length of treatment depend on your symptoms and the type of bacteria causing your infection. HOME CARE INSTRUCTIONS  If you were prescribed antibiotics, take them exactly as your caregiver instructs you. Finish the medication even if you feel better after you have only taken some of the medication.  Drink enough water and fluids to keep your urine clear or pale yellow.  Avoid caffeine, tea, and carbonated beverages. They tend to irritate your bladder.  Empty your bladder often. Avoid holding urine for long periods of time.  Empty your  bladder before and after sexual intercourse.  After a bowel movement, women should cleanse from front to back. Use each tissue only once. SEEK MEDICAL CARE IF:   You have back pain.  You develop a fever.  Your symptoms do not begin to resolve within 3 days. SEEK IMMEDIATE MEDICAL CARE IF:   You have severe back pain or lower abdominal pain.  You develop chills.  You have nausea or vomiting.  You have continued burning or discomfort with urination. MAKE SURE YOU:   Understand these instructions.  Will watch your condition.  Will get help right away if you are not doing well or get worse.   This information is not intended to replace advice given to you by your health care provider. Make sure you discuss any questions you have with your health care provider.   Document Released: 09/02/2005 Document Revised: 08/14/2015 Document Reviewed: 01/01/2012 Elsevier Interactive Patient Education 2016 [employer] Inc[employer]  Back Pain in Pregnancy Back pain during pregnancy is common. It happens in about half of all pregnancies. It is important for you and your baby that you remain active during your pregnancy.If you feel that back pain is not allowing you to remain active or sleep well, it is time to see your caregiver. Back pain may be caused by several factors related to changes during your pregnancy.Fortunately, unless you had trouble with your back before your pregnancy, the pain is likely to get better after you deliver. Low back pain usually occurs between the fifth and seventh months of pregnancy. It can, however, happen in the first couple months. Factors that increase the risk of back problems include:   Previous back problems.  Injury to your back.  Having twins or multiple births.  A chronic cough.  Stress.  Job-related repetitive motions.  Muscle or spinal disease in the back.  Family history of back problems, ruptured (herniated) discs, or osteoporosis.  Depression,  anxiety, and panic attacks. CAUSES   When you are pregnant, your body produces a hormone called relaxin. This hormonemakes the ligaments connecting the low back and pubic bones more flexible. This flexibility allows the baby to be  delivered more easily. When your ligaments are loose, your muscles need to work harder to support your back. Soreness in your back can come from tired muscles. Soreness can also come from back tissues that are irritated since they are receiving less support.  As the baby grows, it puts pressure on the nerves and blood vessels in your pelvis. This can cause back pain.  As the baby grows and gets heavier during pregnancy, the uterus pushes the stomach muscles forward and changes your center of gravity. This makes your back muscles work harder to maintain good posture. SYMPTOMS  Lumbar pain during pregnancy Lumbar pain during pregnancy usually occurs at or above the waist in the center of the back. There may be pain and numbness that radiates into your leg or foot. This is similar to low back pain experienced by non-pregnant women. It usually increases with sitting for long periods of time, standing, or repetitive lifting. Tenderness may also be present in the muscles along your upper back. Posterior pelvic pain during pregnancy Pain in the back of the pelvis is more common than lumbar pain in pregnancy. It is a deep pain felt in your side at the waistline, or across the tailbone (sacrum), or in both places. You may have pain on one or both sides. This pain can also go into the buttocks and backs of the upper thighs. Pubic and groin pain may also be present. The pain does not quickly resolve with rest, and morning stiffness may also be present. Pelvic pain during pregnancy can be brought on by most activities. A high level of fitness before and during pregnancy may or may not prevent this problem. Labor pain is usually 1 to 2 minutes apart, lasts for about 1 minute, and involves  a bearing down feeling or pressure in your pelvis. However, if you are at term with the pregnancy, constant low back pain can be the beginning of early labor, and you should be aware of this. DIAGNOSIS  X-rays of the back should not be done during the first 12 to 14 weeks of the pregnancy and only when absolutely necessary during the rest of the pregnancy. MRIs do not give off radiation and are safe during pregnancy. MRIs also should only be done when absolutely necessary. HOME CARE INSTRUCTIONS  Exercise as directed by your caregiver. Exercise is the most effective way to prevent or manage back pain. If you have a back problem, it is especially important to avoid sports that require sudden body movements. Swimming and walking are great activities.  Do not stand in one place for long periods of time.  Do not wear high heels.  Sit in chairs with good posture. Use a pillow on your lower back if necessary. Make sure your head rests over your shoulders and is not hanging forward.  Try sleeping on your side, preferably the left side, with a pillow or two between your legs. If you are sore after a night's rest, your bedmay betoo soft.Try placing a board between your mattress and box spring.  Listen to your body when lifting.If you are experiencing pain, ask for help or try bending yourknees more so you can use your leg muscles rather than your back muscles. Squat down when picking up something from the floor. Do not bend over.  Eat a healthy diet. Try to gain weight within your caregiver's recommendations.  Use heat or cold packs 3 to 4 times a day for 15 minutes to help with the pain.  Only take over-the-counter or prescription medicines for pain, discomfort, or fever as directed by your caregiver. Sudden (acute) back pain  Use bed rest for only the most extreme, acute episodes of back pain. Prolonged bed rest over 48 hours will aggravate your condition.  Ice is very effective for acute  conditions.  Put ice in a plastic bag.  Place a towel between your skin and the bag.  Leave the ice on for 10 to 20 minutes every 2 hours, or as needed.  Using heat packs for 30 minutes prior to activities is also helpful. Continued back pain See your caregiver if you have continued problems. Your caregiver can help or refer you for appropriate physical therapy. With conditioning, most back problems can be avoided. Sometimes, a more serious issue may be the cause of back pain. You should be seen right away if new problems seem to be developing. Your caregiver may recommend:  A maternity girdle.  An elastic sling.  A back brace.  A massage therapist or acupuncture. SEEK MEDICAL CARE IF:   You are not able to do most of your daily activities, even when taking the pain medicine you were given.  You need a referral to a physical therapist or chiropractor.  You want to try acupuncture. SEEK IMMEDIATE MEDICAL CARE IF:  You develop numbness, tingling, weakness, or problems with the use of your arms or legs.  You develop severe back pain that is no longer relieved with medicines.  You have a sudden change in bowel or bladder control.  You have increasing pain in other areas of the body.  You develop shortness of breath, dizziness, or fainting.  You develop nausea, vomiting, or sweating.  You have back pain which is similar to labor pains.  You have back pain along with your water breaking or vaginal bleeding.  You have back pain or numbness that travels down your leg.  Your back pain developed after you fell.  You develop pain on one side of your back. You may have a kidney stone.  You see blood in your urine. You may have a bladder infection or kidney stone.  You have back pain with blisters. You may have shingles. Back pain is fairly common during pregnancy but should not be accepted as just part of the process. Back pain should always be treated as soon as possible.  This will make your pregnancy as pleasant as possible.   This information is not intended to replace advice given to you by your health care provider. Make sure you discuss any questions you have with your health care provider.   Document Released: 03/03/2006 Document Revised: 02/15/2012 Document Reviewed: 04/14/2011 Elsevier Interactive Patient Education [employer]

## 2016-09-09 ENCOUNTER — Encounter (HOSPITAL_COMMUNITY): Payer: Self-pay | Admitting: Certified Nurse Midwife

## 2016-09-10 LAB — GC/CHLAMYDIA PROBE AMP (~~LOC~~) NOT AT ARMC
Chlamydia: NEGATIVE
Neisseria Gonorrhea: NEGATIVE

## 2016-09-10 LAB — CYTOLOGY - PAP

## 2016-09-12 LAB — THYROID PANEL
Free Thyroxine Index: 2.1 (ref 1.2–4.9)
T3 Uptake Ratio: 21 % — ABNORMAL LOW (ref 24–39)
T4, Total: 9.9 ug/dL (ref 4.5–12.0)

## 2016-09-12 LAB — CULTURE, OB URINE

## 2016-09-12 LAB — URINE CULTURE, OB REFLEX

## 2016-09-12 LAB — SPECIMEN STATUS REPORT

## 2016-09-14 ENCOUNTER — Other Ambulatory Visit: Payer: Self-pay | Admitting: Certified Nurse Midwife

## 2016-09-16 ENCOUNTER — Telehealth: Payer: Self-pay

## 2016-09-16 ENCOUNTER — Other Ambulatory Visit: Payer: Self-pay | Admitting: Certified Nurse Midwife

## 2016-09-16 DIAGNOSIS — R7989 Other specified abnormal findings of blood chemistry: Secondary | ICD-10-CM

## 2016-09-16 DIAGNOSIS — G894 Chronic pain syndrome: Secondary | ICD-10-CM | POA: Insufficient documentation

## 2016-09-16 LAB — MATERNIT GENOME: PDF: 0

## 2016-09-16 LAB — HEMOGLOBINOPATHY EVALUATION
HGB C: 0 %
HGB S: 0 %
Hemoglobin A2 Quantitation: 2.4 % (ref 0.7–3.1)
Hemoglobin F Quantitation: 0 % (ref 0.0–2.0)
Hgb A: 97.6 % (ref 94.0–98.0)

## 2016-09-16 LAB — PRENATAL PROFILE I(LABCORP)
Antibody Screen: NEGATIVE
Basophils Absolute: 0 10*3/uL (ref 0.0–0.2)
Basos: 1 %
EOS (ABSOLUTE): 0.1 10*3/uL (ref 0.0–0.4)
Eos: 1 %
Hematocrit: 36.8 % (ref 34.0–46.6)
Hemoglobin: 12.1 g/dL (ref 11.1–15.9)
Hepatitis B Surface Ag: NEGATIVE
Immature Grans (Abs): 0 10*3/uL (ref 0.0–0.1)
Immature Granulocytes: 0 %
Lymphocytes Absolute: 2.5 10*3/uL (ref 0.7–3.1)
Lymphs: 33 %
MCH: 28.6 pg (ref 26.6–33.0)
MCHC: 32.9 g/dL (ref 31.5–35.7)
MCV: 87 fL (ref 79–97)
Monocytes Absolute: 0.5 10*3/uL (ref 0.1–0.9)
Monocytes: 7 %
Neutrophils Absolute: 4.4 10*3/uL (ref 1.4–7.0)
Neutrophils: 58 %
Platelets: 274 10*3/uL (ref 150–379)
RBC: 4.23 x10E6/uL (ref 3.77–5.28)
RDW: 14.5 % (ref 12.3–15.4)
RPR Ser Ql: NONREACTIVE
Rh Factor: POSITIVE
Rubella Antibodies, IGG: 2.54 index (ref >0.99)
WBC: 7.6 10*3/uL (ref 3.4–10.8)

## 2016-09-16 LAB — HEMOGLOBIN A1C
Est. average glucose Bld gHb Est-mCnc: 103 mg/dL
Hgb A1c MFr Bld: 5.2 % (ref 4.8–5.6)

## 2016-09-16 LAB — TSH: TSH: 0.413 u[IU]/mL — ABNORMAL LOW (ref 0.450–4.500)

## 2016-09-16 LAB — TOXASSURE SELECT 13 (MW), URINE

## 2016-09-16 LAB — HIV ANTIBODY (ROUTINE TESTING W REFLEX): HIV Screen 4th Generation wRfx: NONREACTIVE

## 2016-09-16 LAB — VARICELLA ZOSTER ANTIBODY, IGG: Varicella zoster IgG: >4000 index (ref >165)

## 2016-09-16 MED ORDER — LABETALOL HCL 200 MG PO TABS: 200.0000 mg | ORAL_TABLET | Freq: Two times a day (BID) | ORAL | 3 refills | Status: DC

## 2016-09-16 NOTE — Progress Notes (Unsigned)
Sees pain management: has letter of approval for subutex for the end of pregnancy.  Has chronic back pain.  Discussed gabapentin use for chronic sciatica pain.  R.Denney CNM

## 2016-09-16 NOTE — Telephone Encounter (Signed)
Called and informed pt of lab results.  

## 2016-09-24 ENCOUNTER — Ambulatory Visit (HOSPITAL_COMMUNITY)
Admission: RE | Admit: 2016-09-24 | Discharge: 2016-09-24 | Disposition: A | Discharge status: Home / Self Care | Payer: Medicaid Other | Source: Ambulatory Visit | Attending: Certified Nurse Midwife | Admitting: Certified Nurse Midwife

## 2016-09-24 ENCOUNTER — Other Ambulatory Visit: Payer: Self-pay | Admitting: Certified Nurse Midwife

## 2016-09-24 ENCOUNTER — Telehealth: Payer: Self-pay | Admitting: Certified Nurse Midwife

## 2016-09-24 ENCOUNTER — Ambulatory Visit (HOSPITAL_COMMUNITY): Admission: RE | Admit: 2016-09-24 | Payer: Medicaid Other | Source: Ambulatory Visit

## 2016-09-24 ENCOUNTER — Encounter (HOSPITAL_COMMUNITY): Payer: Self-pay

## 2016-09-24 VITALS — BP 107/71 | HR 98 | Wt 239.6 lb

## 2016-09-24 DIAGNOSIS — O09521 Supervision of elderly multigravida, first trimester: Secondary | ICD-10-CM

## 2016-09-24 DIAGNOSIS — Z641 Problems related to multiparity: Secondary | ICD-10-CM

## 2016-09-24 DIAGNOSIS — O0991 Supervision of high risk pregnancy, unspecified, first trimester: Secondary | ICD-10-CM

## 2016-09-24 DIAGNOSIS — Z315 Encounter for genetic counseling: Secondary | ICD-10-CM | POA: Insufficient documentation

## 2016-09-24 DIAGNOSIS — Z3682 Encounter for antenatal screening for nuchal translucency: Secondary | ICD-10-CM | POA: Insufficient documentation

## 2016-09-24 DIAGNOSIS — M546 Pain in thoracic spine: Principal | ICD-10-CM

## 2016-09-24 DIAGNOSIS — Z3A13 13 weeks gestation of pregnancy: Secondary | ICD-10-CM

## 2016-09-24 DIAGNOSIS — O09529 Supervision of elderly multigravida, unspecified trimester: Secondary | ICD-10-CM

## 2016-09-24 DIAGNOSIS — G8929 Other chronic pain: Secondary | ICD-10-CM

## 2016-09-24 DIAGNOSIS — Z3A12 12 weeks gestation of pregnancy: Secondary | ICD-10-CM | POA: Insufficient documentation

## 2016-09-24 MED ORDER — GABAPENTIN 300 MG PO CAPS: 300.0000 mg | ORAL_CAPSULE | Freq: Three times a day (TID) | ORAL | 5 refills | Status: DC

## 2016-09-24 NOTE — Progress Notes (Signed)
Genetic Counseling  High-Risk Gestation Note  Appointment Date:  09/24/2016 Referred By: Shelly Lane, CNM Date of Birth:  [birth year] Partner:  Shelly Lane   Pregnancy History: [id number]: [id number] Estimated Date of Delivery: 04/03/17 Estimated Gestational Age: 905w5d Attending: Particia NearingMartha Decker, MD   Shelly Lane was seen for genetic counseling because of Lane maternal age of 40 y.o..     In summary:  Discussed increased risk for fetal aneuploidy with advanced maternal age  Reviewed results of patient's NIPS ([facility])   Discussed ultrasound as Lane screening tool  NT ultrasound performed today and within normal range  Anatomy ultrasound scheduled for 11/05/16  Discussed limitations of screening and option of diagnostic testing  Declined amniocentesis  Reviewed family history concerns  Reviewed associations with advanced paternal age  Discussed carrier screening options  CF-declined  SMA-declined  Hemoglobinopathies-hemoglobin electrophoresis through OB within normal range  She was counseled regarding maternal age and the association with risk for chromosome conditions due to nondisjunction with aging of the ova.   We reviewed chromosomes, nondisjunction, and the associated 1 in 3551 risk for fetal aneuploidy related to Lane maternal age of 40 y.o. at 265w5d gestation. She was counseled that the risk for aneuploidy decreases as gestational age increases, accounting for those pregnancies which spontaneously abort.  We specifically discussed Down syndrome (trisomy 7921), trisomies 3113 and 2518, and sex chromosome aneuploidies (47,XXX and 47,XXY) including the common features and prognoses of each.   We also reviewed the results of Shelly Lane's non-invasive prenatal screening (NIPS).  We discussed that NIPS analyzes placental cell free DNA in maternal circulation to evaluate for the presence of certain chromosome conditions.  Thus, it is able to provide risk assessment for  specific chromosome conditions, but is not diagnostic.  Specifically, Shelly Lane had [facility] through [facility]. This testing was screen negative for the conditions assessed including trisomies 21, 18, 13, and sex chromosome aneuploidy. Additionally, this screen was within normal limits for chromosome gains or losses of greater than 7 Mb and select microdeletion syndromes. We reviewed the methodology of NIPS and reviewed the sensitivity and specific for fetal aneuploidy. We discussed that the sensitivity is lower for other chromosome conditions and for microdeletion conditions. She understands that this is not diagnostic for the conditions screened.   We reviewed benefits and limitations of ultrasound as Lane screening tool for fetal aneuploidy. She was counseled that 50-80% of fetuses with Down syndrome and up to 90% of fetuses with trisomies 13 and 18, when well visualized, have detectable anomalies or soft markers by ultrasound.  Nuchal translucency ultrasound was performed today and was within normal range. Complete ultrasound results reported separately. Detailed ultrasound is scheduled for 11/05/16.   We also discussed the availability of diagnostic testing for chromosome conditions via amniocentesis.  We reviewed the risks, benefits and limitations of amniocentesis including the approximate 1 in 300-500 risk for pregnancy complications following amniocentesis. We discussed the possible results that the tests might provide including: positive, negative, unanticipated, and no result. Finally, they were counseled regarding the cost of each option and potential out of pocket expenses. After reviewing the above results and the available options, Shelly Lane declined amniocentesis, stating that she is comfortable with results of screening performed thus far (M[facility] and NT ultrasound).   Shelly Lane was provided with written information regarding cystic  fibrosis (CF), spinal muscular atrophy (SMA) and hemoglobinopathies including the carrier frequency, availability of carrier screening and prenatal  diagnosis if indicated.  In addition, we discussed that CF and hemoglobinopathies are routinely screened for as part of the [place] newborn screening panel. We discussed that hemoglobin electrophoresis was previously performed through her OB office and was within normal range. We also discussed that the couple's reported six children together theoretically reduce their risk to be Lane carrier couple for an autosomal recessive condition. After further discussion, she declined screening for CF and SMA.  Both family histories were reviewed and found to be contributory for postaxial polydactyly for the patient's niece (her maternal half-sister's daughter). The patient reported that the daughter's father (unrelated to the patient) also had postaxial polydactyly. Polydactyly is typically an isolated trait that can occur sporadically in Lane person or inherited as Lane familial trait.  When inherited as an isolated trait, autosomal dominant inheritance is observed, meaning each pregnancy of Lane parent with polydactyly has Lane 50% (1 in 2) chance to inherit this genetic predisposition for polydactyly.  Not all individuals that inherit this predisposition would be born with polydactyly, but they would still be at increased risk to have affected children.  Less commonly, polydactyly may be one feature of an underlying genetic condition that may or may not be inherited. Given the reported family history of apparently isolated polydactyly, recurrence risk to the current pregnancy is likely low. Targeted ultrasound is available to assess for polydactyly in the pregnancy. The patient understands that ultrasound cannot rule out all birth defects prenatally.  [name] reported Lane paternal cousin to the father of the pregnancy with congenital blindness. She had very limited information regarding  this relative, including the degree of relation to her husband. No additional relatives were reported with blindness or vision loss. We discussed that congenital blindness can be due to sporadic, genetic, multifactorial, or environmental causes. Given the reported family history and degree of relation, recurrence risk for the current pregnancy is likely low. Without further information regarding the provided family history, an accurate genetic risk cannot be calculated. Further genetic counseling is warranted if more information is obtained.  Ms. AJEENAH HEINY reported that the father of the pregnancy is 54 years old. We discussed that advanced paternal age (APA) is defined as paternal age greater than or equal to age 26.  Recent large-scale sequencing studies have shown that approximately 80% of de novo point mutations are of paternal origin.  Many studies have demonstrated Lane strong correlation between increased paternal age and de novo point mutations.  Although no specific data is available regarding fetal risks for fathers 19+ years old at conception, it is apparent that the overall risk for single gene conditions is increased.  To estimate the relative increase in risk of Lane genetic disorder with APA, the heritability of the disease must be considered.  Assuming an approximate 2x increase in risk for conditions that are exclusively paternal in origin, the risk for each individual condition is still relatively low.  It is estimated that the overall chance for Lane de novo mutation is ~0.5%.  We also discussed the wide range of conditions which can be caused by new dominant gene mutations (achondroplasia, neurofibromatosis, Marfan syndrome etc.).  She was counseled that genetic testing for each individual single gene condition is not warranted or available unless ultrasound or family history concerns lend suspicion to Lane specific condition.  We discussed the recommendation for Lane detailed ultrasound at 18+ weeks  gestation and Lane follow up ultrasound at ~28 weeks to monitor fetal growth.  Ms. MELENY TREGONING  denied exposure to environmental toxins or chemical agents. She denied the use of alcohol, tobacco or street drugs. She denied significant viral illnesses during the course of her pregnancy. Her medical and surgical histories were noncontributory.   I counseled Ms. JERILEE SPACE regarding the above risks and available options.  The approximate face-to-face time with the genetic counselor was 45 minutes.  Quinn Plowman, MS,  Certified Genetic Counselor 09/24/2016

## 2016-09-24 NOTE — Telephone Encounter (Signed)
Patient is requesting a prescription for Gabapentin - she states she was told that she could use it for her sciatica when she got to her 13th week and she will be there this weekend. She is in a lot of pain and would like to have it.

## 2016-09-24 NOTE — Telephone Encounter (Signed)
Please let her know that it has been sent to her pharmacy.  Please also let her know no to drive until she gets adjusted to the medication as it can make her sleepy.  Thank you. R.Denney CNM

## 2016-09-24 NOTE — Telephone Encounter (Signed)
Patient called wanting refill on pain med

## 2016-09-25 ENCOUNTER — Other Ambulatory Visit (HOSPITAL_COMMUNITY): Payer: Self-pay

## 2016-09-25 NOTE — Telephone Encounter (Signed)
Patient notified per VM 

## 2016-09-29 ENCOUNTER — Telehealth: Payer: Self-pay

## 2016-09-29 ENCOUNTER — Other Ambulatory Visit: Payer: Self-pay | Admitting: Certified Nurse Midwife

## 2016-09-29 DIAGNOSIS — O0992 Supervision of high risk pregnancy, unspecified, second trimester: Secondary | ICD-10-CM

## 2016-09-29 NOTE — Telephone Encounter (Signed)
[facility]. Per Rachelle patient needs to drop off urine sample to be sent out for CX.

## 2016-09-30 ENCOUNTER — Other Ambulatory Visit: Payer: Medicaid Other

## 2016-09-30 DIAGNOSIS — Z3490 Encounter for supervision of normal pregnancy, unspecified, unspecified trimester: Secondary | ICD-10-CM

## 2016-10-01 LAB — T4, FREE: Free T4: 1.01 ng/dL (ref 0.82–1.77)

## 2016-10-01 LAB — TSH: TSH: 1.25 u[IU]/mL (ref 0.450–4.500)

## 2016-10-07 ENCOUNTER — Ambulatory Visit (INDEPENDENT_AMBULATORY_CARE_PROVIDER_SITE_OTHER): Payer: Medicaid Other | Admitting: Obstetrics & Gynecology

## 2016-10-07 ENCOUNTER — Other Ambulatory Visit: Payer: Medicaid Other

## 2016-10-07 VITALS — BP 106/72 | HR 98

## 2016-10-07 DIAGNOSIS — Z8632 Personal history of gestational diabetes: Secondary | ICD-10-CM

## 2016-10-07 DIAGNOSIS — B3731 Acute candidiasis of vulva and vagina: Secondary | ICD-10-CM

## 2016-10-07 DIAGNOSIS — B373 Candidiasis of vulva and vagina: Secondary | ICD-10-CM

## 2016-10-07 DIAGNOSIS — O98811 Other maternal infectious and parasitic diseases complicating pregnancy, first trimester: Secondary | ICD-10-CM

## 2016-10-07 DIAGNOSIS — O0990 Supervision of high risk pregnancy, unspecified, unspecified trimester: Secondary | ICD-10-CM

## 2016-10-07 MED ORDER — TERCONAZOLE 0.4 % VA CREA: 1.0000 | TOPICAL_CREAM | Freq: Every day | VAGINAL | 0 refills | Status: DC

## 2016-10-07 MED ORDER — PRENATE STAR 20-1 MG PO TABS: 1.0000 | ORAL_TABLET | Freq: Every day | ORAL | 11 refills | Status: DC

## 2016-10-07 NOTE — Addendum Note (Signed)
Addended by: Marya LandryFOSTER, Maela Takeda D on: 10/07/2016 11:33 AM   Modules accepted: Orders

## 2016-10-07 NOTE — Progress Notes (Signed)
   PRENATAL VISIT NOTE  Subjective:  Griselda MinerLinda E Spangle is a 40 y.o. [id number] at 5675w4d being seen today for ongoing prenatal care.  She is currently monitored for the following issues for this high-risk pregnancy and has OBESITY; ANXIETY; BULIMIA; DEPRESSION; SYMPTOM, PAIN, ABDOMINAL, EPIGASTRIC; Elderly multigravida with antepartum condition or complication; SAB (spontaneous abortion) x 4; Grand multipara; Hx of preterm delivery x 2, currently pregnant; Hx gestational diabetes x 2; Multiple drug allergies; History of abnormal cervical Pap smear; Supervision of high-risk pregnancy; and Chronic pain disorder on her problem list.  Patient reports vaginal irritation, thinks she has yeast infection. She had this before, desires treatment.  Contractions: Not present. Vag. Bleeding: None.   . Denies leaking of fluid.   The following portions of the patient's history were reviewed and updated as appropriate: allergies, current medications, past family history, past medical history, past social history, past surgical history and problem list. Problem list updated.  Objective:   Vitals:   10/07/16 0905  BP: 106/72  Pulse: 98    Fetal Status: Fetal Heart Rate (bpm): 145         General:  Alert, oriented and cooperative. Patient is in no acute distress.  Skin: Skin is warm and dry. No rash noted.   Cardiovascular: Normal heart rate noted  Respiratory: Normal respiratory effort, no problems with respiration noted  Abdomen: Soft, gravid, appropriate for gestational age. Pain/Pressure: Absent     Pelvic:  Cervical exam deferred        Extremities: Normal range of motion.     Mental Status: Normal mood and affect. Normal behavior. Normal judgment and thought content.   Assessment and Plan:  Pregnancy: [id number] at 1975w4d  1. Hx gestational diabetes x 2 - Glucose Tolerance, 2 Hours w/1 Hour done today, will follow up results and manage accordingly.  2. Vaginal yeast infection - terconazole (TERAZOL  7) 0.4 % vaginal cream; Place 1 applicator vaginally at bedtime. Use for seven days  Dispense: 45 g; Refill: 0  3. Supervision of high risk pregnancy, antepartum Already scheduled for anatomy scan. Normal NIPS. Routine obstetric precautions reviewed. Please refer to After Visit Summary for other counseling recommendations.  Return in about 4 weeks (around 11/04/2016) for OB Visit.  Tereso NewcomerUgonna A Jailan Trimm, MD

## 2016-10-07 NOTE — Patient Instructions (Signed)
Return to clinic for any scheduled appointments or obstetric concerns, or go to [facility] for evaluation  

## 2016-10-08 LAB — GLUCOSE TOLERANCE, 2 HOURS W/ 1HR
Glucose, 1 hour: 134 mg/dL (ref 65–179)
Glucose, 2 hour: 51 mg/dL — ABNORMAL LOW (ref 65–152)
Glucose, Fasting: 77 mg/dL (ref 65–91)

## 2016-11-04 ENCOUNTER — Other Ambulatory Visit: Payer: Self-pay | Admitting: Obstetrics & Gynecology

## 2016-11-04 ENCOUNTER — Other Ambulatory Visit: Payer: Self-pay | Admitting: Certified Nurse Midwife

## 2016-11-04 DIAGNOSIS — B373 Candidiasis of vulva and vagina: Secondary | ICD-10-CM

## 2016-11-04 DIAGNOSIS — O2341 Unspecified infection of urinary tract in pregnancy, first trimester: Secondary | ICD-10-CM

## 2016-11-04 DIAGNOSIS — B3731 Acute candidiasis of vulva and vagina: Secondary | ICD-10-CM

## 2016-11-05 ENCOUNTER — Ambulatory Visit (HOSPITAL_COMMUNITY): Payer: Medicaid Other

## 2016-11-05 ENCOUNTER — Encounter (HOSPITAL_COMMUNITY): Payer: Self-pay

## 2016-11-05 ENCOUNTER — Ambulatory Visit (HOSPITAL_COMMUNITY)
Admission: RE | Admit: 2016-11-05 | Discharge: 2016-11-05 | Disposition: A | Discharge status: Home / Self Care | Payer: Medicaid Other | Source: Ambulatory Visit | Attending: Certified Nurse Midwife | Admitting: Certified Nurse Midwife

## 2016-11-05 ENCOUNTER — Other Ambulatory Visit: Payer: Self-pay | Admitting: Certified Nurse Midwife

## 2016-11-05 ENCOUNTER — Encounter: Payer: Medicaid Other | Admitting: Certified Nurse Midwife

## 2016-11-05 ENCOUNTER — Other Ambulatory Visit: Payer: Self-pay | Admitting: Obstetrics & Gynecology

## 2016-11-05 DIAGNOSIS — B3731 Acute candidiasis of vulva and vagina: Secondary | ICD-10-CM

## 2016-11-05 DIAGNOSIS — Z3A18 18 weeks gestation of pregnancy: Secondary | ICD-10-CM | POA: Insufficient documentation

## 2016-11-05 DIAGNOSIS — O2341 Unspecified infection of urinary tract in pregnancy, first trimester: Secondary | ICD-10-CM

## 2016-11-05 DIAGNOSIS — O09529 Supervision of elderly multigravida, unspecified trimester: Secondary | ICD-10-CM | POA: Diagnosis not present

## 2016-11-05 DIAGNOSIS — B373 Candidiasis of vulva and vagina: Secondary | ICD-10-CM

## 2016-11-09 ENCOUNTER — Other Ambulatory Visit (HOSPITAL_COMMUNITY): Payer: Self-pay | Admitting: *Deleted

## 2016-11-09 DIAGNOSIS — O09522 Supervision of elderly multigravida, second trimester: Secondary | ICD-10-CM

## 2016-11-09 MED ORDER — TERCONAZOLE 0.4 % VA CREA: 1.0000 | TOPICAL_CREAM | Freq: Every day | VAGINAL | 0 refills | Status: DC

## 2016-11-09 MED ORDER — NITROFURANTOIN MONOHYD MACRO 100 MG PO CAPS: 100.0000 mg | ORAL_CAPSULE | Freq: Two times a day (BID) | ORAL | 0 refills | Status: DC

## 2016-11-23 ENCOUNTER — Telehealth: Payer: Self-pay

## 2016-11-23 NOTE — Telephone Encounter (Signed)
Spoke with patient and advised of rx sent by provider.

## 2016-11-26 ENCOUNTER — Ambulatory Visit (INDEPENDENT_AMBULATORY_CARE_PROVIDER_SITE_OTHER): Payer: Medicaid Other | Admitting: Certified Nurse Midwife

## 2016-11-26 VITALS — BP 96/66 | HR 109 | Wt 256.0 lb

## 2016-11-26 DIAGNOSIS — O09529 Supervision of elderly multigravida, unspecified trimester: Secondary | ICD-10-CM

## 2016-11-26 DIAGNOSIS — O09522 Supervision of elderly multigravida, second trimester: Secondary | ICD-10-CM

## 2016-11-26 DIAGNOSIS — O09219 Supervision of pregnancy with history of pre-term labor, unspecified trimester: Secondary | ICD-10-CM

## 2016-11-26 DIAGNOSIS — N898 Other specified noninflammatory disorders of vagina: Secondary | ICD-10-CM

## 2016-11-26 DIAGNOSIS — O09212 Supervision of pregnancy with history of pre-term labor, second trimester: Secondary | ICD-10-CM

## 2016-11-26 DIAGNOSIS — Z641 Problems related to multiparity: Secondary | ICD-10-CM

## 2016-11-26 DIAGNOSIS — F1910 Other psychoactive substance abuse, uncomplicated: Secondary | ICD-10-CM

## 2016-11-26 DIAGNOSIS — O0990 Supervision of high risk pregnancy, unspecified, unspecified trimester: Secondary | ICD-10-CM

## 2016-11-26 DIAGNOSIS — G894 Chronic pain syndrome: Secondary | ICD-10-CM

## 2016-11-26 DIAGNOSIS — Z8632 Personal history of gestational diabetes: Secondary | ICD-10-CM

## 2016-11-26 DIAGNOSIS — O9989 Other specified diseases and conditions complicating pregnancy, childbirth and the puerperium: Secondary | ICD-10-CM

## 2016-11-26 DIAGNOSIS — O09899 Supervision of other high risk pregnancies, unspecified trimester: Secondary | ICD-10-CM

## 2016-11-26 DIAGNOSIS — O039 Complete or unspecified spontaneous abortion without complication: Secondary | ICD-10-CM

## 2016-11-26 LAB — POCT URINALYSIS DIPSTICK
Bilirubin, UA: NEGATIVE
Blood, UA: NEGATIVE
Glucose, UA: NEGATIVE
Ketones, UA: NEGATIVE
Nitrite, UA: NEGATIVE
Spec Grav, UA: 1.015
Urobilinogen, UA: 0.2
pH, UA: 6

## 2016-11-26 MED ORDER — VITAFOL-NANO 18-0.6-0.4 MG PO TABS: 1.0000 | ORAL_TABLET | Freq: Every day | ORAL | 12 refills | Status: DC

## 2016-11-26 NOTE — Progress Notes (Signed)
Pt c/o vaginal irritation and burning x few weeks.

## 2016-11-26 NOTE — Patient Instructions (Addendum)
Second Trimester of Pregnancy The second trimester is from week 13 through week 28 (months 4 through 6). The second trimester is often a time when you feel your best. Your body has also adjusted to being pregnant, and you begin to feel better physically. Usually, morning sickness has lessened or quit completely, you may have more energy, and you may have an increase in appetite. The second trimester is also a time when the fetus is growing rapidly. At the end of the sixth month, the fetus is about 9 inches long and weighs about 1 pounds. You will likely begin to feel the baby move (quickening) between 18 and 20 weeks of the pregnancy. Body changes during your second trimester Your body continues to go through many changes during your second trimester. The changes vary from woman to woman.  Your weight will continue to increase. You will notice your lower abdomen bulging out.  You may begin to get stretch marks on your hips, abdomen, and breasts.  You may develop headaches that can be relieved by medicines. The medicines should be approved by your health care provider.  You may urinate more often because the fetus is pressing on your bladder.  You may develop or continue to have heartburn as a result of your pregnancy.  You may develop constipation because certain hormones are causing the muscles that push waste through your intestines to slow down.  You may develop hemorrhoids or swollen, bulging veins (varicose veins).  You may have back pain. This is caused by:  Weight gain.  Pregnancy hormones that are relaxing the joints in your pelvis.  A shift in weight and the muscles that support your balance.  Your breasts will continue to grow and they will continue to become tender.  Your gums may bleed and may be sensitive to brushing and flossing.  Dark spots or blotches (chloasma, mask of pregnancy) may develop on your face. This will likely fade after the baby is born.  A dark line  from your belly button to the pubic area (linea nigra) may appear. This will likely fade after the baby is born.  You may have changes in your hair. These can include thickening of your hair, rapid growth, and changes in texture. Some women also have hair loss during or after pregnancy, or hair that feels dry or thin. Your hair will most likely return to normal after your baby is born. What to expect at prenatal visits During a routine prenatal visit:  You will be weighed to make sure you and the fetus are growing normally.  Your blood pressure will be taken.  Your abdomen will be measured to track your baby's growth.  The fetal heartbeat will be listened to.  Any test results from the previous visit will be discussed. Your health care provider may ask you:  How you are feeling.  If you are feeling the baby move.  If you have had any abnormal symptoms, such as leaking fluid, bleeding, severe headaches, or abdominal cramping.  If you are using any tobacco products, including cigarettes, chewing tobacco, and electronic cigarettes.  If you have any questions. Other tests that may be performed during your second trimester include:  Blood tests that check for:  Low iron levels (anemia).  Gestational diabetes (between 24 and 28 weeks).  Rh antibodies. This is to check for a protein on red blood cells (Rh factor).  Urine tests to check for infections, diabetes, or protein in the urine.  An ultrasound to   confirm the proper growth and development of the baby.  An amniocentesis to check for possible genetic problems.  Fetal screens for spina bifida and Down syndrome.  HIV (human immunodeficiency virus) testing. Routine prenatal testing includes screening for HIV, unless you choose not to have this test. Follow these instructions at home: Eating and drinking  Continue to eat regular, healthy meals.  Avoid raw meat, uncooked cheese, cat litter boxes, and soil used by cats. These  carry germs that can cause birth defects in the baby.  Take your prenatal vitamins.  Take 1500-2000 mg of calcium daily starting at the 20th week of pregnancy until you deliver your baby.  If you develop constipation:  Take over-the-counter or prescription medicines.  Drink enough fluid to keep your urine clear or pale yellow.  Eat foods that are high in fiber, such as fresh fruits and vegetables, whole grains, and beans.  Limit foods that are high in fat and processed sugars, such as fried and sweet foods. Activity  Exercise only as directed by your health care provider. Experiencing uterine cramps is a good sign to stop exercising.  Avoid heavy lifting, wear low heel shoes, and practice good posture.  Wear your seat belt at all times when driving.  Rest with your legs elevated if you have leg cramps or low back pain.  Wear a good support bra for breast tenderness.  Do not use hot tubs, steam rooms, or saunas. Lifestyle  Avoid all smoking, herbs, alcohol, and unprescribed drugs. These chemicals affect the formation and growth of the baby.  Do not use any products that contain nicotine or tobacco, such as cigarettes and e-cigarettes. If you need help quitting, ask your health care provider.  A sexual relationship may be continued unless your health care provider directs you otherwise. General instructions  Follow your health care provider's instructions regarding medicine use. There are medicines that are either safe or unsafe to take during pregnancy.  Take warm sitz baths to soothe any pain or discomfort caused by hemorrhoids. Use hemorrhoid cream if your health care provider approves.  If you develop varicose veins, wear support hose. Elevate your feet for 15 minutes, 3-4 times a day. Limit salt in your diet.  Visit your dentist if you have not gone yet during your pregnancy. Use a soft toothbrush to brush your teeth and be gentle when you floss.  Keep all follow-up  prenatal visits as told by your health care provider. This is important. Contact a health care provider if:  You have dizziness.  You have mild pelvic cramps, pelvic pressure, or nagging pain in the abdominal area.  You have persistent nausea, vomiting, or diarrhea.  You have a bad smelling vaginal discharge.  You have pain with urination. Get help right away if:  You have a fever.  You are leaking fluid from your vagina.  You have spotting or bleeding from your vagina.  You have severe abdominal cramping or pain.  You have rapid weight gain or weight loss.  You have shortness of breath with chest pain.  You notice sudden or extreme swelling of your face, hands, ankles, feet, or legs.  You have not felt your baby move in over an hour.  You have severe headaches that do not go away with medicine.  You have vision changes. Summary  The second trimester is from week 13 through week 28 (months 4 through 6). It is also a time when the fetus is growing rapidly.  Your body goes   through many changes during pregnancy. The changes vary from woman to woman.  Avoid all smoking, herbs, alcohol, and unprescribed drugs. These chemicals affect the formation and growth your baby.  Do not use any tobacco products, such as cigarettes, chewing tobacco, and e-cigarettes. If you need help quitting, ask your health care provider.  Contact your health care provider if you have any questions. Keep all prenatal visits as told by your health care provider. This is important. This information is not intended to replace advice given to you by your health care provider. Make sure you discuss any questions you have with your health care provider. Document Released: 11/17/2001 Document Revised: 04/30/2016 Document Reviewed: 01/24/2013 Elsevier Interactive Patient Education  2017 Elsevier Inc.  

## 2016-11-26 NOTE — Addendum Note (Signed)
Addended by: Dalphine HandingGARDNER, TYVONA L on: 11/26/2016 04:57 PM   Modules accepted: Orders

## 2016-11-26 NOTE — Progress Notes (Signed)
Subjective:    Shelly Lane is a 40 y.o. female being seen today for her obstetrical visit. She is at 5965w5d gestation. Patient reports: no bleeding, no contractions, no cramping, no leaking, vaginal irritation and constipation . Fetal movement: normal.  Has f/u MFM scheduled for 12/17/16  Problem List Items Addressed This Visit      Other   Elderly multigravida with antepartum condition or complication (Chronic)   Grand multipara (Chronic)   Hx gestational diabetes x 2 (Chronic)   Supervision of high-risk pregnancy - Primary (Chronic)   Relevant Medications   Prenatal-Fe Fum-Methf-FA w/o A (VITAFOL-NANO) 18-0.6-0.4 MG TABS   Other Relevant Orders   Thyroid Panel With TSH   NuSwab Vaginitis Plus (VG+)   SAB (spontaneous abortion) x 4   Hx of preterm delivery x 2, currently pregnant   Chronic pain disorder   Drug abuse    Other Visit Diagnoses    Vaginal irritation       Relevant Orders   POCT urinalysis dipstick (Completed)   NuSwab Vaginitis Plus (VG+)     Patient Active Problem List   Diagnosis Date Noted  . Drug abuse 11/26/2016  . Chronic pain disorder 09/16/2016  . Supervision of high-risk pregnancy 09/08/2016  . History of abnormal cervical Pap smear 10/04/2013  . Elderly multigravida with antepartum condition or complication 02/13/2013  . SAB (spontaneous abortion) x 4 02/13/2013  . Grand multipara 02/13/2013  . Hx of preterm delivery x 2, currently pregnant 02/13/2013  . Hx gestational diabetes x 2 02/13/2013  . Multiple drug allergies 02/13/2013  . BULIMIA 10/21/2007  . ANXIETY 08/02/2007  . SYMPTOM, PAIN, ABDOMINAL, EPIGASTRIC 08/02/2007  . OBESITY 08/01/2007  . DEPRESSION 08/01/2007   Objective:    BP 96/66   Pulse (!) 109   Wt 256 lb (116.1 kg)   LMP  (LMP Unknown)   BMI 40.10 kg/m  FHT: 145 BPM  Uterine Size: 21 cm and size equals dates     Assessment:    Pregnancy @ 5865w5d    pelvic pain in pregnancy  constipation: OTC miralax   Vaginal  irritation ?hemorrhoids  Plan:   Rx: mother to be abdominal support belt  OBGCT: discussed. Signs and symptoms of preterm labor: discussed.  Labs, problem list reviewed and updated 2 hr GTT planned Follow up in 4 weeks.

## 2016-12-01 ENCOUNTER — Other Ambulatory Visit: Payer: Self-pay | Admitting: Certified Nurse Midwife

## 2016-12-01 DIAGNOSIS — B373 Candidiasis of vulva and vagina: Secondary | ICD-10-CM

## 2016-12-01 DIAGNOSIS — B3731 Acute candidiasis of vulva and vagina: Secondary | ICD-10-CM

## 2016-12-01 LAB — NUSWAB VAGINITIS PLUS (VG+)
Candida albicans, NAA: POSITIVE — AB
Candida glabrata, NAA: NEGATIVE
Chlamydia trachomatis, NAA: NEGATIVE
Neisseria gonorrhoeae, NAA: NEGATIVE
Trich vag by NAA: NEGATIVE

## 2016-12-01 MED ORDER — FLUCONAZOLE 150 MG PO TABS: 150.0000 mg | ORAL_TABLET | Freq: Once | ORAL | 0 refills | Status: AC

## 2016-12-01 MED ORDER — TERCONAZOLE 0.8 % VA CREA: 1.0000 | TOPICAL_CREAM | Freq: Every day | VAGINAL | 0 refills | Status: DC

## 2016-12-07 NOTE — L&D Delivery Note (Signed)
41 y.o. [id number] at [redacted]w[redacted]d on hands and knees, delivered a viable female infant in cephalic, LOA position. Tight nuchal cord x1, delivered through with ease and reduced after delivery. Right anterior shoulder delivered with ease. 60 sec delayed cord clamping. Cord clamped x2 and cut. Placenta delivered spontaneously intact, with 3VC. Fundus firm on exam with massage and pitocin. Good hemostasis noted.  Anesthesia: Local anesthesia for repair Laceration: 1st degree perineal Suture: 4-0 Monocryl Good hemostasis noted. EBL: 200cc  Mom and baby recovering in LDR.    Apgars: APGAR (1 MIN): 8   APGAR (5 MINS): 9     Weight: Pending skin to skin    Jen Mow, DO [facility] [employer], [facility] 03/27/2017, 9:35 AM

## 2016-12-17 ENCOUNTER — Encounter (HOSPITAL_COMMUNITY): Payer: Self-pay

## 2016-12-17 ENCOUNTER — Ambulatory Visit (HOSPITAL_COMMUNITY)
Admission: RE | Admit: 2016-12-17 | Discharge: 2016-12-17 | Disposition: A | Discharge status: Home / Self Care | Payer: Medicaid Other | Source: Ambulatory Visit | Attending: Certified Nurse Midwife | Admitting: Certified Nurse Midwife

## 2016-12-23 ENCOUNTER — Encounter: Payer: Medicaid Other | Admitting: Certified Nurse Midwife

## 2016-12-26 ENCOUNTER — Inpatient Hospital Stay (HOSPITAL_COMMUNITY)
Admission: AD | Admit: 2016-12-26 | Discharge: 2016-12-26 | Disposition: A | Discharge status: Home / Self Care | Payer: Medicaid Other | Source: Ambulatory Visit | Attending: Obstetrics and Gynecology | Admitting: Obstetrics and Gynecology

## 2016-12-26 ENCOUNTER — Encounter (HOSPITAL_COMMUNITY): Payer: Self-pay | Admitting: *Deleted

## 2016-12-26 DIAGNOSIS — R42 Dizziness and giddiness: Secondary | ICD-10-CM | POA: Insufficient documentation

## 2016-12-26 DIAGNOSIS — Z3A26 26 weeks gestation of pregnancy: Secondary | ICD-10-CM | POA: Insufficient documentation

## 2016-12-26 DIAGNOSIS — O479 False labor, unspecified: Secondary | ICD-10-CM | POA: Diagnosis not present

## 2016-12-26 DIAGNOSIS — O471 False labor at or after 37 completed weeks of gestation: Secondary | ICD-10-CM | POA: Diagnosis not present

## 2016-12-26 LAB — URINALYSIS, ROUTINE W REFLEX MICROSCOPIC
Bilirubin Urine: NEGATIVE
Glucose, UA: NEGATIVE mg/dL
Hgb urine dipstick: NEGATIVE
Ketones, ur: NEGATIVE mg/dL
Nitrite: POSITIVE — AB
Protein, ur: NEGATIVE mg/dL
Specific Gravity, Urine: 1.02 (ref 1.005–1.030)
pH: 6 (ref 5.0–8.0)

## 2016-12-26 NOTE — Progress Notes (Signed)
Pt unaware of ctxs. Baby active and pt sat up due to being uncomfortable in bed. Transducer removed

## 2016-12-26 NOTE — Progress Notes (Signed)
Dr Myrtie SomanWarden on unit and aware of pt's admission and status. WIll see pt

## 2016-12-26 NOTE — MAU Note (Signed)
Having contractions since TUesday off an on. Missed appt on Weds due to weather. Couldn't get out of driveway until today. Feel hot and then get dizzy and have passed out twice yesterday but did not hurt myself. If I sit down I feel better. Some pelvic pressure. Denies LOF or bleeding

## 2016-12-26 NOTE — Progress Notes (Signed)
Freddie ApleyKathrine Kooistra CNM in to discuss d/c plan with pt. Written and verbal d/c instructions given and understanding voiced

## 2016-12-26 NOTE — Progress Notes (Signed)
Transducer adj. Pt playing game on phone

## 2016-12-26 NOTE — Discharge Instructions (Signed)
Braxton Hicks Contractions °Contractions of the uterus can occur throughout pregnancy. Contractions are not always a sign that you are in labor.  °WHAT ARE BRAXTON HICKS CONTRACTIONS?  °Contractions that occur before labor are called Braxton Hicks contractions, or false labor. Toward the end of pregnancy (32-34 weeks), these contractions can develop more often and may become more forceful. This is not true labor because these contractions do not result in opening (dilatation) and thinning of the cervix. They are sometimes difficult to tell apart from true labor because these contractions can be forceful and people have different pain tolerances. You should not feel embarrassed if you go to the hospital with false labor. Sometimes, the only way to tell if you are in true labor is for your health care provider to look for changes in the cervix. °If there are no prenatal problems or other health problems associated with the pregnancy, it is completely safe to be sent home with false labor and await the onset of true labor. °HOW CAN YOU TELL THE DIFFERENCE BETWEEN TRUE AND FALSE LABOR? °False Labor  °· The contractions of false labor are usually shorter and not as hard as those of true labor.   °· The contractions are usually irregular.   °· The contractions are often felt in the front of the lower abdomen and in the groin.   °· The contractions may go away when you walk around or change positions while lying down.   °· The contractions get weaker and are shorter lasting as time goes on.   °· The contractions do not usually become progressively stronger, regular, and closer together as with true labor.   °True Labor  °· Contractions in true labor last 30-70 seconds, become very regular, usually become more intense, and increase in frequency.   °· The contractions do not go away with walking.   °· The discomfort is usually felt in the top of the uterus and spreads to the lower abdomen and low back.   °· True labor can be  determined by your health care provider with an exam. This will show that the cervix is dilating and getting thinner.   °WHAT TO REMEMBER °· Keep up with your usual exercises and follow other instructions given by your health care provider.   °· Take medicines as directed by your health care provider.   °· Keep your regular prenatal appointments.   °· Eat and drink lightly if you think you are going into labor.   °· If Braxton Hicks contractions are making you uncomfortable:   °¨ Change your position from lying down or resting to walking, or from walking to resting.   °¨ Sit and rest in a tub of warm water.   °¨ Drink 2-3 glasses of water. Dehydration may cause these contractions.   °¨ Do slow and deep breathing several times an hour.   °WHEN SHOULD I SEEK IMMEDIATE MEDICAL CARE? °Seek immediate medical care if: °· Your contractions become stronger, more regular, and closer together.   °· You have fluid leaking or gushing from your vagina.   °· You have a fever.   °· You pass blood-tinged mucus.   °· You have vaginal bleeding.   °· You have continuous abdominal pain.   °· You have low back pain that you never had before.   °· You feel your baby's head pushing down and causing pelvic pressure.   °· Your baby is not moving as much as it used to.   °This information is not intended to replace advice given to you by your health care provider. Make sure you discuss any questions you have with your health care   provider. °Document Released: 11/23/2005 Document Revised: 03/16/2016 Document Reviewed: 09/04/2013 °Elsevier Interactive Patient Education © 2017 Elsevier Inc. ° °
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
## 2016-12-26 NOTE — Progress Notes (Signed)
K. Kooistra CNM reminded about pt and will see pt when finishes in BS

## 2016-12-28 ENCOUNTER — Telehealth: Payer: Self-pay | Admitting: *Deleted

## 2016-12-28 ENCOUNTER — Encounter: Payer: Medicaid Other | Admitting: Obstetrics and Gynecology

## 2016-12-28 LAB — CULTURE, OB URINE: Culture: >=100000 — AB

## 2016-12-28 NOTE — Telephone Encounter (Signed)
Patient returned call to office at 3:15pm.  She stated she did not get my message.  She stated she cannot come in that early, she works third shift and is sleeping.  Unable to get a female provider for her this week.  Had appointments available for her this week, but they were not with female provider. Patient scheduled according to her requests for female provider and PM appointment for next Monday 01/04/17.

## 2016-12-28 NOTE — Telephone Encounter (Signed)
Telephone call to patient.  Patient was seen at hospital over the weekend and they want her to follow up today or tomorrow in the office.  Left message for patient that I have added her on for 1:30pm today.  Will try back later to confirm she got the message.

## 2016-12-29 ENCOUNTER — Telehealth: Payer: Self-pay | Admitting: Student

## 2016-12-29 DIAGNOSIS — O2342 Unspecified infection of urinary tract in pregnancy, second trimester: Secondary | ICD-10-CM

## 2016-12-29 MED ORDER — NITROFURANTOIN MONOHYD MACRO 100 MG PO CAPS: 100.0000 mg | ORAL_CAPSULE | Freq: Two times a day (BID) | ORAL | 0 refills | Status: DC

## 2016-12-29 NOTE — Telephone Encounter (Signed)
Verified identity by name & DOB. Notified of + urine culture. Will send rx for macrobid to pharmacy on file. Questions answered.

## 2016-12-29 NOTE — Telephone Encounter (Signed)
Left voicemail for return phone call. Pt needs to be notified of + urine culture & needs tx.

## 2017-01-04 ENCOUNTER — Ambulatory Visit (INDEPENDENT_AMBULATORY_CARE_PROVIDER_SITE_OTHER): Payer: Medicaid Other | Admitting: Obstetrics and Gynecology

## 2017-01-04 VITALS — BP 114/75 | HR 103 | Wt 261.1 lb

## 2017-01-04 DIAGNOSIS — Z23 Encounter for immunization: Secondary | ICD-10-CM

## 2017-01-04 DIAGNOSIS — Z8632 Personal history of gestational diabetes: Secondary | ICD-10-CM

## 2017-01-04 DIAGNOSIS — O09529 Supervision of elderly multigravida, unspecified trimester: Secondary | ICD-10-CM

## 2017-01-04 DIAGNOSIS — O09522 Supervision of elderly multigravida, second trimester: Secondary | ICD-10-CM

## 2017-01-04 DIAGNOSIS — Z641 Problems related to multiparity: Secondary | ICD-10-CM

## 2017-01-04 DIAGNOSIS — O0990 Supervision of high risk pregnancy, unspecified, unspecified trimester: Secondary | ICD-10-CM

## 2017-01-04 NOTE — Addendum Note (Signed)
Addended by: Natale MilchSTALLING, Uzair Godley D on: 01/04/2017 03:24 PM   Modules accepted: Orders

## 2017-01-04 NOTE — Patient Instructions (Signed)
Thinking About Waterbirth???  You must attend a Waterbirth class at [facility]  3rd Wednesday of every month from 7-9pm  Free  Register by calling [phone] or online at [email or web address]  Bring us the certificate from the class  Waterbirth supplies needed for [facility] Department patients:  Our practice has a Birth Pool in a Box tub at the hospital that you can borrow  You will need to purchase an accessory kit that has all needed supplies through [facility] ([phone]) or online $175.00  Or you can purchase the supplies separately: o Single-use disposable tub liner for Birth Pool in a Box (REGULAR size) o New garden hose labeled "lead-free", "suitable for drinking water", o Electric drain pump to remove water (We recommend 792 gallon per hour or greater pump.)  o  "non-toxic" OR "water potable" o Garden hose to remove the dirty water o Fish net o Bathing suit top (optional) o Long-handled mirror (optional)  [facility]com sells tubs for ~ $120 if you would rather purchase your own tub.  They also sell accessories, liners.    Www.waterbirthsolutions.com for tub purchases and supplies  [employer] (www.thelaborladies.com) $275 for tub rental/set-up & take down/kit   [place] [employer] information regarding doulas (labor support) who provide pool rentals:  Http://www.padanc.org/MeetUs.htm   [employer] (www.thelaborladies.com)  Http://www.padanc.org/MeetUs.htm   Things that would prevent you from having a waterbirth:  Premature, <37wks  Previous cesarean birth  Presence of thick meconium-stained fluid  Multiple gestation (Twins, triplets, etc.)  Uncontrolled diabetes or gestational diabetes requiring medication  Hypertension  Heavy vaginal bleeding  Non-reassuring fetal heart rate  Active infection (MRSA, etc.)  If your labor has to be induced and induction method requires continuous  monitoring of the baby's heart rate  Other risks/issues identified by your obstetrical provider   

## 2017-01-04 NOTE — Progress Notes (Signed)
Patient is in the office, reports good fetal movement. 

## 2017-01-04 NOTE — Progress Notes (Signed)
   PRENATAL VISIT NOTE  Subjective:  Shelly Lane is a 41 y.o. [id number] at 3749w2d being seen today for ongoing prenatal care.  She is currently monitored for the following issues for this high-risk pregnancy and has OBESITY; ANXIETY; BULIMIA; DEPRESSION; SYMPTOM, PAIN, ABDOMINAL, EPIGASTRIC; Elderly multigravida with antepartum condition or complication; SAB (spontaneous abortion) x 4; Grand multipara; Hx of preterm delivery x 2, currently pregnant; Hx gestational diabetes x 2; Multiple drug allergies; History of abnormal cervical Pap smear; Supervision of high-risk pregnancy; Chronic pain disorder; and Drug abuse on her problem list.  Patient reports no complaints.  Contractions: Not present. Vag. Bleeding: None.  Movement: Present. Denies leaking of fluid.   The following portions of the patient's history were reviewed and updated as appropriate: allergies, current medications, past family history, past medical history, past social history, past surgical history and problem list. Problem list updated.  Objective:   Vitals:   01/04/17 1416  BP: 114/75  Pulse: (!) 103  Weight: 261 lb 1.6 oz (118.4 kg)    Fetal Status: Fetal Heart Rate (bpm): 140 Fundal Height: 28 cm Movement: Present     General:  Alert, oriented and cooperative. Patient is in no acute distress.  Skin: Skin is warm and dry. No rash noted.   Cardiovascular: Normal heart rate noted  Respiratory: Normal respiratory effort, no problems with respiration noted  Abdomen: Soft, gravid, appropriate for gestational age. Pain/Pressure: Present     Pelvic:  Cervical exam deferred        Extremities: Normal range of motion.  Edema: None  Mental Status: Normal mood and affect. Normal behavior. Normal judgment and thought content.   Assessment and Plan:  Pregnancy: [id number] at 5149w2d  1. Supervision of high risk pregnancy, antepartum Patient is doing well without complaints Third trimester labs, glucola and tdap today -  Glucose Tolerance, 2 Hours w/1 Hour - HIV antibody - RPR - CBC  2. Hx gestational diabetes x 2 Nl early testing  3. Elderly multigravida with antepartum condition or complication Follow up growth tomorrow  4. Grand multipara   Preterm labor symptoms and general obstetric precautions including but not limited to vaginal bleeding, contractions, leaking of fluid and fetal movement were reviewed in detail with the patient. Please refer to After Visit Summary for other counseling recommendations.  Return in about 2 weeks (around 01/18/2017).   Catalina AntiguaPeggy Oluwadarasimi Redmon, MD

## 2017-01-05 ENCOUNTER — Other Ambulatory Visit: Payer: Self-pay | Admitting: Obstetrics and Gynecology

## 2017-01-05 ENCOUNTER — Encounter: Payer: Self-pay | Admitting: Obstetrics and Gynecology

## 2017-01-05 ENCOUNTER — Ambulatory Visit (HOSPITAL_COMMUNITY)
Admission: RE | Admit: 2017-01-05 | Discharge: 2017-01-05 | Disposition: A | Discharge status: Home / Self Care | Payer: Medicaid Other | Source: Ambulatory Visit | Attending: Certified Nurse Midwife | Admitting: Certified Nurse Midwife

## 2017-01-05 DIAGNOSIS — O24419 Gestational diabetes mellitus in pregnancy, unspecified control: Secondary | ICD-10-CM

## 2017-01-05 LAB — HIV ANTIBODY (ROUTINE TESTING W REFLEX): HIV Screen 4th Generation wRfx: NONREACTIVE

## 2017-01-05 LAB — GLUCOSE TOLERANCE, 2 HOURS W/ 1HR
Glucose, 1 hour: 172 mg/dL (ref 65–179)
Glucose, 2 hour: 107 mg/dL (ref 65–152)
Glucose, Fasting: 97 mg/dL — ABNORMAL HIGH (ref 65–91)

## 2017-01-05 LAB — CBC
Hematocrit: 34.8 % (ref 34.0–46.6)
Hemoglobin: 11.4 g/dL (ref 11.1–15.9)
MCH: 28.7 pg (ref 26.6–33.0)
MCHC: 32.8 g/dL (ref 31.5–35.7)
MCV: 88 fL (ref 79–97)
Platelets: 225 10*3/uL (ref 150–379)
RBC: 3.97 x10E6/uL (ref 3.77–5.28)
RDW: 14 % (ref 12.3–15.4)
WBC: 10.5 10*3/uL (ref 3.4–10.8)

## 2017-01-05 LAB — RPR: RPR Ser Ql: NONREACTIVE

## 2017-01-05 MED ORDER — ACCU-CHEK FASTCLIX LANCETS MISC: 1.0000 [IU] | Freq: Four times a day (QID) | 12 refills | Status: DC

## 2017-01-05 MED ORDER — ACCU-CHEK NANO SMARTVIEW W/DEVICE KIT: 1.0000 | PACK | 0 refills | Status: DC

## 2017-01-05 MED ORDER — GLUCOSE BLOOD VI STRP: ORAL_STRIP | 12 refills | Status: DC

## 2017-01-07 ENCOUNTER — Telehealth: Payer: Self-pay

## 2017-01-07 NOTE — Telephone Encounter (Signed)
Pt aware of GDM and to bring CBG log to every visit. Pt is already scheduled at [facility] on Tuesday and she said she has already picked up her diabetic supplies.

## 2017-01-07 NOTE — Telephone Encounter (Signed)
-----   Message from Catalina AntiguaPeggy Constant, MD sent at 01/05/2017  7:59 AM EST ----- Please inform patient of gestational diabetes diagnosis and schedule diabetic education appointment. Testing supplies have been e-prescribed. Please advised the patient to bring CBG testing supplies with her at her diabetic education session. She can even start checking CBG 4 times daily (fasting and 2 hr pp) and ask her to keep a log  Thanks  Kinder Morgan EnergyPeggy

## 2017-01-12 ENCOUNTER — Encounter: Payer: Self-pay | Admitting: *Deleted

## 2017-01-12 ENCOUNTER — Encounter: Payer: Medicaid Other | Attending: Obstetrics and Gynecology | Admitting: *Deleted

## 2017-01-12 ENCOUNTER — Encounter (HOSPITAL_COMMUNITY): Payer: Self-pay

## 2017-01-12 ENCOUNTER — Other Ambulatory Visit (HOSPITAL_COMMUNITY): Payer: Self-pay | Admitting: Maternal and Fetal Medicine

## 2017-01-12 ENCOUNTER — Ambulatory Visit (HOSPITAL_COMMUNITY)
Admission: RE | Admit: 2017-01-12 | Discharge: 2017-01-12 | Disposition: A | Discharge status: Home / Self Care | Payer: Medicaid Other | Source: Ambulatory Visit | Attending: Certified Nurse Midwife | Admitting: Certified Nurse Midwife

## 2017-01-12 DIAGNOSIS — O09523 Supervision of elderly multigravida, third trimester: Secondary | ICD-10-CM

## 2017-01-12 DIAGNOSIS — O09522 Supervision of elderly multigravida, second trimester: Secondary | ICD-10-CM

## 2017-01-12 DIAGNOSIS — Z3A28 28 weeks gestation of pregnancy: Secondary | ICD-10-CM | POA: Insufficient documentation

## 2017-01-12 DIAGNOSIS — O0943 Supervision of pregnancy with grand multiparity, third trimester: Secondary | ICD-10-CM

## 2017-01-12 DIAGNOSIS — O09293 Supervision of pregnancy with other poor reproductive or obstetric history, third trimester: Secondary | ICD-10-CM | POA: Diagnosis not present

## 2017-01-12 DIAGNOSIS — O24410 Gestational diabetes mellitus in pregnancy, diet controlled: Secondary | ICD-10-CM

## 2017-01-12 DIAGNOSIS — O24419 Gestational diabetes mellitus in pregnancy, unspecified control: Secondary | ICD-10-CM | POA: Insufficient documentation

## 2017-01-12 DIAGNOSIS — Z3A00 Weeks of gestation of pregnancy not specified: Secondary | ICD-10-CM | POA: Diagnosis not present

## 2017-01-12 DIAGNOSIS — Z713 Dietary counseling and surveillance: Secondary | ICD-10-CM | POA: Diagnosis not present

## 2017-01-12 DIAGNOSIS — F509 Eating disorder, unspecified: Secondary | ICD-10-CM

## 2017-01-12 NOTE — Progress Notes (Signed)
  Medical Nutrition Therapy:  Appt start time: 0900 end time:  1015.   Assessment:  Primary concerns today: Shelly Lane is here for nutrition counseling pertaining to referral for GDM.  She questions her diabetes diagnosis.  GTT 1 and 2 hour were normal. Fasting glucose was elevated.  Is checking her glucose at home and numbers are normal.  A1C 4 months ago was normal.  She would really like to have a water birth and was told she can't if she has diabetes. Has history of GDM, but feels she doesn't have diabetes now  Does the grocery shopping and cooking.  She typically bakes or pan sears food.  Does not fry food much.  She does not eat out often.  When at home she eats in the living room.  She does not get distracted while eating and is not a fast eater.  She is not a picky.  States her schedule is nocturnal.  Doesn't eat structured meals  Has history of bulimia, but is not physically able topurge anymore due to surgery.  Is glad she stopped, but the mental aspect hasn't gone away.  SIV for 10 years.  Did not get professional help.  Was admitted to the hospital for hypokalemia at one point.  States she is currently trying to eat less to control her weight since she can't purge.  This pregnacy she is eating more than normally.  Is interested in counseling  Preferred Learning Style:   No preference indicated   Learning Readiness:  Ready   MEDICATIONS: see list   DIETARY INTAKE: Usual eating pattern includes 2 meals and 1 snacks per day.   Avoided foods include pork.    24-hr recall:  Didn't eat and felt bad physically Spaghetti  Beverages: water, adequate  Normal day Wakes up in afternoon: 2 eggs with cheese and spinach or mushrooms and 2 slices [place] bacon Might have banana as a snack or celery with PB Dinner is salmon, salad, plaintains with sauce.  Might have smoked [place] with rice and salad Might have cereal around 2 am  Usual physical activity: none due to physical pain from  reported sciatica   Estimated energy needs: 2800 calories 350 g carbohydrates 140 g protein 93 g fat    Nutritional Diagnosis:  NI-1.4 Inadequate energy intake As related to disordered eating/restriction.  As evidenced by deitary recall.    Intervention:  Nutrition counseling provided.  Encouraged her to talk with doctor about GDM concerns/birthing concerns.  My biggest concern is her disordered eating.  Gave therapy recommendations and resources to [facility].  Discussed food is fuel and what happens to the body when it doesn't get enough fuel.  It is possible that decades of semi-starvation have decreased her metabolic rate and increased fat storage.  Now that she is eating some, she could be storing more of her food as fat.  Advised fueling her body adequately.  challenged cognitive distortions.  Recommended eating something small about every 3 hours she is awake  Teaching Method Utilized:  Auditory   Barriers to learning/adherence to lifestyle change: disordered eating  Demonstrated degree of understanding via:  Teach Back   Monitoring/Evaluation:  Dietary intake, exercise, labs, and body weight in a few week(s).

## 2017-01-12 NOTE — Patient Instructions (Addendum)
Try Lactaid tablets when you have milk food Try Mike CrazeKarla Lane for counseling [phone] You need food.  Period.  Your metabolism will straight out when you fuel yourself consistently.  Try to eat something small about every 3 hours you're awake.  Try to have protein each time.

## 2017-01-13 ENCOUNTER — Encounter: Payer: Medicaid Other | Admitting: Certified Nurse Midwife

## 2017-01-13 ENCOUNTER — Other Ambulatory Visit (HOSPITAL_COMMUNITY): Payer: Self-pay | Admitting: *Deleted

## 2017-01-13 DIAGNOSIS — O24410 Gestational diabetes mellitus in pregnancy, diet controlled: Secondary | ICD-10-CM

## 2017-01-18 ENCOUNTER — Ambulatory Visit (INDEPENDENT_AMBULATORY_CARE_PROVIDER_SITE_OTHER): Payer: Medicaid Other | Admitting: Certified Nurse Midwife

## 2017-01-18 VITALS — BP 109/76 | HR 109 | Wt 276.0 lb

## 2017-01-18 DIAGNOSIS — D508 Other iron deficiency anemias: Secondary | ICD-10-CM

## 2017-01-18 DIAGNOSIS — G894 Chronic pain syndrome: Secondary | ICD-10-CM

## 2017-01-18 DIAGNOSIS — O09213 Supervision of pregnancy with history of pre-term labor, third trimester: Secondary | ICD-10-CM

## 2017-01-18 DIAGNOSIS — D649 Anemia, unspecified: Secondary | ICD-10-CM | POA: Insufficient documentation

## 2017-01-18 DIAGNOSIS — O09899 Supervision of other high risk pregnancies, unspecified trimester: Secondary | ICD-10-CM

## 2017-01-18 DIAGNOSIS — Z8632 Personal history of gestational diabetes: Secondary | ICD-10-CM

## 2017-01-18 DIAGNOSIS — O039 Complete or unspecified spontaneous abortion without complication: Secondary | ICD-10-CM

## 2017-01-18 DIAGNOSIS — O24913 Unspecified diabetes mellitus in pregnancy, third trimester: Secondary | ICD-10-CM

## 2017-01-18 DIAGNOSIS — O09219 Supervision of pregnancy with history of pre-term labor, unspecified trimester: Secondary | ICD-10-CM

## 2017-01-18 DIAGNOSIS — O0993 Supervision of high risk pregnancy, unspecified, third trimester: Secondary | ICD-10-CM

## 2017-01-18 DIAGNOSIS — L2084 Intrinsic (allergic) eczema: Secondary | ICD-10-CM

## 2017-01-18 DIAGNOSIS — O24419 Gestational diabetes mellitus in pregnancy, unspecified control: Secondary | ICD-10-CM

## 2017-01-18 MED ORDER — CLOBETASOL PROPIONATE 0.05 % EX OINT: 1.0000 "application " | TOPICAL_OINTMENT | Freq: Two times a day (BID) | CUTANEOUS | 0 refills | Status: DC

## 2017-01-18 NOTE — Progress Notes (Signed)
Pt states increase in LE swelling. Pt states swelling get better with rest.

## 2017-01-18 NOTE — Progress Notes (Signed)
PRENATAL VISIT NOTE  Subjective:  Shelly Lane is a 41 y.o. [id number] at 1250w2d being seen today for ongoing prenatal care.  She is currently monitored for the following issues for this high-risk pregnancy and has OBESITY; ANXIETY; BULIMIA; DEPRESSION; SYMPTOM, PAIN, ABDOMINAL, EPIGASTRIC; Elderly multigravida with antepartum condition or complication; SAB (spontaneous abortion) x 4; Grand multipara; Hx of preterm delivery x 2, currently pregnant; Hx gestational diabetes x 2; Multiple drug allergies; History of abnormal cervical Pap smear; Supervision of high-risk pregnancy; Chronic pain disorder; Drug abuse; and Gestational diabetes mellitus (GDM) affecting pregnancy, antepartum on her problem list.  Patient reports backache, no bleeding, no contractions, no cramping, no leaking and has not been checking her blood sugars since being at diabetic education.  States that she knows she is not diabetic.  She only failed one value.  Has chronic back pain, lost her RX for abdominal support belt.  Desires another one.  states that she is having more eczema and needs a refil on clobetasol ointment.  Refill given for clobetasol and maternity support belt. Encouraged to check blood sugars and reevaluate in 2 weeks to determine if she needs medications to help control her blood sugars.  Normal ranges reviewed with patient along with diet. Patient verbalized undertstanding.  Really wants waterbirth. Also, reports lower leg edema, normotensive.  Contractions: Not present. Vag. Bleeding: None.  Movement: Present. Denies leaking of fluid.   The following portions of the patient's history were reviewed and updated as appropriate: allergies, current medications, past family history, past medical history, past social history, past surgical history and problem list. Problem list updated.  Objective:   Vitals:   01/18/17 1610  BP: 109/76  Pulse: (!) 109  Weight: 276 lb (125.2 kg)    Fetal Status: Fetal Heart  Rate (bpm): 142 Fundal Height: 30 cm Movement: Present     General:  Alert, oriented and cooperative. Patient is in no acute distress.  Skin: Skin is warm and dry. No rash noted.   Cardiovascular: Normal heart rate noted  Respiratory: Normal respiratory effort, no problems with respiration noted  Abdomen: Soft, gravid, appropriate for gestational age. Pain/Pressure: Present     Pelvic:  Cervical exam deferred        Extremities: Normal range of motion.     Mental Status: Normal mood and affect. Normal behavior. Normal judgment and thought content.   Assessment and Plan:  Pregnancy: [id number] at 8050w2d  1. Gestational diabetes mellitus (GDM) affecting pregnancy, antepartum     Non-compliant.  Has attended teaching.  Has not been checking blood sugars.  Reviewed normal ranges and diet with patient. Educated to check her CBGs.    2. Supervision of high risk pregnancy in third trimester     GDM, non-complinat     Rx: abdominal support belt given  3. SAB (spontaneous abortion) x 4      Stable  4. Hx of preterm delivery x 2, currently pregnant     17-P refused.   5. Hx gestational diabetes x 2     GDM this pregnancy     EFW: 77% 01/12/17.  Growth US scheduled for 02/23/17.     6. Chronic pain disorder     Takes oxycodone/morphine for chronic back pain    7. Other iron deficiency anemia     OTC Iron  8. Intrinsic eczema     Hx of exzema - clobetasol ointment (TEMOVATE) 0.05 %; Apply 1 application topically 2 (two) times daily.  Dispense: 30  g; Refill: 0  Preterm labor symptoms and general obstetric precautions including but not limited to vaginal bleeding, contractions, leaking of fluid and fetal movement were reviewed in detail with the patient. Please refer to After Visit Summary for other counseling recommendations.  Return in about 2 weeks (around 02/01/2017) for [facility].   Roe Coombs, CNM

## 2017-01-27 ENCOUNTER — Telehealth: Payer: Self-pay

## 2017-01-27 NOTE — Telephone Encounter (Signed)
Returned call, no answer, left vm 

## 2017-01-28 ENCOUNTER — Telehealth: Payer: Self-pay

## 2017-01-28 NOTE — Telephone Encounter (Signed)
Returned call, patient had questions about safe meds to take for cold symptoms.

## 2017-02-01 ENCOUNTER — Ambulatory Visit: Payer: Medicaid Other | Admitting: *Deleted

## 2017-02-03 ENCOUNTER — Ambulatory Visit (INDEPENDENT_AMBULATORY_CARE_PROVIDER_SITE_OTHER): Payer: Medicaid Other | Admitting: Obstetrics and Gynecology

## 2017-02-03 VITALS — BP 108/68 | HR 107 | Wt 277.3 lb

## 2017-02-03 DIAGNOSIS — O09219 Supervision of pregnancy with history of pre-term labor, unspecified trimester: Secondary | ICD-10-CM

## 2017-02-03 DIAGNOSIS — O09523 Supervision of elderly multigravida, third trimester: Secondary | ICD-10-CM

## 2017-02-03 DIAGNOSIS — G894 Chronic pain syndrome: Secondary | ICD-10-CM

## 2017-02-03 DIAGNOSIS — Z641 Problems related to multiparity: Secondary | ICD-10-CM

## 2017-02-03 DIAGNOSIS — O0993 Supervision of high risk pregnancy, unspecified, third trimester: Secondary | ICD-10-CM

## 2017-02-03 DIAGNOSIS — O24913 Unspecified diabetes mellitus in pregnancy, third trimester: Secondary | ICD-10-CM

## 2017-02-03 DIAGNOSIS — O09529 Supervision of elderly multigravida, unspecified trimester: Secondary | ICD-10-CM

## 2017-02-03 DIAGNOSIS — O24419 Gestational diabetes mellitus in pregnancy, unspecified control: Secondary | ICD-10-CM

## 2017-02-03 DIAGNOSIS — O09213 Supervision of pregnancy with history of pre-term labor, third trimester: Secondary | ICD-10-CM

## 2017-02-03 DIAGNOSIS — O09899 Supervision of other high risk pregnancies, unspecified trimester: Secondary | ICD-10-CM

## 2017-02-03 MED ORDER — GLYBURIDE 2.5 MG PO TABS: 2.5000 mg | ORAL_TABLET | Freq: Two times a day (BID) | ORAL | 3 refills | Status: DC

## 2017-02-03 MED ORDER — GLYBURIDE 2.5 MG PO TABS: 2.5000 mg | ORAL_TABLET | Freq: Every day | ORAL | 3 refills | Status: DC

## 2017-02-03 NOTE — Progress Notes (Signed)
   PRENATAL VISIT NOTE  Subjective:  Shelly Lane is a 41 y.o. [id number] at 855w4d being seen today for ongoing prenatal care.  She is currently monitored for the following issues for this high-risk pregnancy and has OBESITY; ANXIETY; BULIMIA; DEPRESSION; SYMPTOM, PAIN, ABDOMINAL, EPIGASTRIC; Elderly multigravida with antepartum condition or complication; SAB (spontaneous abortion) x 4; Grand multipara; Hx of preterm delivery x 2, currently pregnant; Hx gestational diabetes x 2; Multiple drug allergies; History of abnormal cervical Pap smear; Supervision of high-risk pregnancy; Chronic pain disorder; Drug abuse; and Gestational diabetes mellitus (GDM) affecting pregnancy, antepartum on her problem list.  Patient reports no complaints.  Contractions: Irritability. Vag. Bleeding: None.  Movement: Present. Denies leaking of fluid.   The following portions of the patient's history were reviewed and updated as appropriate: allergies, current medications, past family history, past medical history, past social history, past surgical history and problem list. Problem list updated.  Objective:   Vitals:   02/03/17 1412  BP: 108/68  Pulse: (!) 107  Weight: 277 lb 4.8 oz (125.8 kg)    Fetal Status: Fetal Heart Rate (bpm): 138   Movement: Present     General:  Alert, oriented and cooperative. Patient is in no acute distress.  Skin: Skin is warm and dry. No rash noted.   Cardiovascular: Normal heart rate noted  Respiratory: Normal respiratory effort, no problems with respiration noted  Abdomen: Soft, gravid, appropriate for gestational age. Pain/Pressure: Present     Pelvic:  Cervical exam deferred        Extremities: Normal range of motion.  Edema: None  Mental Status: Normal mood and affect. Normal behavior. Normal judgment and thought content.   Assessment and Plan:  Pregnancy: [id number] at 4955w4d  1. Gestational diabetes mellitus (GDM) affecting pregnancy, antepartum CBGs reviewed and all  fasting elevated 110's. Most pp within range but some as high as 224. Patient is not adhering to the diet and is not able to walk more due to back pain - Rx Glyburide provided 2.5 mg qHS Follow up growth ultrasounf 3/20  2. Supervision of high risk pregnancy in third trimester Patient is without complaints  3. Elderly multigravida with antepartum condition or complication Normal NIPS  4. Grand multipara   5. Chronic pain disorder On percocet  6. Hx of preterm delivery x 2, currently pregnant Declined 17-p  Preterm labor symptoms and general obstetric precautions including but not limited to vaginal bleeding, contractions, leaking of fluid and fetal movement were reviewed in detail with the patient. Please refer to After Visit Summary for other counseling recommendations.  No Follow-up on file.   Catalina AntiguaPeggy Maliki Gignac, MD

## 2017-02-09 ENCOUNTER — Other Ambulatory Visit: Payer: Self-pay | Admitting: Student

## 2017-02-09 DIAGNOSIS — O2342 Unspecified infection of urinary tract in pregnancy, second trimester: Secondary | ICD-10-CM

## 2017-02-11 MED ORDER — NITROFURANTOIN MONOHYD MACRO 100 MG PO CAPS: 100.0000 mg | ORAL_CAPSULE | Freq: Two times a day (BID) | ORAL | 0 refills | Status: DC

## 2017-02-22 ENCOUNTER — Encounter (HOSPITAL_COMMUNITY): Payer: Self-pay | Admitting: *Deleted

## 2017-02-22 ENCOUNTER — Encounter: Payer: Self-pay | Admitting: *Deleted

## 2017-02-22 ENCOUNTER — Ambulatory Visit (INDEPENDENT_AMBULATORY_CARE_PROVIDER_SITE_OTHER): Payer: Medicaid Other | Admitting: Obstetrics and Gynecology

## 2017-02-22 ENCOUNTER — Inpatient Hospital Stay (HOSPITAL_COMMUNITY)
Admission: AD | Admit: 2017-02-22 | Discharge: 2017-02-22 | Disposition: A | Discharge status: Home / Self Care | Payer: Medicaid Other | Source: Ambulatory Visit | Attending: Obstetrics and Gynecology | Admitting: Obstetrics and Gynecology

## 2017-02-22 ENCOUNTER — Inpatient Hospital Stay (HOSPITAL_COMMUNITY): Payer: Medicaid Other

## 2017-02-22 VITALS — BP 107/72 | HR 112 | Wt 270.0 lb

## 2017-02-22 DIAGNOSIS — O479 False labor, unspecified: Secondary | ICD-10-CM | POA: Diagnosis not present

## 2017-02-22 DIAGNOSIS — Z7984 Long term (current) use of oral hypoglycemic drugs: Secondary | ICD-10-CM | POA: Insufficient documentation

## 2017-02-22 DIAGNOSIS — O09529 Supervision of elderly multigravida, unspecified trimester: Secondary | ICD-10-CM

## 2017-02-22 DIAGNOSIS — O24419 Gestational diabetes mellitus in pregnancy, unspecified control: Secondary | ICD-10-CM | POA: Diagnosis not present

## 2017-02-22 DIAGNOSIS — Z3A34 34 weeks gestation of pregnancy: Secondary | ICD-10-CM | POA: Diagnosis not present

## 2017-02-22 DIAGNOSIS — O24913 Unspecified diabetes mellitus in pregnancy, third trimester: Secondary | ICD-10-CM

## 2017-02-22 DIAGNOSIS — O4703 False labor before 37 completed weeks of gestation, third trimester: Secondary | ICD-10-CM | POA: Diagnosis not present

## 2017-02-22 DIAGNOSIS — Z885 Allergy status to narcotic agent status: Secondary | ICD-10-CM | POA: Diagnosis not present

## 2017-02-22 DIAGNOSIS — O09523 Supervision of elderly multigravida, third trimester: Secondary | ICD-10-CM

## 2017-02-22 DIAGNOSIS — O0993 Supervision of high risk pregnancy, unspecified, third trimester: Secondary | ICD-10-CM

## 2017-02-22 DIAGNOSIS — O288 Other abnormal findings on antenatal screening of mother: Secondary | ICD-10-CM

## 2017-02-22 DIAGNOSIS — Z641 Problems related to multiparity: Secondary | ICD-10-CM

## 2017-02-22 LAB — URINALYSIS, ROUTINE W REFLEX MICROSCOPIC
Bilirubin Urine: NEGATIVE
Glucose, UA: NEGATIVE mg/dL
Hgb urine dipstick: NEGATIVE
Ketones, ur: 20 mg/dL — AB
Leukocytes, UA: NEGATIVE
Nitrite: NEGATIVE
Protein, ur: NEGATIVE mg/dL
Specific Gravity, Urine: 1.006 (ref 1.005–1.030)
pH: 6 (ref 5.0–8.0)

## 2017-02-22 NOTE — MAU Provider Note (Signed)
History     CSN: [id number]  Arrival date and time: 02/22/17 1508   First Provider Initiated Contact with Patient 02/22/17 1538      No chief complaint on file.  HPI   [name] is a 41 y.o. female [id number] @ 36w2dhere in MAU for prolonged fetal monitoring and an [place] BPP. She was seen by her OB today in the office and had an NST that was non-reactive with a deceleration. She is also complaining of pelvic pressure and vaginal pain. She is requesting her cervix to be checked.   OB History    Gravida Para Term Preterm AB Living   _0 SAB TAB Ectopic Multiple Live Births   3     0 8      Past Medical History:  Diagnosis Date  . Abnormal Pap smear    Colpo;Last pap 2012, ASCUS w/ negative HPV  . Anemia    during preg  . Anxiety   . ASCUS with positive high risk HPV   . Depression    ok now  . Eczema   . GERD (gastroesophageal reflux disease)   . Gestational diabetes   . H/O candidiasis   . H/O migraine   . H/O rubella   . H/O varicella   . H/O: depression   . H/O: obesity   . Headache(784.0)   . Hx: UTI (urinary tract infection)   . Miscarriage    3/13 and 7/13   . Preterm labor     Past Surgical History:  Procedure Laterality Date  . CHOLECYSTECTOMY  2008  . HERNIA REPAIR  2012  . HIATAL HERNIA REPAIR  2012  . WISDOM TOOTH EXTRACTION      Family History  Problem Relation Age of Onset  . Diabetes Maternal Grandmother   . Hypertension Maternal Grandmother   . Anemia Mother     Social History  Substance Use Topics  . Smoking status: Never Smoker  . Smokeless tobacco: Never Used  . Alcohol use No    Allergies:  Allergies  Allergen Reactions  . Vicodin [Hydrocodone-Acetaminophen] Itching    Prescriptions Prior to Admission  Medication Sig Dispense Refill Last Dose  . ACCU-CHEK FASTCLIX LANCETS MISC 1 Units by Percutaneous route 4 (four) times daily. 100 each 12   . ALFALFA PO Take by mouth.   Taking  . Blood Glucose  Monitoring Suppl (ACCU-CHEK NANO SMARTVIEW) w/Device KIT 1 kit by Subdermal route as directed. Check blood sugars for fasting, and two hours after breakfast, lunch and dinner (4 checks daily) 1 kit 0   . clobetasol ointment (TEMOVATE) 00.30% Apply 1 application topically 2 (two) times daily. 30 g 0 Taking  . cyclobenzaprine (FLEXERIL) 10 MG tablet TK 1 T PO QHS  2 Taking  . fluticasone (FLONASE) 50 MCG/ACT nasal spray Place 2 sprays into both nostrils daily.   Not Taking  . gabapentin (NEURONTIN) 300 MG capsule Take 1 capsule (300 mg total) by mouth 3 (three) times daily. 90 capsule 5 Taking  . glucose blood (ACCU-CHEK SMARTVIEW) test strip Use as instructed to check blood sugars 100 each 12   . glyBURIDE (DIABETA) 2.5 MG tablet Take 1 tablet (2.5 mg total) by mouth at bedtime. 30 tablet 3   . HYDROcodone-acetaminophen (NORCO/VICODIN) 5-325 MG tablet Take 1 tablet by mouth every 6 (six) hours as needed for moderate pain.   Not Taking  . loratadine (CLARITIN) 10 MG tablet Take 10  mg by mouth daily.   Not Taking  . nitrofurantoin, macrocrystal-monohydrate, (MACROBID) 100 MG capsule Take 1 capsule (100 mg total) by mouth 2 (two) times daily. 14 capsule 0   . oxyCODONE-acetaminophen (PERCOCET) 7.5-325 MG tablet Take 1 tablet by mouth every 8 (eight) hours as needed for severe pain.   Taking  . Prenatal Vit-Fe Fumarate-FA (PRENATAL MULTIVITAMIN) TABS tablet Take 1 tablet by mouth daily at 12 noon.   Not Taking  . Prenatal-Fe Asparto Gly-FA (PRENATE STAR) 20-1 MG TABS Take 1 tablet by mouth daily. (Patient not taking: Reported on 02/03/2017) 30 tablet 11 Not Taking  . Prenatal-Fe Fum-Methf-FA w/o A (VITAFOL-NANO) 18-0.6-0.4 MG TABS Take 1 tablet by mouth daily. 30 tablet 12 Taking   Results for orders placed or performed during the hospital encounter of 02/22/17 (from the past 48 hour(s))  Urinalysis, Routine w reflex microscopic     Status: Abnormal   Collection Time: 02/22/17  3:10 PM  Result Value Ref  Range   Color, Urine YELLOW YELLOW   APPearance CLEAR CLEAR   Specific Gravity, Urine 1.006 1.005 - 1.030   pH 6.0 5.0 - 8.0   Glucose, UA NEGATIVE NEGATIVE mg/dL   Hgb urine dipstick NEGATIVE NEGATIVE   Bilirubin Urine NEGATIVE NEGATIVE   Ketones, ur 20 (A) NEGATIVE mg/dL   Protein, ur NEGATIVE NEGATIVE mg/dL   Nitrite NEGATIVE NEGATIVE   Leukocytes, UA NEGATIVE NEGATIVE   No results found.  Review of Systems  Constitutional: Positive for fever.  Gastrointestinal: Positive for abdominal pain.  Genitourinary: Positive for pelvic pain and vaginal pain.   Physical Exam   Blood pressure (!) 109/56, pulse (!) 102, temperature 98 F (36.7 C), temperature source Oral, resp. rate 20, SpO2 98 %, currently breastfeeding.  Physical Exam  Constitutional: She is oriented to person, place, and time. She appears well-developed and well-nourished. No distress.  HENT:  Head: Normocephalic.  Eyes: Pupils are equal, round, and reactive to light.  GI: Soft. She exhibits no distension. There is no tenderness. There is no rebound and no guarding.  Genitourinary:  Genitourinary Comments: Dilation: Closed Effacement (%): Thick Cervical Position: Anterior Exam by:: Lenna Sciara Coreen Shippee, NP  Musculoskeletal: Normal range of motion.  Neurological: She is alert and oriented to person, place, and time.  Skin: Skin is warm. She is not diaphoretic.  Psychiatric: Her behavior is normal.   Fetal Tracing: Baseline: 145 bpm  Variability: min/mod Accelerations: 10x10 & 15x15 Decelerations: none Toco: irregular pattern   MAU Course  Procedures  None  MDM  Reviewed fetal tracing and BPP with Dr. Rip Harbour BPP 8/8  Assessment and Plan   A:  1. Braxton Hicks contractions   2. Non-reactive NST (non-stress test)   3. [redacted] weeks gestation of pregnancy     P:  Discharge home in stable condition [place] tomorrow for growth.  NST Thursday/Friday: message sent to the [facility]  Return to MAU if symptoms  worsen Fetal kick counts  Preterm labor precautions   Lezlie Lye, NP 02/22/2017 7:20 PM

## 2017-02-22 NOTE — Discharge Instructions (Signed)
Nonstress Test The nonstress test is a procedure that monitors the fetus's heartbeat. The test will monitor the heartbeat when the fetus is at rest and while the fetus is moving. In a healthy fetus, there will be an increase in fetal heart rate when the fetus moves or kicks. The heart rate will decrease at rest. This test helps determine if the fetus is healthy. Your health care provider will look at a number of patterns in the heart rate tracing to make sure your baby is thriving. If there is concern, your health care provider may order additional tests or may suggest another course of action. This test is often done in the third trimester and can help determine if an early delivery is needed and safe. Common reasons to have this test are:  You are past your due date.  You have a high-risk pregnancy.  You are feeling less movement than normal.  You have lost a pregnancy in the past.  Your health care provider suspects fetal growth problems.  You have too much or too little amniotic fluid. What happens before the procedure?  Eat a meal right before the test or as directed by your health care provider. Food may help stimulate fetal movements.  Use the restroom right before the test. What happens during the procedure?  Two belts will be placed around your abdomen. These belts have monitors attached to them. One records the fetal heart rate and the other records uterine contractions.  You may be asked to lie down on your side or to stay sitting upright.  You may be given a button to press when you feel movement.  The fetal heartbeat is listened to and watched on a screen. The heartbeat is recorded on a sheet of paper.  If the fetus seems to be sleeping, you may be asked to drink some juice or soda, gently press your abdomen, or make some noise to wake the fetus. What happens after the procedure? Your health care provider will discuss the test results with you and make recommendations for  the near future.  This information is not intended to replace advice given to you by your health care provider. Make sure you discuss any questions you have with your health care provider. This information is not intended to replace advice given to you by your health care provider. Make sure you discuss any questions you have with your health care provider. Document Released: 11/13/2002 Document Revised: 10/23/2016 Document Reviewed: 12/27/2012 Elsevier Interactive Patient Education  2017 [employer] of the uterus can occur throughout pregnancy, but they are not always a sign that you are in labor. You may have practice contractions called Braxton Hicks contractions. These false labor contractions are sometimes confused with true labor. What are Deberah Pelton contractions? Braxton Hicks contractions are tightening movements that occur in the muscles of the uterus before labor. Unlike true labor contractions, these contractions do not result in opening (dilation) and thinning of the cervix. Toward the end of pregnancy (32-34 weeks), Braxton Hicks contractions can happen more often and may become stronger. These contractions are sometimes difficult to tell apart from true labor because they can be very uncomfortable. You should not feel embarrassed if you go to the hospital with false labor. Sometimes, the only way to tell if you are in true labor is for your health care provider to look for changes in the cervix. The health care provider will do a physical exam and may monitor your  contractions. If you are not in true labor, the exam should show that your cervix is not dilating and your water has not broken. If there are no prenatal problems or other health problems associated with your pregnancy, it is completely safe for you to be sent home with false labor. You may continue to have Braxton Hicks contractions until you go into true labor. How can I tell the  difference between true labor and false labor?  Differences  False labor  Contractions last 30-70 seconds.: Contractions are usually shorter and not as strong as true labor contractions.  Contractions become very regular.: Contractions are usually irregular.  Discomfort is usually felt in the top of the uterus, and it spreads to the lower abdomen and low back.: Contractions are often felt in the front of the lower abdomen and in the groin.  Contractions do not go away with walking.: Contractions may go away when you walk around or change positions while lying down.  Contractions usually become more intense and increase in frequency.: Contractions get weaker and are shorter-lasting as time goes on.  The cervix dilates and gets thinner.: The cervix usually does not dilate or become thin. Follow these instructions at home:  Take over-the-counter and prescription medicines only as told by your health care provider.  Keep up with your usual exercises and follow other instructions from your health care provider.  Eat and drink lightly if you think you are going into labor.  If Braxton Hicks contractions are making you uncomfortable:  Change your position from lying down or resting to walking, or change from walking to resting.  Sit and rest in a tub of warm water.  Drink enough fluid to keep your urine clear or pale yellow. Dehydration may cause these contractions.  Do slow and deep breathing several times an hour.  Keep all follow-up prenatal visits as told by your health care provider. This is important. Contact a health care provider if:  You have a fever.  You have continuous pain in your abdomen. Get help right away if:  Your contractions become stronger, more regular, and closer together.  You have fluid leaking or gushing from your vagina.  You pass blood-tinged mucus (bloody show).  You have bleeding from your vagina.  You have low back pain that you never had  before.  You feel your babys head pushing down and causing pelvic pressure.  Your baby is not moving inside you as much as it used to. Summary  Contractions that occur before labor are called Braxton Hicks contractions, false labor, or practice contractions.  Braxton Hicks contractions are usually shorter, weaker, farther apart, and less regular than true labor contractions. True labor contractions usually become progressively stronger and regular and they become more frequent.  Manage discomfort from [facility] contractions by changing position, resting in a warm bath, drinking plenty of water, or practicing deep breathing. This information is not intended to replace advice given to you by your health care provider. Make sure you discuss any questions you have with your health care provider. Document Released: 11/23/2005 Document Revised: 10/12/2016 Document Reviewed: 10/12/2016 Elsevier Interactive Patient Education  2017 [employer]

## 2017-02-22 NOTE — Progress Notes (Signed)
   PRENATAL VISIT NOTE  Subjective:  Shelly Lane is a 41 y.o. [id number] at 7595w2d being seen today for ongoing prenatal care.  She is currently monitored for the following issues for this high-risk pregnancy and has OBESITY; ANXIETY; BULIMIA; DEPRESSION; SYMPTOM, PAIN, ABDOMINAL, EPIGASTRIC; Elderly multigravida with antepartum condition or complication; SAB (spontaneous abortion) x 4; Grand multipara; Hx of preterm delivery x 2, currently pregnant; Hx gestational diabetes x 2; Multiple drug allergies; History of abnormal cervical Pap smear; Supervision of high-risk pregnancy; Chronic pain disorder; Drug abuse; and Gestational diabetes mellitus (GDM) affecting pregnancy, antepartum on her problem list.  Patient reports no complaints.  Contractions: Irregular. Vag. Bleeding: None.  Movement: Present. Denies leaking of fluid.   The following portions of the patient's history were reviewed and updated as appropriate: allergies, current medications, past family history, past medical history, past social history, past surgical history and problem list. Problem list updated.  Objective:   Vitals:   02/22/17 1407  BP: 107/72  Pulse: (!) 112  Weight: 270 lb (122.5 kg)    Fetal Status: Fetal Heart Rate (bpm): NST Fundal Height: 35 cm Movement: Present     General:  Alert, oriented and cooperative. Patient is in no acute distress.  Skin: Skin is warm and dry. No rash noted.   Cardiovascular: Normal heart rate noted  Respiratory: Normal respiratory effort, no problems with respiration noted  Abdomen: Soft, gravid, appropriate for gestational age. Pain/Pressure: Present     Pelvic:  Cervical exam deferred        Extremities: Normal range of motion.     Mental Status: Normal mood and affect. Normal behavior. Normal judgment and thought content.   Assessment and Plan:  Pregnancy: [id number] at 4895w2d  1. Elderly multigravida with antepartum condition or complication Normal NIPS  2.  Supervision of high risk pregnancy in third trimester Patient is doing well without complaints She reports improvement in her sciatic nerve pain  3. Gestational diabetes mellitus (GDM) affecting pregnancy, antepartum Patient does not believe she is diabetic and desires to repeat testing. She is concerned about being bed ridden while in labor Discussed different options discussed as long as fetal status remains reassuring. Patient is agreeable She admits to not taking prescribed glyburide and has changed her det significantly All CBG values within range with one pp 150. Encouraged the patient to continue her current efforts NST reviewed and non reactive- baseline 130, min mod variability, no accels, spontaneous decels to 90's lasting 1 minute. Patient sent to MAU for prolonged monitoring and BPP Follow up growth ultrasound tomorrow  4. Grand multipara   Preterm labor symptoms and general obstetric precautions including but not limited to vaginal bleeding, contractions, leaking of fluid and fetal movement were reviewed in detail with the patient. Please refer to After Visit Summary for other counseling recommendations.  Return in about 1 week (around 03/01/2017) for ROB, .   Catalina AntiguaPeggy Jyoti Harju, MD

## 2017-02-22 NOTE — MAU Note (Signed)
Pt states Dr. Jolayne Pantheronstant sent her for further monitoring.  Pt had a stress test in the office today.  Good fetal movement.  Denies vaginal bleeding or ROM.

## 2017-02-22 NOTE — Progress Notes (Signed)
Pt states that she would like to repeat glucose testing. Pt states that she feels she does not have GDM.

## 2017-02-23 ENCOUNTER — Other Ambulatory Visit (HOSPITAL_COMMUNITY): Payer: Self-pay | Admitting: Maternal & Fetal Medicine

## 2017-02-23 ENCOUNTER — Ambulatory Visit (HOSPITAL_COMMUNITY)
Admission: RE | Admit: 2017-02-23 | Discharge: 2017-02-23 | Disposition: A | Discharge status: Home / Self Care | Payer: Medicaid Other | Source: Ambulatory Visit | Attending: Certified Nurse Midwife | Admitting: Certified Nurse Midwife

## 2017-02-23 ENCOUNTER — Encounter (HOSPITAL_COMMUNITY): Payer: Self-pay

## 2017-02-23 DIAGNOSIS — Z3A34 34 weeks gestation of pregnancy: Secondary | ICD-10-CM

## 2017-02-23 DIAGNOSIS — O24410 Gestational diabetes mellitus in pregnancy, diet controlled: Secondary | ICD-10-CM

## 2017-02-23 DIAGNOSIS — O09523 Supervision of elderly multigravida, third trimester: Secondary | ICD-10-CM

## 2017-02-23 DIAGNOSIS — O24419 Gestational diabetes mellitus in pregnancy, unspecified control: Secondary | ICD-10-CM

## 2017-02-23 DIAGNOSIS — O09293 Supervision of pregnancy with other poor reproductive or obstetric history, third trimester: Secondary | ICD-10-CM | POA: Diagnosis not present

## 2017-02-23 DIAGNOSIS — O0943 Supervision of pregnancy with grand multiparity, third trimester: Secondary | ICD-10-CM | POA: Insufficient documentation

## 2017-02-25 ENCOUNTER — Other Ambulatory Visit: Payer: Self-pay | Admitting: Obstetrics and Gynecology

## 2017-02-25 ENCOUNTER — Ambulatory Visit (INDEPENDENT_AMBULATORY_CARE_PROVIDER_SITE_OTHER): Payer: Medicaid Other | Admitting: Certified Nurse Midwife

## 2017-02-25 ENCOUNTER — Ambulatory Visit (HOSPITAL_COMMUNITY)
Admission: RE | Admit: 2017-02-25 | Discharge: 2017-02-25 | Disposition: A | Discharge status: Home / Self Care | Payer: Medicaid Other | Source: Ambulatory Visit | Attending: Obstetrics and Gynecology | Admitting: Obstetrics and Gynecology

## 2017-02-25 DIAGNOSIS — O24410 Gestational diabetes mellitus in pregnancy, diet controlled: Secondary | ICD-10-CM | POA: Insufficient documentation

## 2017-02-25 DIAGNOSIS — O0943 Supervision of pregnancy with grand multiparity, third trimester: Secondary | ICD-10-CM | POA: Diagnosis not present

## 2017-02-25 DIAGNOSIS — O24419 Gestational diabetes mellitus in pregnancy, unspecified control: Secondary | ICD-10-CM

## 2017-02-25 DIAGNOSIS — O09529 Supervision of elderly multigravida, unspecified trimester: Secondary | ICD-10-CM

## 2017-02-25 DIAGNOSIS — O288 Other abnormal findings on antenatal screening of mother: Secondary | ICD-10-CM

## 2017-02-25 DIAGNOSIS — O09523 Supervision of elderly multigravida, third trimester: Secondary | ICD-10-CM | POA: Diagnosis present

## 2017-02-25 DIAGNOSIS — Z3A34 34 weeks gestation of pregnancy: Secondary | ICD-10-CM | POA: Diagnosis not present

## 2017-02-25 DIAGNOSIS — O0990 Supervision of high risk pregnancy, unspecified, unspecified trimester: Secondary | ICD-10-CM

## 2017-02-25 DIAGNOSIS — O283 Abnormal ultrasonic finding on antenatal screening of mother: Secondary | ICD-10-CM | POA: Insufficient documentation

## 2017-02-25 DIAGNOSIS — O24913 Unspecified diabetes mellitus in pregnancy, third trimester: Secondary | ICD-10-CM | POA: Diagnosis not present

## 2017-02-25 DIAGNOSIS — O289 Unspecified abnormal findings on antenatal screening of mother: Secondary | ICD-10-CM

## 2017-02-25 MED ORDER — FLUCONAZOLE 150 MG PO TABS: 150.0000 mg | ORAL_TABLET | Freq: Once | ORAL | 0 refills | Status: AC

## 2017-02-25 MED ORDER — ZOLPIDEM TARTRATE 10 MG PO TABS: 10.0000 mg | ORAL_TABLET | Freq: Every evening | ORAL | 3 refills | Status: DC | PRN

## 2017-02-25 NOTE — Progress Notes (Signed)
2/22 NST reviewed and non reactive with baseline 140, mod variability, no accels, no decels  Patient sent for BPP

## 2017-02-25 NOTE — Progress Notes (Signed)
Patient presents for NST. Placed on machine. 

## 2017-03-02 ENCOUNTER — Other Ambulatory Visit (HOSPITAL_COMMUNITY)
Admission: RE | Admit: 2017-03-02 | Discharge: 2017-03-02 | Disposition: A | Discharge status: Home / Self Care | Payer: Medicaid Other | Source: Ambulatory Visit | Attending: Obstetrics & Gynecology | Admitting: Obstetrics & Gynecology

## 2017-03-02 ENCOUNTER — Ambulatory Visit (INDEPENDENT_AMBULATORY_CARE_PROVIDER_SITE_OTHER): Payer: Medicaid Other | Admitting: Obstetrics & Gynecology

## 2017-03-02 VITALS — BP 112/82 | Wt 272.0 lb

## 2017-03-02 DIAGNOSIS — O24913 Unspecified diabetes mellitus in pregnancy, third trimester: Secondary | ICD-10-CM | POA: Insufficient documentation

## 2017-03-02 DIAGNOSIS — O0993 Supervision of high risk pregnancy, unspecified, third trimester: Secondary | ICD-10-CM | POA: Diagnosis present

## 2017-03-02 DIAGNOSIS — O24419 Gestational diabetes mellitus in pregnancy, unspecified control: Secondary | ICD-10-CM

## 2017-03-02 LAB — OB RESULTS CONSOLE GBS: GBS: NEGATIVE

## 2017-03-02 MED ORDER — HYDROXYZINE PAMOATE 50 MG PO CAPS: 50.0000 mg | ORAL_CAPSULE | Freq: Four times a day (QID) | ORAL | 2 refills | Status: DC | PRN

## 2017-03-02 NOTE — Patient Instructions (Signed)

## 2017-03-02 NOTE — Progress Notes (Signed)
US 80%ile, NST reactive today   PRENATAL VISIT NOTE  Subjective:  Shelly Lane is a 41 y.o. [id number] at 3892w3d being seen today for ongoing prenatal care.  She is currently monitored for the following issues for this high-risk pregnancy and has OBESITY; ANXIETY; BULIMIA; DEPRESSION; SYMPTOM, PAIN, ABDOMINAL, EPIGASTRIC; Elderly multigravida with antepartum condition or complication; SAB (spontaneous abortion) x 4; Grand multipara; Hx of preterm delivery x 2, currently pregnant; Hx gestational diabetes x 2; Multiple drug allergies; History of abnormal cervical Pap smear; Supervision of high-risk pregnancy; Chronic pain disorder; Drug abuse; and Gestational diabetes mellitus (GDM) affecting pregnancy, antepartum on her problem list.  Patient reports no complaints.  Contractions: Irregular. Vag. Bleeding: None.  Movement: Present. Denies leaking of fluid.   The following portions of the patient's history were reviewed and updated as appropriate: allergies, current medications, past family history, past medical history, past social history, past surgical history and problem list. Problem list updated.  Objective:   Vitals:   03/02/17 1512  BP: 112/82  Weight: 272 lb (123.4 kg)    Fetal Status: Fetal Heart Rate (bpm): NST   Movement: Present     General:  Alert, oriented and cooperative. Patient is in no acute distress.  Skin: Skin is warm and dry. No rash noted.   Cardiovascular: Normal heart rate noted  Respiratory: Normal respiratory effort, no problems with respiration noted  Abdomen: Soft, gravid, appropriate for gestational age. Pain/Pressure: Present     Pelvic:  Cervical exam deferred        Extremities: Normal range of motion.     Mental Status: Normal mood and affect. Normal behavior. Normal judgment and thought content.   Assessment and Plan:  Pregnancy: [id number] at 4192w3d Patient Active Problem List   Diagnosis Date Noted  . Gestational diabetes mellitus (GDM) affecting  pregnancy, antepartum 01/05/2017  . Drug abuse 11/26/2016  . Chronic pain disorder 09/16/2016  . Supervision of high-risk pregnancy 09/08/2016  . History of abnormal cervical Pap smear 10/04/2013  . Elderly multigravida with antepartum condition or complication 02/13/2013  . SAB (spontaneous abortion) x 4 02/13/2013  . Grand multipara 02/13/2013  . Hx of preterm delivery x 2, currently pregnant 02/13/2013  . Hx gestational diabetes x 2 02/13/2013  . Multiple drug allergies 02/13/2013  . BULIMIA 10/21/2007  . ANXIETY 08/02/2007  . SYMPTOM, PAIN, ABDOMINAL, EPIGASTRIC 08/02/2007  . OBESITY 08/01/2007  . DEPRESSION 08/01/2007   States fasting and PP CBG wnl  Preterm labor symptoms and general obstetric precautions including but not limited to vaginal bleeding, contractions, leaking of fluid and fetal movement were reviewed in detail with the patient. Please refer to After Visit Summary for other counseling recommendations.  Return in about 1 week (around 03/09/2017).   Adam PhenixJames G Aerica Rincon, MD

## 2017-03-02 NOTE — Progress Notes (Signed)
Pt states she would like cervix check by female provider today.

## 2017-03-03 ENCOUNTER — Other Ambulatory Visit: Payer: Self-pay | Admitting: Certified Nurse Midwife

## 2017-03-03 DIAGNOSIS — B3731 Acute candidiasis of vulva and vagina: Secondary | ICD-10-CM

## 2017-03-03 DIAGNOSIS — B373 Candidiasis of vulva and vagina: Secondary | ICD-10-CM

## 2017-03-03 DIAGNOSIS — L2084 Intrinsic (allergic) eczema: Secondary | ICD-10-CM

## 2017-03-03 NOTE — Telephone Encounter (Signed)
Review for refill. 

## 2017-03-04 LAB — COMPREHENSIVE METABOLIC PANEL
ALT: 7 IU/L (ref 0–32)
AST: 13 IU/L (ref 0–40)
Albumin/Globulin Ratio: 1.5 (ref 1.2–2.2)
Albumin: 3.7 g/dL (ref 3.5–5.5)
Alkaline Phosphatase: 72 IU/L (ref 39–117)
BUN/Creatinine Ratio: 14 (ref 9–23)
BUN: 5 mg/dL — ABNORMAL LOW (ref 6–20)
Bilirubin Total: 0.4 mg/dL (ref 0.0–1.2)
CO2: 22 mmol/L (ref 18–29)
Calcium: 9.5 mg/dL (ref 8.7–10.2)
Chloride: 101 mmol/L (ref 96–106)
Creatinine, Ser: 0.37 mg/dL — ABNORMAL LOW (ref 0.57–1.00)
GFR calc Af Amer: 157 mL/min/{1.73_m2} (ref >59)
GFR calc non Af Amer: 136 mL/min/{1.73_m2} (ref >59)
Globulin, Total: 2.5 g/dL (ref 1.5–4.5)
Glucose: 81 mg/dL (ref 65–99)
Potassium: 4.5 mmol/L (ref 3.5–5.2)
Sodium: 138 mmol/L (ref 134–144)
Total Protein: 6.2 g/dL (ref 6.0–8.5)

## 2017-03-04 LAB — CERVICOVAGINAL ANCILLARY ONLY
Bacterial vaginitis: NEGATIVE
Candida vaginitis: NEGATIVE
Chlamydia: NEGATIVE
Neisseria Gonorrhea: NEGATIVE
Trichomonas: NEGATIVE

## 2017-03-04 LAB — BILE ACIDS, TOTAL: Bile Acids Total: 4.6 umol/L — ABNORMAL LOW (ref 4.7–24.5)

## 2017-03-04 LAB — STREP GP B NAA: Strep Gp B NAA: NEGATIVE

## 2017-03-05 ENCOUNTER — Ambulatory Visit (HOSPITAL_COMMUNITY)
Admission: RE | Admit: 2017-03-05 | Discharge: 2017-03-05 | Disposition: A | Discharge status: Home / Self Care | Payer: Medicaid Other | Source: Ambulatory Visit | Attending: Obstetrics and Gynecology | Admitting: Obstetrics and Gynecology

## 2017-03-05 DIAGNOSIS — O24410 Gestational diabetes mellitus in pregnancy, diet controlled: Secondary | ICD-10-CM | POA: Insufficient documentation

## 2017-03-05 DIAGNOSIS — O09523 Supervision of elderly multigravida, third trimester: Secondary | ICD-10-CM | POA: Diagnosis not present

## 2017-03-05 DIAGNOSIS — O24419 Gestational diabetes mellitus in pregnancy, unspecified control: Secondary | ICD-10-CM

## 2017-03-05 DIAGNOSIS — Z3A35 35 weeks gestation of pregnancy: Secondary | ICD-10-CM | POA: Diagnosis not present

## 2017-03-05 DIAGNOSIS — O0993 Supervision of high risk pregnancy, unspecified, third trimester: Secondary | ICD-10-CM | POA: Diagnosis not present

## 2017-03-05 DIAGNOSIS — O09529 Supervision of elderly multigravida, unspecified trimester: Secondary | ICD-10-CM

## 2017-03-06 ENCOUNTER — Inpatient Hospital Stay (HOSPITAL_COMMUNITY)
Admission: AD | Admit: 2017-03-06 | Discharge: 2017-03-06 | Disposition: A | Discharge status: Home / Self Care | Payer: Medicaid Other | Source: Ambulatory Visit | Attending: Obstetrics & Gynecology | Admitting: Obstetrics & Gynecology

## 2017-03-06 DIAGNOSIS — O24419 Gestational diabetes mellitus in pregnancy, unspecified control: Secondary | ICD-10-CM | POA: Insufficient documentation

## 2017-03-06 DIAGNOSIS — Z3A36 36 weeks gestation of pregnancy: Secondary | ICD-10-CM | POA: Diagnosis not present

## 2017-03-06 DIAGNOSIS — O4703 False labor before 37 completed weeks of gestation, third trimester: Secondary | ICD-10-CM | POA: Insufficient documentation

## 2017-03-06 DIAGNOSIS — O4700 False labor before 37 completed weeks of gestation, unspecified trimester: Secondary | ICD-10-CM

## 2017-03-06 LAB — URINALYSIS, ROUTINE W REFLEX MICROSCOPIC
Bilirubin Urine: NEGATIVE
Glucose, UA: NEGATIVE mg/dL
Hgb urine dipstick: NEGATIVE
Ketones, ur: NEGATIVE mg/dL
Leukocytes, UA: NEGATIVE
Nitrite: NEGATIVE
Protein, ur: NEGATIVE mg/dL
Specific Gravity, Urine: 1.01 (ref 1.005–1.030)
pH: 6 (ref 5.0–8.0)

## 2017-03-06 NOTE — MAU Note (Signed)
Presents to MAU because woke up around 0600 with diarrhea.  Contractions started shortly there after about 5-6 minutes apart. Denies LOF, Vaginal bleeding, reports + FM

## 2017-03-08 ENCOUNTER — Ambulatory Visit (INDEPENDENT_AMBULATORY_CARE_PROVIDER_SITE_OTHER): Payer: Medicaid Other | Admitting: Obstetrics & Gynecology

## 2017-03-08 ENCOUNTER — Ambulatory Visit (HOSPITAL_COMMUNITY)
Admission: RE | Admit: 2017-03-08 | Discharge: 2017-03-08 | Disposition: A | Discharge status: Home / Self Care | Payer: Medicaid Other | Source: Ambulatory Visit | Attending: Obstetrics & Gynecology | Admitting: Obstetrics & Gynecology

## 2017-03-08 ENCOUNTER — Encounter: Payer: Medicaid Other | Admitting: Certified Nurse Midwife

## 2017-03-08 ENCOUNTER — Inpatient Hospital Stay (HOSPITAL_COMMUNITY)
Admission: AD | Admit: 2017-03-08 | Discharge: 2017-03-08 | Disposition: A | Discharge status: Home / Self Care | Payer: Medicaid Other | Source: Ambulatory Visit | Attending: Obstetrics & Gynecology | Admitting: Obstetrics & Gynecology

## 2017-03-08 ENCOUNTER — Encounter (HOSPITAL_COMMUNITY): Payer: Self-pay | Admitting: *Deleted

## 2017-03-08 VITALS — BP 108/71 | HR 121 | Wt 276.2 lb

## 2017-03-08 DIAGNOSIS — O99343 Other mental disorders complicating pregnancy, third trimester: Secondary | ICD-10-CM | POA: Diagnosis not present

## 2017-03-08 DIAGNOSIS — O24419 Gestational diabetes mellitus in pregnancy, unspecified control: Secondary | ICD-10-CM | POA: Diagnosis not present

## 2017-03-08 DIAGNOSIS — O24410 Gestational diabetes mellitus in pregnancy, diet controlled: Secondary | ICD-10-CM

## 2017-03-08 DIAGNOSIS — O289 Unspecified abnormal findings on antenatal screening of mother: Secondary | ICD-10-CM | POA: Insufficient documentation

## 2017-03-08 DIAGNOSIS — L2084 Intrinsic (allergic) eczema: Secondary | ICD-10-CM

## 2017-03-08 DIAGNOSIS — O09523 Supervision of elderly multigravida, third trimester: Secondary | ICD-10-CM | POA: Insufficient documentation

## 2017-03-08 DIAGNOSIS — Z79899 Other long term (current) drug therapy: Secondary | ICD-10-CM | POA: Diagnosis not present

## 2017-03-08 DIAGNOSIS — O99613 Diseases of the digestive system complicating pregnancy, third trimester: Secondary | ICD-10-CM | POA: Insufficient documentation

## 2017-03-08 DIAGNOSIS — O0993 Supervision of high risk pregnancy, unspecified, third trimester: Secondary | ICD-10-CM

## 2017-03-08 DIAGNOSIS — Z3689 Encounter for other specified antenatal screening: Secondary | ICD-10-CM

## 2017-03-08 DIAGNOSIS — Z3A36 36 weeks gestation of pregnancy: Secondary | ICD-10-CM | POA: Insufficient documentation

## 2017-03-08 DIAGNOSIS — O09529 Supervision of elderly multigravida, unspecified trimester: Secondary | ICD-10-CM

## 2017-03-08 DIAGNOSIS — O24919 Unspecified diabetes mellitus in pregnancy, unspecified trimester: Secondary | ICD-10-CM | POA: Diagnosis present

## 2017-03-08 DIAGNOSIS — K219 Gastro-esophageal reflux disease without esophagitis: Secondary | ICD-10-CM | POA: Diagnosis not present

## 2017-03-08 DIAGNOSIS — F419 Anxiety disorder, unspecified: Secondary | ICD-10-CM | POA: Insufficient documentation

## 2017-03-08 DIAGNOSIS — O24913 Unspecified diabetes mellitus in pregnancy, third trimester: Secondary | ICD-10-CM

## 2017-03-08 DIAGNOSIS — O288 Other abnormal findings on antenatal screening of mother: Secondary | ICD-10-CM | POA: Diagnosis not present

## 2017-03-08 HISTORY — DX: Unspecified abnormal cytological findings in specimens from vagina: R87.629

## 2017-03-08 MED ORDER — CLOBETASOL PROPIONATE 0.05 % EX OINT: TOPICAL_OINTMENT | CUTANEOUS | 5 refills | Status: DC

## 2017-03-08 NOTE — Progress Notes (Signed)
   PRENATAL VISIT NOTE  Subjective:  Shelly Lane is a 41 y.o. [id number] at [redacted]w[redacted]d being seen today for ongoing prenatal care.  She is currently monitored for the following issues for this high-risk pregnancy and has OBESITY; ANXIETY; BULIMIA; DEPRESSION; SYMPTOM, PAIN, ABDOMINAL, EPIGASTRIC; Elderly multigravida with antepartum condition or complication; SAB (spontaneous abortion) x 4; Grand multipara; Hx of preterm delivery x 2, currently pregnant; Hx gestational diabetes x 2; Multiple drug allergies; History of abnormal cervical Pap smear; Supervision of high-risk pregnancy; Chronic pain disorder; Drug abuse; and Gestational diabetes mellitus (GDM) affecting pregnancy, antepartum on her problem list.  Patient reports backache.  Contractions: Irregular. Vag. Bleeding: None.  Movement: Present. Denies leaking of fluid.   The following portions of the patient's history were reviewed and updated as appropriate: allergies, current medications, past family history, past medical history, past social history, past surgical history and problem list. Problem list updated.  Objective:   Vitals:   03/08/17 1431  BP: 108/71  Pulse: (!) 121  Weight: 276 lb 3.2 oz (125.3 kg)    Fetal Status: Fetal Heart Rate (bpm): NST   Movement: Present  Presentation: Vertex  General:  Alert, oriented and cooperative. Patient is in no acute distress.  Skin: Skin is warm and dry. No rash noted.   Cardiovascular: Normal heart rate noted  Respiratory: Normal respiratory effort, no problems with respiration noted  Abdomen: Soft, gravid, appropriate for gestational age. Pain/Pressure: Present     Pelvic:  Cervical exam performed Dilation: 3 Effacement (%): 40 Station: Ballotable  Extremities: Normal range of motion.  Edema: None  Mental Status: Normal mood and affect. Normal behavior. Normal judgment and thought content.   NST performed today was reviewed and was found to be nonreactive.  BPP ordered STAT.    Assessment and Plan:  Pregnancy: [id number] at [redacted]w[redacted]d  1. Gestational diabetes mellitus (GDM) affecting pregnancy, antepartum Did not bring log, reports sugars within range.  On antenatal testing as glyburide was previously prescribed, and patient does not take.  If she is not taking medications, does not technically need to be getting antenatal testing (unless uncontrolled sugars are suspected).  However, she has persistent NRNST, I feel this is a good enough reason to continue testing for now.    2. Elderly multigravida with antepartum condition or complication  3. Intrinsic eczema Refilled Clobetasol as per her request - clobetasol ointment (TEMOVATE) 0.05 %; APPLY EXTERNALLY TO THE AFFECTED AREA TWICE DAILY  Dispense: 30 g; Refill: 5  4. Supervision of high risk pregnancy in third trimester Told to continue medications for back pain. Preterm labor symptoms and general obstetric precautions including but not limited to vaginal bleeding, contractions, leaking of fluid and fetal movement were reviewed in detail with the patient. Please refer to After Visit Summary for other counseling recommendations.  Return in about 1 week (around 03/15/2017) for OB Visit, NST.   Tereso Newcomer, MD

## 2017-03-08 NOTE — MAU Note (Signed)
Pt to MAU from U/S, BPP 4/8.  Was sent for an U/S from [facility] due to nonreactive NST in the office.  Pt C/O lower back pain & lower abd cramping since 1200, denies bleeding or LOF.

## 2017-03-08 NOTE — MAU Note (Signed)
Discharge instructions reviewed with patient by CNM, pt. Verbalized understanding, copy given, e-signature obtained.  Pt. Discharged with verbal understanding to follow up for BPP.

## 2017-03-08 NOTE — Patient Instructions (Signed)
Return to clinic for any scheduled appointments or obstetric concerns, or go to [facility] for evaluation  

## 2017-03-08 NOTE — MAU Provider Note (Signed)
Chief Complaint:  non reactive NST   First Provider Initiated Contact with Patient 03/08/17 1759      HPI: Shelly Lane is a 41 y.o. [id number] at 2w2dwho presents to maternity admissions because she was sent to MFM for BPP from the office and had nonreactive NST and BPP 4/8 today.  She reports she ate breakfast but then had appointment for her son, then OB appointment so did not eat lunch.  She is feeling normal fetal movement today.  She has not tried any treatments.  There are no associated symptoms.  She denies regular contractions but is having irregular cramping.  She denies LOF, vaginal bleeding, vaginal itching/burning, urinary symptoms, h/a, dizziness, n/v, or fever/chills.    HPI  Past Medical History: Past Medical History:  Diagnosis Date  . Abnormal Pap smear    Colpo;Last pap 2012, ASCUS w/ negative HPV  . Anemia    during preg  . Anxiety   . ASCUS with positive high risk HPV   . Depression    ok now  . Eczema   . GERD (gastroesophageal reflux disease)   . Gestational diabetes   . H/O candidiasis   . H/O migraine   . H/O rubella   . H/O varicella   . H/O: depression   . H/O: obesity   . Headache(784.0)   . Hx: UTI (urinary tract infection)   . Miscarriage    3/13 and 7/13   . Preterm labor   . Vaginal Pap smear, abnormal     Past obstetric history: OB History  Gravida Para Term Preterm AB Living  _0 SAB TAB Ectopic Multiple Live Births  3     0 8    # Outcome Date GA Lbr Len/2nd Weight Sex Delivery Anes PTL Lv  12 Current           11 Term 11/15/14 381w1d3:17 / 00:07 8 lb 6.2 oz (3.805 kg) F Vag-Spont Local  LIV  10 Term 10/05/13 3828w5d:10 / 00:02 6 lb 7.5 oz (2.934 kg) F Vag-Spont None  LIV  9 SAB 07/09/12 11w758w0d     8 SAB 02/07/12 58w0d35w0d       Birth Comments: System Generated. Please review and update pregnancy details.  7 Term 01/23/10 60w6d71w6d 6 lb 13.7 oz (3.11 kg) F Vag-Spont EPI  LIV     Birth Comments: System  Generated. Please review and update pregnancy details.  6 Preterm 02/08/09 [redacted]w[redacted]d 53w2d6 lb 9.5 oz (2.99 kg) F Vag-Spont   LIV  5 SAB 02/2007 [redacted]w[redacted]d   70w0dDEC  4 Preterm 02/21/04 [redacted]w[redacted]d 0510w2dlb 5 oz (2.863 kg) F Vag-Spont EPI  LIV  3 Term 08/2002 [redacted]w[redacted]d 21:1w0db 15 oz (4.054 kg) M Vag-Spont EPI  LIV  2 Term 04/1999 358w0d 05:074w0d 9 oz (3.43 kg) M Vag-Spont EPI  LIV  1 Term 11/1997 358w0d 12:301w0d6 oz (3.345 kg) F Vag-Spont EPI  LIV      Past Surgical History: Past Surgical History:  Procedure Laterality Date  . CHOLECYSTECTOMY  2008  . HERNIA REPAIR  2012  . HIATAL HERNIA REPAIR  2012  . WISDOM TOOTH EXTRACTION      Family History: Family History  Problem Relation Age of Onset  . Diabetes Maternal Grandmother   . Hypertension Maternal Grandmother   . Anemia Mother  Social History: Social History  Substance Use Topics  . Smoking status: Never Smoker  . Smokeless tobacco: Never Used  . Alcohol use No    Allergies:  Allergies  Allergen Reactions  . Vicodin [Hydrocodone-Acetaminophen] Itching    Meds:  Prescriptions Prior to Admission  Medication Sig Dispense Refill Last Dose  . ACCU-CHEK FASTCLIX LANCETS MISC 1 Units by Percutaneous route 4 (four) times daily. 100 each 12 Taking  . ALFALFA PO Take 1 capsule by mouth 3 (three) times daily.    03/05/2017 at Unknown time  . Blood Glucose Monitoring Suppl (ACCU-CHEK NANO SMARTVIEW) w/Device KIT 1 kit by Subdermal route as directed. Check blood sugars for fasting, and two hours after breakfast, lunch and dinner (4 checks daily) 1 kit 0 Taking  . clobetasol ointment (TEMOVATE) 0.05 % APPLY EXTERNALLY TO THE AFFECTED AREA TWICE DAILY 30 g 5   . cyclobenzaprine (FLEXERIL) 10 MG tablet Take 1 tablet by mouth three times daily as needed for muscle spasms  2 Taking  . fluticasone (FLONASE) 50 MCG/ACT nasal spray Place 2 sprays into both nostrils daily.   Past Month at Unknown time  . gabapentin (NEURONTIN) 300 MG capsule  Take 1 capsule (300 mg total) by mouth 3 (three) times daily. 90 capsule 5 Taking  . glucose blood (ACCU-CHEK SMARTVIEW) test strip Use as instructed to check blood sugars 100 each 12 Taking  . hydrOXYzine (VISTARIL) 50 MG capsule Take 1 capsule (50 mg total) by mouth every 6 (six) hours as needed. 45 capsule 2   . loratadine (CLARITIN) 10 MG tablet Take 10 mg by mouth daily.   Taking  . oxyCODONE-acetaminophen (PERCOCET) 7.5-325 MG tablet Take 1 tablet by mouth every 8 (eight) hours as needed for severe pain.   03/06/2017 at Unknown time  . Prenatal-Fe Fum-Methf-FA w/o A (VITAFOL-NANO) 18-0.6-0.4 MG TABS Take 1 tablet by mouth daily. 30 tablet 12 Taking  . terconazole (TERAZOL 7) 0.4 % vaginal cream PLACE 1 APPLICATORFUL VAGINALLY AT BEDTIME FOR 7 DAYS (Patient not taking: Reported on 03/06/2017) 45 g 0 Not Taking at Unknown time  . zolpidem (AMBIEN) 10 MG tablet Take 1 tablet (10 mg total) by mouth at bedtime as needed for sleep. 30 tablet 3 Not Taking at Unknown time    ROS:  Review of Systems  Constitutional: Negative for chills, fatigue and fever.  Eyes: Negative for visual disturbance.  Respiratory: Negative for shortness of breath.   Cardiovascular: Negative for chest pain.  Gastrointestinal: Negative for abdominal pain, nausea and vomiting.  Genitourinary: Negative for difficulty urinating, dysuria, flank pain, pelvic pain, vaginal bleeding, vaginal discharge and vaginal pain.  Neurological: Negative for dizziness and headaches.  Psychiatric/Behavioral: Negative.      I have reviewed patient's Past Medical Hx, Surgical Hx, Family Hx, Social Hx, medications and allergies.   Physical Exam   Patient Vitals for the past 24 hrs:  BP Temp Temp src Pulse Resp SpO2 Height Weight  03/08/17 1900 - - - (!) 102 - 98 % - -  03/08/17 1800 - - - (!) 113 - 97 % - -  03/08/17 1730 - - - (!) 112 - 98 % - -  03/08/17 1721 128/74 97.5 F (36.4 C) Oral (!) 108 20 - _0  (1.676 m) 276 lb (125.2 kg)    Constitutional: Well-developed, well-nourished female in no acute distress.  Cardiovascular: normal rate Respiratory: normal effort GI: Abd soft, non-tender, gravid appropriate for gestational age.  MS: Extremities nontender, no edema, normal ROM  Neurologic: Alert and oriented x 4.  GU: Neg CVAT.     FHT:  Baseline 145 , moderate variability, accelerations present, no decelerations Contractions: None on toco or to palpation   Labs: No results found for this or any previous visit (from the past 24 hour(s)). O/Positive/-- (10/03 1704)  Imaging:  [place] Mfm Fetal Bpp Wo Non Stress  Result Date: 03/08/2017 ----------------------------------------------------------------------  OBSTETRICS REPORT                      (Signed Final 03/08/2017 05:55 pm) ---------------------------------------------------------------------- Patient Info  ID #:       [id number]                         D.O.B.:   1976/03/13 (40 yrs)  Name:       Shelly Lane             Visit Date:  03/08/2017 04:52 pm ---------------------------------------------------------------------- Performed By  Performed By:     Raquel James         Ref. Address:     [facility]                                                             [place], [place]  Attending:        Renella Cunas MD       Secondary Phy.:   MAU Nursing-  MAU/Triage  Referred By:      [facility]       Location:         [facility] for                    [place] ---------------------------------------------------------------------- Orders   #   Description                                 Code   1  [place] MFM FETAL BPP WO NON STRESS              40086.76  ----------------------------------------------------------------------   #  Ordered By               Order #        Accession #    Episode #   1  Verita Schneiders           [id number]      [id number]     [id number]  ---------------------------------------------------------------------- Indications   [redacted] weeks gestation of pregnancy                Z3A.36   Non-reactive NST                               O28.9   Gestational diabetes in pregnancy, diet        O24.410   controlled (pt dc'ed glyburide)   Advanced maternal age multigravida 37+,        O72.523   third trimester; low risk NIPS  ---------------------------------------------------------------------- OB History  Blood Type:            Height:  5'7"   Weight (lb):  258      BMI:   40.4  Gravidity:    12        Term:   6        Prem:   2        SAB:   3  Living:       8 ---------------------------------------------------------------------- Fetal Evaluation  Num Of Fetuses:     1  Fetal Heart         153  Rate(bpm):  Cardiac Activity:   Observed  Presentation:       Cephalic  Placenta:           Anterior, above cervical os  Amniotic Fluid  AFI FV:      Subjectively within normal limits  AFI Sum(cm)     %Tile       Largest Pocket(cm)  10.34           25          4.34                RLQ(cm)       LUQ(cm)        LLQ(cm)                3.52          2.48           4.34 ---------------------------------------------------------------------- Biophysical Evaluation  Amniotic F.V:   Within normal limits  F. Tone:        Not Observed  F. Movement:    Not Observed               Score:          4/8  F. Breathing:   Observed ---------------------------------------------------------------------- Gestational Age  Best:          36w 2d    Det. By:   Loman Chroman         EDD:   04/03/17                                      (08/08/16)  ---------------------------------------------------------------------- Impression  SIUP at 09+2 weeks  Cephalic presentation  Normal amniotic fluid volume  BPP 4/8 (-2 for tone and breathing movement) ---------------------------------------------------------------------- Recommendations  Pt was sent to MAU ----------------------------------------------------------------------                 Renella Cunas, MD Electronically Signed Final Report   03/08/2017 05:55 pm ----------------------------------------------------------------------  [place] Mfm Fetal Bpp Wo Non Stress  Result Date: 03/05/2017 ----------------------------------------------------------------------  OBSTETRICS REPORT                      (Signed Final 03/05/2017 04:37 pm) ---------------------------------------------------------------------- Patient Info  ID #:       [id number]                          D.O.B.:  November 29, 1976 (40 yrs)  Name:       Shelly Lane              Visit Date: 03/05/2017 04:08 pm ---------------------------------------------------------------------- Performed By  Performed By:     Valda Favia          Ref. Address:     [facility]                                                             [place], [place]  [place]  Attending:        Jolyn Lent MD      Secondary Phy.:   MAU Nursing-                                                             MAU/Triage  Referred By:      [facility]       Location:         [facility] for                    [place] ---------------------------------------------------------------------- Orders   #  Description                                  Code   1  [place] MFM FETAL BPP WO NON STRESS              37169.67  ----------------------------------------------------------------------   #  Ordered By               Order #        Accession #    Episode #   1  [place]           [id number]      [id number]     [id number]  ---------------------------------------------------------------------- Indications   [redacted] weeks gestation of pregnancy                Z3A.98   Advanced maternal age multigravida 69+,        O3.523   third trimester; low risk NIPS   Grand multiparity, antepartum                  O09.40   Poor obstetric history: Previous gestational   O09.299   diabetes   Gestational diabetes in pregnancy, diet        O24.410   controlled  ---------------------------------------------------------------------- OB History  Blood Type:            Height:  5'7"   Weight (lb):  258       BMI:  40.4  Gravidity:    12        Term:   6        Prem:   2        SAB:   3  Living:       8 ---------------------------------------------------------------------- Fetal Evaluation  Num Of Fetuses:     1  Fetal Heart         124  Rate(bpm):  Cardiac Activity:   Observed  Presentation:       Cephalic  Amniotic Fluid  AFI FV:      Subjectively within normal limits  AFI Sum(cm)     %Tile       Largest Pocket(cm)  12.01           37          4.11  RUQ(cm)       RLQ(cm)  LUQ(cm)        LLQ(cm)  0             3.96          3.94           4.11 ---------------------------------------------------------------------- Biophysical Evaluation  Amniotic F.V:   Within normal limits       F. Tone:        Observed  F. Movement:    Observed                   Score:          8/8  F. Breathing:   Observed ---------------------------------------------------------------------- Gestational Age  Best:          35w 6d     Det. ByLoman Chroman         EDD:   04/03/17                                      (08/08/16) ----------------------------------------------------------------------  Impression  Single living intrauterine pregnancy at 35 weeks 6 days.  Normal amniotic fluid volume.  BPP 8/8. ---------------------------------------------------------------------- Recommendations  Continue antenatal testing as scheduled. ----------------------------------------------------------------------                 Jolyn Lent, MD Electronically Signed Final Report   03/05/2017 04:37 pm ----------------------------------------------------------------------  [place] Mfm Fetal Bpp Wo Non Stress  Result Date: 02/25/2017 ----------------------------------------------------------------------  OBSTETRICS REPORT                      (Signed Final 02/25/2017 04:45 pm) ---------------------------------------------------------------------- Patient Info  ID #:       [id number]                          D.O.B.:  [birth year] (40 yrs)  Name:       Shelly Lane              Visit Date: 02/25/2017 04:34 pm ---------------------------------------------------------------------- Performed By  Performed By:     Elisabeth Cara        Ref. Address:     [facility]                                                             [place]  [place], [place]  Attending:        Abram Sander MD         Secondary Phy.:   MAU Nursing-                                                             MAU/Triage  Referred By:      [facility]       Location:         [facility] for                    [place] ---------------------------------------------------------------------- Orders   #  Description                                 Code   1  [place] MFM FETAL BPP WO NON STRESS               24268.34  ----------------------------------------------------------------------   #  Ordered By               Order #        Accession #    Episode #   1  [place]           [id number]      [id number]     [id number]  ---------------------------------------------------------------------- Indications   [redacted] weeks gestation of pregnancy                Z3A.57   Advanced maternal age multigravida 38+,        O33.523   third trimester; low risk NIPS   Grand multiparity, antepartum                  O09.40   Poor obstetric history: Previous gestational   O09.299   diabetes   Gestational diabetes in pregnancy, diet        O24.410   controlled   Non-reactive NST in office                     O28.9  ---------------------------------------------------------------------- OB History  Blood Type:            Height:  5'7"   Weight (lb):  258       BMI:  40.4  Gravidity:    12        Term:   6        Prem:   2        SAB:   3  Living:       8 ---------------------------------------------------------------------- Fetal Evaluation  Num Of Fetuses:     1  Fetal Heart  144  Rate(bpm):  Cardiac Activity:   Observed  Presentation:       Cephalic  Amniotic Fluid  AFI FV:      Subjectively within normal limits  AFI Sum(cm)     %Tile       Largest Pocket(cm)  13.45           46          4.72  RUQ(cm)       RLQ(cm)       LUQ(cm)        LLQ(cm)  3.06          2             4.72           3.67 ---------------------------------------------------------------------- Biophysical Evaluation  Amniotic F.V:   Pocket => 2 cm two         F. Tone:        Observed                  planes  F. Movement:    Observed                   Score:          8/8  F. Breathing:   Observed ---------------------------------------------------------------------- Gestational Age  Best:          34w 5d     Det. ByLoman Chroman         EDD:   04/03/17                                      (08/08/16)  ---------------------------------------------------------------------- Impression  Single living intrauterine pregnancy at [redacted]w[redacted]d  Normal amniotic fluid volume.  BPP 8/8. ---------------------------------------------------------------------- Recommendations  Continue antenatal testing as scheduled. ----------------------------------------------------------------------                   EAbram Sander MD Electronically Signed Final Report   02/25/2017 04:45 pm ----------------------------------------------------------------------  [place] Fetal Bpp Wo Non Stress  Result Date: 02/23/2017 ----------------------------------------------------------------------  OBSTETRICS REPORT                      (Signed Final 02/23/2017 08:00 am) ---------------------------------------------------------------------- Patient Info  ID #:       [id number]                         D.O.B.:  020-Mar-1977(40 yrs)  Name:       LCurlene Lane             Visit Date: 02/22/2017 04:38 pm ---------------------------------------------------------------------- Performed By  Performed By:     KJeanene ErbBS,      Ref. Address:     [facility]  [place], [facility]  Attending:        Griffin Dakin MD         Secondary Phy.:   MAU Nursing-                                                             MAU/Triage  Referred By:      [facility]       Location:         [facility] for                    [place] ---------------------------------------------------------------------- Orders   #  Description                                  Code   1  [place] MFM FETAL BPP WO NON STRESS              75449.20  ----------------------------------------------------------------------   #  Ordered By               Order #        Accession #    Episode #   1  Noni Saupe           [id number]      [id number]     [id number]  ---------------------------------------------------------------------- Indications   [redacted] weeks gestation of pregnancy                Z3A.81   Advanced maternal age multigravida 48+,        O47.523   third trimester   Grand multiparity, antepartum                  O09.40   Poor obstetric history: Previous gestational   O09.299   diabetes   Gestational diabetes in pregnancy, diet        O24.410   controlled   Non-reactive NST in office  O28.9  ---------------------------------------------------------------------- OB History  Blood Type:            Height:  5'7"   Weight (lb):  258       BMI:  40.4  Gravidity:    12        Term:   6        Prem:   2        SAB:   3  Living:       8 ---------------------------------------------------------------------- Fetal Evaluation  Num Of Fetuses:     1  Fetal Heart         136  Rate(bpm):  Cardiac Activity:   Observed  Presentation:       Cephalic  Amniotic Fluid  AFI FV:      Subjectively within normal limits  AFI Sum(cm)     %Tile       Largest Pocket(cm)  9.6             16          3.45  RUQ(cm)       RLQ(cm)       LUQ(cm)        LLQ(cm)  3.03          3.45          1.54           1.58 ---------------------------------------------------------------------- Biophysical Evaluation  Amniotic F.V:   Pocket => 2 cm two         F. Tone:        Observed                  planes  F. Movement:    Observed                   Score:          8/8  F. Breathing:   Observed ---------------------------------------------------------------------- Gestational Age  Best:          34w 2d     Det. ByLoman Chroman         EDD:   04/03/17                                      (08/08/16)  ---------------------------------------------------------------------- Impression  IUP at 34+2 weeks with GDM; here for BPP for nonreactive  NST  Normal fetal movement and cardiac activity  Normal amniotic fluid  BPP 8/8 ---------------------------------------------------------------------- Recommendations  Follow-up ultrasounds as clinically indicated. ----------------------------------------------------------------------                 Griffin Dakin, MD Electronically Signed Final Report   02/23/2017 08:00 am ----------------------------------------------------------------------  [place] Mfm Ob Follow Up  Result Date: 02/23/2017 ----------------------------------------------------------------------  OBSTETRICS REPORT                      (Signed Final 02/23/2017 04:59 pm) ---------------------------------------------------------------------- Patient Info  ID #:       [id number]                         D.O.B.:   [birth year] (40 yrs)  Name:       Shelly Lane             Visit Date:  02/23/2017 04:40 pm ---------------------------------------------------------------------- Performed By  Performed By:     Elisabeth Cara  Ref. Address:     [facility]                                                             [place]                                                             [place], [place]  Attending:        Renella Cunas MD       Secondary Phy.:   MAU Nursing-                                                             MAU/Triage  Referred By:      [facility]       Location:         [facility] for                    [place] ---------------------------------------------------------------------- Orders   #   Description                                 Code   1  [place] MFM OB FOLLOW UP                         [id number]  ----------------------------------------------------------------------   #  Ordered By  Order #        Accession #    Episode #   1  Abram Sander              [id number]      [id number]     [id number]  ---------------------------------------------------------------------- Indications   [redacted] weeks gestation of pregnancy                Z3A.43   Advanced maternal age multigravida 74+,        O71.523   third trimester; low risk NIPS   Grand multiparity, antepartum                  O09.40   Poor obstetric history: Previous gestational   O09.299   diabetes   Gestational diabetes in pregnancy, diet        O24.410   controlled  ---------------------------------------------------------------------- OB History  Blood Type:            Height:  5'7"   Weight (lb):  258      BMI:   40.4  Gravidity:    12        Term:   6        Prem:   2        SAB:   3  Living:       8 ---------------------------------------------------------------------- Fetal Evaluation  Num Of Fetuses:     1  Fetal Heart         146  Rate(bpm):  Cardiac Activity:   Observed  Presentation:       Cephalic  Placenta:           Anterior, above cervical os  Amniotic Fluid  AFI FV:      Subjectively within normal limits  AFI Sum(cm)     %Tile       Largest Pocket(cm)  13.34           44          4.05  RUQ(cm)       RLQ(cm)       LUQ(cm)        LLQ(cm)  4.05          2.85          2.6            3.84 ---------------------------------------------------------------------- Biometry  BPD:        86  mm     G. Age:  34w 5d         57  %    CI:        69.89   %   70 - 86                                                          FL/HC:      21.7   %   19.4 - 21.8  HC:      328.2  mm     G. Age:  37w 2d         83  %    HC/AC:      1.05       0.96 - 1.11  AC:      313.3  mm     G. Age:  70w 2d  77  %    FL/BPD:     82.9   %   71 - 87  FL:       71.3  mm      G. Age:  36w 4d         89  %    FL/AC:      22.8   %   20 - 24  HUM:        63  mm     G. Age:  36w 4d       > 95  %  Est. FW:    2759  gm      6 lb 1 oz     81  % ---------------------------------------------------------------------- Gestational Age  U/S Today:     36w 0d                                        EDD:   03/23/17  Best:          34w 3d    Det. ByLoman Chroman         EDD:   04/03/17                                      (08/08/16) ---------------------------------------------------------------------- Anatomy  Cranium:               Previously seen        Aortic Arch:            Previously seen  Cavum:                 Previously seen        Ductal Arch:            Previously seen  Ventricles:            Previously seen        Diaphragm:              Previously seen  Choroid Plexus:        Previously seen        Stomach:                Appears normal, left                                                                        sided  Cerebellum:            Previously seen        Abdomen:                Appears normal  Posterior Fossa:       Previously seen        Abdominal Wall:         Previously seen  Nuchal Fold:           Not applicable (>84    Cord Vessels:           Previously seen  wks GA)  Face:                  Orbits and profile     Kidneys:                Appear normal                         previously seen  Lips:                  Previously seen        Bladder:                Appears normal  Thoracic:              Appears normal         Spine:                  Limited views                                                                        previously seen  Heart:                 Appears normal         Upper Extremities:      Previously seen                         (4CH, axis, and situs  RVOT:                  Appears normal         Lower Extremities:      Previously seen  LVOT:                  Appears normal  Other:  Female gender visualized.  Heels and  5th digit previously visualized.          Nasal bone previously visualized. ---------------------------------------------------------------------- Cervix Uterus Adnexa  Cervix  Not visualized (advanced GA >29wks)  Uterus  No abnormality visualized.  Left Ovary  Not visualized.  Right Ovary  Not visualized.  Adnexa:       No abnormality visualized. No adnexal mass                visualized. ---------------------------------------------------------------------- Impression  SIUP at 34+3 weeks  Normal interval anatomy; anatomic survey complete  Normal amniotic fluid volume  Appropriate interval growth with EFW at the 81st %tile ---------------------------------------------------------------------- Recommendations  Follow-up as clinically indicated ----------------------------------------------------------------------                 Renella Cunas, MD Electronically Signed Final Report   02/23/2017 04:59 pm ----------------------------------------------------------------------   MAU Course/MDM: I have ordered labs and reviewed results.  NST reviewed, initially pt had 1 accel in 20 minutes then moderate variability with no accels but after snack in MAU pt had several 15x15 accels. Consult Roselie Awkward with presentation, exam findings and test results.  D/C home with precautions, pt to f/u in MFM for BPP tomorrow U/A and UDS ordered but urine not collected prior to discharge Treatments in MAU included PO hydration and food.   Pt stable at time of discharge.  Assessment: 1. NST (non-stress test) nonreactive   2. Gestational diabetes mellitus (GDM) affecting pregnancy, antepartum   3. NST (non-stress test) reactive     Plan: Discharge home Labor precautions and fetal kick counts  Follow-up [place] Follow up.   Specialty:  Maternal and Fetal Medicine Why:  MFM will call you with appointment for [place], or call to schedule.   Contact information: [address] [phone] [facility] [place] Follow up.   Why:  As scheduled, return to MAU as needed for emergencies. Contact information: [address] [place] [address] [phone]         Allergies as of 03/08/2017      Reactions   Vicodin [hydrocodone-acetaminophen] Itching      Medication List    TAKE these medications   ACCU-CHEK FASTCLIX LANCETS Misc 1 Units by Percutaneous route 4 (four) times daily.   ACCU-CHEK NANO SMARTVIEW w/Device Kit 1 kit by Subdermal route as directed. Check blood sugars for fasting, and two hours after breakfast, lunch and dinner (4 checks daily)   ALFALFA PO Take 1 capsule by mouth 3 (three) times daily.   clobetasol ointment 0.05 % Commonly known as:  TEMOVATE APPLY EXTERNALLY TO THE AFFECTED AREA TWICE DAILY   cyclobenzaprine 10 MG tablet Commonly known as:  FLEXERIL Take 1 tablet by mouth three times daily as needed for muscle spasms   fluticasone 50 MCG/ACT nasal spray Commonly known as:  FLONASE Place 2 sprays into both nostrils daily.   gabapentin 300 MG capsule Commonly known as:  NEURONTIN Take 1 capsule (300 mg total) by mouth 3 (three) times daily.   glucose blood test strip Commonly known as:  ACCU-CHEK SMARTVIEW Use as instructed to check blood sugars   hydrOXYzine 50 MG capsule Commonly known as:  VISTARIL Take 1 capsule (50 mg total) by mouth every 6 (six) hours as needed.   loratadine 10 MG tablet Commonly known as:  CLARITIN Take 10 mg by mouth daily.   oxyCODONE-acetaminophen 7.5-325 MG tablet Commonly known as:  PERCOCET Take 1 tablet by mouth every 8 (eight) hours as needed for severe pain.   terconazole 0.4 % vaginal cream Commonly known as:  TERAZOL 7 PLACE 1 APPLICATORFUL VAGINALLY AT BEDTIME FOR 7 DAYS   VITAFOL-NANO 18-0.6-0.4 MG Tabs Take 1 tablet by mouth daily.   zolpidem 10 MG tablet Commonly known  as:  AMBIEN Take 1 tablet (10 mg total) by mouth at bedtime as needed for sleep.       Fatima Blank Certified Nurse-Midwife 03/08/2017 7:18 PM

## 2017-03-09 ENCOUNTER — Inpatient Hospital Stay (HOSPITAL_COMMUNITY)
Admission: AD | Admit: 2017-03-09 | Discharge: 2017-03-10 | Disposition: A | Discharge status: Home / Self Care | Payer: Medicaid Other | Source: Ambulatory Visit | Attending: Family Medicine | Admitting: Family Medicine

## 2017-03-09 ENCOUNTER — Ambulatory Visit (HOSPITAL_COMMUNITY)
Admission: RE | Admit: 2017-03-09 | Discharge: 2017-03-09 | Disposition: A | Discharge status: Home / Self Care | Payer: Medicaid Other | Source: Ambulatory Visit | Attending: Advanced Practice Midwife | Admitting: Advanced Practice Midwife

## 2017-03-09 ENCOUNTER — Encounter (HOSPITAL_COMMUNITY): Payer: Self-pay

## 2017-03-09 DIAGNOSIS — O09523 Supervision of elderly multigravida, third trimester: Secondary | ICD-10-CM | POA: Diagnosis not present

## 2017-03-09 DIAGNOSIS — O24410 Gestational diabetes mellitus in pregnancy, diet controlled: Secondary | ICD-10-CM | POA: Diagnosis not present

## 2017-03-09 DIAGNOSIS — Z3A36 36 weeks gestation of pregnancy: Secondary | ICD-10-CM | POA: Insufficient documentation

## 2017-03-09 DIAGNOSIS — O368130 Decreased fetal movements, third trimester, not applicable or unspecified: Secondary | ICD-10-CM | POA: Diagnosis not present

## 2017-03-09 DIAGNOSIS — O4103X0 Oligohydramnios, third trimester, not applicable or unspecified: Secondary | ICD-10-CM | POA: Insufficient documentation

## 2017-03-09 DIAGNOSIS — O4100X0 Oligohydramnios, unspecified trimester, not applicable or unspecified: Secondary | ICD-10-CM

## 2017-03-09 DIAGNOSIS — O368120 Decreased fetal movements, second trimester, not applicable or unspecified: Secondary | ICD-10-CM

## 2017-03-09 DIAGNOSIS — Z0371 Encounter for suspected problem with amniotic cavity and membrane ruled out: Secondary | ICD-10-CM

## 2017-03-09 DIAGNOSIS — Z3689 Encounter for other specified antenatal screening: Secondary | ICD-10-CM | POA: Insufficient documentation

## 2017-03-09 DIAGNOSIS — O288 Other abnormal findings on antenatal screening of mother: Secondary | ICD-10-CM | POA: Insufficient documentation

## 2017-03-09 LAB — URINALYSIS, ROUTINE W REFLEX MICROSCOPIC
Bilirubin Urine: NEGATIVE
Glucose, UA: NEGATIVE mg/dL
Ketones, ur: NEGATIVE mg/dL
Nitrite: NEGATIVE
Protein, ur: NEGATIVE mg/dL
Specific Gravity, Urine: 1.005 (ref 1.005–1.030)
pH: 7 (ref 5.0–8.0)

## 2017-03-09 NOTE — MAU Note (Signed)
Pt arrived with c/o of decreased fetal movement over last 5 hours. Had BPP and states that she passed. States she has felt dampness and  Wetness in panties all day. States mild cramping, no vaginal bleeding, but thinks she just passed her mucous plug.

## 2017-03-09 NOTE — MAU Provider Note (Signed)
Chief Complaint  Patient presents with  . decreases fetal movement  . ? SROM     First Provider Initiated Contact with Patient 03/09/17 2324      S: Shelly Lane  is a 41 y.o. y.o. year old [id number] female at [redacted]w[redacted]d weeks gestation who presents to MAU reporting decreased fetal mvmt and leaking of scant clear fluid since 0600. Unsure if she was leaking fluid or sweating. Also lost mucus plug this evening. Had BPP earlier today that was 8/8 performed as follow-up for NRNST BPP 4/8 yesterday. Oligohydramnios Dx'd. A1GDM.   Contractions: Rare, mild Vaginal bleeding: None Fetal movement: Decreased  O:  Patient Vitals for the past 24 hrs:  BP Temp Temp src Pulse Resp SpO2  03/10/17 0053 115/66 97.9 F (36.6 C) Oral (!) 112 20 96 %  03/09/17 2323 - - - (!) 114 - 98 %  03/09/17 2318 - - - (!) 116 - 98 %  03/09/17 2313 - - - (!) 122 - 98 %  03/09/17 2308 116/83 98.5 F (36.9 C) Oral (!) 118 18 -   General: NAD Heart: Mild tachycardia Lungs: Normal rate and effort Abd: Soft, NT, Gravid, S=D Pelvic: NEFG, Neg pooling, thick mucus visualized, no blood.  Dilation: 3 Effacement (%): Thick Exam by:: Dorathy Kinsman CNM  EFM: 145, reactive Toco: None  Neg Fern Neg Amnisure  A: [redacted]w[redacted]d week IUP Pos Fetal mvmt in MAU Oligohydramnios w/ no evidence of SROM or labor FHR reactive  P: Discharge home in stable condition. Labor precautions and fetal kick counts. Follow-up repeat BPP 03/12/17 in MFM. Will induce at 37 weeks if Oligo still present. Return to maternity admissions as needed if symptoms worsen.  Morrison, CNM 03/10/2017 1:08 AM  2

## 2017-03-10 ENCOUNTER — Other Ambulatory Visit (HOSPITAL_COMMUNITY): Payer: Self-pay | Admitting: *Deleted

## 2017-03-10 DIAGNOSIS — O368120 Decreased fetal movements, second trimester, not applicable or unspecified: Secondary | ICD-10-CM

## 2017-03-10 DIAGNOSIS — O288 Other abnormal findings on antenatal screening of mother: Secondary | ICD-10-CM

## 2017-03-10 DIAGNOSIS — O368130 Decreased fetal movements, third trimester, not applicable or unspecified: Secondary | ICD-10-CM | POA: Diagnosis not present

## 2017-03-10 DIAGNOSIS — Z0371 Encounter for suspected problem with amniotic cavity and membrane ruled out: Secondary | ICD-10-CM

## 2017-03-10 DIAGNOSIS — O4100X0 Oligohydramnios, unspecified trimester, not applicable or unspecified: Secondary | ICD-10-CM

## 2017-03-10 LAB — AMNISURE RUPTURE OF MEMBRANE (ROM) NOT AT ARMC: Amnisure ROM: NEGATIVE

## 2017-03-12 ENCOUNTER — Inpatient Hospital Stay (HOSPITAL_COMMUNITY)
Admission: AD | Admit: 2017-03-12 | Discharge: 2017-03-12 | Disposition: A | Discharge status: Home / Self Care | Payer: Medicaid Other | Source: Ambulatory Visit | Attending: Obstetrics & Gynecology | Admitting: Obstetrics & Gynecology

## 2017-03-12 ENCOUNTER — Ambulatory Visit (HOSPITAL_COMMUNITY)
Admit: 2017-03-12 | Discharge: 2017-03-12 | Disposition: A | Discharge status: Home / Self Care | Payer: Medicaid Other | Attending: Advanced Practice Midwife | Admitting: Advanced Practice Midwife

## 2017-03-12 ENCOUNTER — Encounter (HOSPITAL_COMMUNITY): Payer: Self-pay | Admitting: *Deleted

## 2017-03-12 ENCOUNTER — Ambulatory Visit (HOSPITAL_COMMUNITY)
Admission: RE | Admit: 2017-03-12 | Discharge: 2017-03-12 | Disposition: A | Discharge status: Home / Self Care | Payer: Medicaid Other | Source: Ambulatory Visit | Attending: Advanced Practice Midwife | Admitting: Advanced Practice Midwife

## 2017-03-12 ENCOUNTER — Encounter (HOSPITAL_COMMUNITY): Payer: Self-pay

## 2017-03-12 VITALS — BP 106/66 | HR 120 | Wt 278.2 lb

## 2017-03-12 DIAGNOSIS — O99213 Obesity complicating pregnancy, third trimester: Secondary | ICD-10-CM | POA: Diagnosis not present

## 2017-03-12 DIAGNOSIS — O288 Other abnormal findings on antenatal screening of mother: Secondary | ICD-10-CM

## 2017-03-12 DIAGNOSIS — Z79899 Other long term (current) drug therapy: Secondary | ICD-10-CM | POA: Insufficient documentation

## 2017-03-12 DIAGNOSIS — O24419 Gestational diabetes mellitus in pregnancy, unspecified control: Secondary | ICD-10-CM

## 2017-03-12 DIAGNOSIS — O26893 Other specified pregnancy related conditions, third trimester: Secondary | ICD-10-CM | POA: Insufficient documentation

## 2017-03-12 DIAGNOSIS — M549 Dorsalgia, unspecified: Secondary | ICD-10-CM | POA: Insufficient documentation

## 2017-03-12 DIAGNOSIS — Z3A36 36 weeks gestation of pregnancy: Secondary | ICD-10-CM | POA: Insufficient documentation

## 2017-03-12 DIAGNOSIS — G8929 Other chronic pain: Secondary | ICD-10-CM | POA: Insufficient documentation

## 2017-03-12 DIAGNOSIS — Z885 Allergy status to narcotic agent status: Secondary | ICD-10-CM | POA: Insufficient documentation

## 2017-03-12 LAB — GLUCOSE, CAPILLARY
Glucose-Capillary: 54 mg/dL — ABNORMAL LOW (ref 65–99)
Glucose-Capillary: 124 mg/dL — ABNORMAL HIGH (ref 65–99)

## 2017-03-12 NOTE — MAU Note (Signed)
Pt sent from MFM for extended monitoring. 6/8 BPP. Pt c/o chronic back pain.

## 2017-03-12 NOTE — Discharge Instructions (Signed)
Fetal Movement Counts  Patient Name: ________________________________________________ Patient Due Date: ____________________  What is a fetal movement count?  A fetal movement count is the number of times that you feel your baby move during a certain amount of time. This may also be called a fetal kick count. A fetal movement count is recommended for every pregnant woman. You may be asked to start counting fetal movements as early as week 28 of your pregnancy.  Pay attention to when your baby is most active. You may notice your baby's sleep and wake cycles. You may also notice things that make your baby move more. You should do a fetal movement count:  · When your baby is normally most active.  · At the same time each day.    A good time to count movements is while you are resting, after having something to eat and drink.  How do I count fetal movements?  1. Find a quiet, comfortable area. Sit, or lie down on your side.  2. Write down the date, the start time and stop time, and the number of movements that you felt between those two times. Take this information with you to your health care visits.  3. For 2 hours, count kicks, flutters, swishes, rolls, and jabs. You should feel at least 10 movements during 2 hours.  4. You may stop counting after you have felt 10 movements.  5. If you do not feel 10 movements in 2 hours, have something to eat and drink. Then, keep resting and counting for 1 hour. If you feel at least 4 movements during that hour, you may stop counting.  Contact a health care provider if:  · You feel fewer than 4 movements in 2 hours.  · Your baby is not moving like he or she usually does.  Date: ____________ Start time: ____________ Stop time: ____________ Movements: ____________  Date: ____________ Start time: ____________ Stop time: ____________ Movements: ____________  Date: ____________ Start time: ____________ Stop time: ____________ Movements: ____________  Date: ____________ Start time:  ____________ Stop time: ____________ Movements: ____________  Date: ____________ Start time: ____________ Stop time: ____________ Movements: ____________  Date: ____________ Start time: ____________ Stop time: ____________ Movements: ____________  Date: ____________ Start time: ____________ Stop time: ____________ Movements: ____________  Date: ____________ Start time: ____________ Stop time: ____________ Movements: ____________  Date: ____________ Start time: ____________ Stop time: ____________ Movements: ____________  This information is not intended to replace advice given to you by your health care provider. Make sure you discuss any questions you have with your health care provider.  Document Released: 12/23/2006 Document Revised: 07/22/2016 Document Reviewed: 01/02/2016  Elsevier Interactive Patient Education © 2017 [employer]

## 2017-03-12 NOTE — MAU Provider Note (Signed)
History     CSN: [id number]  Arrival date and time: 03/12/17 1453      No chief complaint on file.  Shelly Lane is a 41 y.o. [id number] at 35w6dsent from MFM for prolonged fetal monitoring due to nonreactive NST with BPP 6/8 (-2FMB, AFI 12.9). She takes Neurontin, Flexeril  and Percocet for chronic back pain with right sciatica. Took Percocet and Flexeril today. She reports fetal activity less than usual for past five days. No vaginal bleeding or leakage of fluid. She has had Braxton-Hicks contractions intermittently, but denies feeling contractions today. States FBS 72 today and last ate at breakfast.  EFW at 34 weeks 81st percentile, symmetric.   Pregnancy significant for grandmultiparity, AMA, A1/2 GDM (pt d/c'd Glyburide), hx PTB at 36 wks x2.    OB History  Gravida Para Term Preterm AB Living  '12 8 6 2 3 8  '$ SAB TAB Ectopic Multiple Live Births  3     0 8    # Outcome Date GA Lbr Len/2nd Weight Sex Delivery Anes PTL Lv  12 Current           11 Term 11/15/14 372w1d3:17 / 00:07 3.805 kg (8 lb 6.2 oz) F Vag-Spont Local  LIV  10 Term 10/05/13 3858w5d:10 / 00:02 2.934 kg (6 lb 7.5 oz) F Vag-Spont None  LIV  9 SAB 07/09/12 11w43w0d     8 SAB 02/07/12 158w0d7158w0d       Birth Comments: System Generated. Please review and update pregnancy details.  7 Term 01/23/10 109w6d53w6d 3.11 kg (6 lb 13.7 oz) F Vag-Spont EPI  LIV     Birth Comments: System Generated. Please review and update pregnancy details.  6 Preterm 02/08/09 [redacted]w[redacted]d 52w2d2.99 kg (6 lb 9.5 oz) F Vag-Spont   LIV  5 SAB 02/2007 158w0d   56w0dDEC  4 Preterm 02/21/04 [redacted]w[redacted]d 0534w2d863 kg (6 lb 5 oz) F Vag-Spont EPI  LIV  3 Term 08/2002 [redacted]w[redacted]d 21:66w0d54 kg (8 lb 15 oz) M Vag-Spont EPI  LIV  2 Term 04/1999 3158w0d 05:046w0d kg (7 lb 9 oz) M Vag-Spont EPI  LIV  1 Term 11/1997 3158w0d 12:3033w0d kg (7 lb 6 oz) F Vag-Spont EPI  LIV        Past Medical History:  Diagnosis Date  . Abnormal Pap smear    Colpo;Last pap 2012,  ASCUS w/ negative HPV  . Anemia    during preg  . Anxiety   . ASCUS with positive high risk HPV   . Depression    ok now  . Eczema   . GERD (gastroesophageal reflux disease)   . Gestational diabetes   . H/O candidiasis   . H/O migraine   . H/O rubella   . H/O varicella   . H/O: depression   . H/O: obesity   . Headache(784.0)   . Hx: UTI (urinary tract infection)   . Miscarriage    3/13 and 7/13   . Preterm labor   . Vaginal Pap smear, abnormal     Past Surgical History:  Procedure Laterality Date  . CHOLECYSTECTOMY  2008  . HERNIA REPAIR  2012  . HIATAL HERNIA REPAIR  2012  . WISDOM TOOTH EXTRACTION      Family History  Problem Relation Age of Onset  . Diabetes Maternal Grandmother   . Hypertension Maternal Grandmother   . Anemia Mother  Social History  Substance Use Topics  . Smoking status: Never Smoker  . Smokeless tobacco: Never Used  . Alcohol use No    Allergies:  Allergies  Allergen Reactions  . Vicodin [Hydrocodone-Acetaminophen] Itching    Prescriptions Prior to Admission  Medication Sig Dispense Refill Last Dose  . ACCU-CHEK FASTCLIX LANCETS MISC 1 Units by Percutaneous route 4 (four) times daily. 100 each 12 Taking  . ALFALFA PO Take 1 capsule by mouth 3 (three) times daily.    Taking  . Blood Glucose Monitoring Suppl (ACCU-CHEK NANO SMARTVIEW) w/Device KIT 1 kit by Subdermal route as directed. Check blood sugars for fasting, and two hours after breakfast, lunch and dinner (4 checks daily) 1 kit 0 Taking  . clobetasol ointment (TEMOVATE) 0.05 % APPLY EXTERNALLY TO THE AFFECTED AREA TWICE DAILY 30 g 5 Taking  . cyclobenzaprine (FLEXERIL) 10 MG tablet Take 1 tablet by mouth three times daily as needed for muscle spasms  2 Taking  . fluticasone (FLONASE) 50 MCG/ACT nasal spray Place 2 sprays into both nostrils daily.   Taking  . gabapentin (NEURONTIN) 300 MG capsule Take 1 capsule (300 mg total) by mouth 3 (three) times daily. 90 capsule 5 Taking   . glucose blood (ACCU-CHEK SMARTVIEW) test strip Use as instructed to check blood sugars 100 each 12 Taking  . hydrOXYzine (VISTARIL) 50 MG capsule Take 1 capsule (50 mg total) by mouth every 6 (six) hours as needed. 45 capsule 2 Taking  . loratadine (CLARITIN) 10 MG tablet Take 10 mg by mouth daily.   Taking  . oxyCODONE-acetaminophen (PERCOCET) 7.5-325 MG tablet Take 1 tablet by mouth every 8 (eight) hours as needed for severe pain.   Taking  . Prenatal-Fe Fum-Methf-FA w/o A (VITAFOL-NANO) 18-0.6-0.4 MG TABS Take 1 tablet by mouth daily. 30 tablet 12 Taking  . terconazole (TERAZOL 7) 0.4 % vaginal cream PLACE 1 APPLICATORFUL VAGINALLY AT BEDTIME FOR 7 DAYS 45 g 0 Taking  . zolpidem (AMBIEN) 10 MG tablet Take 1 tablet (10 mg total) by mouth at bedtime as needed for sleep. 30 tablet 3 Taking    Review of Systems  Constitutional: Negative for chills, diaphoresis and fever.  Gastrointestinal: Negative for abdominal pain, diarrhea, nausea and vomiting.  Genitourinary: Negative for dysuria, frequency, vaginal bleeding and vaginal discharge.  Musculoskeletal: Positive for back pain. Negative for arthralgias.  Neurological: Negative for dizziness.  Psychiatric/Behavioral: Negative for confusion. The patient is not nervous/anxious.    Physical Exam   currently breastfeeding.  Physical Exam  Nursing note and vitals reviewed. Constitutional: She is oriented to person, place, and time. She appears well-developed and well-nourished. No distress.  HENT:  Head: Normocephalic.  Eyes: Pupils are equal, round, and reactive to light.  Neck: Normal range of motion.  Cardiovascular: Normal rate.   Respiratory: Effort normal.  GI: Soft. There is no tenderness. There is no guarding.  Musculoskeletal: Normal range of motion.  Neurological: She is alert and oriented to person, place, and time.  Skin: Skin is warm and dry.  Psychiatric: She has a normal mood and affect. Her behavior is normal. Thought  content normal.    MAU Course  Procedures   CBG (last 3)   Recent Labs  03/12/17 1519 03/12/17 1710  GLUCAP 54* 124*  Feels shakey> juice and food ordered at 1520   Fetal monitoring FHR baseline 130-135, moderate variability, accelerations present, no decelerations Toco irregular mild UC's   Reviewed with Dr. Harolyn Rutherford   Assessment and Plan  40yo [id number] at 11w6dFetal well-being by BPP 8/10 now with reactivity  Allergies as of 03/12/2017      Reactions   Vicodin [hydrocodone-acetaminophen] Itching      Medication List    STOP taking these medications   terconazole 0.4 % vaginal cream Commonly known as:  TERAZOL 7   zolpidem 10 MG tablet Commonly known as:  AMBIEN     TAKE these medications   ACCU-CHEK FASTCLIX LANCETS Misc 1 Units by Percutaneous route 4 (four) times daily.   ACCU-CHEK NANO SMARTVIEW w/Device Kit 1 kit by Subdermal route as directed. Check blood sugars for fasting, and two hours after breakfast, lunch and dinner (4 checks daily)   ALFALFA PO Take 1 capsule by mouth 3 (three) times daily.   clobetasol ointment 0.05 % Commonly known as:  TEMOVATE APPLY EXTERNALLY TO THE AFFECTED AREA TWICE DAILY   cyclobenzaprine 10 MG tablet Commonly known as:  FLEXERIL Take 1 tablet by mouth three times daily as needed for muscle spasms   fluticasone 50 MCG/ACT nasal spray Commonly known as:  FLONASE Place 2 sprays into both nostrils daily.   gabapentin 300 MG capsule Commonly known as:  NEURONTIN Take 1 capsule (300 mg total) by mouth 3 (three) times daily.   glucose blood test strip Commonly known as:  ACCU-CHEK SMARTVIEW Use as instructed to check blood sugars   hydrOXYzine 50 MG capsule Commonly known as:  VISTARIL Take 1 capsule (50 mg total) by mouth every 6 (six) hours as needed.   loratadine 10 MG tablet Commonly known as:  CLARITIN Take 10 mg by mouth daily.   oxyCODONE-acetaminophen 7.5-325 MG tablet Commonly known as:   PERCOCET Take 1 tablet by mouth every 8 (eight) hours as needed for severe pain.   VITAFOL-NANO 18-0.6-0.4 MG Tabs Take 1 tablet by mouth daily.      [place].   Specialty:  Obstetrics and Gynecology Why:  Keep your scheduled appointment for fetal testing Contact information: [phone]       DLorene Dy [place]4/05/2017 5:19 PM

## 2017-03-12 NOTE — Procedures (Signed)
Shelly Lane [birth year] [redacted]w[redacted]d  Fetus A Non-Stress Test Interpretation for 03/12/17  Indication: Unsatisfactory BPP  Fetal Heart Rate A Mode: External Baseline Rate (A): 130 bpm Variability: Minimal, Moderate Accelerations: 10 x 10 Decelerations: None Multiple birth?: No  Uterine Activity Mode: Toco, Palpation Contraction Frequency (min): irregular Contraction Duration (sec): 50-60 Contraction Quality: Mild Resting Tone Palpated: Relaxed Resting Time: Adequate  Interpretation (Fetal Testing) Nonstress Test Interpretation: Non-reactive Comments: Reviewed tracing with Dr. Sherrie George

## 2017-03-12 NOTE — ED Notes (Signed)
Report called to MAU.  Pt to MAU per Dr. Sherrie George for further monitoring.  Pt/family walked and signed in to MAU.

## 2017-03-15 ENCOUNTER — Ambulatory Visit (INDEPENDENT_AMBULATORY_CARE_PROVIDER_SITE_OTHER): Payer: Medicaid Other | Admitting: Obstetrics and Gynecology

## 2017-03-15 ENCOUNTER — Ambulatory Visit (HOSPITAL_COMMUNITY)
Admission: RE | Admit: 2017-03-15 | Discharge: 2017-03-15 | Disposition: A | Discharge status: Home / Self Care | Payer: Medicaid Other | Source: Ambulatory Visit | Attending: Obstetrics and Gynecology | Admitting: Obstetrics and Gynecology

## 2017-03-15 VITALS — BP 112/78 | HR 112 | Wt 276.0 lb

## 2017-03-15 DIAGNOSIS — O24913 Unspecified diabetes mellitus in pregnancy, third trimester: Secondary | ICD-10-CM | POA: Insufficient documentation

## 2017-03-15 DIAGNOSIS — O0993 Supervision of high risk pregnancy, unspecified, third trimester: Secondary | ICD-10-CM

## 2017-03-15 DIAGNOSIS — O09523 Supervision of elderly multigravida, third trimester: Secondary | ICD-10-CM | POA: Diagnosis not present

## 2017-03-15 DIAGNOSIS — O289 Unspecified abnormal findings on antenatal screening of mother: Secondary | ICD-10-CM | POA: Insufficient documentation

## 2017-03-15 DIAGNOSIS — Z3A37 37 weeks gestation of pregnancy: Secondary | ICD-10-CM | POA: Diagnosis not present

## 2017-03-15 DIAGNOSIS — O24419 Gestational diabetes mellitus in pregnancy, unspecified control: Secondary | ICD-10-CM

## 2017-03-15 DIAGNOSIS — O09529 Supervision of elderly multigravida, unspecified trimester: Secondary | ICD-10-CM

## 2017-03-15 DIAGNOSIS — Z8632 Personal history of gestational diabetes: Secondary | ICD-10-CM

## 2017-03-15 MED ORDER — ZOLPIDEM TARTRATE 10 MG PO TABS: 10.0000 mg | ORAL_TABLET | Freq: Every evening | ORAL | 3 refills | Status: DC | PRN

## 2017-03-15 NOTE — Progress Notes (Signed)
   PRENATAL VISIT NOTE  Subjective:  Shelly Lane is a 41 y.o. [id number] at [redacted]w[redacted]d being seen today for ongoing prenatal care.  She is currently monitored for the following issues for this high-risk pregnancy and has OBESITY; ANXIETY; BULIMIA; DEPRESSION; SYMPTOM, PAIN, ABDOMINAL, EPIGASTRIC; Elderly multigravida with antepartum condition or complication; SAB (spontaneous abortion) x 4; Grand multipara; Hx of preterm delivery x 2, currently pregnant; Hx gestational diabetes x 2; Multiple drug allergies; History of abnormal cervical Pap smear; Supervision of high-risk pregnancy; Chronic pain disorder; Drug abuse; and Gestational diabetes mellitus (GDM) affecting pregnancy, antepartum on her problem list.  Patient reports pelvic pressure.  Contractions: Irregular. Vag. Bleeding: None.  Movement: Present. Denies leaking of fluid.   The following portions of the patient's history were reviewed and updated as appropriate: allergies, current medications, past family history, past medical history, past social history, past surgical history and problem list. Problem list updated.  Objective:   Vitals:   03/15/17 1502  BP: 112/78  Pulse: (!) 112  Weight: 276 lb (125.2 kg)    Fetal Status: Fetal Heart Rate (bpm): NST   Movement: Present  Presentation: Vertex  General:  Alert, oriented and cooperative. Patient is in no acute distress.  Skin: Skin is warm and dry. No rash noted.   Cardiovascular: Normal heart rate noted  Respiratory: Normal respiratory effort, no problems with respiration noted  Abdomen: Soft, gravid, appropriate for gestational age. Pain/Pressure: Present     Pelvic:  Cervical exam performed Dilation: 3 Effacement (%): 40 Station: Ballotable  Extremities: Normal range of motion.  Edema: None  Mental Status: Normal mood and affect. Normal behavior. Normal judgment and thought content.   Assessment and Plan:  Pregnancy: [id number] at [redacted]w[redacted]d  1. Gestational diabetes mellitus  (GDM) affecting pregnancy, antepartum Patient did not bring log book. She reports fasting values less than 90 and pp as high as 124 She is not currently taking glyburide Patient with history of non reactive NST weekly. Today's NST reviewed and nonreactive. Baseline 140, mod variability, 10x 10 accels, no decels. Patient sent for BPP STAT Given persistent non reactive NST, will plan for IOL at 39 weeks if no spontaneous onset of labor  3. Supervision of high risk pregnancy in third trimester Patient is doing well without complaints  4. Elderly multigravida with antepartum condition or complication Normal NIPS  Term labor symptoms and general obstetric precautions including but not limited to vaginal bleeding, contractions, leaking of fluid and fetal movement were reviewed in detail with the patient. Please refer to After Visit Summary for other counseling recommendations.  No Follow-up on file.   Catalina Antigua, MD

## 2017-03-15 NOTE — Progress Notes (Signed)
Patient is in the office reports fetal movement, with pressure and irregular contractions. Pt states that she lost her mucous plug on Friday.

## 2017-03-17 ENCOUNTER — Other Ambulatory Visit: Payer: Self-pay | Admitting: Certified Nurse Midwife

## 2017-03-17 ENCOUNTER — Other Ambulatory Visit: Payer: Self-pay | Admitting: Obstetrics

## 2017-03-17 DIAGNOSIS — M546 Pain in thoracic spine: Principal | ICD-10-CM

## 2017-03-17 DIAGNOSIS — G8929 Other chronic pain: Secondary | ICD-10-CM

## 2017-03-22 ENCOUNTER — Ambulatory Visit (HOSPITAL_COMMUNITY)
Admission: RE | Admit: 2017-03-22 | Discharge: 2017-03-22 | Disposition: A | Discharge status: Home / Self Care | Payer: Medicaid Other | Source: Ambulatory Visit | Attending: Obstetrics and Gynecology | Admitting: Obstetrics and Gynecology

## 2017-03-22 ENCOUNTER — Ambulatory Visit (INDEPENDENT_AMBULATORY_CARE_PROVIDER_SITE_OTHER): Payer: Medicaid Other | Admitting: Obstetrics and Gynecology

## 2017-03-22 ENCOUNTER — Other Ambulatory Visit: Payer: Self-pay | Admitting: Obstetrics and Gynecology

## 2017-03-22 VITALS — BP 114/76 | HR 116 | Wt 274.2 lb

## 2017-03-22 DIAGNOSIS — O24419 Gestational diabetes mellitus in pregnancy, unspecified control: Secondary | ICD-10-CM

## 2017-03-22 DIAGNOSIS — O0993 Supervision of high risk pregnancy, unspecified, third trimester: Secondary | ICD-10-CM

## 2017-03-22 DIAGNOSIS — O09529 Supervision of elderly multigravida, unspecified trimester: Secondary | ICD-10-CM

## 2017-03-22 DIAGNOSIS — Z3A38 38 weeks gestation of pregnancy: Secondary | ICD-10-CM | POA: Insufficient documentation

## 2017-03-22 DIAGNOSIS — M546 Pain in thoracic spine: Secondary | ICD-10-CM

## 2017-03-22 DIAGNOSIS — O09523 Supervision of elderly multigravida, third trimester: Secondary | ICD-10-CM | POA: Diagnosis not present

## 2017-03-22 DIAGNOSIS — O24913 Unspecified diabetes mellitus in pregnancy, third trimester: Secondary | ICD-10-CM | POA: Diagnosis not present

## 2017-03-22 DIAGNOSIS — Z641 Problems related to multiparity: Secondary | ICD-10-CM

## 2017-03-22 DIAGNOSIS — G8929 Other chronic pain: Secondary | ICD-10-CM

## 2017-03-22 MED ORDER — GABAPENTIN 300 MG PO CAPS: 300.0000 mg | ORAL_CAPSULE | Freq: Three times a day (TID) | ORAL | 5 refills | Status: DC

## 2017-03-22 NOTE — Progress Notes (Signed)
Patient is in the office, reports fetal movement and irregular contractions.

## 2017-03-22 NOTE — Progress Notes (Signed)
   PRENATAL VISIT NOTE  Subjective:  Shelly Lane is a 41 y.o. [id number] at [redacted]w[redacted]d being seen today for ongoing prenatal care.  She is currently monitored for the following issues for this high-risk pregnancy and has OBESITY; ANXIETY; BULIMIA; DEPRESSION; SYMPTOM, PAIN, ABDOMINAL, EPIGASTRIC; Elderly multigravida with antepartum condition or complication; SAB (spontaneous abortion) x 4; Grand multipara; Hx of preterm delivery x 2, currently pregnant; Hx gestational diabetes x 2; Multiple drug allergies; History of abnormal cervical Pap smear; Supervision of high-risk pregnancy; Chronic pain disorder; Drug abuse; and Gestational diabetes mellitus (GDM) affecting pregnancy, antepartum on her problem list.  Patient reports pelvic pressure.  Contractions: Irregular. Vag. Bleeding: None.  Movement: Present. Denies leaking of fluid.   The following portions of the patient's history were reviewed and updated as appropriate: allergies, current medications, past family history, past medical history, past social history, past surgical history and problem list. Problem list updated.  Objective:   Vitals:   03/22/17 1442  BP: 114/76  Pulse: (!) 116  Weight: 274 lb 3.2 oz (124.4 kg)    Fetal Status: Fetal Heart Rate (bpm): NST   Movement: Present  Presentation: Vertex  General:  Alert, oriented and cooperative. Patient is in no acute distress.  Skin: Skin is warm and dry. No rash noted.   Cardiovascular: Normal heart rate noted  Respiratory: Normal respiratory effort, no problems with respiration noted  Abdomen: Soft, gravid, appropriate for gestational age. Pain/Pressure: Present     Pelvic:  Cervical exam performed Dilation: 4.5 Effacement (%): 50 Station: Ballotable  Extremities: Normal range of motion.  Edema: None  Mental Status: Normal mood and affect. Normal behavior. Normal judgment and thought content.   Assessment and Plan:  Pregnancy: [id number] at [redacted]w[redacted]d  1. Gestational diabetes  mellitus (GDM) affecting pregnancy, antepartum Patient admits to not checking CBG this past week NST non reactive today with baseline 140, mod variability, no accels, no decels Will send for a BPP today Given lack of compliance with CBG monitoring and persistent non reactive NST over the past few weeks- will plan for IOL at 39 weeks Patient with a favorable cervix - [place] MFM FETAL BPP WO NON STRESS; Future  2. Supervision of high risk pregnancy in third trimester Patient is doing well without complaints - [place] MFM FETAL BPP WO NON STRESS; Future  3. Elderly multigravida with antepartum condition or complication Low risk NIPS  4. Grand multipara  5. Chronic bilateral thoracic back pain Refill on gabapentin provided - gabapentin (NEURONTIN) 300 MG capsule; Take 1 capsule (300 mg total) by mouth 3 (three) times daily.  Dispense: 90 capsule; Refill: 5  Term labor symptoms and general obstetric precautions including but not limited to vaginal bleeding, contractions, leaking of fluid and fetal movement were reviewed in detail with the patient. Please refer to After Visit Summary for other counseling recommendations.  No Follow-up on file.   Catalina Antigua, MD

## 2017-03-23 ENCOUNTER — Encounter (HOSPITAL_COMMUNITY): Payer: Self-pay | Admitting: *Deleted

## 2017-03-23 ENCOUNTER — Telehealth (HOSPITAL_COMMUNITY): Payer: Self-pay | Admitting: *Deleted

## 2017-03-23 ENCOUNTER — Telehealth: Payer: Self-pay

## 2017-03-23 NOTE — Telephone Encounter (Signed)
Preadmission screen  

## 2017-03-23 NOTE — Telephone Encounter (Signed)
TC from pt states she was told by the sonographer last night after BPP to contact the office because baby's EFW was almost 4200g and she needed IOL STAT. Reviewed u/s. MFM recommends IOL 03/26/17. Consulted with provider in office Dr. Alysia Penna and on call provider Dr. Mickeal Skinner. Pt is to keep scheduled IOL. Pt aware of the providers' decision. Pt voiced words for being upset and voiced threats of suing. She is afraid she will need a c/s and/or baby will need shoulder dystocia. Informed pt I understand she is frustrated and she should try not to worry. EFW is just an estimated. Sympathy given.

## 2017-03-23 NOTE — Telephone Encounter (Deleted)
ERROR

## 2017-03-24 ENCOUNTER — Other Ambulatory Visit: Payer: Self-pay | Admitting: Advanced Practice Midwife

## 2017-03-25 ENCOUNTER — Encounter (HOSPITAL_COMMUNITY): Payer: Self-pay

## 2017-03-25 ENCOUNTER — Ambulatory Visit (HOSPITAL_COMMUNITY)
Admission: RE | Admit: 2017-03-25 | Discharge: 2017-03-25 | Disposition: A | Discharge status: Home / Self Care | Payer: Medicaid Other | Source: Ambulatory Visit | Attending: Obstetrics and Gynecology | Admitting: Obstetrics and Gynecology

## 2017-03-25 DIAGNOSIS — O24419 Gestational diabetes mellitus in pregnancy, unspecified control: Secondary | ICD-10-CM

## 2017-03-25 DIAGNOSIS — O0993 Supervision of high risk pregnancy, unspecified, third trimester: Secondary | ICD-10-CM

## 2017-03-27 ENCOUNTER — Inpatient Hospital Stay (HOSPITAL_COMMUNITY)
Admission: RE | Admit: 2017-03-27 | Discharge: 2017-03-29 | DRG: 775 | Disposition: A | Discharge status: Home / Self Care | Payer: Medicaid Other | Source: Ambulatory Visit | Attending: Family Medicine | Admitting: Family Medicine

## 2017-03-27 ENCOUNTER — Encounter (HOSPITAL_COMMUNITY): Payer: Self-pay

## 2017-03-27 DIAGNOSIS — O3663X0 Maternal care for excessive fetal growth, third trimester, not applicable or unspecified: Secondary | ICD-10-CM | POA: Diagnosis present

## 2017-03-27 DIAGNOSIS — G8929 Other chronic pain: Secondary | ICD-10-CM | POA: Diagnosis present

## 2017-03-27 DIAGNOSIS — O24420 Gestational diabetes mellitus in childbirth, diet controlled: Principal | ICD-10-CM | POA: Diagnosis present

## 2017-03-27 DIAGNOSIS — O99354 Diseases of the nervous system complicating childbirth: Secondary | ICD-10-CM | POA: Diagnosis present

## 2017-03-27 DIAGNOSIS — Z3A39 39 weeks gestation of pregnancy: Secondary | ICD-10-CM

## 2017-03-27 DIAGNOSIS — O24429 Gestational diabetes mellitus in childbirth, unspecified control: Secondary | ICD-10-CM

## 2017-03-27 DIAGNOSIS — Z79891 Long term (current) use of opiate analgesic: Secondary | ICD-10-CM | POA: Diagnosis not present

## 2017-03-27 DIAGNOSIS — O24419 Gestational diabetes mellitus in pregnancy, unspecified control: Secondary | ICD-10-CM

## 2017-03-27 LAB — TYPE AND SCREEN
ABO/RH(D): O POS
Antibody Screen: NEGATIVE

## 2017-03-27 LAB — RPR: RPR Ser Ql: NONREACTIVE

## 2017-03-27 LAB — CBC
HCT: 32.8 % — ABNORMAL LOW (ref 36.0–46.0)
Hemoglobin: 11.1 g/dL — ABNORMAL LOW (ref 12.0–15.0)
MCH: 29.8 pg (ref 26.0–34.0)
MCHC: 33.8 g/dL (ref 30.0–36.0)
MCV: 87.9 fL (ref 78.0–100.0)
Platelets: 195 10*3/uL (ref 150–400)
RBC: 3.73 MIL/uL — ABNORMAL LOW (ref 3.87–5.11)
RDW: 15.4 % (ref 11.5–15.5)
WBC: 9.2 10*3/uL (ref 4.0–10.5)

## 2017-03-27 LAB — GLUCOSE, CAPILLARY
Glucose-Capillary: 94 mg/dL (ref 65–99)
Glucose-Capillary: 124 mg/dL — ABNORMAL HIGH (ref 65–99)

## 2017-03-27 MED ORDER — SODIUM CHLORIDE 0.9 % IV SOLN: 250.0000 mL | INTRAVENOUS | Status: DC | PRN

## 2017-03-27 MED ORDER — PHENYLEPHRINE 40 MCG/ML (10ML) SYRINGE FOR IV PUSH (FOR BLOOD PRESSURE SUPPORT): 80.0000 ug | PREFILLED_SYRINGE | INTRAVENOUS | Status: DC | PRN

## 2017-03-27 MED ORDER — EPHEDRINE 5 MG/ML INJ
10.0000 mg | INTRAVENOUS | Status: DC | PRN
  Filled 2017-03-27: qty 2

## 2017-03-27 MED ORDER — FENTANYL 2.5 MCG/ML BUPIVACAINE 1/10 % EPIDURAL INFUSION (WH - ANES): 14.0000 mL/h | INTRAMUSCULAR | Status: DC | PRN

## 2017-03-27 MED ORDER — ZOLPIDEM TARTRATE 5 MG PO TABS: 5.0000 mg | ORAL_TABLET | Freq: Every evening | ORAL | Status: DC | PRN

## 2017-03-27 MED ORDER — SIMETHICONE 80 MG PO CHEW: 80.0000 mg | CHEWABLE_TABLET | ORAL | Status: DC | PRN

## 2017-03-27 MED ORDER — TETANUS-DIPHTH-ACELL PERTUSSIS 5-2.5-18.5 LF-MCG/0.5 IM SUSP: 0.5000 mL | Freq: Once | INTRAMUSCULAR | Status: DC

## 2017-03-27 MED ORDER — LACTATED RINGERS IV SOLN: 500.0000 mL | INTRAVENOUS | Status: DC | PRN

## 2017-03-27 MED ORDER — TERBUTALINE SULFATE 1 MG/ML IJ SOLN
0.2500 mg | Freq: Once | INTRAMUSCULAR | Status: DC | PRN
  Filled 2017-03-27: qty 1

## 2017-03-27 MED ORDER — GABAPENTIN 300 MG PO CAPS
300.0000 mg | ORAL_CAPSULE | Freq: Three times a day (TID) | ORAL | Status: DC
  Filled 2017-03-27: qty 1

## 2017-03-27 MED ORDER — FENTANYL 2.5 MCG/ML BUPIVACAINE 1/10 % EPIDURAL INFUSION (WH - ANES)
INTRAMUSCULAR | Status: AC
  Filled 2017-03-27: qty 100

## 2017-03-27 MED ORDER — LACTATED RINGERS IV SOLN: 500.0000 mL | Freq: Once | INTRAVENOUS | Status: DC

## 2017-03-27 MED ORDER — PENICILLIN G POTASSIUM 5000000 UNITS IJ SOLR
5.0000 10*6.[IU] | Freq: Once | INTRAVENOUS | Status: DC
  Filled 2017-03-27: qty 5

## 2017-03-27 MED ORDER — ONDANSETRON HCL 4 MG/2ML IJ SOLN: 4.0000 mg | Freq: Four times a day (QID) | INTRAMUSCULAR | Status: DC | PRN

## 2017-03-27 MED ORDER — OXYTOCIN BOLUS FROM INFUSION
500.0000 mL | Freq: Once | INTRAVENOUS | Status: AC
  Administered 2017-03-27: 500 mL via INTRAVENOUS

## 2017-03-27 MED ORDER — SODIUM CHLORIDE 0.9% FLUSH: 3.0000 mL | Freq: Two times a day (BID) | INTRAVENOUS | Status: DC

## 2017-03-27 MED ORDER — PRENATAL MULTIVITAMIN CH
1.0000 | ORAL_TABLET | Freq: Every day | ORAL | Status: DC
  Administered 2017-03-28 – 2017-03-29 (×2): 1 via ORAL
  Filled 2017-03-27 (×2): qty 1

## 2017-03-27 MED ORDER — DIPHENHYDRAMINE HCL 50 MG/ML IJ SOLN: 12.5000 mg | INTRAMUSCULAR | Status: DC | PRN

## 2017-03-27 MED ORDER — DIBUCAINE 1 % RE OINT: 1.0000 "application " | TOPICAL_OINTMENT | RECTAL | Status: DC | PRN

## 2017-03-27 MED ORDER — ACETAMINOPHEN 325 MG PO TABS: 650.0000 mg | ORAL_TABLET | ORAL | Status: DC | PRN

## 2017-03-27 MED ORDER — SENNOSIDES-DOCUSATE SODIUM 8.6-50 MG PO TABS
2.0000 | ORAL_TABLET | ORAL | Status: DC
  Administered 2017-03-27 – 2017-03-29 (×2): 2 via ORAL
  Filled 2017-03-27 (×2): qty 2

## 2017-03-27 MED ORDER — SODIUM CHLORIDE 0.9% FLUSH: 3.0000 mL | INTRAVENOUS | Status: DC | PRN

## 2017-03-27 MED ORDER — IBUPROFEN 600 MG PO TABS
600.0000 mg | ORAL_TABLET | Freq: Four times a day (QID) | ORAL | Status: DC
  Administered 2017-03-27 – 2017-03-29 (×9): 600 mg via ORAL
  Filled 2017-03-27 (×8): qty 1

## 2017-03-27 MED ORDER — DIPHENHYDRAMINE HCL 25 MG PO CAPS: 25.0000 mg | ORAL_CAPSULE | Freq: Four times a day (QID) | ORAL | Status: DC | PRN

## 2017-03-27 MED ORDER — OXYTOCIN 40 UNITS IN LACTATED RINGERS INFUSION - SIMPLE MED: 2.5000 [IU]/h | INTRAVENOUS | Status: DC | PRN

## 2017-03-27 MED ORDER — ONDANSETRON HCL 4 MG/2ML IJ SOLN: 4.0000 mg | INTRAMUSCULAR | Status: DC | PRN

## 2017-03-27 MED ORDER — ONDANSETRON HCL 4 MG PO TABS: 4.0000 mg | ORAL_TABLET | ORAL | Status: DC | PRN

## 2017-03-27 MED ORDER — EPHEDRINE 5 MG/ML INJ: 10.0000 mg | INTRAVENOUS | Status: DC | PRN

## 2017-03-27 MED ORDER — BENZOCAINE-MENTHOL 20-0.5 % EX AERO: 1.0000 "application " | INHALATION_SPRAY | CUTANEOUS | Status: DC | PRN

## 2017-03-27 MED ORDER — LACTATED RINGERS IV SOLN
INTRAVENOUS | Status: DC
  Administered 2017-03-27: 01:00:00 via INTRAVENOUS

## 2017-03-27 MED ORDER — EPHEDRINE 5 MG/ML INJ
10.0000 mg | INTRAVENOUS | Status: DC | PRN
Start: 2017-03-27 — End: 2017-03-27

## 2017-03-27 MED ORDER — PENICILLIN G POT IN DEXTROSE 60000 UNIT/ML IV SOLN
3.0000 10*6.[IU] | INTRAVENOUS | Status: DC
  Filled 2017-03-27 (×3): qty 50

## 2017-03-27 MED ORDER — OXYTOCIN 40 UNITS IN LACTATED RINGERS INFUSION - SIMPLE MED: 2.5000 [IU]/h | INTRAVENOUS | Status: DC

## 2017-03-27 MED ORDER — PHENYLEPHRINE 40 MCG/ML (10ML) SYRINGE FOR IV PUSH (FOR BLOOD PRESSURE SUPPORT)
80.0000 ug | PREFILLED_SYRINGE | INTRAVENOUS | Status: DC | PRN
  Filled 2017-03-27: qty 5

## 2017-03-27 MED ORDER — OXYTOCIN 40 UNITS IN LACTATED RINGERS INFUSION - SIMPLE MED
1.0000 m[IU]/min | INTRAVENOUS | Status: DC
  Administered 2017-03-27: 12 m[IU]/min via INTRAVENOUS
  Administered 2017-03-27: 14 m[IU]/min via INTRAVENOUS
  Administered 2017-03-27: 2 m[IU]/min via INTRAVENOUS
  Filled 2017-03-27: qty 1000

## 2017-03-27 MED ORDER — PHENYLEPHRINE 40 MCG/ML (10ML) SYRINGE FOR IV PUSH (FOR BLOOD PRESSURE SUPPORT)
PREFILLED_SYRINGE | INTRAVENOUS | Status: AC
  Filled 2017-03-27: qty 20

## 2017-03-27 MED ORDER — WITCH HAZEL-GLYCERIN EX PADS: 1.0000 "application " | MEDICATED_PAD | CUTANEOUS | Status: DC | PRN

## 2017-03-27 MED ORDER — LIDOCAINE HCL (PF) 1 % IJ SOLN
30.0000 mL | INTRAMUSCULAR | Status: AC | PRN
  Administered 2017-03-27: 30 mL via SUBCUTANEOUS
  Filled 2017-03-27: qty 30

## 2017-03-27 MED ORDER — OXYCODONE HCL 5 MG PO TABS
5.0000 mg | ORAL_TABLET | Freq: Four times a day (QID) | ORAL | Status: DC | PRN
  Administered 2017-03-27 – 2017-03-29 (×5): 5 mg via ORAL
  Filled 2017-03-27 (×5): qty 1

## 2017-03-27 MED ORDER — COCONUT OIL OIL
1.0000 "application " | TOPICAL_OIL | Status: DC | PRN
  Administered 2017-03-28: 1 via TOPICAL
  Filled 2017-03-27: qty 120

## 2017-03-27 MED ORDER — SOD CITRATE-CITRIC ACID 500-334 MG/5ML PO SOLN: 30.0000 mL | ORAL | Status: DC | PRN

## 2017-03-27 MED ORDER — FENTANYL CITRATE (PF) 100 MCG/2ML IJ SOLN
100.0000 ug | INTRAMUSCULAR | Status: DC | PRN
  Administered 2017-03-27: 100 ug via INTRAVENOUS
  Filled 2017-03-27: qty 2

## 2017-03-27 NOTE — Lactation Note (Signed)
This note was copied from a baby's chart. Lactation Consultation Note  Patient Name: Shelly Lane [id number] Date: 03/27/2017 Reason for consult: Initial assessment Baby 11 hours old. Mom reports that baby sleep right now. Mom states that she did spoon-feed earlier. Mom reports that undressing the baby doesn't make her more awake since she was bathed. Enc mom to continue to offer lots of STS and latch with cues. Enc mom to keep hand expressing and spoon-feeding when baby is sleepy at the breast.   Mom given [facility] brochure, aware of OP/BFSG and LC phone line assistance after D/C.   Maternal Data Has patient been taught Hand Expression?: Yes Does the patient have breastfeeding experience prior to this delivery?: Yes  Feeding Feeding Type: Breast Milk Length of feed: 10 min  LATCH Score/Interventions Latch: Grasps breast easily, tongue down, lips flanged, rhythmical sucking.  Audible Swallowing: A few with stimulation  Type of Nipple: Everted at rest and after stimulation  Comfort (Breast/Nipple): Filling, red/small blisters or bruises, mild/mod discomfort  Problem noted: Mild/Moderate discomfort Interventions (Mild/moderate discomfort): Hand expression  Hold (Positioning): No assistance needed to correctly position infant at breast.  LATCH Score: 8  Lactation Tools Discussed/Used     Consult Status Consult Status: Follow-up Date: 03/28/17 Follow-up type: In-patient    Sherlyn Hay 03/27/2017, 7:57 PM

## 2017-03-27 NOTE — Progress Notes (Signed)
Patient comfortable. AROM at 0710 with clear fluid.   Objective: BP 120/63   Pulse (!) 104   Temp 98.4 F (36.9 C) (Oral)   Resp 18   Ht  (1.676 m)   Wt 275 lb (124.7 kg)   LMP  (LMP Unknown)   BMI 44.39 kg/m   FHT:  FHR: 130 bpm, variability: moderate,  accelerations:  Present,  decelerations:  Absent UC:  Regular every 5 minutes SVE:   Dilation: 6 Effacement (%): 50, 60 Station: -2 Exam by:: Cleone Slim SNM Pitocin at 76mu/min   Labs: Lab Results  Component Value Date   WBC 9.2 03/27/2017   HGB 11.1 (L) 03/27/2017   HCT 32.8 (L) 03/27/2017   MCV 87.9 03/27/2017   PLT 195 03/27/2017    Assessment / Plan: IUP at term. IOL for gestational diabetes.   Continue pitocin. Will recheck in 2-3 hours.   Cleone Slim 03/27/2017, 7:38 AM   I confirm that I have verified the information documented in the student nurse midwife's note and that I have also personally reperformed the physical exam and all medical decision making activities.  Cam Hai 03/27/2017 10:12 AM

## 2017-03-27 NOTE — H&P (Signed)
Shelly Lane is a 41 y.o. female [id number] @ 39.0wks by 6wk scan presenting for IOL due to A1GDM. She was prescribed glyburide during the 3rd trimester, but she never started it and claimed to eat better. Her log wasn't brought to multiple appts. EFW on 4/17: 9+2 (largest baby was 8+15). Denies leaking or bldg. No H/A or visual disturbances. Her preg has also been remarkable for 1) AMA 2) grandmultiparity 3) chronic pain for which she has been prescribed Gabapentin this preg; +UDS for oxycodone (takes 3 percocet/day, managed by pain management).  OB History    Gravida Para Term Preterm AB Living   SAB TAB Ectopic Multiple Live Births   3     0 8     Past Medical History:  Diagnosis Date  . Abnormal Pap smear    Colpo;Last pap 2012, ASCUS w/ negative HPV  . Anemia    during preg  . Anxiety   . ASCUS with positive high risk HPV   . Depression    ok now  . Eczema   . GERD (gastroesophageal reflux disease)   . Gestational diabetes    diet controlled  . H/O candidiasis   . H/O migraine   . H/O rubella   . H/O varicella   . H/O: depression   . H/O: obesity   . Headache(784.0)   . Hx: UTI (urinary tract infection)   . Miscarriage    3/13 and 7/13   . Preterm labor   . Vaginal Pap smear, abnormal    Past Surgical History:  Procedure Laterality Date  . CHOLECYSTECTOMY  2008  . HERNIA REPAIR  2012  . HIATAL HERNIA REPAIR  2012  . WISDOM TOOTH EXTRACTION     Family History: family history includes Anemia in her mother; Diabetes in her maternal grandmother; Hypertension in her maternal grandmother. Social History:  reports that she has never smoked. She has never used smokeless tobacco. She reports that she does not drink alcohol or use drugs.     Maternal Diabetes: Yes:  Diabetes Type:  Diet controlled- questionable control Genetic Screening: Normal Maternal Ultrasounds/Referrals: Normal Fetal Ultrasounds or other Referrals:  None Maternal Substance Abuse:   Yes:  Type: Other:  takes 3 percocet/day (managed by pain management for chronic pain) Significant Maternal Medications:  None Significant Maternal Lab Results:  Lab values include: Group B Strep negative Other Comments:  None  ROS History   Temperature 98.1 F (36.7 C), currently breastfeeding. Exam Physical Exam  Constitutional: She is oriented to person, place, and time. She appears well-developed.  HENT:  Head: Normocephalic.  Neck: Normal range of motion.  Cardiovascular: Normal rate.   Respiratory: Effort normal.  GI:  EFM 130s, +accels, no decels Irreg ctx q 4-7 mins  Musculoskeletal: Normal range of motion.  Neurological: She is alert and oriented to person, place, and time.  Skin: Skin is warm and dry.  Psychiatric: She has a normal mood and affect. Her behavior is normal. Thought content normal.    Prenatal labs: ABO, Rh: O/Positive/-- (10/03 1704) Antibody: Negative (10/03 1704) Rubella: 2.54 (10/03 1704) RPR: Non Reactive (01/29 1635)  HBsAg: Negative (10/03 1704)  HIV: Non Reactive (01/29 1635)  GBS: Negative (03/27 1640)   Assessment/Plan: IUP@39 .0wks A1GDM AMA Grandmultip Macrosomia Favorable cx  Admit to [employer] Plan Pitocin for IOL method Anticipate SVD   Sonna Lipsky CNM 03/27/2017, 1:12 AM

## 2017-03-28 MED ORDER — GABAPENTIN 300 MG PO CAPS
300.0000 mg | ORAL_CAPSULE | Freq: Three times a day (TID) | ORAL | Status: DC
  Administered 2017-03-28 – 2017-03-29 (×3): 300 mg via ORAL
  Filled 2017-03-28 (×7): qty 1

## 2017-03-28 NOTE — Progress Notes (Addendum)
Patient ID: Shelly Lane, female   DOB: 1976/02/13, 41 y.o.   MRN: [id number]  POSTPARTUM PROGRESS NOTE  Post Partum Day #1 Subjective:  Shelly Lane is a 41 y.o. [id number] [redacted]w[redacted]d s/p NSVD.  No acute events overnight.  Pt denies problems with ambulating, voiding or po intake.  She denies nausea or vomiting.  Pain is well controlled.  She has had flatus. She has not had bowel movement.  Lochia Small.   Objective: Blood pressure 104/61, pulse 87, temperature 97.9 F (36.6 C), resp. rate 18, height  (1.676 m), weight 275 lb (124.7 kg), SpO2 100 %, unknown if currently breastfeeding.  Physical Exam:  General: alert, cooperative and no distress Lochia:normal flow Chest: CTAB Heart: RRR no m/r/g Abdomen: +BS, soft, nontender,  Uterine Fundus: firm, below umbilicus DVT Evaluation: No calf swelling or tenderness Extremities: Trace edema   Recent Labs  03/27/17 0115  HGB 11.1*  HCT 32.8*    Assessment/Plan:  ASSESSMENT: Shelly Lane is a 41 y.o. [id number] [redacted]w[redacted]d s/p NSVD  Plan for discharge tomorrow, Social Work consult and Contraception IUD- Paraguard  Restarting gabapentin   LOS: 1 day   Jen Mow, DO OB Fellow [facility], [facility]  03/28/2017, 9:33 AM

## 2017-03-28 NOTE — Lactation Note (Signed)
This note was copied from a baby's chart. Lactation Consultation Note  Patient Name: Shelly Lane [id number] Date: 03/28/2017 Reason for consult: Follow-up assessment  Baby 36 hours. Mom reports that baby has started opening her mouth wider and achieving a deeper latch. Mom reports that her milk is starting to come to volume.   Maternal Data    Feeding Feeding Type: Breast Fed Length of feed: 10 min  LATCH Score/Interventions Latch: Grasps breast easily, tongue down, lips flanged, rhythmical sucking.  Audible Swallowing: Spontaneous and intermittent Intervention(s): Skin to skin  Type of Nipple: Everted at rest and after stimulation  Comfort (Breast/Nipple): Soft / non-tender     Hold (Positioning): No assistance needed to correctly position infant at breast.  LATCH Score: 10  Lactation Tools Discussed/Used     Consult Status Consult Status: PRN    Sherlyn Hay 03/28/2017, 9:13 PM

## 2017-03-28 NOTE — Progress Notes (Signed)
CSW is attempted to meet with MOB at bedside to complete assessment for consult regarding hx of opiate use, anxiety, depression and bulimia. Upon this writers arrival, MOB requested she be seen tomorrow as she has not slept and is very tired. CSW will follow-up at a later time.  Tameya Kuznia, MSW, LCSW-A Clinical Social Worker  [facility]  Office: [phone]   

## 2017-03-28 NOTE — Anesthesia Preprocedure Evaluation (Addendum)
Anesthesia Evaluation  Patient identified by MRN, date of birth, ID band Patient awake    Reviewed: Allergy & Precautions, H&P , NPO status , Patient's Chart, lab work & pertinent test results, reviewed documented beta blocker date and time   Airway Mallampati: II  TM Distance: >3 FB Neck ROM: Full    Dental no notable dental hx.    Pulmonary neg pulmonary ROS,    Pulmonary exam normal breath sounds clear to auscultation       Cardiovascular negative cardio ROS Normal cardiovascular exam Rhythm:Regular Rate:Normal     Neuro/Psych negative neurological ROS  negative psych ROS   GI/Hepatic negative GI ROS, Neg liver ROS,   Endo/Other  negative endocrine ROSdiabetes  Renal/GU negative Renal ROS  negative genitourinary   Musculoskeletal negative musculoskeletal ROS (+)   Abdominal   Peds negative pediatric ROS (+)  Hematology negative hematology ROS (+)   Anesthesia Other Findings   Reproductive/Obstetrics negative OB ROS (+) Pregnancy                            Anesthesia Physical Anesthesia Plan  ASA: II  Anesthesia Plan: Epidural   Post-op Pain Management:    Induction:   PONV Risk Score and Plan:   Airway Management Planned:   Additional Equipment:   Intra-op Plan:   Post-operative Plan:   Informed Consent: I have reviewed the patients History and Physical, chart, labs and discussed the procedure including the risks, benefits and alternatives for the proposed anesthesia with the patient or authorized representative who has indicated his/her understanding and acceptance.     Plan Discussed with:   Anesthesia Plan Comments: (Patient delivered prior to procedure being performed.)        Anesthesia Quick Evaluation

## 2017-03-29 MED ORDER — IBUPROFEN 600 MG PO TABS: 600.0000 mg | ORAL_TABLET | Freq: Four times a day (QID) | ORAL | 2 refills | Status: DC

## 2017-03-29 MED ORDER — OXYCODONE HCL 5 MG PO TABS: 5.0000 mg | ORAL_TABLET | Freq: Four times a day (QID) | ORAL | 0 refills | Status: DC | PRN

## 2017-03-29 NOTE — Lactation Note (Signed)
This note was copied from a baby's chart. Lactation Consultation Note  Patient Name: Shelly Lane [id number] Date: 03/29/2017 Reason for consult: Follow-up assessment Mom latching baby independently. This is Mom's 9th baby. Some good suckling bursts observed, few swallows noted. Lots of colostrum present with hand expression. Mom feels her milk is starting to come in. Mom reports she is taking Goat's rue, alfalfa, fennel, fenugreek supplements to bring milk in well. She has had good milk supply with some babies and LMS with others so she wanted to be pro-active about maximizing milk volume. Baby is cluster feeding this am. Advised to continue to BF with feeding ques, 8-12 times or more in 24 hours. Try to BF both breasts most feedings 15-30 minutes. Baby having lots of output and Mom reports stools are becoming transitional. Some nipple soreness reported, Mom using EBM/coconut oil prn. Reviewed good positioning for more depth with latch. Encouraged Mom to call for questions/concerns or assist as desired.   Maternal Data    Feeding Feeding Type: Breast Fed Length of feed: 55 min  LATCH Score/Interventions Latch: Grasps breast easily, tongue down, lips flanged, rhythmical sucking.  Audible Swallowing: A few with stimulation  Type of Nipple: Everted at rest and after stimulation  Comfort (Breast/Nipple): Soft / non-tender     Hold (Positioning): Assistance needed to correctly position infant at breast and maintain latch.  LATCH Score: 8  Lactation Tools Discussed/Used     Consult Status Consult Status: Follow-up Date: 03/30/17 Follow-up type: In-patient    Alfred Levins 03/29/2017, 11:24 AM

## 2017-03-29 NOTE — Progress Notes (Signed)
Post Partum Day #2 Subjective: no complaints, up ad lib, voiding and tolerating PO  Objective: Blood pressure (!) 110/58, pulse 87, temperature 98.9 F (37.2 C), resp. rate 20, height  (1.676 m), weight 275 lb (124.7 kg), SpO2 100 %, unknown if currently breastfeeding.  Physical Exam:  General: alert, cooperative and no distress Lochia: appropriate Uterine Fundus: firm Incision: 1st deg perineal; healing DVT Evaluation: No evidence of DVT seen on physical exam.   Recent Labs  03/27/17 0115  HGB 11.1*  HCT 32.8*    Assessment/Plan: Discharge home, Breastfeeding and Contraception Paragard planned. SW consult in process for opiate use during pregnancy.  Infant on NAS protocol.    LOS: 2 days   Roe Coombs, CNM 03/29/2017, 8:13 AM

## 2017-03-29 NOTE — Progress Notes (Signed)
Post discharge chart review completed.  

## 2017-03-29 NOTE — Clinical Social Work Maternal (Signed)
CLINICAL SOCIAL WORK MATERNAL/CHILD NOTE  Patient Details  Name: Shelly Lane MRN: [id number] Date of Birth: 05/09/1976  Date:  11-11-17  Clinical Social Worker Initiating Note:  Laurey Arrow Date/ Time Initiated:  03/29/17/1113     Child's Name:      Legal Guardian:  Mother (FOB is Aliou Shackett 05/14/1967)   Need for Interpreter:  None   Date of Referral:  March 26, 2017     Reason for Referral:  Behavioral Health Issues, including SI , Current Substance Use/Substance Use During Pregnancy  (hx of anxiety/depression)   Referral Source:  [employer]   Address:  [place]. 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   Education:  Database administrator Resources:  Medicaid   Other Resources:      Cultural/Religious Considerations Which May Impact Care:  MOB is Muslim  Strengths:  Engineer, materials , Ability to meet basic needs , Understanding of illness, Home prepared for child    Risk Factors/Current Problems:  Substance Use , Mental Health Concerns    Cognitive State:  Alert , Able to Concentrate , Linear Thinking , Insightful , Goal Oriented    Mood/Affect:  Bright , Happy , Comfortable , Interested    CSW Assessment: CSW met with MOB to complete an assessment for hx of substance use and hx of anxiety/depression.  When CSW arrived, MOB was bonding with infant as evidence by engaging in breastfeeding.  MOB's 39  year old son  Nikoletta Varma 08/10/02) was on the couch watching TV.   MOB was adamant about MOB's son remaining in the room while CSW completed the assessment. MOB was polite, easy to engage, and was receptive to meeting with CSW.  CSW inquired about MOB's MH hx and MOB acknowledged a  hx of anxiety/depression over 5 years ago.  MOB reported that MOB was not on any medications and MOB's symptoms subsided. CSW educated MOB about PPD. CSW informed MOB of possible supports and interventions to decrease PPD.  CSW also encouraged MOB to seek medical attention if needed for increased signs and symptoms for PPD. MOB confirmed PPD signs and symptoms with MOB's 8th child.  MOB reported daily uncontrollable crying and a lack of interest in infant.  MOB stated that MOB immediately contacted MOB's OB and was prescribed Wellbutrin; MOB discontinued medication after 4 months and symptoms subsided. MOB agreed to contact OB office if PPD signs or symptoms present with infant.  CSW also provided MOB with PPD checklist in effort to assist MOB with identifying PPD signs and symptoms.   CSW inquired about MOB's Rx for opioids and MOB reported intensive back pain and reports being an establish patient with "Pain Management".  CSW  reviewed the hospital's drug screen policy and MOB was not concerned.  CSW informed MOB that infant had a negative UDS, and CSW will continue to follow the infant's CDS.  CSW made MOB aware that due to MOB's substance use hx in pregnancy, CSW will make a referral to [place] for a substance exposed infant.  CSW explained a difference between a CPS referral and a CPS report. CSW thanked MOB for meeting with CSW. CSW encouraged MOB to contact CSW if MOB had any additional questions or concerns.  CSW Plan/Description:  Information/Referral to [employer] , [employer] , No Further Intervention Required/No Barriers to Discharge (CSW will continue to monitor infant's CDS and will make a CPS report if warranted. )   Laurey Arrow, MSW, LCSW Clinical Social Work [phone]   Dimple Nanas, LCSW 03/29/2017, 11:26 AM

## 2017-03-29 NOTE — Discharge Summary (Signed)
OB Discharge Summary     Patient Name: Shelly Lane DOB: 01/10/1976 MRN: [id number]  Date of admission: 03/27/2017 Delivering MD: Jen Mow [facility]   Date of discharge: 03/29/2017  Admitting diagnosis: induction 39wks  Intrauterine pregnancy: [redacted]w[redacted]d     Secondary diagnosis:  Active Problems:   Gestational diabetes  Additional problems: Opiate use during pregnancy     Discharge diagnosis: Term Pregnancy Delivered and GDM A1                                                                                                Post partum procedures:none  Augmentation: AROM and Pitocin  Complications: None  Hospital course:  Induction of Labor With Vaginal Delivery   41 y.o. yo [id number] at [redacted]w[redacted]d was admitted to the hospital 03/27/2017 for induction of labor.  Indication for induction: A1 DM.  Patient had an uncomplicated labor course as follows: Membrane Rupture Time/Date: 7:09 AM ,03/27/2017   Intrapartum Procedures: Episiotomy: None [1]                                         Lacerations:  1st degree [2];Perineal [11]  Patient had delivery of a Viable infant.  Information for the patient's newborn:  Ruqaya, Strauss [[id number]]  Delivery Method: Vaginal, Spontaneous Delivery (Filed from Delivery Summary)   03/27/2017  Details of delivery can be found in separate delivery note.  Patient had a routine postpartum course. Patient is discharged home 03/29/17.  Physical exam  Vitals:   03/27/17 2328 03/28/17 0635 03/28/17 1809 03/29/17 0505  BP: (!) 142/86 104/61 109/69 (!) 110/58  Pulse: 97 87 89 87  Resp:  Temp:  97.9 F (36.6 C) 97.9 F (36.6 C) 98.9 F (37.2 C)  TempSrc:   Oral   SpO2:   100%   Weight:      Height:       General: alert, cooperative and no distress Lochia: appropriate Uterine Fundus: firm Incision: 1st degree lac, healing DVT Evaluation: No evidence of DVT seen on physical exam. No cords or calf tenderness. No significant  calf/ankle edema. Labs: Lab Results  Component Value Date   WBC 9.2 03/27/2017   HGB 11.1 (L) 03/27/2017   HCT 32.8 (L) 03/27/2017   MCV 87.9 03/27/2017   PLT 195 03/27/2017   CMP Latest Ref Rng & Units 03/02/2017  Glucose 65 - 99 mg/dL 81  BUN 6 - 20 mg/dL 5(L)  Creatinine 3.08 - 1.00 mg/dL 6.57(Q)  Sodium 469 - 629 mmol/L 138  Potassium 3.5 - 5.2 mmol/L 4.5  Chloride 96 - 106 mmol/L 101  CO2 18 - 29 mmol/L 22  Calcium 8.7 - 10.2 mg/dL 9.5  Total Protein 6.0 - 8.5 g/dL 6.2  Total Bilirubin 0.0 - 1.2 mg/dL 0.4  Alkaline Phos 39 - 117 IU/L 72  AST 0 - 40 IU/L 13  ALT 0 - 32 IU/L 7    Discharge instruction: per After Visit Summary and "Baby and Me  Booklet".  After visit meds:  Allergies as of 03/29/2017      Reactions   Vicodin [hydrocodone-acetaminophen] Itching      Medication List    STOP taking these medications   clobetasol ointment 0.05 % Commonly known as:  TEMOVATE   oxyCODONE-acetaminophen 7.5-325 MG tablet Commonly known as:  PERCOCET     TAKE these medications   ALFALFA PO Take 1 capsule by mouth 3 (three) times daily.   cyclobenzaprine 10 MG tablet Commonly known as:  FLEXERIL Take 10 mg by mouth 3 (three) times daily as needed for muscle spasms.   fluticasone 50 MCG/ACT nasal spray Commonly known as:  FLONASE Place 2 sprays into both nostrils daily as needed for rhinitis.   gabapentin 300 MG capsule Commonly known as:  NEURONTIN Take 1 capsule (300 mg total) by mouth 3 (three) times daily.   hydrOXYzine 50 MG capsule Commonly known as:  VISTARIL Take 1 capsule (50 mg total) by mouth every 6 (six) hours as needed. What changed:  reasons to take this   ibuprofen 600 MG tablet Commonly known as:  ADVIL,MOTRIN Take 1 tablet (600 mg total) by mouth every 6 (six) hours.   loratadine 10 MG tablet Commonly known as:  CLARITIN Take 10 mg by mouth daily as needed for allergies.   oxyCODONE 5 MG immediate release tablet Commonly known as:  Oxy  IR/ROXICODONE Take 1 tablet (5 mg total) by mouth every 6 (six) hours as needed for severe pain or breakthrough pain.   VITAFOL-NANO 18-0.6-0.4 MG Tabs Take 1 tablet by mouth daily.   zolpidem 10 MG tablet Commonly known as:  AMBIEN Take 1 tablet (10 mg total) by mouth at bedtime as needed for sleep.       Diet: routine diet  Activity: Advance as tolerated. Pelvic rest for 6 weeks.   Outpatient follow up:4 weeks Follow up Appt:No future appointments. Follow up Visit:No Follow-up on file.  Postpartum contraception: IUD Paragard  Newborn Data: Live born female  Birth Weight: 8 lb 9.2 oz (3890 g) APGAR: 8, 9  Baby Feeding: Bottle and Breast Disposition:rooming in, NAS protocol   03/29/2017 Roe Coombs, CNM

## 2017-03-29 NOTE — Addendum Note (Signed)
Encounter addended by: Vanetta Shawl, RT, RVT, RDMS on: 03/29/2017 12:19 PM<BR>    Actions taken: Imaging Exam ended

## 2017-03-29 NOTE — Lactation Note (Signed)
This note was copied from a baby's chart. Lactation Consultation Note Baby had 7% weight loss in 39 hrs of age. BF well, cluster feeding at times. Good out put. Had 11 voids and 6 stools which can cause 7 % weight loss.  Patient Name: Shelly Lane [id number] Date: 03/29/2017 Reason for consult: Follow-up assessment;Infant weight loss   Maternal Data    Feeding Feeding Type: Breast Fed Length of feed: 13 min  LATCH Score/Interventions                      Lactation Tools Discussed/Used     Consult Status Consult Status: PRN Date: 03/29/17 Follow-up type: In-patient    Charyl Dancer 03/29/2017, 4:19 AM

## 2017-03-30 ENCOUNTER — Other Ambulatory Visit: Payer: Self-pay | Admitting: Certified Nurse Midwife

## 2017-04-01 NOTE — Anesthesia Postprocedure Evaluation (Signed)
Anesthesia Post Note  Patient: Shelly Lane  Procedure(s) Performed: * No procedures listed *  Comments: No Procedures performed on this patient       Last Vitals: There were no vitals filed for this visit.  Last Pain: There were no vitals filed for this visit.               Caly Pellum A.

## 2017-05-27 ENCOUNTER — Ambulatory Visit: Payer: Self-pay

## 2017-05-27 NOTE — Lactation Note (Signed)
This note was copied from a baby's chart. Lactation Consult  Mother's reason for visit: Mother is concerned that infant doesn't have a strong enough latch to get milk out. Mother reports that she can get 4 ounces when she pumps. Mother reports that she doesn't even suck hard on the bottle.  Mothers goal is to see what infant is getting from the breast. Mother is doing breast compression and she feels that this has made her lazy.  Visit Type: feeding assessment   Appointment Notes: infant was admitted for Failure to Thrive at 17% weight loss on June 13. Infant was seen by Dr Veryl SpeakEtta today and weight today was 8-7. Consult:  Initial Lactation Consultant:  Michel BickersKendrick, Shalamar Crays McCoy  ________________________________________________________________________    ________________________________________________________________________  Mother's Name: Shelly Lane Dia Type of delivery:  Vaginal, Spontaneous Delivery Breastfeeding Experience: 1 yr the first 3 children, preterm was the fourth and breastfed for 5 months.  Maternal Medical Conditions:  Gest Diabetes, PP Depression with last child, mother denies depression at this time. She reports that she does have anxiety due to infants hospitalization.  Maternal Medications:  Black seed oil to increase milk supply, Prenatal vits, Milk flow.  ________________________________________________________________________  Breastfeeding History (Post Discharge)  Frequency of breastfeeding:  Every 2-3 hours infant is being fed 24 cal formula , she take 2-2.5 ounces.  Duration of feeding:15 mins  Infant is given 2-2.5 ounces every 2-3 hours she is giving Similac Advance 24 calories. Mother is using the Dr Manson PasseyBrown nipple  Pumping  Type of pump:  Medela pump in style Frequency:  Every 2-3 hours Volume:  4 ounces  Infant Intake and Output Assessment  Voids:  8-10 in 24 hrs.  Color:  Clear yellow Stools:  2 in 24 hrs.  Color:  Green and  Yellow  ________________________________________________________________________  Maternal Breast Assessment  Breast:  Full Nipple:  Erect Pain level:  2 Pain interventions:  Bra, Cream/oil and Breast pump  _______________________________________________________________________ Feeding Assessment/Evaluation: Infant alert , making eye contact and smiling.   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 She humps the mid tongue when sucking and chews at the breast.  Positioning:  Cross cradle Left breast  LATCH documentation:  Latch:  1 = Repeated attempts needed to sustain latch, nipple held in mouth throughout feeding, stimulation needed to elicit sucking reflex.  Audible swallowing:  1 = A few with stimulation  Type of nipple:  2 = Everted at rest and after stimulation  Comfort (Breast/Nipple):  1 = Filling, red/small blisters or bruises, mild/mod discomfort  Hold (Positioning):  1 = Assistance needed to correctly position infant at breast and maintain latch  LATCH score:  6  Attached assessment:  Shallow  Lips flanged:  Yes.    Lips untucked:  Yes.    Suck assessment:  Displays both  Tools:  Nipple shield 24 mm Instructed on use and cleaning of tool:  Yes.    Pre-feed weight: 8-7, 3826 Post-feed weight: 8-7.2, 3834  Amount transferred:  8 ml  Changed large stool and infant was weighed again  Pre-feed weight: [phone],3814         Mother was fit with #24 nipple shield. Infant suckled with more rhythm for several mins.  Post-weight: 8-6.8, 3820 Amount transferred: 6 ml  Total amount transferred:  14 ml Total supplement given: 80 ml of Similac Advance 24  calorie, LC gave bottle and suggested to pull bottle nipple in and out slightly and put pressure on back of tongue as pulling nipple out. Infant tolerated well and took within 20 mins.    Advised mother to continue to supplement infant with 2.5-3 ounces of formula 24 calories Feed infant with feeding cues and at least every 2-3 hours, 8-12 times in 24 hours Mother to continue to offer breast for 15 mins after bottle feeding.  Mother to continue to post pump after feeding for 15-20 mins.  [facility] to call today to schedule a weight check tomorrow.  Mother reports that she will come twice a week to weigh infant Mother has an appt with Peds in one month. Advised mother to talk with Peds about tongue evaluation Mother to follow up with [facility] office for another pre and post weight test in 2 weeks

## 2017-06-20 ENCOUNTER — Other Ambulatory Visit: Payer: Self-pay | Admitting: Obstetrics & Gynecology

## 2017-06-20 DIAGNOSIS — L2084 Intrinsic (allergic) eczema: Secondary | ICD-10-CM

## 2017-09-19 ENCOUNTER — Other Ambulatory Visit: Payer: Self-pay | Admitting: Obstetrics and Gynecology

## 2017-10-01 ENCOUNTER — Other Ambulatory Visit: Payer: Self-pay | Admitting: Obstetrics and Gynecology

## 2017-10-01 ENCOUNTER — Other Ambulatory Visit: Payer: Self-pay | Admitting: Certified Nurse Midwife

## 2017-10-01 DIAGNOSIS — R7989 Other specified abnormal findings of blood chemistry: Secondary | ICD-10-CM

## 2017-11-05 ENCOUNTER — Inpatient Hospital Stay (HOSPITAL_COMMUNITY): Payer: Medicaid Other | Admitting: Anesthesiology

## 2017-11-10 NOTE — Addendum Note (Signed)
Addendum  created 11/10/17 0020 by Bethena Midgetddono, Barret Esquivel, MD   Intraprocedure Staff edited

## 2017-12-06 ENCOUNTER — Encounter (HOSPITAL_COMMUNITY): Payer: Self-pay

## 2017-12-06 ENCOUNTER — Inpatient Hospital Stay (HOSPITAL_COMMUNITY)
Admission: AD | Admit: 2017-12-06 | Discharge: 2017-12-07 | Disposition: A | Discharge status: Home / Self Care | Payer: Medicaid Other | Source: Ambulatory Visit | Attending: Obstetrics and Gynecology | Admitting: Obstetrics and Gynecology

## 2017-12-06 DIAGNOSIS — Z7951 Long term (current) use of inhaled steroids: Secondary | ICD-10-CM | POA: Diagnosis not present

## 2017-12-06 DIAGNOSIS — O26891 Other specified pregnancy related conditions, first trimester: Secondary | ICD-10-CM | POA: Diagnosis not present

## 2017-12-06 DIAGNOSIS — Z3A01 Less than 8 weeks gestation of pregnancy: Secondary | ICD-10-CM | POA: Diagnosis not present

## 2017-12-06 DIAGNOSIS — O468X1 Other antepartum hemorrhage, first trimester: Secondary | ICD-10-CM

## 2017-12-06 DIAGNOSIS — Z87440 Personal history of urinary (tract) infections: Secondary | ICD-10-CM | POA: Insufficient documentation

## 2017-12-06 DIAGNOSIS — O09521 Supervision of elderly multigravida, first trimester: Secondary | ICD-10-CM | POA: Insufficient documentation

## 2017-12-06 DIAGNOSIS — Z79899 Other long term (current) drug therapy: Secondary | ICD-10-CM | POA: Insufficient documentation

## 2017-12-06 DIAGNOSIS — O09211 Supervision of pregnancy with history of pre-term labor, first trimester: Secondary | ICD-10-CM | POA: Insufficient documentation

## 2017-12-06 DIAGNOSIS — O208 Other hemorrhage in early pregnancy: Secondary | ICD-10-CM | POA: Diagnosis not present

## 2017-12-06 DIAGNOSIS — E669 Obesity, unspecified: Secondary | ICD-10-CM | POA: Diagnosis not present

## 2017-12-06 DIAGNOSIS — O418X10 Other specified disorders of amniotic fluid and membranes, first trimester, not applicable or unspecified: Secondary | ICD-10-CM | POA: Diagnosis not present

## 2017-12-06 DIAGNOSIS — R109 Unspecified abdominal pain: Secondary | ICD-10-CM | POA: Insufficient documentation

## 2017-12-06 DIAGNOSIS — N939 Abnormal uterine and vaginal bleeding, unspecified: Secondary | ICD-10-CM

## 2017-12-06 DIAGNOSIS — O99211 Obesity complicating pregnancy, first trimester: Secondary | ICD-10-CM | POA: Insufficient documentation

## 2017-12-06 DIAGNOSIS — Z885 Allergy status to narcotic agent status: Secondary | ICD-10-CM | POA: Insufficient documentation

## 2017-12-06 DIAGNOSIS — Z832 Family history of diseases of the blood and blood-forming organs and certain disorders involving the immune mechanism: Secondary | ICD-10-CM | POA: Insufficient documentation

## 2017-12-06 DIAGNOSIS — Z9049 Acquired absence of other specified parts of digestive tract: Secondary | ICD-10-CM | POA: Diagnosis not present

## 2017-12-06 LAB — CBC
HCT: 37.1 % (ref 36.0–46.0)
Hemoglobin: 12.4 g/dL (ref 12.0–15.0)
MCH: 28.6 pg (ref 26.0–34.0)
MCHC: 33.4 g/dL (ref 30.0–36.0)
MCV: 85.7 fL (ref 78.0–100.0)
Platelets: 259 10*3/uL (ref 150–400)
RBC: 4.33 MIL/uL (ref 3.87–5.11)
RDW: 14.5 % (ref 11.5–15.5)
WBC: 8 10*3/uL (ref 4.0–10.5)

## 2017-12-06 NOTE — MAU Note (Signed)
Pt reports cramping and spotting that started a few hours ago.  Took 4 home pregnancy tests which were positive.

## 2017-12-07 ENCOUNTER — Inpatient Hospital Stay (HOSPITAL_COMMUNITY): Payer: Medicaid Other

## 2017-12-07 ENCOUNTER — Encounter (HOSPITAL_COMMUNITY): Payer: Self-pay

## 2017-12-07 DIAGNOSIS — O418X10 Other specified disorders of amniotic fluid and membranes, first trimester, not applicable or unspecified: Secondary | ICD-10-CM

## 2017-12-07 DIAGNOSIS — O468X1 Other antepartum hemorrhage, first trimester: Secondary | ICD-10-CM

## 2017-12-07 LAB — POCT PREGNANCY, URINE: Preg Test, Ur: POSITIVE — AB

## 2017-12-07 LAB — WET PREP, GENITAL
Clue Cells Wet Prep HPF POC: NONE SEEN
Sperm: NONE SEEN
Trich, Wet Prep: NONE SEEN
Yeast Wet Prep HPF POC: NONE SEEN

## 2017-12-07 LAB — COMPREHENSIVE METABOLIC PANEL
ALT: 29 U/L (ref 14–54)
AST: 22 U/L (ref 15–41)
Albumin: 3.5 g/dL (ref 3.5–5.0)
Alkaline Phosphatase: 72 U/L (ref 38–126)
Anion gap: 10 (ref 5–15)
BUN: 13 mg/dL (ref 6–20)
CO2: 22 mmol/L (ref 22–32)
Calcium: 8.9 mg/dL (ref 8.9–10.3)
Chloride: 103 mmol/L (ref 101–111)
Creatinine, Ser: 0.69 mg/dL (ref 0.44–1.00)
GFR calc Af Amer: >60 mL/min (ref >60)
GFR calc non Af Amer: >60 mL/min (ref >60)
Glucose, Bld: 97 mg/dL (ref 65–99)
Potassium: 3.8 mmol/L (ref 3.5–5.1)
Sodium: 135 mmol/L (ref 135–145)
Total Bilirubin: 1.1 mg/dL (ref 0.3–1.2)
Total Protein: 6.4 g/dL — ABNORMAL LOW (ref 6.5–8.1)

## 2017-12-07 LAB — HCG, QUANTITATIVE, PREGNANCY: hCG, Beta Chain, Quant, S: 22465 m[IU]/mL — ABNORMAL HIGH (ref <5)

## 2017-12-07 NOTE — Discharge Instructions (Signed)
Subchorionic Hematoma °A subchorionic hematoma is a gathering of blood between the outer wall of the placenta and the inner wall of the womb (uterus). The placenta is the organ that connects the fetus to the wall of the uterus. The placenta performs the feeding, breathing (oxygen to the fetus), and waste removal (excretory work) of the fetus. °Subchorionic hematoma is the most common abnormality found on a result from ultrasonography done during the first trimester or early second trimester of pregnancy. If there has been little or no vaginal bleeding, early small hematomas usually shrink on their own and do not affect your baby or pregnancy. The blood is gradually absorbed over 1-2 weeks. When bleeding starts later in pregnancy or the hematoma is larger or occurs in an older pregnant woman, the outcome may not be as good. Larger hematomas may get bigger, which increases the chances for miscarriage. Subchorionic hematoma also increases the risk of premature detachment of the placenta from the uterus, preterm (premature) labor, and stillbirth. °Follow these instructions at home: °· Stay on bed rest if your health care provider recommends this. Although bed rest will not prevent more bleeding or prevent a miscarriage, your health care provider may recommend bed rest until you are advised otherwise. °· Avoid heavy lifting (more than 10 lb [4.5 kg]), exercise, sexual intercourse, or douching as directed by your health care provider. °· Keep track of the number of pads you use each day and how soaked (saturated) they are. Write down this information. °· Do not use tampons. °· Keep all follow-up appointments as directed by your health care provider. Your health care provider may ask you to have follow-up blood tests or ultrasound tests or both. °Get help right away if: °· You have severe cramps in your stomach, back, abdomen, or pelvis. °· You have a fever. °· You pass large clots or tissue. Save any tissue for your  health care provider to look at. °· Your bleeding increases or you become lightheaded, feel weak, or have fainting episodes. °This information is not intended to replace advice given to you by your health care provider. Make sure you discuss any questions you have with your health care provider. °Document Released: 03/10/2007 Document Revised: 04/30/2016 Document Reviewed: 06/22/2013 °Elsevier Interactive Patient Education © 2017 [employer] Inc. ° °

## 2017-12-07 NOTE — MAU Provider Note (Signed)
Patient Shelly Lane is a 42 y.o. [id number] At Unknown here with complaints of abdominal pain and bleeding. Patient took 4 pregnancy tests at home and they were all positive. Patient thinks that she is [redacted] weeks pregnant.  History     CSN: [id number]  Arrival date and time: 12/06/17 2302   First Provider Initiated Contact with Patient 12/06/17 2347      Chief Complaint  Patient presents with  . Abdominal Pain  . Vaginal Bleeding   Abdominal Pain  This is a new problem. The current episode started today. The onset quality is sudden. The problem has been unchanged. The pain is located in the suprapubic region. The pain is at a severity of 5/10. The abdominal pain does not radiate. Pertinent negatives include no diarrhea or nausea. Nothing aggravates the pain. The pain is relieved by nothing.  Vaginal Bleeding  The patient's primary symptoms include vaginal discharge. The patient's pertinent negatives include no genital itching, genital lesions or genital odor. This is a new problem. The current episode started today. The problem occurs intermittently. The problem has been unchanged. Associated symptoms include abdominal pain. Pertinent negatives include no diarrhea or nausea.   Patient states that she had some pink spotting when she wiped about two hours ago (around 10 pm). She has not had to change a pad or wear a pantyliner. She also has some cramping like she's going to have diarrhea. The pain is not localized to one side or the other, but instead is all across her suprapubic region.   OB History    Gravida Para Term Preterm AB Living   13 9 7 2 3 9    SAB TAB Ectopic Multiple Live Births   3     0 9      Past Medical History:  Diagnosis Date  . Abnormal Pap smear    Colpo;Last pap 2012, ASCUS w/ negative HPV  . Anemia    during preg  . Anxiety   . ASCUS with positive high risk HPV   . Depression    ok now  . Eczema   . GERD (gastroesophageal reflux disease)   .  Gestational diabetes    diet controlled  . H/O candidiasis   . H/O migraine   . H/O rubella   . H/O varicella   . H/O: depression   . H/O: obesity   . Headache(784.0)   . Hx: UTI (urinary tract infection)   . Miscarriage    3/13 and 7/13   . Preterm labor   . Vaginal Pap smear, abnormal     Past Surgical History:  Procedure Laterality Date  . CHOLECYSTECTOMY  2008  . HERNIA REPAIR  2012  . HIATAL HERNIA REPAIR  2012  . WISDOM TOOTH EXTRACTION      Family History  Problem Relation Age of Onset  . Diabetes Maternal Grandmother   . Hypertension Maternal Grandmother   . Anemia Mother     Social History   Tobacco Use  . Smoking status: Never Smoker  . Smokeless tobacco: Never Used  Substance Use Topics  . Alcohol use: No  . Drug use: No    Allergies:  Allergies  Allergen Reactions  . Vicodin [Hydrocodone-Acetaminophen] Itching    Medications Prior to Admission  Medication Sig Dispense Refill Last Dose  . cyclobenzaprine (FLEXERIL) 10 MG tablet Take 10 mg by mouth 3 (three) times daily as needed for muscle spasms.   12/06/2017 at Unknown time  . oxyCODONE (OXY IR/ROXICODONE)  5 MG immediate release tablet Take 1 tablet (5 mg total) by mouth every 6 (six) hours as needed for severe pain or breakthrough pain. 30 tablet 0 12/06/2017 at Unknown time  . Prenatal-Fe Fum-Methf-FA w/o A (VITAFOL-NANO) 18-0.6-0.4 MG TABS Take 1 tablet by mouth daily. 30 tablet 12 12/06/2017 at Unknown time  . ALFALFA PO Take 1 capsule by mouth 3 (three) times daily.    03/26/2017 at Unknown time  . clobetasol ointment (TEMOVATE) 0.05 % APPLY TOPICALLY TO THE AFFECTED AREA TWICE DAILY 30 g 0   . fluticasone (FLONASE) 50 MCG/ACT nasal spray Place 2 sprays into both nostrils daily as needed for rhinitis.    Past Week at Unknown time  . gabapentin (NEURONTIN) 300 MG capsule Take 1 capsule (300 mg total) by mouth 3 (three) times daily. 90 capsule 5 03/26/2017 at Unknown time  . hydrOXYzine (VISTARIL)  50 MG capsule Take 1 capsule (50 mg total) by mouth every 6 (six) hours as needed. (Patient taking differently: Take 50 mg by mouth every 6 (six) hours as needed for itching. ) 45 capsule 2 Past Month at Unknown time  . ibuprofen (ADVIL,MOTRIN) 600 MG tablet Take 1 tablet (600 mg total) by mouth every 6 (six) hours. 120 tablet 2   . labetalol (NORMODYNE) 200 MG tablet TAKE 1 TABLET(200 MG) BY MOUTH TWICE DAILY 60 tablet 0   . loratadine (CLARITIN) 10 MG tablet Take 10 mg by mouth daily as needed for allergies.    Past Week at Unknown time  . zolpidem (AMBIEN) 10 MG tablet Take 1 tablet (10 mg total) by mouth at bedtime as needed for sleep. 30 tablet 3 Past Week at Unknown time    Review of Systems  HENT: Negative.   Respiratory: Negative.   Cardiovascular: Negative.   Gastrointestinal: Positive for abdominal pain. Negative for diarrhea, nausea and rectal pain.  Genitourinary: Positive for vaginal bleeding and vaginal discharge.  Musculoskeletal: Negative.   Skin: Negative.   Neurological: Negative.   Psychiatric/Behavioral: Negative.    Physical Exam   Blood pressure 123/74, pulse 98, temperature 98.5 F (36.9 C), temperature source Oral, resp. rate 16, height 5\' 6"  (1.676 m), weight 257 lb 12.8 oz (116.9 kg), last menstrual period 10/22/2017, unknown if currently breastfeeding.  Physical Exam  Constitutional: She is oriented to person, place, and time. She appears well-developed and well-nourished.  HENT:  Head: Normocephalic.  Neck: Normal range of motion.  Respiratory: Effort normal.  GI: Soft.  Genitourinary:  Genitourinary Comments: NEFG; no lesions on vaginal walls. Cervix is pink with no lesions, no blood extruding from the os or prsent in the vagina. Cervix is 1 cm, long and anterior. No CMT, suprapubic or adnexal tenderness.   Musculoskeletal: Normal range of motion.  Neurological: She is alert and oriented to person, place, and time.  Skin: Skin is warm and dry.   Psychiatric: She has a normal mood and affect.    MAU Course  Procedures  MDM -[place] -Beta is 22,000 -US shows SIUP at 5 weeks and 5 days with moderate subchorionic hemorrhage.   Assessment and Plan   1. Subchorionic hemorrhage of placenta in first trimester, single or unspecified fetus   2. Vaginal bleeding    2. Patient stable for discharge; reviewed findings from US. Reviewed when to return to MAU and normalcy of spotting [facility].  3. [facility]  4. All questions answered.   Charlesetta GaribaldiKathryn Lorraine Kooistra 12/07/2017, 12:34 AM

## 2017-12-07 NOTE — L&D Delivery Note (Signed)
Delivery Note At 4:16 AM a viable female was delivered via Vaginal, Spontaneous (Presentation:OA;ROA ).  APGAR:8, 9; weight pending .   Placenta status:intact ,spontaneous delivery .  Cord: loose nuchal reduced with somersault maneuver  with the following complications:none .  Cord pH: N/A  Anesthesia:  epidural Episiotomy: None Lacerations: 1st degree;Perineal, hemostatic Suture Repair: N/A Est. Blood Loss (mL):  100  Mom to postpartum.  Baby to Couplet care / Skin to Skin.  Calvert CantorSamantha C Azlin Zilberman, CNM 07/18/2018, 4:31 AM

## 2017-12-08 LAB — GC/CHLAMYDIA PROBE AMP (~~LOC~~) NOT AT ARMC
Chlamydia: NEGATIVE
Neisseria Gonorrhea: NEGATIVE

## 2017-12-13 ENCOUNTER — Encounter: Payer: Self-pay | Admitting: *Deleted

## 2018-01-13 ENCOUNTER — Ambulatory Visit (INDEPENDENT_AMBULATORY_CARE_PROVIDER_SITE_OTHER): Payer: Medicaid Other | Admitting: Obstetrics & Gynecology

## 2018-01-13 ENCOUNTER — Encounter: Payer: Self-pay | Admitting: Obstetrics & Gynecology

## 2018-01-13 ENCOUNTER — Other Ambulatory Visit (HOSPITAL_COMMUNITY)
Admission: RE | Admit: 2018-01-13 | Discharge: 2018-01-13 | Disposition: A | Discharge status: Home / Self Care | Payer: Medicaid Other | Source: Ambulatory Visit | Attending: Obstetrics & Gynecology | Admitting: Obstetrics & Gynecology

## 2018-01-13 VITALS — BP 108/75 | HR 99 | Wt 255.1 lb

## 2018-01-13 DIAGNOSIS — O09219 Supervision of pregnancy with history of pre-term labor, unspecified trimester: Principal | ICD-10-CM

## 2018-01-13 DIAGNOSIS — O09529 Supervision of elderly multigravida, unspecified trimester: Secondary | ICD-10-CM

## 2018-01-13 DIAGNOSIS — O09899 Supervision of other high risk pregnancies, unspecified trimester: Secondary | ICD-10-CM

## 2018-01-13 DIAGNOSIS — O0991 Supervision of high risk pregnancy, unspecified, first trimester: Secondary | ICD-10-CM | POA: Diagnosis not present

## 2018-01-13 DIAGNOSIS — O09211 Supervision of pregnancy with history of pre-term labor, first trimester: Secondary | ICD-10-CM | POA: Diagnosis not present

## 2018-01-13 DIAGNOSIS — Z3A11 11 weeks gestation of pregnancy: Secondary | ICD-10-CM | POA: Insufficient documentation

## 2018-01-13 DIAGNOSIS — O09521 Supervision of elderly multigravida, first trimester: Secondary | ICD-10-CM

## 2018-01-13 DIAGNOSIS — Z641 Problems related to multiparity: Secondary | ICD-10-CM

## 2018-01-13 DIAGNOSIS — G894 Chronic pain syndrome: Secondary | ICD-10-CM

## 2018-01-13 DIAGNOSIS — Z8632 Personal history of gestational diabetes: Secondary | ICD-10-CM

## 2018-01-13 MED ORDER — ASPIRIN EC 81 MG PO TBEC: 81.0000 mg | DELAYED_RELEASE_TABLET | Freq: Every day | ORAL | 2 refills | Status: DC

## 2018-01-13 NOTE — Patient Instructions (Signed)
First Trimester of Pregnancy The first trimester of pregnancy is from week 1 until the end of week 13 (months 1 through 3). A week after a sperm fertilizes an egg, the egg will implant on the wall of the uterus. This embryo will begin to develop into a baby. Genes from you and your partner will form the baby. The female genes will determine whether the baby will be a boy or a girl. At 6-8 weeks, the eyes and face will be formed, and the heartbeat can be seen on ultrasound. At the end of 12 weeks, all the baby's organs will be formed. Now that you are pregnant, you will want to do everything you can to have a healthy baby. Two of the most important things are to get good prenatal care and to follow your health care provider's instructions. Prenatal care is all the medical care you receive before the baby's birth. This care will help prevent, find, and treat any problems during the pregnancy and childbirth. Body changes during your first trimester Your body goes through many changes during pregnancy. The changes vary from woman to woman.  You may gain or lose a couple of pounds at first.  You may feel sick to your stomach (nauseous) and you may throw up (vomit). If the vomiting is uncontrollable, call your health care provider.  You may tire easily.  You may develop headaches that can be relieved by medicines. All medicines should be approved by your health care provider.  You may urinate more often. Painful urination may mean you have a bladder infection.  You may develop heartburn as a result of your pregnancy.  You may develop constipation because certain hormones are causing the muscles that push stool through your intestines to slow down.  You may develop hemorrhoids or swollen veins (varicose veins).  Your breasts may begin to grow larger and become tender. Your nipples may stick out more, and the tissue that surrounds them (areola) may become darker.  Your gums may bleed and may be  sensitive to brushing and flossing.  Dark spots or blotches (chloasma, mask of pregnancy) may develop on your face. This will likely fade after the baby is born.  Your menstrual periods will stop.  You may have a loss of appetite.  You may develop cravings for certain kinds of food.  You may have changes in your emotions from day to day, such as being excited to be pregnant or being concerned that something may go wrong with the pregnancy and baby.  You may have more vivid and strange dreams.  You may have changes in your hair. These can include thickening of your hair, rapid growth, and changes in texture. Some women also have hair loss during or after pregnancy, or hair that feels dry or thin. Your hair will most likely return to normal after your baby is born.  What to expect at prenatal visits During a routine prenatal visit:  You will be weighed to make sure you and the baby are growing normally.  Your blood pressure will be taken.  Your abdomen will be measured to track your baby's growth.  The fetal heartbeat will be listened to between weeks 10 and 14 of your pregnancy.  Test results from any previous visits will be discussed.  Your health care provider may ask you:  How you are feeling.  If you are feeling the baby move.  If you have had any abnormal symptoms, such as leaking fluid, bleeding, severe headaches,   or abdominal cramping.  If you are using any tobacco products, including cigarettes, chewing tobacco, and electronic cigarettes.  If you have any questions.  Other tests that may be performed during your first trimester include:  Blood tests to find your blood type and to check for the presence of any previous infections. The tests will also be used to check for low iron levels (anemia) and protein on red blood cells (Rh antibodies). Depending on your risk factors, or if you previously had diabetes during pregnancy, you may have tests to check for high blood  sugar that affects pregnant women (gestational diabetes).  Urine tests to check for infections, diabetes, or protein in the urine.  An ultrasound to confirm the proper growth and development of the baby.  Fetal screens for spinal cord problems (spina bifida) and Down syndrome.  HIV (human immunodeficiency virus) testing. Routine prenatal testing includes screening for HIV, unless you choose not to have this test.  You may need other tests to make sure you and the baby are doing well.  Follow these instructions at home: Medicines  Follow your health care provider's instructions regarding medicine use. Specific medicines may be either safe or unsafe to take during pregnancy.  Take a prenatal vitamin that contains at least 600 micrograms (mcg) of folic acid.  If you develop constipation, try taking a stool softener if your health care provider approves. Eating and drinking  Eat a balanced diet that includes fresh fruits and vegetables, whole grains, good sources of protein such as meat, eggs, or tofu, and low-fat dairy. Your health care provider will help you determine the amount of weight gain that is right for you.  Avoid raw meat and uncooked cheese. These carry germs that can cause birth defects in the baby.  Eating four or five small meals rather than three large meals a day may help relieve nausea and vomiting. If you start to feel nauseous, eating a few soda crackers can be helpful. Drinking liquids between meals, instead of during meals, also seems to help ease nausea and vomiting.  Limit foods that are high in fat and processed sugars, such as fried and sweet foods.  To prevent constipation: ? Eat foods that are high in fiber, such as fresh fruits and vegetables, whole grains, and beans. ? Drink enough fluid to keep your urine clear or pale yellow. Activity  Exercise only as directed by your health care provider. Most women can continue their usual exercise routine during  pregnancy. Try to exercise for 30 minutes at least 5 days a week. Exercising will help you: ? Control your weight. ? Stay in shape. ? Be prepared for labor and delivery.  Experiencing pain or cramping in the lower abdomen or lower back is a good sign that you should stop exercising. Check with your health care provider before continuing with normal exercises.  Try to avoid standing for long periods of time. Move your legs often if you must stand in one place for a long time.  Avoid heavy lifting.  Wear low-heeled shoes and practice good posture.  You may continue to have sex unless your health care provider tells you not to. Relieving pain and discomfort  Wear a good support bra to relieve breast tenderness.  Take warm sitz baths to soothe any pain or discomfort caused by hemorrhoids. Use hemorrhoid cream if your health care provider approves.  Rest with your legs elevated if you have leg cramps or low back pain.  If you develop   varicose veins in your legs, wear support hose. Elevate your feet for 15 minutes, 3-4 times a day. Limit salt in your diet. Prenatal care  Schedule your prenatal visits by the twelfth week of pregnancy. They are usually scheduled monthly at first, then more often in the last 2 months before delivery.  Write down your questions. Take them to your prenatal visits.  Keep all your prenatal visits as told by your health care provider. This is important. Safety  Wear your seat belt at all times when driving.  Make a list of emergency phone numbers, including numbers for family, friends, the hospital, and police and fire departments. General instructions  Ask your health care provider for a referral to a local prenatal education class. Begin classes no later than the beginning of month 6 of your pregnancy.  Ask for help if you have counseling or nutritional needs during pregnancy. Your health care provider can offer advice or refer you to specialists for help  with various needs.  Do not use hot tubs, steam rooms, or saunas.  Do not douche or use tampons or scented sanitary pads.  Do not cross your legs for long periods of time.  Avoid cat litter boxes and soil used by cats. These carry germs that can cause birth defects in the baby and possibly loss of the fetus by miscarriage or stillbirth.  Avoid all smoking, herbs, alcohol, and medicines not prescribed by your health care provider. Chemicals in these products affect the formation and growth of the baby.  Do not use any products that contain nicotine or tobacco, such as cigarettes and e-cigarettes. If you need help quitting, ask your health care provider. You may receive counseling support and other resources to help you quit.  Schedule a dentist appointment. At home, brush your teeth with a soft toothbrush and be gentle when you floss. Contact a health care provider if:  You have dizziness.  You have mild pelvic cramps, pelvic pressure, or nagging pain in the abdominal area.  You have persistent nausea, vomiting, or diarrhea.  You have a bad smelling vaginal discharge.  You have pain when you urinate.  You notice increased swelling in your face, hands, legs, or ankles.  You are exposed to fifth disease or chickenpox.  You are exposed to German measles (rubella) and have never had it. Get help right away if:  You have a fever.  You are leaking fluid from your vagina.  You have spotting or bleeding from your vagina.  You have severe abdominal cramping or pain.  You have rapid weight gain or loss.  You vomit blood or material that looks like coffee grounds.  You develop a severe headache.  You have shortness of breath.  You have any kind of trauma, such as from a fall or a car accident. Summary  The first trimester of pregnancy is from week 1 until the end of week 13 (months 1 through 3).  Your body goes through many changes during pregnancy. The changes vary from  woman to woman.  You will have routine prenatal visits. During those visits, your health care provider will examine you, discuss any test results you may have, and talk with you about how you are feeling. This information is not intended to replace advice given to you by your health care provider. Make sure you discuss any questions you have with your health care provider. Document Released: 11/17/2001 Document Revised: 11/04/2016 Document Reviewed: 11/04/2016 Elsevier Interactive Patient Education  2018 Elsevier   Inc.  

## 2018-01-13 NOTE — Progress Notes (Signed)
Subjective:   Shelly Lane is a 42 y.o. [id number] at 9125w0d by early 6 week ultrasound being seen today for her first obstetrical visit.  Her obstetrical history is significant for problem list below: Patient Active Problem List   Diagnosis Date Noted  . Drug abuse (HCC) 11/26/2016  . Chronic pain disorder 09/16/2016  . Supervision of high-risk pregnancy 09/08/2016  . History of abnormal cervical Pap smear 10/04/2013  . Elderly multigravida with antepartum condition or complication 02/13/2013  . SAB (spontaneous abortion) x 4 02/13/2013  . Grand multipara 02/13/2013  . Hx of preterm delivery x 2, currently pregnant 02/13/2013  . Hx gestational diabetes x 3 02/13/2013  . Multiple drug allergies 02/13/2013  . BULIMIA 10/21/2007  . ANXIETY 08/02/2007  . SYMPTOM, PAIN, ABDOMINAL, EPIGASTRIC 08/02/2007  . OBESITY 08/01/2007  . DEPRESSION 08/01/2007  Pregnancy history fully reviewed.  Patient reports chronic pain exacerbated by pregnancy. Currently on oxycodone and acetaminophen; narcotic prescribed by [facility]. She said they need a new referral to continue management now that she is pregnant. Marland Kitchen.  HISTORY: Obstetric History   G13  P9   T7   P2   A3   L9    SAB3   TAB0   Ectopic0   Multiple0   Live Births9     # Outcome Date GA Lbr Len/2nd Weight Sex Delivery Anes PTL Lv  13 Current           12 Term 03/27/17 2227w0d 01:21 / 00:02 8 lb 9.2 oz (3.89 kg) F Vag-Spont None  LIV     Name: Sadie HaberWILLIAMSON,GIRL Coby     Apgar1:  8                Apgar5: 9  11 Term 11/15/14 3349w1d 03:17 / 00:07 8 lb 6.2 oz (3.805 kg) F Vag-Spont Local  LIV     Apgar1:  9                Apgar5: 9  10 Term 10/05/13 10661w5d 11:10 / 00:02 6 lb 7.5 oz (2.934 kg) F Vag-Spont None  LIV     Name: Sadie HaberWILLIAMSON,GIRL Dakari     Apgar1:  9                Apgar5: 9  9 SAB 07/09/12 8325w0d         8 SAB 02/07/12 2927w0d         7 Term 01/23/10 3360w6d 25:16 6 lb 13.7 oz (3.11 kg) F Vag-Spont EPI  LIV     Apgar1:  8                 Apgar5: 9  6 Preterm 02/08/09 3235w2d 10:42 6 lb 9.5 oz (2.99 kg) F Vag-Spont   LIV     Name: LAYFA     Apgar1:  9                Apgar5: 9  5 SAB 2008 4427w0d         4 Preterm 02/21/04 335w2d 05:00 6 lb 5 oz (2.863 kg) F Vag-Spont EPI  LIV     Apgar1:  9                Apgar5: 9  3 Term 08/2002 859w0d 21:00 8 lb 15 oz (4.054 kg) M Vag-Spont EPI  LIV     Apgar1:  8  Apgar5: 9  2 Term 04/1999 [redacted]w[redacted]d 05:00 7 lb 9 oz (3.43 kg) M Vag-Spont EPI  LIV  1 Term 11/1997 [redacted]w[redacted]d 12:30 7 lb 6 oz (3.345 kg) F Vag-Spont EPI  LIV      Last pap smear was done 09/2016 and was ASCUS, negative HPV  Past Medical History:  Diagnosis Date  . Abnormal Pap smear    Colpo;Last pap 2012, ASCUS w/ negative HPV  . Anemia    during preg  . Anxiety   . ASCUS with positive high risk HPV   . Depression    ok now  . Eczema   . GERD (gastroesophageal reflux disease)   . Gestational diabetes    diet controlled  . H/O candidiasis   . H/O migraine   . H/O rubella   . H/O varicella   . H/O: depression   . H/O: obesity   . Headache(784.0)   . Hx: UTI (urinary tract infection)   . Miscarriage    3/13 and 7/13   . Preterm labor   . Vaginal Pap smear, abnormal    Past Surgical History:  Procedure Laterality Date  . CHOLECYSTECTOMY  2008  . HERNIA REPAIR  2012  . HIATAL HERNIA REPAIR  2012  . WISDOM TOOTH EXTRACTION     Family History  Problem Relation Age of Onset  . Diabetes Maternal Grandmother   . Hypertension Maternal Grandmother   . Anemia Mother    Social History   Tobacco Use  . Smoking status: Never Smoker  . Smokeless tobacco: Never Used  Substance Use Topics  . Alcohol use: No  . Drug use: No   Allergies  Allergen Reactions  . Vicodin [Hydrocodone-Acetaminophen] Itching   Current Outpatient Medications on File Prior to Visit  Medication Sig Dispense Refill  . clobetasol ointment (TEMOVATE) 0.05 % APPLY TOPICALLY TO THE AFFECTED AREA TWICE DAILY 30 g 0  .  cyclobenzaprine (FLEXERIL) 10 MG tablet Take 10 mg by mouth 3 (three) times daily as needed for muscle spasms.    . fluticasone (FLONASE) 50 MCG/ACT nasal spray Place 2 sprays into both nostrils daily as needed for rhinitis.     Marland Kitchen gabapentin (NEURONTIN) 300 MG capsule Take 1 capsule (300 mg total) by mouth 3 (three) times daily. 90 capsule 5  . hydrOXYzine (VISTARIL) 50 MG capsule Take 1 capsule (50 mg total) by mouth every 6 (six) hours as needed. (Patient taking differently: Take 50 mg by mouth every 6 (six) hours as needed for itching. ) 45 capsule 2  . loratadine (CLARITIN) 10 MG tablet Take 10 mg by mouth daily as needed for allergies.     . Prenatal-Fe Fum-Methf-FA w/o A (VITAFOL-NANO) 18-0.6-0.4 MG TABS Take 1 tablet by mouth daily. 30 tablet 12  . zolpidem (AMBIEN) 10 MG tablet Take 1 tablet (10 mg total) by mouth at bedtime as needed for sleep. 30 tablet 3   No current facility-administered medications on file prior to visit.     Review of Systems Pertinent items noted in HPI and remainder of comprehensive ROS otherwise negative.  Exam   Vitals:   01/13/18 1519  BP: 108/75  Pulse: 99  Weight: 255 lb 1.6 oz (115.7 kg)      Uterus:     Pelvic Exam: Perineum: no hemorrhoids, normal perineum   Vulva: normal external genitalia, no lesions   Vagina:  normal mucosa, normal discharge   Cervix: no lesions and normal   Adnexa: normal adnexa and no mass, fullness,  tenderness   Bony Pelvis: average  System: General: well-developed, well-nourished female in no acute distress   Breast:  normal appearance, no masses or tenderness   Skin: normal coloration and turgor, no rashes   Neurologic: oriented, normal, negative, normal mood   Extremities: normal strength, tone, and muscle mass, ROM of all joints is normal   HEENT PERRLA, extraocular movement intact and sclera clear, anicteric   Mouth/Teeth mucous membranes moist, pharynx normal without lesions and dental hygiene good   Neck  supple and no masses   Cardiovascular: regular rate and rhythm   Respiratory:  no respiratory distress, normal breath sounds   Abdomen: soft, non-tender; bowel sounds normal; no masses,  no organomegaly     Assessment:   Pregnancy: [id number] Patient Active Problem List   Diagnosis Date Noted  . Drug abuse (HCC) 11/26/2016  . Chronic pain disorder 09/16/2016  . Supervision of high-risk pregnancy 09/08/2016  . History of abnormal cervical Pap smear 10/04/2013  . Elderly multigravida with antepartum condition or complication 02/13/2013  . SAB (spontaneous abortion) x 4 02/13/2013  . Grand multipara 02/13/2013  . Hx of preterm delivery x 2, currently pregnant 02/13/2013  . Hx gestational diabetes x 3 02/13/2013  . Multiple drug allergies 02/13/2013  . BULIMIA 10/21/2007  . ANXIETY 08/02/2007  . SYMPTOM, PAIN, ABDOMINAL, EPIGASTRIC 08/02/2007  . OBESITY 08/01/2007  . DEPRESSION 08/01/2007     Plan:  1. Hx of preterm delivery x 2, currently pregnant Declines 17P.   2. Hx gestational diabetes x 2 Never followed up for PP 2 Hr GTT. Will return for early GTT. - Hemoglobin A1c - TSH - Protein / creatinine ratio, urine - aspirin EC 81 MG tablet; Take 1 tablet (81 mg total) by mouth daily. Take after 12 weeks for prevention of preeclampsia later in pregnancy  Dispense: 300 tablet; Refill: 2  3. Chronic pain disorder - Ambulatory referral to Pain Clinic  4. Elderly multigravida with antepartum condition or complication 5. Grand multipara 6. Supervision of high risk pregnancy in first trimester Initial labs drawn. - Obstetric Panel, Including HIV - Culture, OB Urine - Cervicovaginal ancillary only - Genetic Screening - Cystic Fibrosis Mutation 97 - SMN1 COPY NUMBER ANALYSIS (SMA Carrier Screen) - Comprehensive metabolic panel - Hemoglobin A1c - TSH - Protein / creatinine ratio, urine Continue prenatal vitamins. Genetic Screening discussed, NIPS: ordered. Ultrasound  discussed; fetal anatomic survey: to be ordered later. Problem list reviewed and updated. The nature of [facility] with multiple MDs and other Advanced Practice Providers was explained to patient; also emphasized that residents, students are part of our team. Routine obstetric precautions reviewed. Return in about 4 weeks (around 02/10/2018) for OB Visit, but will come for GTT ASAP.     Jaynie Collins, MD, FACOG Obstetrician & Gynecologist, [facility] for [employer], [facility]

## 2018-01-14 LAB — COMPREHENSIVE METABOLIC PANEL
ALT: 17 IU/L (ref 0–32)
AST: 13 IU/L (ref 0–40)
Albumin/Globulin Ratio: 1.6 (ref 1.2–2.2)
Albumin: 4.1 g/dL (ref 3.5–5.5)
Alkaline Phosphatase: 62 IU/L (ref 39–117)
BUN/Creatinine Ratio: 13 (ref 9–23)
BUN: 7 mg/dL (ref 6–20)
Bilirubin Total: 0.5 mg/dL (ref 0.0–1.2)
CO2: 22 mmol/L (ref 20–29)
Calcium: 9.7 mg/dL (ref 8.7–10.2)
Chloride: 102 mmol/L (ref 96–106)
Creatinine, Ser: 0.56 mg/dL — ABNORMAL LOW (ref 0.57–1.00)
GFR calc Af Amer: 136 mL/min/{1.73_m2} (ref >59)
GFR calc non Af Amer: 118 mL/min/{1.73_m2} (ref >59)
Globulin, Total: 2.5 g/dL (ref 1.5–4.5)
Glucose: 124 mg/dL — ABNORMAL HIGH (ref 65–99)
Potassium: 4 mmol/L (ref 3.5–5.2)
Sodium: 137 mmol/L (ref 134–144)
Total Protein: 6.6 g/dL (ref 6.0–8.5)

## 2018-01-14 LAB — TSH: TSH: 0.698 u[IU]/mL (ref 0.450–4.500)

## 2018-01-14 LAB — OBSTETRIC PANEL, INCLUDING HIV
Antibody Screen: NEGATIVE
Basophils Absolute: 0 10*3/uL (ref 0.0–0.2)
Basos: 1 %
EOS (ABSOLUTE): 0.1 10*3/uL (ref 0.0–0.4)
Eos: 1 %
HIV Screen 4th Generation wRfx: NONREACTIVE
Hematocrit: 38.2 % (ref 34.0–46.6)
Hemoglobin: 12.8 g/dL (ref 11.1–15.9)
Hepatitis B Surface Ag: NEGATIVE
Immature Grans (Abs): 0 10*3/uL (ref 0.0–0.1)
Immature Granulocytes: 0 %
Lymphocytes Absolute: 2 10*3/uL (ref 0.7–3.1)
Lymphs: 29 %
MCH: 28.7 pg (ref 26.6–33.0)
MCHC: 33.5 g/dL (ref 31.5–35.7)
MCV: 86 fL (ref 79–97)
Monocytes Absolute: 0.3 10*3/uL (ref 0.1–0.9)
Monocytes: 5 %
Neutrophils Absolute: 4.6 10*3/uL (ref 1.4–7.0)
Neutrophils: 64 %
Platelets: 291 10*3/uL (ref 150–379)
RBC: 4.46 x10E6/uL (ref 3.77–5.28)
RDW: 14.9 % (ref 12.3–15.4)
RPR Ser Ql: NONREACTIVE
Rh Factor: POSITIVE
Rubella Antibodies, IGG: 2.32 {index}
WBC: 7.1 10*3/uL (ref 3.4–10.8)

## 2018-01-14 LAB — CERVICOVAGINAL ANCILLARY ONLY
Bacterial vaginitis: NEGATIVE
Candida vaginitis: POSITIVE — AB
Chlamydia: NEGATIVE
Neisseria Gonorrhea: NEGATIVE
Trichomonas: NEGATIVE

## 2018-01-14 LAB — PROTEIN / CREATININE RATIO, URINE
Creatinine, Urine: 139.3 mg/dL
Protein, Ur: 26.4 mg/dL
Protein/Creat Ratio: 190 mg/g{creat} (ref 0–200)

## 2018-01-14 LAB — HEMOGLOBIN A1C
Est. average glucose Bld gHb Est-mCnc: 103 mg/dL
Hgb A1c MFr Bld: 5.2 % (ref 4.8–5.6)

## 2018-01-15 LAB — URINE CULTURE, OB REFLEX

## 2018-01-15 LAB — CULTURE, OB URINE

## 2018-01-17 ENCOUNTER — Other Ambulatory Visit: Payer: Medicaid Other

## 2018-01-17 DIAGNOSIS — Z8632 Personal history of gestational diabetes: Secondary | ICD-10-CM

## 2018-01-18 ENCOUNTER — Other Ambulatory Visit: Payer: Self-pay | Admitting: Obstetrics & Gynecology

## 2018-01-18 DIAGNOSIS — O26891 Other specified pregnancy related conditions, first trimester: Principal | ICD-10-CM

## 2018-01-18 DIAGNOSIS — N898 Other specified noninflammatory disorders of vagina: Secondary | ICD-10-CM

## 2018-01-18 LAB — GLUCOSE TOLERANCE, 2 HOURS W/ 1HR
Glucose, 1 hour: 136 mg/dL (ref 65–179)
Glucose, 2 hour: 120 mg/dL (ref 65–152)
Glucose, Fasting: 92 mg/dL — ABNORMAL HIGH (ref 65–91)

## 2018-01-18 MED ORDER — TERCONAZOLE 0.4 % VA CREA: 1.0000 | TOPICAL_CREAM | Freq: Every day | VAGINAL | 0 refills | Status: DC

## 2018-01-21 LAB — SMN1 COPY NUMBER ANALYSIS (SMA CARRIER SCREENING)

## 2018-01-22 LAB — CYSTIC FIBROSIS MUTATION 97: Interpretation: NOT DETECTED

## 2018-02-02 ENCOUNTER — Telehealth: Payer: Self-pay

## 2018-02-02 NOTE — Telephone Encounter (Signed)
Returned patient's call, gave her the results of her labs.

## 2018-02-10 ENCOUNTER — Encounter: Payer: Self-pay | Admitting: Obstetrics and Gynecology

## 2018-02-10 ENCOUNTER — Ambulatory Visit (INDEPENDENT_AMBULATORY_CARE_PROVIDER_SITE_OTHER): Payer: Medicaid Other | Admitting: Obstetrics and Gynecology

## 2018-02-10 VITALS — BP 117/82 | HR 106 | Wt 258.3 lb

## 2018-02-10 DIAGNOSIS — E119 Type 2 diabetes mellitus without complications: Secondary | ICD-10-CM | POA: Insufficient documentation

## 2018-02-10 DIAGNOSIS — Z8632 Personal history of gestational diabetes: Secondary | ICD-10-CM

## 2018-02-10 DIAGNOSIS — O0991 Supervision of high risk pregnancy, unspecified, first trimester: Secondary | ICD-10-CM

## 2018-02-10 DIAGNOSIS — G894 Chronic pain syndrome: Secondary | ICD-10-CM

## 2018-02-10 DIAGNOSIS — O09219 Supervision of pregnancy with history of pre-term labor, unspecified trimester: Secondary | ICD-10-CM

## 2018-02-10 DIAGNOSIS — O09899 Supervision of other high risk pregnancies, unspecified trimester: Secondary | ICD-10-CM

## 2018-02-10 NOTE — Progress Notes (Signed)
   PRENATAL VISIT NOTE  Subjective:  Shelly Lane is a 42 y.o. [id number] at 384w0d being seen today for ongoing prenatal care.  She is currently monitored for the following issues for this high-risk pregnancy and has OBESITY; ANXIETY; BULIMIA; DEPRESSION; SYMPTOM, PAIN, ABDOMINAL, EPIGASTRIC; Elderly multigravida with antepartum condition or complication; SAB (spontaneous abortion) x 4; Grand multipara; Hx of preterm delivery x 2, currently pregnant; Hx gestational diabetes x 3; Multiple drug allergies; History of abnormal cervical Pap smear; Supervision of high-risk pregnancy; Chronic pain disorder; Drug abuse (HCC); and DM (diabetes mellitus), type 2 (HCC) on their problem list.   Patient reports no complaints.  Contractions: Not present. Vag. Bleeding: None.  Movement: Present. Denies leaking of fluid.   The following portions of the patient's history were reviewed and updated as appropriate: allergies, current medications, past family history, past medical history, past social history, past surgical history and problem list. Problem list updated.  Objective:   Vitals:   02/10/18 1647  BP: 117/82  Pulse: (!) 106  Weight: 258 lb 4.8 oz (117.2 kg)    Fetal Status: Fetal Heart Rate (bpm): 152   Movement: Present     General:  Alert, oriented and cooperative. Patient is in no acute distress.  Skin: Skin is warm and dry. No rash noted.   Cardiovascular: Normal heart rate noted  Respiratory: Normal respiratory effort, no problems with respiration noted  Abdomen: Soft, gravid, appropriate for gestational age.  Pain/Pressure: Absent     Pelvic: Cervical exam deferred        Extremities: Normal range of motion.  Edema: None  Mental Status:  Normal mood and affect. Normal behavior. Normal judgment and thought content.   Assessment and Plan:  Pregnancy: [id number] at 724w0d  1. Supervision of high risk pregnancy in first trimester  2. Hx of preterm delivery x 2, currently  pregnant Declined 17 P previously  3. Hx gestational diabetes x 3 Positive early 2 hr, likely T2DM Referred to nutrition and diabetes management Patient initially very resistant to the idea that she has diabetes, states she doesn't believe the test results and that one elevated number could make her qualify, wondering why she had normal 3 hr with previous pregnancies but is now elevated. Reviewed that diabetes can present at any time and this is the test our practice currently uses and that by definition of this test, she has diabetes, which she accepts. She agrees to come to nutrition and counseling, gave her new meter with strips and lancets today, she will start checking blood sugars  4. Type 2 diabetes mellitus without complication, without long-term current use of insulin (HCC) One elevated number with early 2 hr GTT A1C 5.2%  5. Chronic pain disorder Previously referred to pain clinic, goes 1/month Taking oxycodone 5 mg TID   Preterm labor symptoms and general obstetric precautions including but not limited to vaginal bleeding, contractions, leaking of fluid and fetal movement were reviewed in detail with the patient. Please refer to After Visit Summary for other counseling recommendations.  Return in about 4 weeks (around 03/10/2018).   Conan BowensKelly M Chelsey Redondo, MD

## 2018-02-28 ENCOUNTER — Encounter (HOSPITAL_COMMUNITY): Payer: Self-pay | Admitting: Obstetrics and Gynecology

## 2018-03-04 ENCOUNTER — Other Ambulatory Visit: Payer: Self-pay | Admitting: Certified Nurse Midwife

## 2018-03-04 ENCOUNTER — Encounter (HOSPITAL_COMMUNITY): Payer: Self-pay

## 2018-03-04 ENCOUNTER — Ambulatory Visit (HOSPITAL_COMMUNITY)
Admission: RE | Admit: 2018-03-04 | Discharge: 2018-03-04 | Disposition: A | Discharge status: Home / Self Care | Payer: Medicaid Other | Source: Ambulatory Visit | Attending: Obstetrics and Gynecology | Admitting: Obstetrics and Gynecology

## 2018-03-04 DIAGNOSIS — N760 Acute vaginitis: Principal | ICD-10-CM

## 2018-03-04 DIAGNOSIS — E119 Type 2 diabetes mellitus without complications: Secondary | ICD-10-CM

## 2018-03-04 DIAGNOSIS — O24112 Pre-existing diabetes mellitus, type 2, in pregnancy, second trimester: Secondary | ICD-10-CM | POA: Diagnosis not present

## 2018-03-04 DIAGNOSIS — B9689 Other specified bacterial agents as the cause of diseases classified elsewhere: Secondary | ICD-10-CM

## 2018-03-04 DIAGNOSIS — Z3689 Encounter for other specified antenatal screening: Secondary | ICD-10-CM | POA: Insufficient documentation

## 2018-03-04 DIAGNOSIS — Z3A18 18 weeks gestation of pregnancy: Secondary | ICD-10-CM | POA: Diagnosis not present

## 2018-03-04 DIAGNOSIS — O99212 Obesity complicating pregnancy, second trimester: Secondary | ICD-10-CM | POA: Diagnosis present

## 2018-03-04 MED ORDER — METRONIDAZOLE 0.75 % VA GEL: 1.0000 | Freq: Two times a day (BID) | VAGINAL | 0 refills | Status: DC

## 2018-03-04 NOTE — Addendum Note (Signed)
Encounter addended by: Levonne HubertStalter, Zira Helinski M, RDMS, RVT on: 03/04/2018 5:00 PM  Actions taken: Imaging Exam ended

## 2018-03-07 ENCOUNTER — Other Ambulatory Visit: Payer: Self-pay

## 2018-03-07 ENCOUNTER — Other Ambulatory Visit (HOSPITAL_COMMUNITY): Payer: Self-pay | Admitting: *Deleted

## 2018-03-07 DIAGNOSIS — O24410 Gestational diabetes mellitus in pregnancy, diet controlled: Secondary | ICD-10-CM

## 2018-03-07 DIAGNOSIS — E119 Type 2 diabetes mellitus without complications: Secondary | ICD-10-CM

## 2018-03-10 ENCOUNTER — Ambulatory Visit (INDEPENDENT_AMBULATORY_CARE_PROVIDER_SITE_OTHER): Payer: Medicaid Other | Admitting: Obstetrics and Gynecology

## 2018-03-10 VITALS — Wt 270.0 lb

## 2018-03-10 DIAGNOSIS — O0992 Supervision of high risk pregnancy, unspecified, second trimester: Secondary | ICD-10-CM

## 2018-03-10 DIAGNOSIS — O0993 Supervision of high risk pregnancy, unspecified, third trimester: Secondary | ICD-10-CM

## 2018-03-10 DIAGNOSIS — F329 Major depressive disorder, single episode, unspecified: Secondary | ICD-10-CM

## 2018-03-10 DIAGNOSIS — O09529 Supervision of elderly multigravida, unspecified trimester: Secondary | ICD-10-CM

## 2018-03-10 DIAGNOSIS — O99340 Other mental disorders complicating pregnancy, unspecified trimester: Secondary | ICD-10-CM

## 2018-03-10 DIAGNOSIS — E118 Type 2 diabetes mellitus with unspecified complications: Secondary | ICD-10-CM

## 2018-03-10 DIAGNOSIS — O09899 Supervision of other high risk pregnancies, unspecified trimester: Secondary | ICD-10-CM

## 2018-03-10 DIAGNOSIS — O09219 Supervision of pregnancy with history of pre-term labor, unspecified trimester: Secondary | ICD-10-CM

## 2018-03-10 MED ORDER — GLUCOSE BLOOD VI STRP: ORAL_STRIP | 12 refills | Status: DC

## 2018-03-10 MED ORDER — HYDROXYZINE PAMOATE 50 MG PO CAPS: 50.0000 mg | ORAL_CAPSULE | Freq: Four times a day (QID) | ORAL | 2 refills | Status: DC | PRN

## 2018-03-10 MED ORDER — FLUOXETINE HCL 20 MG PO CAPS: 20.0000 mg | ORAL_CAPSULE | Freq: Every day | ORAL | 3 refills | Status: DC

## 2018-03-10 MED ORDER — ZOLPIDEM TARTRATE 5 MG PO TABS: 5.0000 mg | ORAL_TABLET | Freq: Every evening | ORAL | 1 refills | Status: DC | PRN

## 2018-03-10 MED ORDER — ACCU-CHEK NANO SMARTVIEW W/DEVICE KIT: 1.0000 | PACK | 0 refills | Status: DC

## 2018-03-10 MED ORDER — ACCU-CHEK FASTCLIX LANCETS MISC
1.0000 [IU] | Freq: Four times a day (QID) | 12 refills | Status: DC
Start: 2018-03-10 — End: 2018-07-01

## 2018-03-10 NOTE — Progress Notes (Signed)
   PRENATAL VISIT NOTE  Subjective:  Shelly Lane is a 42 y.o. [id number] at 4448w0d being seen today for ongoing prenatal care.  She is currently monitored for the following issues for this high-risk pregnancy and has OBESITY; ANXIETY; BULIMIA; DEPRESSION; SYMPTOM, PAIN, ABDOMINAL, EPIGASTRIC; Elderly multigravida with antepartum condition or complication; SAB (spontaneous abortion) x 4; Grand multipara; Hx of preterm delivery x 2, currently pregnant; Hx gestational diabetes x 3; Multiple drug allergies; History of abnormal cervical Pap smear; Supervision of high-risk pregnancy; Chronic pain disorder; Drug abuse (HCC); and DM (diabetes mellitus), type 2 (HCC) on their problem list.  Patient reports some depression symptoms and insomnia. She denies any suicidal/homicidal ideations.  Contractions: Not present. Vag. Bleeding: None.  Movement: Present. Denies leaking of fluid.   The following portions of the patient's history were reviewed and updated as appropriate: allergies, current medications, past family history, past medical history, past social history, past surgical history and problem list. Problem list updated.  Objective:   Vitals:   03/10/18 1622  Weight: 270 lb (122.5 kg)    Fetal Status: Fetal Heart Rate (bpm): 150   Movement: Present     General:  Alert, oriented and cooperative. Patient is in no acute distress.  Skin: Skin is warm and dry. No rash noted.   Cardiovascular: Normal heart rate noted  Respiratory: Normal respiratory effort, no problems with respiration noted  Abdomen: Soft, gravid, appropriate for gestational age.  Pain/Pressure: Absent     Pelvic: Cervical exam deferred        Extremities: Normal range of motion.  Edema: None  Mental Status: Normal mood and affect. Normal behavior. Normal judgment and thought content.   Assessment and Plan:  Pregnancy: [id number] at 6348w0d  1. Supervision of high risk pregnancy in second trimester Rx prozac provider (patient  states zoloft does not work) Rx Facilities managerambien provided Patient referred to see Asher MuirJamie  2. Type 2 diabetes mellitus with complication, unspecified whether long term insulin use (HCC) Patient did not bring CBG log and reports running out of testing strips She was on glyburide with the last pregnancy and insulin in previous pregnancies Discussed the benefits of omnipod- patient is interested. [facility] Follow up growth ultrasound scheduled as well  3. Hx of preterm delivery x 2, currently pregnant Declined 17-P  4. Elderly multigravida with antepartum condition or complication Normal NIPS  5. Depression affecting pregnancy  - Amb ref to Integrated Behavioral Health  6. Supervision of high risk pregnancy in third trimester - hydrOXYzine (VISTARIL) 50 MG capsule; Take 1 capsule (50 mg total) by mouth every 6 (six) hours as needed.  Dispense: 45 capsule; Refill: 2  Preterm labor symptoms and general obstetric precautions including but not limited to vaginal bleeding, contractions, leaking of fluid and fetal movement were reviewed in detail with the patient. Please refer to After Visit Summary for other counseling recommendations.  No follow-ups on file.  Future Appointments  Date Time Provider Department Center  03/21/2018  3:30 PM Harl Wiechmann, Gigi GinPeggy, MD [facility] None  04/01/2018  3:30 PM [facility] US 1 [facility]    Catalina AntiguaPeggy Jonmarc Bodkin, MD

## 2018-03-10 NOTE — Progress Notes (Signed)
Last 3 weeks pt states she has noticed a increase in depression and anxiety Pt states she had a panic attack last Friday husband was present to help comfort her.

## 2018-03-11 ENCOUNTER — Other Ambulatory Visit: Payer: Self-pay | Admitting: Obstetrics and Gynecology

## 2018-03-15 ENCOUNTER — Other Ambulatory Visit: Payer: Medicaid Other

## 2018-03-15 ENCOUNTER — Institutional Professional Consult (permissible substitution): Payer: Medicaid Other

## 2018-03-15 NOTE — BH Specialist Note (Deleted)
Integrated Behavioral Health Initial Visit  MRN: [id number] Name: Shelly Lane  Number of Integrated Behavioral Health Clinician visits:: 1/6 Session Start time: ***  Session End time: *** Total time: {IBH Total Time:21014050}  Type of Service: Integrated Behavioral Health- Individual/Family Interpretor:No. Interpretor Name and Language: n/a   Warm Hand Off Completed.       SUBJECTIVE: Shelly Lane is a 42 y.o. female accompanied by {CHL AMB ACCOMPANIED BY:[phone]} Patient was referred by Dr Jolayne Pantheronstant for depression Patient reports the following symptoms/concerns: *** Duration of problem: ***; Severity of problem: {Mild/Moderate/Severe:20260}  OBJECTIVE: Mood: {BHH MOOD:22306} and Affect: {BHH AFFECT:22307} Risk of harm to self or others: No plan to harm self or others ***  LIFE CONTEXT: Family and Social: *** School/Work: *** Self-Care: *** Life Changes: Current pregnancy  GOALS ADDRESSED: Patient will: 1. Reduce symptoms of: {IBH Symptoms:21014056} 2. Increase knowledge and/or ability of: {IBH Patient Tools:21014057}  3. Demonstrate ability to: {IBH Goals:21014053}  INTERVENTIONS: Interventions utilized: {IBH Interventions:21014054}  Standardized Assessments completed: {IBH Screening Tools:21014051}  ASSESSMENT: Patient currently experiencing ***.   Patient may benefit from ***.  PLAN: 1. Follow up with behavioral health clinician on : *** 2. Behavioral recommendations: *** 3. Referral(s): {IBH Referrals:21014055} 4. "From scale of 1-10, how likely are you to follow plan?": ***  Valetta CloseJamie C Arlene Genova, LCSW

## 2018-03-17 ENCOUNTER — Ambulatory Visit (INDEPENDENT_AMBULATORY_CARE_PROVIDER_SITE_OTHER): Payer: Medicaid Other | Admitting: Clinical

## 2018-03-17 ENCOUNTER — Encounter: Payer: Medicaid Other | Attending: Obstetrics and Gynecology | Admitting: *Deleted

## 2018-03-17 ENCOUNTER — Ambulatory Visit: Payer: Medicaid Other | Admitting: *Deleted

## 2018-03-17 DIAGNOSIS — Z713 Dietary counseling and surveillance: Secondary | ICD-10-CM | POA: Diagnosis not present

## 2018-03-17 DIAGNOSIS — Z8632 Personal history of gestational diabetes: Secondary | ICD-10-CM

## 2018-03-17 DIAGNOSIS — F4323 Adjustment disorder with mixed anxiety and depressed mood: Secondary | ICD-10-CM

## 2018-03-17 NOTE — Progress Notes (Signed)
Patient was seen on 03/17/2018 for type 2 Diabetes and pregnancy self-management. EDD 08/04/2018. Patient states history of GDM diabetes for 3 of her pregnancies.  She was agitated at beginning of our appointment because she didn't believe she has diabetes. Her diet history revealed that she is omitting all carbohydrates in effort to keep BG within normal ranges. Otherwise patient appears to eat good variety of all food groups and beverages include water and diet soda.  Her comfort level with carb counting is 5/10. 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 Her first meal is around 5 PM, and next meal around 9:30 PM. Otherwise she snacks intermittently.Patient is currently on no diabetes medications. Last A1c was 5.2% on 01/13/2018  The following learning objectives were met by the patient :   States the definition of 2 Diabetes and pregnancy   States why dietary management is important in controlling blood glucose  Describes the effects of carbohydrates on blood glucose levels  Demonstrates ability to create a balanced meal plan  Demonstrates carbohydrate counting   States when to check blood glucose levels  Demonstrates proper blood glucose monitoring techniques  Provided information on effect of stress and exercise on blood glucose levels  States the importance of limiting caffeine and abstaining from alcohol and smoking  I explained to patient that if she didn't have diabetes, her body would keep her BG within normal limits even if she ate larger amounts of food. This seemed to make an impression and she was then willing to discuss the dietary guidelines and acknowledge that by testing her BG, she would have the data to know if she needed medication or not.   Plan:   Aim for 3 Carb Choices per meal (45 grams) +/- 1 either way   Aim for 1-2 Carbs per snack  Consider  increasing your activity level by walking or other activity daily as tolerated  Begin  checking BG before breakfast and 2 hours after first bite of breakfast, lunch and dinner as directed by MD   Bring Log Book/Sheet to every medical appointment    Take medication as directed by MD  Patient already has a meter: Accu Check Nano And is testing pre breakfast and 2 hours each meal as directed by MD Review of meter history shows: fasting and post meal BG within target ranges so far. (but she has been avoiding carbohydrates completely)  Patient states that she was shown the Omnipod at her last OB appointment. She declines to discuss it today, preferring to wait until she knows if she needs insulin.  Patient instructed to monitor glucose levels: FBS: 60 - 95 mg/dl 2 hour: <120 mg/dl  Patient received the following handouts:  Nutrition Diabetes and Pregnancy  Carbohydrate Counting List  Patient will be seen for follow-up as needed.

## 2018-03-17 NOTE — BH Specialist Note (Signed)
Integrated Behavioral Health Initial Visit  MRN: [id number] Name: Shelly Lane  Number of Integrated Behavioral Health Clinician visits:: 1/6 Session Start time: 4:48  Session End time: 5:23 Total time: 30 minutes  Type of Service: Integrated Behavioral Health- Individual/Family Interpretor:No. Interpretor Name and Language: n/a   Warm Hand Off Completed.       SUBJECTIVE: Shelly Lane is a 42 y.o. female accompanied by n/a Patient was referred by Dr Jolayne Pantheronstant for depression . Patient reports the following symptoms/concerns: Pt states she is feeling depressed and anxious this pregnancy, with difficulty falling asleep and fatigue. Pt began taking Prozac two days ago, and did not like how it made her feel the first day, so is taking at bedtime instead. Pt says it helps to talk about her feelings. 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 Reduce symptoms of: anxiety and depression 2. Increase knowledge and/or ability of: self-management skills  3. Demonstrate ability to: Increase healthy adjustment to current life circumstances  INTERVENTIONS: Interventions utilized: Medication Monitoring, Sleep Hygiene and Psychoeducation and/or Health Education  Standardized Assessments completed: Patient declined screening  ASSESSMENT: Patient currently experiencing Adjustment disorder with mixed anxious and depressed mood   Patient may benefit from psychoeducation and brief therapeutic interventions regarding coping with symptoms of anxiety and  depression .  PLAN: 1. Follow up with behavioral health clinician on : Two weeks 2. Behavioral recommendations:  -Continue taking Prozac, as prescribed by medical provider -Consider spending 10-20 minutes outdoors every morning (covered sunlight), and apps as additional self-coping -Read educational materials regarding coping with symptoms of depression and anxiety   3. Referral(s): [facility] 4. "From scale of 1-10, how likely are you to follow plan?": 8  Jamie C McMannes, LCSW

## 2018-03-21 ENCOUNTER — Encounter: Payer: Medicaid Other | Admitting: Obstetrics and Gynecology

## 2018-03-29 ENCOUNTER — Ambulatory Visit (INDEPENDENT_AMBULATORY_CARE_PROVIDER_SITE_OTHER): Payer: Medicaid Other | Admitting: Obstetrics and Gynecology

## 2018-03-29 ENCOUNTER — Encounter: Payer: Self-pay | Admitting: Obstetrics and Gynecology

## 2018-03-29 VITALS — BP 107/74 | HR 116 | Wt 263.4 lb

## 2018-03-29 DIAGNOSIS — F329 Major depressive disorder, single episode, unspecified: Secondary | ICD-10-CM

## 2018-03-29 DIAGNOSIS — O09219 Supervision of pregnancy with history of pre-term labor, unspecified trimester: Secondary | ICD-10-CM

## 2018-03-29 DIAGNOSIS — F32A Depression, unspecified: Secondary | ICD-10-CM

## 2018-03-29 DIAGNOSIS — O99340 Other mental disorders complicating pregnancy, unspecified trimester: Secondary | ICD-10-CM

## 2018-03-29 DIAGNOSIS — E118 Type 2 diabetes mellitus with unspecified complications: Secondary | ICD-10-CM

## 2018-03-29 DIAGNOSIS — O0992 Supervision of high risk pregnancy, unspecified, second trimester: Secondary | ICD-10-CM

## 2018-03-29 DIAGNOSIS — O09899 Supervision of other high risk pregnancies, unspecified trimester: Secondary | ICD-10-CM

## 2018-03-29 NOTE — Progress Notes (Signed)
Pt states that she had some spotting about a week ago with some contractions. Bleeding only lasted for about a half a day only when she wiped.

## 2018-03-29 NOTE — Progress Notes (Signed)
   PRENATAL VISIT NOTE  Subjective:  Shelly Lane is a 42 y.o. [id number] at 11036w5d being seen today for ongoing prenatal care.  She is currently monitored for the following issues for this high-risk pregnancy and has OBESITY; ANXIETY; BULIMIA; Depression affecting pregnancy, antepartum; SYMPTOM, PAIN, ABDOMINAL, EPIGASTRIC; Elderly multigravida with antepartum condition or complication; SAB (spontaneous abortion) x 4; Grand multipara; Hx of preterm delivery x 2, currently pregnant; Hx gestational diabetes x 3; Multiple drug allergies; History of abnormal cervical Pap smear; Supervision of high-risk pregnancy; Chronic pain disorder; Drug abuse (HCC); and DM (diabetes mellitus), type 2 (HCC) on their problem list.  Patient reports no complaints.  Contractions: Irregular. Vag. Bleeding: None.  Movement: Present. Denies leaking of fluid.   The following portions of the patient's history were reviewed and updated as appropriate: allergies, current medications, past family history, past medical history, past social history, past surgical history and problem list. Problem list updated.  Objective:   Vitals:   03/29/18 1606  BP: 107/74  Pulse: (!) 116  Weight: 263 lb 6.4 oz (119.5 kg)    Fetal Status: Fetal Heart Rate (bpm): 145 Fundal Height: 22 cm Movement: Present     General:  Alert, oriented and cooperative. Patient is in no acute distress.  Skin: Skin is warm and dry. No rash noted.   Cardiovascular: Normal heart rate noted  Respiratory: Normal respiratory effort, no problems with respiration noted  Abdomen: Soft, gravid, appropriate for gestational age.  Pain/Pressure: Absent     Pelvic: Cervical exam deferred        Extremities: Normal range of motion.  Edema: Trace  Mental Status: Normal mood and affect. Normal behavior. Normal judgment and thought content.   Assessment and Plan:  Pregnancy: [id number] at 7636w5d  1. Supervision of high risk pregnancy in second trimester Patient is  doing well, no complaints today but reports an episode of vaginal spotting last week when she wiped. Spotting lasted half a day and was noticeable when wiping Follow up ultrasound on 4/26  2. Type 2 diabetes mellitus with complication, unspecified whether long term insulin use (HCC) CBGs reviewed and great majority within range Continue diet control Congratulated patient on her efforts and encouraged her to continue 7lb weight loss with dietary changes Follow up fetal echo  3. Hx of preterm delivery x 2, currently pregnant Patient declined 17-P  4. Depression affecting pregnancy, antepartum Currently on Prozac Seen by Integrated behavioral health  Preterm labor symptoms and general obstetric precautions including but not limited to vaginal bleeding, contractions, leaking of fluid and fetal movement were reviewed in detail with the patient. Please refer to After Visit Summary for other counseling recommendations.  Return in about 3 weeks (around 04/19/2018) for ROB.  Future Appointments  Date Time Provider Department Center  04/01/2018  3:30 PM [facility] US 1 [facility]  04/19/2018  4:15 PM Allie Bossierove, Myra C, MD [facility] None    Catalina AntiguaPeggy Laira Penninger, MD

## 2018-04-01 ENCOUNTER — Encounter (HOSPITAL_COMMUNITY): Payer: Self-pay

## 2018-04-01 ENCOUNTER — Other Ambulatory Visit (HOSPITAL_COMMUNITY): Payer: Self-pay | Admitting: Obstetrics and Gynecology

## 2018-04-01 ENCOUNTER — Ambulatory Visit (HOSPITAL_COMMUNITY)
Admission: RE | Admit: 2018-04-01 | Discharge: 2018-04-01 | Disposition: A | Discharge status: Home / Self Care | Payer: Medicaid Other | Source: Ambulatory Visit | Attending: Obstetrics | Admitting: Obstetrics

## 2018-04-01 DIAGNOSIS — O09522 Supervision of elderly multigravida, second trimester: Secondary | ICD-10-CM | POA: Insufficient documentation

## 2018-04-01 DIAGNOSIS — O24112 Pre-existing diabetes mellitus, type 2, in pregnancy, second trimester: Secondary | ICD-10-CM | POA: Insufficient documentation

## 2018-04-01 DIAGNOSIS — E118 Type 2 diabetes mellitus with unspecified complications: Secondary | ICD-10-CM

## 2018-04-01 DIAGNOSIS — O24410 Gestational diabetes mellitus in pregnancy, diet controlled: Secondary | ICD-10-CM

## 2018-04-01 DIAGNOSIS — Z362 Encounter for other antenatal screening follow-up: Secondary | ICD-10-CM

## 2018-04-01 DIAGNOSIS — O99212 Obesity complicating pregnancy, second trimester: Secondary | ICD-10-CM

## 2018-04-01 DIAGNOSIS — Z3A22 22 weeks gestation of pregnancy: Secondary | ICD-10-CM | POA: Diagnosis not present

## 2018-04-06 ENCOUNTER — Other Ambulatory Visit (HOSPITAL_COMMUNITY): Payer: Self-pay | Admitting: *Deleted

## 2018-04-06 DIAGNOSIS — E119 Type 2 diabetes mellitus without complications: Secondary | ICD-10-CM

## 2018-04-08 ENCOUNTER — Other Ambulatory Visit: Payer: Self-pay | Admitting: Obstetrics and Gynecology

## 2018-04-08 DIAGNOSIS — M546 Pain in thoracic spine: Principal | ICD-10-CM

## 2018-04-08 DIAGNOSIS — G8929 Other chronic pain: Secondary | ICD-10-CM

## 2018-04-13 ENCOUNTER — Telehealth: Payer: Self-pay | Admitting: *Deleted

## 2018-04-13 NOTE — Telephone Encounter (Signed)
Fax from pharmacy for refill on Gabapentin  Rx.   Please send if approved.

## 2018-04-19 ENCOUNTER — Ambulatory Visit (INDEPENDENT_AMBULATORY_CARE_PROVIDER_SITE_OTHER): Payer: Medicaid Other | Admitting: Obstetrics & Gynecology

## 2018-04-19 VITALS — BP 96/68 | HR 105 | Wt 264.7 lb

## 2018-04-19 DIAGNOSIS — O09219 Supervision of pregnancy with history of pre-term labor, unspecified trimester: Secondary | ICD-10-CM

## 2018-04-19 DIAGNOSIS — O09212 Supervision of pregnancy with history of pre-term labor, second trimester: Secondary | ICD-10-CM

## 2018-04-19 DIAGNOSIS — O0992 Supervision of high risk pregnancy, unspecified, second trimester: Secondary | ICD-10-CM

## 2018-04-19 DIAGNOSIS — M5430 Sciatica, unspecified side: Secondary | ICD-10-CM

## 2018-04-19 DIAGNOSIS — O99212 Obesity complicating pregnancy, second trimester: Secondary | ICD-10-CM

## 2018-04-19 DIAGNOSIS — O09899 Supervision of other high risk pregnancies, unspecified trimester: Secondary | ICD-10-CM

## 2018-04-19 DIAGNOSIS — E118 Type 2 diabetes mellitus with unspecified complications: Secondary | ICD-10-CM

## 2018-04-19 MED ORDER — METFORMIN HCL 500 MG PO TABS: ORAL_TABLET | ORAL | 6 refills | Status: DC

## 2018-04-19 NOTE — Progress Notes (Signed)
Patient reports good fetal movement with occasional pressure. 

## 2018-04-19 NOTE — Progress Notes (Signed)
   PRENATAL VISIT NOTE  Subjective:  Shelly Lane is a 42 y.o. [id number] at [redacted]w[redacted]d being seen today for ongoing prenatal care.  She is currently monitored for the following issues for this high-risk pregnancy and has OBESITY; ANXIETY; BULIMIA; Depression affecting pregnancy, antepartum; SYMPTOM, PAIN, ABDOMINAL, EPIGASTRIC; Elderly multigravida with antepartum condition or complication; SAB (spontaneous abortion) x 4; Grand multipara; Hx of preterm delivery x 2, currently pregnant; Hx gestational diabetes x 3; Multiple drug allergies; History of abnormal cervical Pap smear; Supervision of high-risk pregnancy; Chronic pain disorder; Drug abuse (HCC); and DM (diabetes mellitus), type 2 (HCC) on their problem list.  Patient reports sciatica.   .  .   . Denies leaking of fluid.   The following portions of the patient's history were reviewed and updated as appropriate: allergies, current medications, past family history, past medical history, past social history, past surgical history and problem list. Problem list updated.  Objective:  There were no vitals filed for this visit.  Fetal Status:           General:  Alert, oriented and cooperative. Patient is in no acute distress.  Skin: Skin is warm and dry. No rash noted.   Cardiovascular: Normal heart rate noted  Respiratory: Normal respiratory effort, no problems with respiration noted  Abdomen: Soft, gravid, appropriate for gestational age.        Pelvic: Cervical exam deferred        Extremities: Normal range of motion.     Mental Status: Normal mood and affect. Normal behavior. Normal judgment and thought content.   Assessment and Plan:  Pregnancy: [id number] at [redacted]w[redacted]d  1. Type 2 diabetes mellitus with complication, unspecified whether long term insulin use (HCC) Her sugars after meals are fine but her fastings are always about 100. I will start metformin qhs  2. Hx of preterm delivery x 2, currently pregnant - she declines 17P  3.  Class 2 severe obesity due to excess calories with serious comorbidity in adult, unspecified BMI (HCC) - She reports that she is doing well with her diet  4. Supervision of high risk pregnancy in second trimester - has follow up MFM u/s next month  5. Sciatica- refer to chiropractor  Preterm labor symptoms and general obstetric precautions including but not limited to vaginal bleeding, contractions, leaking of fluid and fetal movement were reviewed in detail with the patient. Please refer to After Visit Summary for other counseling recommendations.  No follow-ups on file.  Future Appointments  Date Time Provider Department Center  05/13/2018  3:30 PM [facility] [place] 1 [facility]    Allie Bossier, MD

## 2018-05-09 ENCOUNTER — Encounter: Payer: Self-pay | Admitting: Obstetrics and Gynecology

## 2018-05-09 ENCOUNTER — Ambulatory Visit (INDEPENDENT_AMBULATORY_CARE_PROVIDER_SITE_OTHER): Payer: Medicaid Other | Admitting: Obstetrics and Gynecology

## 2018-05-09 ENCOUNTER — Other Ambulatory Visit: Payer: Self-pay

## 2018-05-09 VITALS — BP 104/67 | HR 127 | Wt 276.0 lb

## 2018-05-09 DIAGNOSIS — Z641 Problems related to multiparity: Secondary | ICD-10-CM

## 2018-05-09 DIAGNOSIS — M5431 Sciatica, right side: Secondary | ICD-10-CM

## 2018-05-09 DIAGNOSIS — O99340 Other mental disorders complicating pregnancy, unspecified trimester: Secondary | ICD-10-CM

## 2018-05-09 DIAGNOSIS — O09899 Supervision of other high risk pregnancies, unspecified trimester: Secondary | ICD-10-CM

## 2018-05-09 DIAGNOSIS — F32A Depression, unspecified: Secondary | ICD-10-CM

## 2018-05-09 DIAGNOSIS — O0993 Supervision of high risk pregnancy, unspecified, third trimester: Secondary | ICD-10-CM

## 2018-05-09 DIAGNOSIS — F329 Major depressive disorder, single episode, unspecified: Secondary | ICD-10-CM

## 2018-05-09 DIAGNOSIS — O09219 Supervision of pregnancy with history of pre-term labor, unspecified trimester: Secondary | ICD-10-CM

## 2018-05-09 DIAGNOSIS — E118 Type 2 diabetes mellitus with unspecified complications: Secondary | ICD-10-CM

## 2018-05-09 MED ORDER — FLUOXETINE HCL 40 MG PO CAPS: 40.0000 mg | ORAL_CAPSULE | Freq: Every day | ORAL | 3 refills | Status: DC

## 2018-05-09 MED ORDER — METFORMIN HCL 1000 MG PO TABS: 1000.0000 mg | ORAL_TABLET | Freq: Every day | ORAL | 3 refills | Status: DC

## 2018-05-09 NOTE — Progress Notes (Signed)
   PRENATAL VISIT NOTE  Subjective:  Shelly Lane is a 42 y.o. [id number] at 875w4d being seen today for ongoing prenatal care.  She is currently monitored for the following issues for this high-risk pregnancy and has OBESITY; ANXIETY; BULIMIA; Depression affecting pregnancy, antepartum; SYMPTOM, PAIN, ABDOMINAL, EPIGASTRIC; Elderly multigravida with antepartum condition or complication; SAB (spontaneous abortion) x 4; Grand multipara; Hx of preterm delivery x 2, currently pregnant; Hx gestational diabetes x 3; Multiple drug allergies; History of abnormal cervical Pap smear; Supervision of high-risk pregnancy; Chronic pain disorder; Drug abuse (HCC); DM (diabetes mellitus), type 2 (HCC); and Sciatic nerve pain, right on their problem list.  Patient reports right sciatic nerve pain and persistent depression on prozac.  Contractions: Irritability. Vag. Bleeding: None.  Movement: Present. Denies leaking of fluid.   The following portions of the patient's history were reviewed and updated as appropriate: allergies, current medications, past family history, past medical history, past social history, past surgical history and problem list. Problem list updated.  Objective:   Vitals:   05/09/18 1623  BP: 104/67  Pulse: (!) 127  Weight: 276 lb (125.2 kg)    Fetal Status: Fetal Heart Rate (bpm): 160   Movement: Present     General:  Alert, oriented and cooperative. Patient is in no acute distress.  Skin: Skin is warm and dry. No rash noted.   Cardiovascular: Normal heart rate noted  Respiratory: Normal respiratory effort, no problems with respiration noted  Abdomen: Soft, gravid, appropriate for gestational age.  Pain/Pressure: Present     Pelvic: Cervical exam deferred        Extremities: Normal range of motion.  Edema: Trace  Mental Status: Normal mood and affect. Normal behavior. Normal judgment and thought content.   Assessment and Plan:  Pregnancy: [id number] at 765w4d  1. Supervision of  high risk pregnancy in third trimester Patient is doing well Third trimester labs today - CBC - HIV antibody - RPR  2. Type 2 diabetes mellitus with complication, unspecified whether long term insulin use (HCC) Patient did not bring CBG log but reports normal pp values and fasting values averaging 105 on metformin 500 mg qHS - Increase metformin dosage to 1000 mg qHS.  - follow up growth ultrasound and BPP on friday - US MFM FETAL BPP WO NON STRESS; Future  3. Depression affecting pregnancy, antepartum Prozac increased to 40 mg daily  4. Hx of preterm delivery x 2, currently pregnant Declined 17-P  5. Grand multipara   6. Sciatic nerve pain, right Advised the continued usage of maternity support belt Encouraged stretching exercises Encouraged patient to continue treatment with chiropractor as it is a pregnancy only condition in her case. Patient states that outside of pregnancy she does not suffer from sciatica  Preterm labor symptoms and general obstetric precautions including but not limited to vaginal bleeding, contractions, leaking of fluid and fetal movement were reviewed in detail with the patient. Please refer to After Visit Summary for other counseling recommendations.  Return in about 2 weeks (around 05/23/2018) for ROB, High risk.  Future Appointments  Date Time Provider Department Center  05/13/2018  3:30 PM [facility] US 1 [facility]    Catalina AntiguaPeggy Ryann Pauli, MD

## 2018-05-09 NOTE — Progress Notes (Signed)
ROB. C/o sciatica pain 8/10 and that the Metformin and Prozac does not work.

## 2018-05-10 ENCOUNTER — Encounter: Payer: Self-pay | Admitting: Obstetrics and Gynecology

## 2018-05-10 LAB — CBC
Hematocrit: 34.8 % (ref 34.0–46.6)
Hemoglobin: 11.8 g/dL (ref 11.1–15.9)
MCH: 28.9 pg (ref 26.6–33.0)
MCHC: 33.9 g/dL (ref 31.5–35.7)
MCV: 85 fL (ref 79–97)
Platelets: 261 10*3/uL (ref 150–450)
RBC: 4.08 x10E6/uL (ref 3.77–5.28)
RDW: 14.1 % (ref 12.3–15.4)
WBC: 9.9 10*3/uL (ref 3.4–10.8)

## 2018-05-10 LAB — HIV ANTIBODY (ROUTINE TESTING W REFLEX): HIV Screen 4th Generation wRfx: NONREACTIVE

## 2018-05-10 LAB — RPR: RPR Ser Ql: NONREACTIVE

## 2018-05-11 ENCOUNTER — Encounter: Payer: Self-pay | Admitting: Obstetrics and Gynecology

## 2018-05-13 ENCOUNTER — Other Ambulatory Visit: Payer: Self-pay | Admitting: Obstetrics and Gynecology

## 2018-05-13 ENCOUNTER — Other Ambulatory Visit (HOSPITAL_COMMUNITY): Payer: Self-pay | Admitting: Maternal and Fetal Medicine

## 2018-05-13 ENCOUNTER — Encounter (HOSPITAL_COMMUNITY): Payer: Self-pay

## 2018-05-13 ENCOUNTER — Ambulatory Visit (HOSPITAL_COMMUNITY)
Admission: RE | Admit: 2018-05-13 | Discharge: 2018-05-13 | Disposition: A | Discharge status: Home / Self Care | Payer: Medicaid Other | Source: Ambulatory Visit | Attending: Obstetrics | Admitting: Obstetrics

## 2018-05-13 DIAGNOSIS — O99213 Obesity complicating pregnancy, third trimester: Secondary | ICD-10-CM

## 2018-05-13 DIAGNOSIS — O09523 Supervision of elderly multigravida, third trimester: Secondary | ICD-10-CM

## 2018-05-13 DIAGNOSIS — O09213 Supervision of pregnancy with history of pre-term labor, third trimester: Secondary | ICD-10-CM

## 2018-05-13 DIAGNOSIS — E119 Type 2 diabetes mellitus without complications: Secondary | ICD-10-CM

## 2018-05-13 DIAGNOSIS — M5431 Sciatica, right side: Secondary | ICD-10-CM

## 2018-05-13 DIAGNOSIS — E118 Type 2 diabetes mellitus with unspecified complications: Secondary | ICD-10-CM

## 2018-05-13 DIAGNOSIS — Z3A28 28 weeks gestation of pregnancy: Secondary | ICD-10-CM | POA: Diagnosis not present

## 2018-05-13 DIAGNOSIS — O24113 Pre-existing diabetes mellitus, type 2, in pregnancy, third trimester: Secondary | ICD-10-CM | POA: Insufficient documentation

## 2018-05-13 DIAGNOSIS — O0942 Supervision of pregnancy with grand multiparity, second trimester: Secondary | ICD-10-CM | POA: Diagnosis not present

## 2018-05-17 ENCOUNTER — Other Ambulatory Visit (HOSPITAL_COMMUNITY): Payer: Self-pay | Admitting: *Deleted

## 2018-05-17 DIAGNOSIS — O09529 Supervision of elderly multigravida, unspecified trimester: Secondary | ICD-10-CM

## 2018-05-23 ENCOUNTER — Ambulatory Visit (INDEPENDENT_AMBULATORY_CARE_PROVIDER_SITE_OTHER): Payer: Medicaid Other | Admitting: Obstetrics and Gynecology

## 2018-05-23 ENCOUNTER — Encounter: Payer: Self-pay | Admitting: Obstetrics and Gynecology

## 2018-05-23 ENCOUNTER — Other Ambulatory Visit: Payer: Self-pay

## 2018-05-23 VITALS — BP 100/70 | HR 114 | Wt 274.0 lb

## 2018-05-23 DIAGNOSIS — E119 Type 2 diabetes mellitus without complications: Secondary | ICD-10-CM

## 2018-05-23 DIAGNOSIS — O09219 Supervision of pregnancy with history of pre-term labor, unspecified trimester: Secondary | ICD-10-CM

## 2018-05-23 DIAGNOSIS — M5431 Sciatica, right side: Secondary | ICD-10-CM

## 2018-05-23 DIAGNOSIS — O09899 Supervision of other high risk pregnancies, unspecified trimester: Secondary | ICD-10-CM

## 2018-05-23 DIAGNOSIS — O0993 Supervision of high risk pregnancy, unspecified, third trimester: Secondary | ICD-10-CM

## 2018-05-23 DIAGNOSIS — Z23 Encounter for immunization: Secondary | ICD-10-CM

## 2018-05-23 DIAGNOSIS — O09213 Supervision of pregnancy with history of pre-term labor, third trimester: Secondary | ICD-10-CM

## 2018-05-23 NOTE — Progress Notes (Signed)
ROB. TDAP given in left arm, tolerated well.

## 2018-05-23 NOTE — Progress Notes (Signed)
   PRENATAL VISIT NOTE  Subjective:  Shelly Lane is a 42 y.o. [id number] at 6646w4d being seen today for ongoing prenatal care.  She is currently monitored for the following issues for this high-risk pregnancy and has OBESITY; ANXIETY; BULIMIA; Depression affecting pregnancy, antepartum; SYMPTOM, PAIN, ABDOMINAL, EPIGASTRIC; Elderly multigravida with antepartum condition or complication; SAB (spontaneous abortion) x 4; Grand multipara; Hx of preterm delivery x 2, currently pregnant; Hx gestational diabetes x 3; Multiple drug allergies; History of abnormal cervical Pap smear; Supervision of high-risk pregnancy; Chronic pain disorder; Drug abuse (HCC); DM (diabetes mellitus), type 2 (HCC); and Sciatic nerve pain, right on their problem list.  Patient reports no complaints.  Contractions: Not present. Vag. Bleeding: None.  Movement: Present. Denies leaking of fluid.   The following portions of the patient's history were reviewed and updated as appropriate: allergies, current medications, past family history, past medical history, past social history, past surgical history and problem list. Problem list updated.  Objective:   Vitals:   05/23/18 1605 05/23/18 1614  BP: 100/70 100/70  Pulse: (!) 114 (!) 114  Weight: 274 lb (124.3 kg) 274 lb (124.3 kg)    Fetal Status: Fetal Heart Rate (bpm): 157 Fundal Height: 30 cm Movement: Present     General:  Alert, oriented and cooperative. Patient is in no acute distress.  Skin: Skin is warm and dry. No rash noted.   Cardiovascular: Normal heart rate noted  Respiratory: Normal respiratory effort, no problems with respiration noted  Abdomen: Soft, gravid, appropriate for gestational age.  Pain/Pressure: Present     Pelvic: Cervical exam deferred        Extremities: Normal range of motion.  Edema: Trace  Mental Status: Normal mood and affect. Normal behavior. Normal judgment and thought content.   Assessment and Plan:  Pregnancy: [id number] at  3946w4d  1. Supervision of high risk pregnancy in third trimester Patient is doing well without complaints  2. Type 2 diabetes mellitus without complication, without long-term current use of insulin (HCC) CBGs reviewed and all within range Continue metformin qHS Will continue weekly BPP and start weekly NST at 32 weeks  3. Hx of preterm delivery x 2, currently pregnant Declined 17-P  4. Sciatic nerve pain, right Continue stretching Follow up with chiropractor  Preterm labor symptoms and general obstetric precautions including but not limited to vaginal bleeding, contractions, leaking of fluid and fetal movement were reviewed in detail with the patient. Please refer to After Visit Summary for other counseling recommendations.  Return in about 2 weeks (around 06/06/2018) for ROB.  Future Appointments  Date Time Provider Department Center  06/10/2018  2:45 PM [facility] US 2 [facility]    Catalina AntiguaPeggy Corday Wyka, MD

## 2018-05-29 ENCOUNTER — Inpatient Hospital Stay (HOSPITAL_COMMUNITY)
Admission: AD | Admit: 2018-05-29 | Discharge: 2018-05-29 | Disposition: A | Discharge status: Home / Self Care | Payer: Medicaid Other | Source: Ambulatory Visit | Attending: Obstetrics & Gynecology | Admitting: Obstetrics & Gynecology

## 2018-05-29 ENCOUNTER — Encounter (HOSPITAL_COMMUNITY): Payer: Self-pay | Admitting: *Deleted

## 2018-05-29 DIAGNOSIS — Z3A30 30 weeks gestation of pregnancy: Secondary | ICD-10-CM | POA: Diagnosis not present

## 2018-05-29 DIAGNOSIS — Z8249 Family history of ischemic heart disease and other diseases of the circulatory system: Secondary | ICD-10-CM | POA: Insufficient documentation

## 2018-05-29 DIAGNOSIS — F419 Anxiety disorder, unspecified: Secondary | ICD-10-CM | POA: Insufficient documentation

## 2018-05-29 DIAGNOSIS — Z7982 Long term (current) use of aspirin: Secondary | ICD-10-CM | POA: Insufficient documentation

## 2018-05-29 DIAGNOSIS — Z79899 Other long term (current) drug therapy: Secondary | ICD-10-CM | POA: Insufficient documentation

## 2018-05-29 DIAGNOSIS — Z87440 Personal history of urinary (tract) infections: Secondary | ICD-10-CM | POA: Diagnosis not present

## 2018-05-29 DIAGNOSIS — R42 Dizziness and giddiness: Secondary | ICD-10-CM | POA: Insufficient documentation

## 2018-05-29 DIAGNOSIS — R55 Syncope and collapse: Secondary | ICD-10-CM

## 2018-05-29 DIAGNOSIS — O26893 Other specified pregnancy related conditions, third trimester: Secondary | ICD-10-CM | POA: Diagnosis not present

## 2018-05-29 DIAGNOSIS — O24913 Unspecified diabetes mellitus in pregnancy, third trimester: Secondary | ICD-10-CM | POA: Insufficient documentation

## 2018-05-29 DIAGNOSIS — F329 Major depressive disorder, single episode, unspecified: Secondary | ICD-10-CM | POA: Diagnosis not present

## 2018-05-29 DIAGNOSIS — O99613 Diseases of the digestive system complicating pregnancy, third trimester: Secondary | ICD-10-CM | POA: Diagnosis not present

## 2018-05-29 DIAGNOSIS — O99353 Diseases of the nervous system complicating pregnancy, third trimester: Secondary | ICD-10-CM | POA: Insufficient documentation

## 2018-05-29 DIAGNOSIS — Z885 Allergy status to narcotic agent status: Secondary | ICD-10-CM | POA: Insufficient documentation

## 2018-05-29 DIAGNOSIS — O99213 Obesity complicating pregnancy, third trimester: Secondary | ICD-10-CM | POA: Insufficient documentation

## 2018-05-29 DIAGNOSIS — G8929 Other chronic pain: Secondary | ICD-10-CM | POA: Diagnosis not present

## 2018-05-29 DIAGNOSIS — O99343 Other mental disorders complicating pregnancy, third trimester: Secondary | ICD-10-CM | POA: Insufficient documentation

## 2018-05-29 DIAGNOSIS — F410 Panic disorder [episodic paroxysmal anxiety] without agoraphobia: Secondary | ICD-10-CM | POA: Diagnosis not present

## 2018-05-29 DIAGNOSIS — K219 Gastro-esophageal reflux disease without esophagitis: Secondary | ICD-10-CM | POA: Insufficient documentation

## 2018-05-29 DIAGNOSIS — E669 Obesity, unspecified: Secondary | ICD-10-CM | POA: Insufficient documentation

## 2018-05-29 DIAGNOSIS — Z7984 Long term (current) use of oral hypoglycemic drugs: Secondary | ICD-10-CM | POA: Diagnosis not present

## 2018-05-29 LAB — URINALYSIS, ROUTINE W REFLEX MICROSCOPIC
Bilirubin Urine: NEGATIVE
Glucose, UA: >=500 mg/dL — AB
Hgb urine dipstick: NEGATIVE
Ketones, ur: NEGATIVE mg/dL
Leukocytes, UA: NEGATIVE
Nitrite: NEGATIVE
Protein, ur: NEGATIVE mg/dL
Specific Gravity, Urine: 1.008 (ref 1.005–1.030)
pH: 6 (ref 5.0–8.0)

## 2018-05-29 LAB — BASIC METABOLIC PANEL
Anion gap: 10 (ref 5–15)
BUN: 7 mg/dL (ref 6–20)
CO2: 19 mmol/L — ABNORMAL LOW (ref 22–32)
Calcium: 8.7 mg/dL — ABNORMAL LOW (ref 8.9–10.3)
Chloride: 103 mmol/L (ref 101–111)
Creatinine, Ser: 0.4 mg/dL — ABNORMAL LOW (ref 0.44–1.00)
GFR calc Af Amer: >60 mL/min (ref >60)
GFR calc non Af Amer: >60 mL/min (ref >60)
Glucose, Bld: 134 mg/dL — ABNORMAL HIGH (ref 65–99)
Potassium: 3.5 mmol/L (ref 3.5–5.1)
Sodium: 132 mmol/L — ABNORMAL LOW (ref 135–145)

## 2018-05-29 LAB — RAPID URINE DRUG SCREEN, HOSP PERFORMED
Amphetamines: NOT DETECTED
Benzodiazepines: NOT DETECTED
Cocaine: NOT DETECTED
Opiates: NOT DETECTED
Tetrahydrocannabinol: NOT DETECTED

## 2018-05-29 LAB — CBC
HCT: 35 % — ABNORMAL LOW (ref 36.0–46.0)
Hemoglobin: 11.7 g/dL — ABNORMAL LOW (ref 12.0–15.0)
MCH: 29.3 pg (ref 26.0–34.0)
MCHC: 33.4 g/dL (ref 30.0–36.0)
MCV: 87.5 fL (ref 78.0–100.0)
Platelets: 257 10*3/uL (ref 150–400)
RBC: 4 MIL/uL (ref 3.87–5.11)
RDW: 14.2 % (ref 11.5–15.5)
WBC: 10.3 10*3/uL (ref 4.0–10.5)

## 2018-05-29 LAB — GLUCOSE, CAPILLARY: Glucose-Capillary: 136 mg/dL — ABNORMAL HIGH (ref 65–99)

## 2018-05-29 MED ORDER — LACTATED RINGERS IV BOLUS
1000.0000 mL | Freq: Once | INTRAVENOUS | Status: AC
  Administered 2018-05-29: 1000 mL via INTRAVENOUS

## 2018-05-29 NOTE — MAU Note (Addendum)
Since Sat AM awoke at 1100 and felt dizzy. Happened 5 times yesterday and I have fallen 4 different times. Feels like heart is pounding at times. Does have anxiety. Friday night had panic attack. Having a lot of pain and pressure in pelvis, lower back, and legs. Denies LOF or bleeding and good FM

## 2018-05-29 NOTE — MAU Note (Signed)
Pt reports dizziness and "passing out" 5 times yesterday. Last CBG per pt report 124 @ 2300. Pt reports  taht she feels dizziness when standing up along with pelvic pressure. +FM. Deneis LOF or bleeding

## 2018-05-29 NOTE — MAU Provider Note (Signed)
History  CSN: [id number] Arrival date and time: 05/29/18 0219  First Provider Initiated Contact with Patient 05/29/18 [phone]      Chief Complaint  Patient presents with  . Dizziness    HPI: Shelly Lane is a 42 y.o. [id number] with IUP at 41w3dwho presents to maternity admissions reporting several syncopal episodes yesterday and persistent dizziness. She reports feeling dizzy since she woke up yesterday around 11 am, and had a near-syncope episode shortly after that. A couple of hours after that, she was in the shower, and was bending over when she had LOC; reports she slid, and when she woke up she was laying on her side. Reports only feeling dizzy then, and denies SOB or chest pain. Episode was not witnessed. Reports that later when she was at a store with her son she had two other near syncope episode, and was about to pass out, when her son was able to hold her and she was able to sit down before she passed out. Reports having another syncope event at home later in the evening. She denies having any abdominal trauma with any of these events. She reports that events that her witnessed by her children, she was told they noted her eyes rolling. Denies any seizure-like activity, or loss of bladder or bowel function. She reports she came in tonight d/t persistent dizziness.   She has pregnancy c/b anxiety, chronic pain, and diabetes. She reports her blood sugars have been normal to slightly elevated (90s-130s). Reports taking metformin in the evening, but did not take last night. She is on several other medications, including fluoxetine, gabapentin, percocet, vistaril, flexeril, and Amben. She took all of theses medications yesterday, although she initially reported to RN taking Ambien tonight, but later denies.  She reports having a panic attack a couple of days ago. DLangley Gaussany new stressors at home.   Denies contractions, leakage of fluid or vaginal bleeding. Reports normal fetal movement, but  reports she is typically to busy to note frequency of fetal movement.    OB History  Gravida Para Term Preterm AB Living  _0 SAB TAB Ectopic Multiple Live Births  3     0 9    # Outcome Date GA Lbr Len/2nd Weight Sex Delivery Anes PTL Lv  13 Current           12 Term 03/27/17 331w0d1:21 / 00:02 8 lb 9.2 oz (3.89 kg) F Vag-Spont None  LIV  11 Term 11/15/14 3829w1d:17 / 00:07 8 lb 6.2 oz (3.805 kg) F Vag-Spont Local  LIV  10 Term 10/05/13 38w36w5d10 / 00:02 6 lb 7.5 oz (2.934 kg) F Vag-Spont None  LIV  9 SAB 07/09/12 11w074w0d    8 SAB 02/07/12 [redacted]w[redacted]d [redacted]w[redacted]d      Birth Comments: System Generated. Please review and update pregnancy details.  7 Term 01/23/10 [redacted]w[redacted]d 102w6d6 lb 13.7 oz (3.11 kg) F Vag-Spont EPI  LIV     Birth Comments: System Generated. Please review and update pregnancy details.  6 Preterm 02/08/09 [redacted]w[redacted]d 128w2d lb 9.5 oz (2.99 kg) F Vag-Spont   LIV  5 SAB 2008 [redacted]w[redacted]d    61w0d4 Preterm 02/21/04 [redacted]w[redacted]d 05:3w2db 5 oz (2.863 kg) F Vag-Spont EPI  LIV  3 Term 08/2002 [redacted]w[redacted]d 21:083w0d 15 oz (4.054 kg) M Vag-Spont EPI  LIV  2  Term 04/1999 4w0d05:00 7 lb 9 oz (3.43 kg) M Vag-Spont EPI  LIV  1 Term 11/1997 376w0d2:30 7 lb 6 oz (3.345 kg) F Vag-Spont EPI  LIV   Past Medical History:  Diagnosis Date  . Abnormal Pap smear    Colpo;Last pap 2012, ASCUS w/ negative HPV  . Anemia    during preg  . Anxiety   . ASCUS with positive high risk HPV   . Depression    ok now  . Eczema   . GERD (gastroesophageal reflux disease)   . Gestational diabetes    diet controlled  . H/O candidiasis   . H/O migraine   . H/O rubella   . H/O varicella   . H/O: depression   . H/O: obesity   . Headache(784.0)   . Hx: UTI (urinary tract infection)   . Miscarriage    3/13 and 7/13   . Preterm labor   . Vaginal Pap smear, abnormal    Past Surgical History:  Procedure Laterality Date  . CHOLECYSTECTOMY  2008  . HERNIA REPAIR  2012  . HIATAL HERNIA REPAIR  2012  . WISDOM TOOTH  EXTRACTION     Family History  Problem Relation Age of Onset  . Diabetes Maternal Grandmother   . Hypertension Maternal Grandmother   . Anemia Mother    Social History   Socioeconomic History  . Marital status: Married    Spouse name: Aliou Bia  . Number of children: 6  . Years of education: 128. Highest education level: Not on file  Occupational History  . Occupation: [employer]  . Financial resource strain: Not on file  . Food insecurity:    Worry: Not on file    Inability: Not on file  . Transportation needs:    Medical: Not on file    Non-medical: Not on file  Tobacco Use  . Smoking status: Never Smoker  . Smokeless tobacco: Never Used  Substance and Sexual Activity  . Alcohol use: No  . Drug use: No  . Sexual activity: Yes    Partners: Male    Birth control/protection: None    Comment: pregnant  Lifestyle  . Physical activity:    Days per week: Not on file    Minutes per session: Not on file  . Stress: Not on file  Relationships  . Social connections:    Talks on phone: Not on file    Gets together: Not on file    Attends religious service: Not on file    Active member of club or organization: Not on file    Attends meetings of clubs or organizations: Not on file    Relationship status: Not on file  . Intimate partner violence:    Fear of current or ex partner: Not on file    Emotionally abused: Not on file    Physically abused: Not on file    Forced sexual activity: Not on file  Other Topics Concern  . Not on file  Social History Narrative  . Not on file   Allergies  Allergen Reactions  . Vicodin [Hydrocodone-Acetaminophen] Itching    Medications Prior to Admission  Medication Sig Dispense Refill Last Dose  . aspirin EC 81 MG tablet Take 1 tablet (81 mg total) by mouth daily. Take after 12 weeks for prevention of preeclampsia later in pregnancy 300 tablet 2 05/28/2018 at Unknown time  . cyclobenzaprine (FLEXERIL) 10 MG tablet Take 10 mg  by  mouth 3 (three) times daily as needed for muscle spasms.   05/28/2018 at Unknown time  . FLUoxetine (PROZAC) 40 MG capsule Take 1 capsule (40 mg total) by mouth daily. 30 capsule 3 05/28/2018 at Unknown time  . fluticasone (FLONASE) 50 MCG/ACT nasal spray Place 2 sprays into both nostrils daily as needed for rhinitis.    Past Month at Unknown time  . gabapentin (NEURONTIN) 300 MG capsule TAKE ONE CAPSULE BY MOUTH THREE TIMES DAILY 90 capsule 2 05/28/2018 at Unknown time  . hydrOXYzine (VISTARIL) 50 MG capsule Take 1 capsule (50 mg total) by mouth every 6 (six) hours as needed. 45 capsule 2 05/28/2018 at Unknown time  . loratadine (CLARITIN) 10 MG tablet Take 10 mg by mouth daily as needed for allergies.    Past Month at Unknown time  . metFORMIN (GLUCOPHAGE) 1000 MG tablet Take 1 tablet (1,000 mg total) by mouth at bedtime. 30 tablet 3 05/28/2018 at Unknown time  . oxyCODONE-acetaminophen (PERCOCET/ROXICET) 5-325 MG tablet Take 5 tablets by mouth 3 (three) times daily.  0 05/28/2018 at Unknown time  . Prenatal-Fe Fum-Methf-FA w/o A (VITAFOL-NANO) 18-0.6-0.4 MG TABS Take 1 tablet by mouth daily. 30 tablet 12 05/28/2018 at Unknown time  . zolpidem (AMBIEN) 5 MG tablet Take 1 tablet (5 mg total) by mouth at bedtime as needed for sleep. 30 tablet 1 05/28/2018 at Unknown time  . ACCU-CHEK FASTCLIX LANCETS MISC 1 Units by Percutaneous route 4 (four) times daily. 100 each 12 Taking  . Blood Glucose Monitoring Suppl (ACCU-CHEK NANO SMARTVIEW) w/Device KIT 1 kit by Subdermal route as directed. Check blood sugars for fasting, and two hours after breakfast, lunch and dinner (4 checks daily) 1 kit 0 Taking  . clobetasol ointment (TEMOVATE) 0.05 % APPLY TOPICALLY TO THE AFFECTED AREA TWICE DAILY 30 g 0 Taking  . glucose blood (ACCU-CHEK SMARTVIEW) test strip Use as instructed to check blood sugars 100 each 12 Taking  . metFORMIN (GLUCOPHAGE) 500 MG tablet Take 1 pill at bedtime 30 tablet 6   . zolpidem (AMBIEN) 10 MG  tablet Take 1 tablet (10 mg total) by mouth at bedtime as needed for sleep. 30 tablet 3 Past Week at Unknown time    I have reviewed patient's Past Medical Hx, Surgical Hx, Family Hx, Social Hx, medications and allergies.   Review of Systems: Negative except for what is mentioned in HPI.  Physical Exam   Patient Vitals for the past 4 hrs:  BP Pulse Resp SpO2 Height Weight  05/29/18 0315 - - - 98 % - -  05/29/18 0309 - (!) 112 - 96 % - -  05/29/18 0307 97/71 (!) 177 - - - -  05/29/18 0306 (!) 97/48 (!) 115 - - - -  05/29/18 0303 112/61 (!) 111 - - - -  05/29/18 0232 108/60 (!) 121 18 98 % - -  05/29/18 0230 - - - - 5' 7" (1.702 m) 270 lb (122.5 kg)   Constitutional: Well-developed, well-nourished female in no acute distress, but appears somewhat somnolent  HENT: [place]/AT, normal oropharynx mucosa. MMM Eyes: normal conjunctivae, no scleral icterus Cardiovascular: mildly tachycardic, regular rhythm, no murmurs, rubs or fallops Respiratory: normal effort, lungs CTAB w/o wheezes, rales or ronchi GI: Abd soft, non-tender, gravid appropriate for gestational age.   Pelvic: deffered MSK: Extremities nontender, no edema Neurologic: Alert and oriented x 4. Psych: Normal mood and affect Skin: warm and dry   FHT:  Baseline 150, initially minimal variability, 1 acel, no  decel; after IV bolus -> baseline 150 moderate variability, accelerations present, no decelerations Toco: no ctx  MAU Course/MDM:   Nursing notes and VS reviewed. Patient seen and examined, as noted above.  Positive orthostatic VS.   IVFs, labs, EKG ordered FHT initially with minimal variability. Improved with IV fluids and reactive NST.  Results reviewed:  Results for orders placed or performed during the hospital encounter of 05/29/18  Urinalysis, Routine w reflex microscopic  Result Value Ref Range   Color, Urine STRAW (A) YELLOW   APPearance CLEAR CLEAR   Specific Gravity, Urine 1.008 1.005 - 1.030   pH 6.0 5.0 -  8.0   Glucose, UA >=500 (A) NEGATIVE mg/dL   Hgb urine dipstick NEGATIVE NEGATIVE   Bilirubin Urine NEGATIVE NEGATIVE   Ketones, ur NEGATIVE NEGATIVE mg/dL   Protein, ur NEGATIVE NEGATIVE mg/dL   Nitrite NEGATIVE NEGATIVE   Leukocytes, UA NEGATIVE NEGATIVE   RBC / HPF 0-5 0 - 5 RBC/hpf   WBC, UA 0-5 0 - 5 WBC/hpf   Bacteria, UA RARE (A) NONE SEEN   Squamous Epithelial / LPF 6-10 0 - 5   Mucus PRESENT   Glucose, capillary  Result Value Ref Range   Glucose-Capillary 136 (H) 65 - 99 mg/dL   Comment 1 Call MD NNP PA CNM   CBC  Result Value Ref Range   WBC 10.3 4.0 - 10.5 K/uL   RBC 4.00 3.87 - 5.11 MIL/uL   Hemoglobin 11.7 (L) 12.0 - 15.0 g/dL   HCT 35.0 (L) 36.0 - 46.0 %   MCV 87.5 78.0 - 100.0 fL   MCH 29.3 26.0 - 34.0 pg   MCHC 33.4 30.0 - 36.0 g/dL   RDW 14.2 11.5 - 15.5 %   Platelets 257 150 - 400 K/uL  Basic metabolic panel  Result Value Ref Range   Sodium 132 (L) 135 - 145 mmol/L   Potassium 3.5 3.5 - 5.1 mmol/L   Chloride 103 101 - 111 mmol/L   CO2 19 (L) 22 - 32 mmol/L   Glucose, Bld 134 (H) 65 - 99 mg/dL   BUN 7 6 - 20 mg/dL   Creatinine, Ser 0.40 (L) 0.44 - 1.00 mg/dL   Calcium 8.7 (L) 8.9 - 10.3 mg/dL   GFR calc non Af Amer >60 >60 mL/min   GFR calc Af Amer >60 >60 mL/min   Anion gap 10 5 - 15  Urine rapid drug screen (hosp performed)  Result Value Ref Range   Opiates NONE DETECTED NONE DETECTED   Cocaine NONE DETECTED NONE DETECTED   Benzodiazepines NONE DETECTED NONE DETECTED   Amphetamines NONE DETECTED NONE DETECTED   Tetrahydrocannabinol NONE DETECTED NONE DETECTED   Barbiturates (A) NONE DETECTED    Result not available. Reagent lot number recalled by manufacturer.   EKG wnl with sinus rhythm and no arrhythmias, ectopic beats for ischemic changes. CBC and electrolytes wnl. Orthostatic VS repeated after IVFs negative.  Pt ambulated after IVFs and was feeling better and denied any dizziness.   MDM: 42 y.o. [id number] with IUP at 70w3dhere s/p several  pre-syncope and at least one syncope episode today. Her reported symptoms and positive orthostatic VS here makes dx mst likely orthostatic syncope (volume depletion vs autonomic failure d/t meds). I am most concerned that this is d/t meds as she is on several sedative medications including gabapentin, Percocet, Flexeril, Vistaril, and Ambien. She did respond well to IVFs here and dizziness improved. Ddx is much less likely to be  of cardiac (normal EKG, no chest pain or SOB), or PE (no CP, SOB, and normal O2 sats here), and no signs of seizure-like activity form described symptoms. Her CBG here is 136 and she is not having any hypoglycemia at home; and she is only on metformin for DM.    Assessment and Plan  Assessment: 1. Orthostatic dizziness   2. Syncope, unspecified syncope type   3. [redacted] weeks gestation of pregnancy   4. High risk medication use     Plan: --Discussed patient concern of combination of medications she is taking, although patient hesitant to this since she has been on these medications for a while now.  --Discussed continued po hydration at home. --Discharge home in stable condition with strict return precautions.    Degele, Jenne Pane, MD 05/29/2018 4:02 AM

## 2018-05-29 NOTE — Discharge Instructions (Signed)
Syncope Syncope is when you lose temporarily pass out (faint). Signs that you may be about to pass out include:  Feeling dizzy or light-headed.  Feeling sick to your stomach (nauseous).  Seeing all white or all black.  Having cold, clammy skin.  If you passed out, get help right away. Call your local emergency services (911 in the [place] Do not drive yourself to the hospital. Follow these instructions at home: Pay attention to any changes in your symptoms. Take these actions to help with your condition:  Have someone stay with you until you feel stable.  Do not drive, use machinery, or play sports until your doctor says it is okay.  Keep all follow-up visits as told by your doctor. This is important.  If you start to feel like you might pass out, lie down right away and raise (elevate) your feet above the level of your heart. Breathe deeply and steadily. Wait until all of the symptoms are gone.  Drink enough fluid to keep your pee (urine) clear or pale yellow.  If you are taking blood pressure or heart medicine, get up slowly and spend many minutes getting ready to sit and then stand. This can help with dizziness.  Take over-the-counter and prescription medicines only as told by your doctor.  Get help right away if:  You have a very bad headache.  You have unusual pain in your chest, tummy, or back.  You are bleeding from your mouth or rectum.  You have black or tarry poop (stool).  You have a very fast or uneven heartbeat (palpitations).  It hurts to breathe.  You pass out once or more than once.  You have jerky movements that you cannot control (seizure).  You are confused.  You have trouble walking.  You are very weak.  You have vision problems. These symptoms may be an emergency. Do not wait to see if the symptoms will go away. Get medical help right away. Call your local emergency services (911 in the [place] Do not drive yourself to the hospital. This  information is not intended to replace advice given to you by your health care provider. Make sure you discuss any questions you have with your health care provider. Document Released: 05/11/2008 Document Revised: 04/30/2016 Document Reviewed: 08/07/2015 Elsevier Interactive Patient Education  2018 [employer]  

## 2018-05-31 ENCOUNTER — Emergency Department (HOSPITAL_COMMUNITY)
Admission: EM | Admit: 2018-05-31 | Discharge: 2018-05-31 | Disposition: A | Discharge status: Home / Self Care | Payer: Medicaid Other | Attending: Emergency Medicine | Admitting: Emergency Medicine

## 2018-05-31 ENCOUNTER — Emergency Department (HOSPITAL_COMMUNITY): Payer: Medicaid Other

## 2018-05-31 ENCOUNTER — Encounter (HOSPITAL_COMMUNITY): Payer: Self-pay | Admitting: Emergency Medicine

## 2018-05-31 DIAGNOSIS — Z3A00 Weeks of gestation of pregnancy not specified: Secondary | ICD-10-CM | POA: Diagnosis not present

## 2018-05-31 DIAGNOSIS — Z79899 Other long term (current) drug therapy: Secondary | ICD-10-CM | POA: Diagnosis not present

## 2018-05-31 DIAGNOSIS — R0789 Other chest pain: Secondary | ICD-10-CM | POA: Diagnosis not present

## 2018-05-31 DIAGNOSIS — E1165 Type 2 diabetes mellitus with hyperglycemia: Secondary | ICD-10-CM | POA: Diagnosis not present

## 2018-05-31 DIAGNOSIS — Z7984 Long term (current) use of oral hypoglycemic drugs: Secondary | ICD-10-CM | POA: Diagnosis not present

## 2018-05-31 DIAGNOSIS — O9989 Other specified diseases and conditions complicating pregnancy, childbirth and the puerperium: Secondary | ICD-10-CM | POA: Insufficient documentation

## 2018-05-31 DIAGNOSIS — O99810 Abnormal glucose complicating pregnancy: Secondary | ICD-10-CM

## 2018-05-31 LAB — BASIC METABOLIC PANEL
Anion gap: 7 (ref 5–15)
BUN: <5 mg/dL — ABNORMAL LOW (ref 6–20)
CO2: 21 mmol/L — ABNORMAL LOW (ref 22–32)
Calcium: 8.7 mg/dL — ABNORMAL LOW (ref 8.9–10.3)
Chloride: 106 mmol/L (ref 98–111)
Creatinine, Ser: 0.64 mg/dL (ref 0.44–1.00)
GFR calc Af Amer: >60 mL/min (ref >60)
GFR calc non Af Amer: >60 mL/min (ref >60)
Glucose, Bld: 231 mg/dL — ABNORMAL HIGH (ref 70–99)
Potassium: 3.5 mmol/L (ref 3.5–5.1)
Sodium: 134 mmol/L — ABNORMAL LOW (ref 135–145)

## 2018-05-31 LAB — HEPATIC FUNCTION PANEL
ALT: 37 U/L (ref 0–44)
AST: 67 U/L — ABNORMAL HIGH (ref 15–41)
Albumin: 2.6 g/dL — ABNORMAL LOW (ref 3.5–5.0)
Alkaline Phosphatase: 68 U/L (ref 38–126)
Bilirubin, Direct: 0.1 mg/dL (ref 0.0–0.2)
Indirect Bilirubin: 0.3 mg/dL (ref 0.3–0.9)
Total Bilirubin: 0.4 mg/dL (ref 0.3–1.2)
Total Protein: 5.4 g/dL — ABNORMAL LOW (ref 6.5–8.1)

## 2018-05-31 LAB — CBG MONITORING, ED: Glucose-Capillary: 137 mg/dL — ABNORMAL HIGH (ref 70–99)

## 2018-05-31 LAB — CBC
HCT: 35.5 % — ABNORMAL LOW (ref 36.0–46.0)
Hemoglobin: 11.2 g/dL — ABNORMAL LOW (ref 12.0–15.0)
MCH: 29.1 pg (ref 26.0–34.0)
MCHC: 31.5 g/dL (ref 30.0–36.0)
MCV: 92.2 fL (ref 78.0–100.0)
Platelets: 229 10*3/uL (ref 150–400)
RBC: 3.85 MIL/uL — ABNORMAL LOW (ref 3.87–5.11)
RDW: 14.3 % (ref 11.5–15.5)
WBC: 13.7 10*3/uL — ABNORMAL HIGH (ref 4.0–10.5)

## 2018-05-31 LAB — LIPASE, BLOOD: Lipase: 24 U/L (ref 11–51)

## 2018-05-31 LAB — I-STAT TROPONIN, ED: Troponin i, poc: 0 ng/mL (ref 0.00–0.08)

## 2018-05-31 MED ORDER — LACTATED RINGERS IV BOLUS
500.0000 mL | Freq: Once | INTRAVENOUS | Status: AC
  Administered 2018-05-31: 500 mL via INTRAVENOUS

## 2018-05-31 MED ORDER — FAMOTIDINE 20 MG PO TABS
20.0000 mg | ORAL_TABLET | Freq: Once | ORAL | Status: AC
  Administered 2018-05-31: 20 mg via ORAL
  Filled 2018-05-31: qty 1

## 2018-05-31 MED ORDER — ALUM & MAG HYDROXIDE-SIMETH 200-200-20 MG/5ML PO SUSP
30.0000 mL | Freq: Once | ORAL | Status: AC
  Administered 2018-05-31: 30 mL via ORAL
  Filled 2018-05-31: qty 30

## 2018-05-31 MED ORDER — SODIUM CHLORIDE 0.9 % IV BOLUS
1000.0000 mL | Freq: Once | INTRAVENOUS | Status: AC
  Administered 2018-05-31: 1000 mL via INTRAVENOUS

## 2018-05-31 MED ORDER — OMEPRAZOLE 20 MG PO CPDR: 20.0000 mg | DELAYED_RELEASE_CAPSULE | Freq: Every day | ORAL | 0 refills | Status: DC

## 2018-05-31 NOTE — Discharge Instructions (Addendum)
You were seen today for chest pain and burning.  This is likely related to reflux.  This can worsen during pregnancy.  Take medications as prescribed.  Avoid any spicy foods.  I discussed with you the possibility of PE.  You did improve drastically with acid reducing medications.  Follow-up closely with your OB/GYN.  If you develop recurrent pain, leg swelling, leg pain, shortness of breath, you should be reevaluated immediately.  Additionally, your blood sugar was noted to be 213.  Follow-up with your OB regarding adjustments in your gestational diabetes medications.

## 2018-05-31 NOTE — Progress Notes (Signed)
RROB  bp105/61 Hr 110 O2 SAT 99% patient GDM blood sugar 137. Patient complains of burning sensation that radiates to her neck and right arm and pain when she breathes in rated pain at 10 upon arrival. Patient Chest xray clear. Patient received mylanta and pepcid and reports feeling better rates pain at 5. Fetal HR 155 reactive and no decelerations. Bolus of LR given. OB cleared per Dr Vergie LivingPickens

## 2018-05-31 NOTE — ED Notes (Signed)
Unable to obtain FHT with hand held doppler, TOCO placed at bedside, external doppler placed on right lower abdomen, FHT when pt @ 45 degrees 180, pt now sitting up with FHT' in 160's. Rapid OB to assess pt.

## 2018-05-31 NOTE — ED Notes (Signed)
To XRAY at this time 

## 2018-05-31 NOTE — ED Provider Notes (Signed)
[facility] EMERGENCY DEPARTMENT Provider Note   CSN: [id number] Arrival date & time: 05/31/18  3762     History   Chief Complaint Chief Complaint  Patient presents with  . Chest Pain    HPI Shelly Lane is a 42 y.o. female.  HPI  This is a 42 year old G 53 P9 currently pregnant with an estimated due date of August 29 who presents with chest pain.  Patient reports onset of chest pain 1 hour prior to arrival.  She describes it as burning.  It starts in the lower chest epigastric and radiates to the right side and to the right shoulder.  It is not associated with eating.  SHe does report some nausea and belching.  She reports her pain is 7 out of 10.  She has not taken anything for the pain.  She denies any shortness of breath or fevers.  Denies cough.  Does report some pain with inspiration.  She has never had pain like this before.  Denies any lower extremity swelling or history of blood clots.  Patient denies any uterine contractions, vaginal bleeding.  Past Medical History:  Diagnosis Date  . Abnormal Pap smear    [place];Last pap 2012, ASCUS w/ negative HPV  . Anemia    during preg  . Anxiety   . ASCUS with positive high risk HPV   . Depression    ok now  . Eczema   . GERD (gastroesophageal reflux disease)   . Gestational diabetes    diet controlled  . H/O candidiasis   . H/O migraine   . H/O rubella   . H/O varicella   . H/O: depression   . H/O: obesity   . Headache(784.0)   . Hx: UTI (urinary tract infection)   . Miscarriage    3/13 and 7/13   . Preterm labor   . Vaginal Pap smear, abnormal     Patient Active Problem List   Diagnosis Date Noted  . Sciatic nerve pain, right 05/09/2018  . DM (diabetes mellitus), type 2 ([place]) 02/10/2018  . Drug abuse ([place]) 11/26/2016  . Chronic pain disorder 09/16/2016  . Supervision of high-risk pregnancy 09/08/2016  . History of abnormal cervical Pap smear 10/04/2013  . Elderly multigravida with  antepartum condition or complication 83/15/1761  . SAB (spontaneous abortion) x 4 02/13/2013  . [place] multipara 02/13/2013  . Hx of preterm delivery x 2, currently pregnant 02/13/2013  . Hx gestational diabetes x 3 02/13/2013  . Multiple drug allergies 02/13/2013  . BULIMIA 10/21/2007  . ANXIETY 08/02/2007  . SYMPTOM, PAIN, ABDOMINAL, EPIGASTRIC 08/02/2007  . OBESITY 08/01/2007  . Depression affecting pregnancy, antepartum 08/01/2007    Past Surgical History:  Procedure Laterality Date  . CHOLECYSTECTOMY  2008  . HERNIA REPAIR  2012  . HIATAL HERNIA REPAIR  2012  . WISDOM TOOTH EXTRACTION       OB History    Gravida  13   Para  9   Term  7   Preterm  2   AB  3   Living  9     SAB  3   TAB      Ectopic      Multiple  0   Live Births  9            Home Medications    Prior to Admission medications   Medication Sig Start Date End Date Taking? Authorizing Provider  ACCU-CHEK FASTCLIX LANCETS MISC 1 Units by  Percutaneous route 4 (four) times daily. 03/10/18   Constant, Peggy, MD  aspirin EC 81 MG tablet Take 1 tablet (81 mg total) by mouth daily. Take after 12 weeks for prevention of preeclampsia later in pregnancy 01/13/18   Anyanwu, Sallyanne Havers, MD  Blood Glucose Monitoring Suppl (ACCU-CHEK NANO SMARTVIEW) w/Device KIT 1 kit by Subdermal route as directed. Check blood sugars for fasting, and two hours after breakfast, lunch and dinner (4 checks daily) 03/10/18   Constant, Peggy, MD  clobetasol ointment (TEMOVATE) 0.05 % APPLY TOPICALLY TO THE AFFECTED AREA TWICE DAILY 06/22/17   Anyanwu, Sallyanne Havers, MD  cyclobenzaprine (FLEXERIL) 10 MG tablet Take 10 mg by mouth 3 (three) times daily as needed for muscle spasms.    [provider]  FLUoxetine (PROZAC) 40 MG capsule Take 1 capsule (40 mg total) by mouth daily. 05/09/18   Constant, Peggy, MD  fluticasone (FLONASE) 50 MCG/ACT nasal spray Place 2 sprays into both nostrils daily as needed for rhinitis.     [provider]  gabapentin (NEURONTIN) 300 MG capsule TAKE ONE CAPSULE BY MOUTH THREE TIMES DAILY 04/13/18   Constant, Peggy, MD  glucose blood (ACCU-CHEK SMARTVIEW) test strip Use as instructed to check blood sugars 03/10/18   Constant, Peggy, MD  hydrOXYzine (VISTARIL) 50 MG capsule Take 1 capsule (50 mg total) by mouth every 6 (six) hours as needed. 03/10/18   Constant, Peggy, MD  loratadine (CLARITIN) 10 MG tablet Take 10 mg by mouth daily as needed for allergies.     [provider]  metFORMIN (GLUCOPHAGE) 1000 MG tablet Take 1 tablet (1,000 mg total) by mouth at bedtime. 05/09/18   Constant, Peggy, MD  metFORMIN (GLUCOPHAGE) 500 MG tablet Take 1 pill at bedtime 04/19/18   Clovia Cuff C, MD  omeprazole (PRILOSEC) 20 MG capsule Take 1 capsule (20 mg total) by mouth daily. 05/31/18   Larra Crunkleton, Barbette Hair, MD  oxyCODONE-acetaminophen (PERCOCET/ROXICET) 5-325 MG tablet Take 5 tablets by mouth 3 (three) times daily. 01/19/18   [provider]  Prenatal-Fe Fum-Methf-FA w/o A (VITAFOL-NANO) 18-0.6-0.4 MG TABS Take 1 tablet by mouth daily. 11/26/16   Kandis Cocking A, CNM  zolpidem (AMBIEN) 10 MG tablet Take 1 tablet (10 mg total) by mouth at bedtime as needed for sleep. 03/15/17 04/14/17  Constant, Peggy, MD  zolpidem (AMBIEN) 5 MG tablet Take 1 tablet (5 mg total) by mouth at bedtime as needed for sleep. 03/10/18   Constant, Peggy, MD    Family History Family History  Problem Relation Age of Onset  . Diabetes Maternal Grandmother   . Hypertension Maternal Grandmother   . Anemia Mother     Social History Social History   Tobacco Use  . Smoking status: Never Smoker  . Smokeless tobacco: Never Used  Substance Use Topics  . Alcohol use: No  . Drug use: No     Allergies   Vicodin [hydrocodone-acetaminophen]   Review of Systems Review of Systems  Constitutional: Negative for fever.  Respiratory: Negative for cough and shortness of breath.   Cardiovascular: Positive for chest pain.  Negative for leg swelling.  Gastrointestinal: Positive for nausea. Negative for abdominal pain and vomiting.  Genitourinary: Negative for vaginal bleeding.  Neurological: Negative for weakness and numbness.  All other systems reviewed and are negative.    Physical Exam Updated Vital Signs BP 105/61   Pulse (!) 110   Temp 98.2 F (36.8 C) (Oral)   Resp (!) 29   Ht '5\' 7"'$  (1.702  m)   Wt 121.6 kg (268 lb)   LMP 10/22/2017 (Approximate)   SpO2 98%   BMI 41.97 kg/m   Physical Exam  Constitutional: She is oriented to person, place, and time. She appears well-developed and well-nourished. No distress.  HENT:  Head: Normocephalic and atraumatic.  Neck: Neck supple.  Cardiovascular: Regular rhythm, normal heart sounds and normal pulses.  Tachycardia  Pulmonary/Chest: Effort normal. No respiratory distress. She has no wheezes.  Abdominal: Soft. Bowel sounds are normal. There is no rebound and no guarding.  Avid abdomen above the umbilicus no tenderness to palpation  Neurological: She is alert and oriented to person, place, and time.  Skin: Skin is warm and dry.  Psychiatric: She has a normal mood and affect.  Nursing note and vitals reviewed.    ED Treatments / Results  Labs (all labs ordered are listed, but only abnormal results are displayed) Labs Reviewed  BASIC METABOLIC PANEL - Abnormal; Notable for the following components:      Result Value   Sodium 134 (*)    CO2 21 (*)    Glucose, Bld 231 (*)    BUN <5 (*)    Calcium 8.7 (*)    All other components within normal limits  CBC - Abnormal; Notable for the following components:   WBC 13.7 (*)    RBC 3.85 (*)    Hemoglobin 11.2 (*)    HCT 35.5 (*)    All other components within normal limits  HEPATIC FUNCTION PANEL - Abnormal; Notable for the following components:   Total Protein 5.4 (*)    Albumin 2.6 (*)    AST 67 (*)    All other components within normal limits  CBG MONITORING, ED - Abnormal; Notable for the  following components:   Glucose-Capillary 137 (*)    All other components within normal limits  LIPASE, BLOOD  I-STAT TROPONIN, ED    EKG EKG Interpretation  Date/Time:  Tuesday May 31 2018 03:41:00 EDT Ventricular Rate:  138 PR Interval:  156 QRS Duration: 86 QT Interval:  268 QTC Calculation: 406 R Axis:   60 Text Interpretation:  Sinus tachycardia Nonspecific T wave abnormality Abnormal ECG Confirmed by Thayer Jew [phone]) on 05/31/2018 4:01:27 AM Also confirmed by Thayer Jew [phone]), editor Hattie Perch [phone])  on 05/31/2018 6:54:18 AM   Radiology Dg Chest 2 View  Result Date: 05/31/2018 CLINICAL DATA:  42 year old female with chest pain. EXAM: CHEST - 2 VIEW COMPARISON:  Chest CT dated 03/01/2013 FINDINGS: The heart size and mediastinal contours are within normal limits. Both lungs are clear. The visualized skeletal structures are unremarkable. IMPRESSION: No active cardiopulmonary disease. Electronically Signed   By: Anner Crete M.D.   On: 05/31/2018 05:22    Procedures Procedures (including critical care time)  Medications Ordered in ED Medications  sodium chloride 0.9 % bolus 1,000 mL (0 mLs Intravenous Stopped 05/31/18 0608)  alum & mag hydroxide-simeth (MAALOX/MYLANTA) 200-200-20 MG/5ML suspension 30 mL (30 mLs Oral Given 05/31/18 0607)  lactated ringers bolus 500 mL (0 mLs Intravenous Stopped 05/31/18 0635)  famotidine (PEPCID) tablet 20 mg (20 mg Oral Given 05/31/18 4403)     Initial Impression / Assessment and Plan / ED Course  I have reviewed the triage vital signs and the nursing notes.  Pertinent labs & imaging results that were available during my care of the patient were reviewed by me and considered in my medical decision making (see chart for details).  Clinical Course as of May 31 9562  Tue May 31, 2018  0622 On recheck, patient has had some improvement of her pain.  Minimal with Maalox.  Her initial work-up is reassuring.   Tachycardia has improved to 110 with fluids.  Fetal monitoring is reassuring.  No contractions.  I discussed with patient the possibility of PE.  We discussed the risk and benefits to rule out PE.  Patient feels that this is more related to her stomach and "could be an ulcer."  I discussed with her that that is also very much a possibility.  She would like to defer CT scanning and continue to be monitored to see if symptoms improve.   [CH]    Clinical Course User Index [CH] Clotilde Loth, Barbette Hair, MD    She presents with chest pain.  Described as burning.  Additionally reports nausea and belching.  She is tachycardic on exam out of proportion to pregnancy.  Otherwise she is nontoxic-appearing.  EKG shows no signs of ischemia or strain.  Troponin is negative.  Lab work-up is notable for a glucose of 213.  Patient was initially given Maalox.  She did have progressive improvement in pain and tachycardia improved.  Given her pregnancy, PE is also a consideration.  However, she does have additional GI symptoms which do make me lean closer to a GI diagnosis.  We discussed the risk and benefits of CT scan.  Patient would like to be avoid CT.  She was given additional Pepcid.  On final recheck, she states complete resolution of her symptoms.  My suspicion is this is likely GI related.  I again discussed with the patient that I could not 100% rule out PE but given absence of other signs of DVT and improvement with acid reducing medication, feel it is reasonable to start her on daily omeprazole and have her follow-up closely with her primary OB.  She does take metformin for gestational hypertension.  Again, follow-up with OB for further adjustments in gestational diabetes medications.  After history, exam, and medical workup I feel the patient has been appropriately medically screened and is safe for discharge home. Pertinent diagnoses were discussed with the patient. Patient was given return precautions.  Final  Clinical Impressions(s) / ED Diagnoses   Final diagnoses:  Atypical chest pain  Hyperglycemia during pregnancy    ED Discharge Orders        Ordered    omeprazole (PRILOSEC) 20 MG capsule  Daily     05/31/18 0653       Merryl Hacker, MD 05/31/18 [phone]

## 2018-05-31 NOTE — ED Triage Notes (Signed)
Pt reports chest pain radiating to neck and R arm x1 hour. Pt is 8 months pregnant. Described as a burning sensation, pressure.

## 2018-05-31 NOTE — Progress Notes (Deleted)
Rapid Response OB received call patient 259w5d chest pain and chest pressure. Non pregnancy related issue. Nurse to Doppler fetal heart tones and chart HR.

## 2018-06-06 ENCOUNTER — Ambulatory Visit (INDEPENDENT_AMBULATORY_CARE_PROVIDER_SITE_OTHER): Payer: Medicaid Other | Admitting: Obstetrics and Gynecology

## 2018-06-06 ENCOUNTER — Encounter: Payer: Self-pay | Admitting: Obstetrics and Gynecology

## 2018-06-06 VITALS — BP 106/72 | HR 105 | Wt 264.7 lb

## 2018-06-06 DIAGNOSIS — O09219 Supervision of pregnancy with history of pre-term labor, unspecified trimester: Secondary | ICD-10-CM

## 2018-06-06 DIAGNOSIS — E119 Type 2 diabetes mellitus without complications: Secondary | ICD-10-CM

## 2018-06-06 DIAGNOSIS — O09899 Supervision of other high risk pregnancies, unspecified trimester: Secondary | ICD-10-CM

## 2018-06-06 DIAGNOSIS — O99340 Other mental disorders complicating pregnancy, unspecified trimester: Secondary | ICD-10-CM

## 2018-06-06 DIAGNOSIS — F329 Major depressive disorder, single episode, unspecified: Secondary | ICD-10-CM

## 2018-06-06 DIAGNOSIS — M5431 Sciatica, right side: Secondary | ICD-10-CM

## 2018-06-06 DIAGNOSIS — O0993 Supervision of high risk pregnancy, unspecified, third trimester: Secondary | ICD-10-CM

## 2018-06-06 MED ORDER — GLYBURIDE 5 MG PO TABS: 5.0000 mg | ORAL_TABLET | Freq: Every day | ORAL | 2 refills | Status: DC

## 2018-06-06 NOTE — Progress Notes (Signed)
   PRENATAL VISIT NOTE  Subjective:  Shelly Lane is a 42 y.o. [id number] at 6714w4d being seen today for ongoing prenatal care.  She is currently monitored for the following issues for this high-risk pregnancy and has OBESITY; ANXIETY; BULIMIA; Depression affecting pregnancy, antepartum; SYMPTOM, PAIN, ABDOMINAL, EPIGASTRIC; Elderly multigravida with antepartum condition or complication; SAB (spontaneous abortion) x 4; [place] multipara; Hx of preterm delivery x 2, currently pregnant; Hx gestational diabetes x 3; Multiple drug allergies; History of abnormal cervical Pap smear; Supervision of high-risk pregnancy; Chronic pain disorder; Drug abuse (HCC); DM (diabetes mellitus), type 2 (HCC); and Sciatic nerve pain, right on their problem list.  Patient reports no complaints.  Contractions: Irregular. Vag. Bleeding: None.  Movement: Present. Denies leaking of fluid.   The following portions of the patient's history were reviewed and updated as appropriate: allergies, current medications, past family history, past medical history, past social history, past surgical history and problem list. Problem list updated.  Objective:   Vitals:   06/06/18 1426  BP: 106/72  Pulse: (!) 105  Weight: 264 lb 11.2 oz (120.1 kg)    Fetal Status: Fetal Heart Rate (bpm): 152 Fundal Height: 32 cm Movement: Present     General:  Alert, oriented and cooperative. Patient is in no acute distress.  Skin: Skin is warm and dry. No rash noted.   Cardiovascular: Normal heart rate noted  Respiratory: Normal respiratory effort, no problems with respiration noted  Abdomen: Soft, gravid, appropriate for gestational age.  Pain/Pressure: Present     Pelvic: Cervical exam deferred        Extremities: Normal range of motion.  Edema: Trace  Mental Status: Normal mood and affect. Normal behavior. Normal judgment and thought content.   Assessment and Plan:  Pregnancy: [id number] at 7814w4d  1. Supervision of high risk pregnancy in  third trimester Patient is doing well without complaints  2. Type 2 diabetes mellitus without complication, without long-term current use of insulin (HCC) Patient discontinued metformin as her values were in the 200 range. She started glyburide 5 mg (Rx from a previous pregnancy) and reports good results CBGs reviewed and all in range including fasting Follow up BPP and growth ultrasound 7/5 Weekly BPP with NST starting week of 7/8 - US MFM FETAL BPP WO NON STRESS; Future  3. Depression affecting pregnancy, antepartum Stable on current regimen  4. Sciatic nerve pain, right Patient is doing stretching exercises   5. Hx of preterm delivery x 2, currently pregnant Declined 17-P  Preterm labor symptoms and general obstetric precautions including but not limited to vaginal bleeding, contractions, leaking of fluid and fetal movement were reviewed in detail with the patient. Please refer to After Visit Summary for other counseling recommendations.  Return in about 1 week (around 06/13/2018) for ROB, NST.  Future Appointments  Date Time Provider Department Center  06/10/2018  2:45 PM [facility] US 2 [facility]    Catalina AntiguaPeggy Cayson Kalb, MD

## 2018-06-06 NOTE — Progress Notes (Signed)
Patient reports fetal movement with some pressure. Patient states that she stopped taking metformin and started taking glyburide 5mg  that she had from previous pregnancy because it helped control her BG better.

## 2018-06-10 ENCOUNTER — Encounter (HOSPITAL_COMMUNITY): Payer: Self-pay

## 2018-06-10 ENCOUNTER — Ambulatory Visit (HOSPITAL_COMMUNITY)
Admission: RE | Admit: 2018-06-10 | Discharge: 2018-06-10 | Disposition: A | Discharge status: Home / Self Care | Payer: Medicaid Other | Source: Ambulatory Visit | Attending: Certified Nurse Midwife | Admitting: Certified Nurse Midwife

## 2018-06-10 DIAGNOSIS — O09529 Supervision of elderly multigravida, unspecified trimester: Secondary | ICD-10-CM

## 2018-06-10 DIAGNOSIS — E119 Type 2 diabetes mellitus without complications: Secondary | ICD-10-CM

## 2018-06-10 DIAGNOSIS — O99213 Obesity complicating pregnancy, third trimester: Secondary | ICD-10-CM | POA: Diagnosis not present

## 2018-06-10 DIAGNOSIS — O09523 Supervision of elderly multigravida, third trimester: Secondary | ICD-10-CM | POA: Insufficient documentation

## 2018-06-10 DIAGNOSIS — O09213 Supervision of pregnancy with history of pre-term labor, third trimester: Secondary | ICD-10-CM | POA: Diagnosis not present

## 2018-06-10 DIAGNOSIS — M5431 Sciatica, right side: Secondary | ICD-10-CM

## 2018-06-10 DIAGNOSIS — O24113 Pre-existing diabetes mellitus, type 2, in pregnancy, third trimester: Secondary | ICD-10-CM | POA: Diagnosis not present

## 2018-06-10 DIAGNOSIS — Z3A32 32 weeks gestation of pregnancy: Secondary | ICD-10-CM | POA: Diagnosis not present

## 2018-06-13 ENCOUNTER — Ambulatory Visit (INDEPENDENT_AMBULATORY_CARE_PROVIDER_SITE_OTHER): Payer: Medicaid Other | Admitting: Obstetrics and Gynecology

## 2018-06-13 ENCOUNTER — Encounter: Payer: Self-pay | Admitting: Obstetrics and Gynecology

## 2018-06-13 VITALS — BP 96/66 | HR 108 | Wt 262.9 lb

## 2018-06-13 DIAGNOSIS — O0993 Supervision of high risk pregnancy, unspecified, third trimester: Secondary | ICD-10-CM

## 2018-06-13 DIAGNOSIS — O09529 Supervision of elderly multigravida, unspecified trimester: Secondary | ICD-10-CM

## 2018-06-13 DIAGNOSIS — O09523 Supervision of elderly multigravida, third trimester: Secondary | ICD-10-CM

## 2018-06-13 DIAGNOSIS — O99340 Other mental disorders complicating pregnancy, unspecified trimester: Secondary | ICD-10-CM

## 2018-06-13 DIAGNOSIS — O24313 Unspecified pre-existing diabetes mellitus in pregnancy, third trimester: Secondary | ICD-10-CM | POA: Diagnosis not present

## 2018-06-13 DIAGNOSIS — O99343 Other mental disorders complicating pregnancy, third trimester: Secondary | ICD-10-CM

## 2018-06-13 DIAGNOSIS — E119 Type 2 diabetes mellitus without complications: Secondary | ICD-10-CM

## 2018-06-13 DIAGNOSIS — F32A Depression, unspecified: Secondary | ICD-10-CM

## 2018-06-13 DIAGNOSIS — F329 Major depressive disorder, single episode, unspecified: Secondary | ICD-10-CM

## 2018-06-13 NOTE — Progress Notes (Signed)
NST DX GDM 

## 2018-06-13 NOTE — Progress Notes (Signed)
   PRENATAL VISIT NOTE  Subjective:  Shelly Lane is a 42 y.o. [id number] at 6077w4d being seen today for ongoing prenatal care.  She is currently monitored for the following issues for this high-risk pregnancy and has OBESITY; ANXIETY; BULIMIA; Depression affecting pregnancy, antepartum; SYMPTOM, PAIN, ABDOMINAL, EPIGASTRIC; Elderly multigravida with antepartum condition or complication; SAB (spontaneous abortion) x 4; [place] multipara; Hx of preterm delivery x 2, currently pregnant; Hx gestational diabetes x 3; Multiple drug allergies; History of abnormal cervical Pap smear; Supervision of high-risk pregnancy; Chronic pain disorder; Drug abuse (HCC); DM (diabetes mellitus), type 2 (HCC); and Sciatic nerve pain, right on their problem list.  Patient reports no complaints.  Contractions: Irregular. Vag. Bleeding: None.  Movement: Present. Denies leaking of fluid.   The following portions of the patient's history were reviewed and updated as appropriate: allergies, current medications, past family history, past medical history, past social history, past surgical history and problem list. Problem list updated.  Objective:   Vitals:   06/13/18 1508  BP: 96/66  Pulse: (!) 108  Weight: 262 lb 14.4 oz (119.3 kg)    Fetal Status: Fetal Heart Rate (bpm): NST   Movement: Present     General:  Alert, oriented and cooperative. Patient is in no acute distress.  Skin: Skin is warm and dry. No rash noted.   Cardiovascular: Normal heart rate noted  Respiratory: Normal respiratory effort, no problems with respiration noted  Abdomen: Soft, gravid, appropriate for gestational age.  Pain/Pressure: Present     Pelvic: Cervical exam deferred        Extremities: Normal range of motion.  Edema: Trace  Mental Status: Normal mood and affect. Normal behavior. Normal judgment and thought content.   Assessment and Plan:  Pregnancy: [id number] at 4377w4d  1. Supervision of high risk pregnancy in third  trimester Patient is doing well without complaints  2. Type 2 diabetes mellitus without complication, without long-term current use of insulin (HCC) Patient continues with glyburide CBGs reviewed and all within normal range Continue ASA Normal growth ultrasound 06/10/2018 Continue antenatal testing  NST reviewed and reactive with baseline 140, mod variability, + accels, no decels  3. Depression affecting pregnancy, antepartum Continue current regimen Stable  4. Elderly multigravida with antepartum condition or complication Low risks NIPS  Preterm labor symptoms and general obstetric precautions including but not limited to vaginal bleeding, contractions, leaking of fluid and fetal movement were reviewed in detail with the patient. Please refer to After Visit Summary for other counseling recommendations.  Return in about 1 week (around 06/20/2018) for ROB, NST.  Future Appointments  Date Time Provider Department Center  06/17/2018  3:00 PM [facility] US 3 [facility]  06/20/2018  3:15 PM Marthann Abshier, Gigi GinPeggy, MD [facility] None  06/27/2018  3:15 PM Hermina StaggersErvin, Michael L, MD [facility] None  07/04/2018  3:30 PM Salar Molden, Gigi GinPeggy, MD [facility] None  07/14/2018  3:00 PM Jailine Lieder, Gigi GinPeggy, MD [facility] None    Catalina AntiguaPeggy Nancyjo Givhan, MD

## 2018-06-17 ENCOUNTER — Other Ambulatory Visit: Payer: Self-pay | Admitting: Obstetrics and Gynecology

## 2018-06-17 ENCOUNTER — Ambulatory Visit (HOSPITAL_COMMUNITY)
Admission: RE | Admit: 2018-06-17 | Discharge: 2018-06-17 | Disposition: A | Discharge status: Home / Self Care | Payer: Medicaid Other | Source: Ambulatory Visit | Attending: Obstetrics and Gynecology | Admitting: Obstetrics and Gynecology

## 2018-06-17 ENCOUNTER — Encounter (HOSPITAL_COMMUNITY): Payer: Self-pay

## 2018-06-17 DIAGNOSIS — O09293 Supervision of pregnancy with other poor reproductive or obstetric history, third trimester: Secondary | ICD-10-CM

## 2018-06-17 DIAGNOSIS — Z3A33 33 weeks gestation of pregnancy: Secondary | ICD-10-CM

## 2018-06-17 DIAGNOSIS — O99213 Obesity complicating pregnancy, third trimester: Secondary | ICD-10-CM

## 2018-06-17 DIAGNOSIS — E119 Type 2 diabetes mellitus without complications: Secondary | ICD-10-CM

## 2018-06-17 DIAGNOSIS — O24113 Pre-existing diabetes mellitus, type 2, in pregnancy, third trimester: Secondary | ICD-10-CM | POA: Diagnosis present

## 2018-06-17 DIAGNOSIS — O09523 Supervision of elderly multigravida, third trimester: Secondary | ICD-10-CM | POA: Diagnosis not present

## 2018-06-17 DIAGNOSIS — M5431 Sciatica, right side: Secondary | ICD-10-CM

## 2018-06-20 ENCOUNTER — Other Ambulatory Visit (HOSPITAL_COMMUNITY): Payer: Self-pay | Admitting: *Deleted

## 2018-06-20 ENCOUNTER — Encounter (HOSPITAL_COMMUNITY): Payer: Self-pay | Admitting: *Deleted

## 2018-06-20 ENCOUNTER — Inpatient Hospital Stay (HOSPITAL_COMMUNITY)
Admission: AD | Admit: 2018-06-20 | Discharge: 2018-06-20 | Disposition: A | Discharge status: Home / Self Care | Payer: Medicaid Other | Source: Ambulatory Visit | Attending: Obstetrics & Gynecology | Admitting: Obstetrics & Gynecology

## 2018-06-20 ENCOUNTER — Inpatient Hospital Stay (HOSPITAL_COMMUNITY): Payer: Medicaid Other

## 2018-06-20 ENCOUNTER — Ambulatory Visit (INDEPENDENT_AMBULATORY_CARE_PROVIDER_SITE_OTHER): Payer: Medicaid Other | Admitting: Obstetrics and Gynecology

## 2018-06-20 ENCOUNTER — Encounter: Payer: Self-pay | Admitting: Obstetrics and Gynecology

## 2018-06-20 VITALS — BP 99/70 | HR 111 | Wt 269.0 lb

## 2018-06-20 DIAGNOSIS — M5431 Sciatica, right side: Secondary | ICD-10-CM | POA: Diagnosis not present

## 2018-06-20 DIAGNOSIS — Z3A33 33 weeks gestation of pregnancy: Secondary | ICD-10-CM | POA: Diagnosis not present

## 2018-06-20 DIAGNOSIS — O09529 Supervision of elderly multigravida, unspecified trimester: Secondary | ICD-10-CM

## 2018-06-20 DIAGNOSIS — O24419 Gestational diabetes mellitus in pregnancy, unspecified control: Secondary | ICD-10-CM

## 2018-06-20 DIAGNOSIS — O24113 Pre-existing diabetes mellitus, type 2, in pregnancy, third trimester: Secondary | ICD-10-CM | POA: Diagnosis not present

## 2018-06-20 DIAGNOSIS — O99340 Other mental disorders complicating pregnancy, unspecified trimester: Secondary | ICD-10-CM

## 2018-06-20 DIAGNOSIS — F32A Depression, unspecified: Secondary | ICD-10-CM

## 2018-06-20 DIAGNOSIS — E119 Type 2 diabetes mellitus without complications: Secondary | ICD-10-CM

## 2018-06-20 DIAGNOSIS — O288 Other abnormal findings on antenatal screening of mother: Secondary | ICD-10-CM

## 2018-06-20 DIAGNOSIS — Z79899 Other long term (current) drug therapy: Secondary | ICD-10-CM | POA: Diagnosis not present

## 2018-06-20 DIAGNOSIS — O24414 Gestational diabetes mellitus in pregnancy, insulin controlled: Secondary | ICD-10-CM | POA: Insufficient documentation

## 2018-06-20 DIAGNOSIS — O26893 Other specified pregnancy related conditions, third trimester: Secondary | ICD-10-CM | POA: Insufficient documentation

## 2018-06-20 DIAGNOSIS — Z7982 Long term (current) use of aspirin: Secondary | ICD-10-CM | POA: Insufficient documentation

## 2018-06-20 DIAGNOSIS — O0993 Supervision of high risk pregnancy, unspecified, third trimester: Secondary | ICD-10-CM

## 2018-06-20 DIAGNOSIS — F329 Major depressive disorder, single episode, unspecified: Secondary | ICD-10-CM

## 2018-06-20 DIAGNOSIS — L2084 Intrinsic (allergic) eczema: Secondary | ICD-10-CM

## 2018-06-20 MED ORDER — INSULIN ASPART 100 UNIT/ML ~~LOC~~ SOLN: SUBCUTANEOUS | 12 refills | Status: DC

## 2018-06-20 MED ORDER — CLOBETASOL PROPIONATE 0.05 % EX OINT: TOPICAL_OINTMENT | CUTANEOUS | 1 refills | Status: DC

## 2018-06-20 MED ORDER — INSULIN NPH (HUMAN) (ISOPHANE) 100 UNIT/ML ~~LOC~~ SUSP: SUBCUTANEOUS | 3 refills | Status: DC

## 2018-06-20 NOTE — Discharge Instructions (Signed)

## 2018-06-20 NOTE — MAU Note (Signed)
urine in lab 

## 2018-06-20 NOTE — MAU Note (Signed)
Patient states she was seen in the office today and failed her NST.  Here for monitoring.  Denies pain at this time.

## 2018-06-20 NOTE — Progress Notes (Signed)
   PRENATAL VISIT NOTE  Subjective:  Shelly Lane is a 42 y.o. [id number] at 5422w4d being seen today for ongoing prenatal care.  She is currently monitored for the following issues for this high-risk pregnancy and has OBESITY; ANXIETY; BULIMIA; Depression affecting pregnancy, antepartum; SYMPTOM, PAIN, ABDOMINAL, EPIGASTRIC; Elderly multigravida with antepartum condition or complication; SAB (spontaneous abortion) x 4; Grand multipara; Hx of preterm delivery x 2, currently pregnant; Hx gestational diabetes x 3; Multiple drug allergies; History of abnormal cervical Pap smear; Supervision of high-risk pregnancy; Chronic pain disorder; Drug abuse (HCC); DM (diabetes mellitus), type 2 (HCC); and Sciatic nerve pain, right on their problem list.  Patient reports increased pelvic pressure and sciatic nerve pain.  Contractions: Not present.  .  Movement: Present. Denies leaking of fluid.   The following portions of the patient's history were reviewed and updated as appropriate: allergies, current medications, past family history, past medical history, past social history, past surgical history and problem list. Problem list updated.  Objective:   Vitals:   06/20/18 1526  BP: 99/70  Pulse: (!) 111  Weight: 269 lb (122 kg)    Fetal Status: Fetal Heart Rate (bpm): NST   Movement: Present     General:  Alert, oriented and cooperative. Patient is in no acute distress.  Skin: Skin is warm and dry. No rash noted.   Cardiovascular: Normal heart rate noted  Respiratory: Normal respiratory effort, no problems with respiration noted  Abdomen: Soft, gravid, appropriate for gestational age.  Pain/Pressure: Present     Pelvic: Cervical exam performed Dilation: Fingertip Effacement (%): Thick Station: Ballotable  Extremities: Normal range of motion.  Edema: Trace  Mental Status: Normal mood and affect. Normal behavior. Normal judgment and thought content.   Assessment and Plan:  Pregnancy: [id number] at  7622w4d  1. Supervision of high risk pregnancy in third trimester Patient is doing well Encouraged the use of a maternity support belt and staying active  2. Type 2 diabetes mellitus without complication, without long-term current use of insulin (HCC) Patient has not been able to adhere to her diet. Due to her pain with movement, she is not cooking as often as is eating out a lot CBGs reviewed and all within range with the exception of 7/12 and forward when patient was not able to follow diet with fasting values as high as 101 and pp 201 Patient agrees to insulin.  NST reviewed and reactive with baseline 140, mod variability,  No accels, no decels BPP today STAT Normal growth on 7/5  3. Depression affecting pregnancy, antepartum Stable  4. Elderly multigravida with antepartum condition or complication Low risks NIPS  Preterm labor symptoms and general obstetric precautions including but not limited to vaginal bleeding, contractions, leaking of fluid and fetal movement were reviewed in detail with the patient. Please refer to After Visit Summary for other counseling recommendations.  Return in about 1 week (around 06/27/2018) for ROB, NST.  Future Appointments  Date Time Provider Department Center  06/24/2018  3:30 PM [facility] US 3 [facility]  06/27/2018  3:15 PM Hermina StaggersErvin, Michael L, MD [facility] None  07/01/2018  3:00 PM [facility] US 3 [facility]  07/04/2018  3:30 PM Dayami Taitt, Gigi GinPeggy, MD [facility] None  07/08/2018  3:30 PM [facility] US 5 [facility]  07/14/2018  3:00 PM Jailin Moomaw, Gigi GinPeggy, MD [facility] None    Catalina AntiguaPeggy Ossiel Marchio, MD

## 2018-06-20 NOTE — MAU Provider Note (Signed)
History     CSN: [id number]  Arrival date and time: 06/20/18 1617   First Provider Initiated Contact with Patient 06/20/18 1659      Chief Complaint  Patient presents with  . Non-stress Test   [id number] '@33'$ .4 wks sent from office for NRNST. Reports good FM. No VB, LOF, or reg ctx. Her pregnancy is complicated by GDM on insulin, grand multiparity, PTD x2.    OB History    Gravida  13   Para  9   Term  7   Preterm  2   AB  3   Living  9     SAB  3   TAB      Ectopic      Multiple  0   Live Births  9           Past Medical History:  Diagnosis Date  . Abnormal Pap smear    Colpo;Last pap 2012, ASCUS w/ negative HPV  . Anemia    during preg  . Anxiety   . ASCUS with positive high risk HPV   . Depression    ok now  . Eczema   . GERD (gastroesophageal reflux disease)   . Gestational diabetes    diet controlled  . H/O candidiasis   . H/O migraine   . H/O rubella   . H/O varicella   . H/O: depression   . H/O: obesity   . Headache(784.0)   . Hx: UTI (urinary tract infection)   . Miscarriage    3/13 and 7/13   . Preterm labor   . Vaginal Pap smear, abnormal     Past Surgical History:  Procedure Laterality Date  . CHOLECYSTECTOMY  2008  . HERNIA REPAIR  2012  . HIATAL HERNIA REPAIR  2012  . WISDOM TOOTH EXTRACTION      Family History  Problem Relation Age of Onset  . Diabetes Maternal Grandmother   . Hypertension Maternal Grandmother   . Anemia Mother     Social History   Tobacco Use  . Smoking status: Never Smoker  . Smokeless tobacco: Never Used  Substance Use Topics  . Alcohol use: No  . Drug use: No    Allergies:  Allergies  Allergen Reactions  . Vicodin [Hydrocodone-Acetaminophen] Itching    Medications Prior to Admission  Medication Sig Dispense Refill Last Dose  . ACCU-CHEK FASTCLIX LANCETS MISC 1 Units by Percutaneous route 4 (four) times daily. 100 each 12 Taking  . aspirin EC 81 MG tablet Take 1 tablet (81 mg  total) by mouth daily. Take after 12 weeks for prevention of preeclampsia later in pregnancy 300 tablet 2 Taking  . Blood Glucose Monitoring Suppl (ACCU-CHEK NANO SMARTVIEW) w/Device KIT 1 kit by Subdermal route as directed. Check blood sugars for fasting, and two hours after breakfast, lunch and dinner (4 checks daily) 1 kit 0 Taking  . clobetasol ointment (TEMOVATE) 0.05 % APPLY TOPICALLY TO THE AFFECTED AREA TWICE DAILY 80 g 1   . cyclobenzaprine (FLEXERIL) 10 MG tablet Take 10 mg by mouth 3 (three) times daily as needed for muscle spasms.   Taking  . FLUoxetine (PROZAC) 40 MG capsule Take 1 capsule (40 mg total) by mouth daily. 30 capsule 3 Taking  . fluticasone (FLONASE) 50 MCG/ACT nasal spray Place 2 sprays into both nostrils daily as needed for rhinitis.    Taking  . gabapentin (NEURONTIN) 300 MG capsule TAKE ONE CAPSULE BY MOUTH THREE TIMES DAILY 90 capsule 2 Taking  .  glucose blood (ACCU-CHEK SMARTVIEW) test strip Use as instructed to check blood sugars 100 each 12 Taking  . glyBURIDE (DIABETA) 5 MG tablet Take 5 mg by mouth daily with breakfast.   Not Taking  . glyBURIDE (DIABETA) 5 MG tablet Take 1 tablet (5 mg total) by mouth at bedtime. 30 tablet 2 Taking  . hydrOXYzine (VISTARIL) 50 MG capsule Take 1 capsule (50 mg total) by mouth every 6 (six) hours as needed. 45 capsule 2 Taking  . insulin aspart (NOVOLOG) 100 UNIT/ML injection Inject into skin 27 units with breakfast and 20 units with dinner 10 mL 12   . insulin NPH Human (HUMULIN N,NOVOLIN N) 100 UNIT/ML injection Inject into skin 54 units with breakfast and 20 units in evening 10 mL 3   . loratadine (CLARITIN) 10 MG tablet Take 10 mg by mouth daily as needed for allergies.    Not Taking  . omeprazole (PRILOSEC) 20 MG capsule Take 1 capsule (20 mg total) by mouth daily. (Patient not taking: Reported on 06/17/2018) 30 capsule 0 Not Taking  . oxycodone (OXY-IR) 5 MG capsule Take 10 mg by mouth every 4 (four) hours as needed.   Taking   . oxyCODONE-acetaminophen (PERCOCET/ROXICET) 5-325 MG tablet Take 5 tablets by mouth 3 (three) times daily.  0 Not Taking  . Prenatal-Fe Fum-Methf-FA w/o A (VITAFOL-NANO) 18-0.6-0.4 MG TABS Take 1 tablet by mouth daily. 30 tablet 12 Taking  . zolpidem (AMBIEN) 10 MG tablet Take 1 tablet (10 mg total) by mouth at bedtime as needed for sleep. 30 tablet 3 Past Week at Unknown time  . zolpidem (AMBIEN) 5 MG tablet Take 1 tablet (5 mg total) by mouth at bedtime as needed for sleep. 30 tablet 1 Taking    Review of Systems  Gastrointestinal: Negative for abdominal pain.  Genitourinary: Negative for vaginal bleeding.   Physical Exam   Blood pressure 130/72, pulse (!) 105, temperature 97.7 F (36.5 C), temperature source Oral, resp. rate 20, weight 269 lb 3 oz (122.1 kg), last menstrual period 10/22/2017, SpO2 100 %, unknown if currently breastfeeding.  Physical Exam  Constitutional: She is oriented to person, place, and time. She appears well-developed and well-nourished. No distress.  HENT:  Head: Normocephalic and atraumatic.  Neck: Normal range of motion.  Respiratory: Effort normal. No respiratory distress.  Musculoskeletal: Normal range of motion.  Neurological: She is alert and oriented to person, place, and time.  Skin: Skin is warm and dry.  Psychiatric: She has a normal mood and affect.  EFM: 140 bpm, mod variability, no accels, no decels Toco: irritatbility  No results found for this or any previous visit (from the past 24 hour(s)).  MAU Course  Procedures  MDM BPP ordered. BPP 8/8, AFI 10cm, transverse presentation. Stable for discharge home.   Assessment and Plan   1. [redacted] weeks gestation of pregnancy   2. Sciatic nerve pain, right   3. Non-stress test nonreactive    Discharge home Follow up in OB office next week as scheduled FMCs  Allergies as of 06/20/2018      Reactions   Vicodin [hydrocodone-acetaminophen] Itching      Medication List    STOP taking these  medications   glyBURIDE 5 MG tablet Commonly known as:  DIABETA   oxycodone 5 MG capsule Commonly known as:  OXY-IR   oxyCODONE-acetaminophen 5-325 MG tablet Commonly known as:  PERCOCET/ROXICET     TAKE these medications   ACCU-CHEK FASTCLIX LANCETS Misc 1 Units by Percutaneous route  4 (four) times daily.   ACCU-CHEK NANO SMARTVIEW w/Device Kit 1 kit by Subdermal route as directed. Check blood sugars for fasting, and two hours after breakfast, lunch and dinner (4 checks daily)   aspirin EC 81 MG tablet Take 1 tablet (81 mg total) by mouth daily. Take after 12 weeks for prevention of preeclampsia later in pregnancy   clobetasol ointment 0.05 % Commonly known as:  TEMOVATE APPLY TOPICALLY TO THE AFFECTED AREA TWICE DAILY   cyclobenzaprine 10 MG tablet Commonly known as:  FLEXERIL Take 10 mg by mouth 3 (three) times daily as needed for muscle spasms.   FLUoxetine 40 MG capsule Commonly known as:  PROZAC Take 1 capsule (40 mg total) by mouth daily.   fluticasone 50 MCG/ACT nasal spray Commonly known as:  FLONASE Place 2 sprays into both nostrils daily as needed for rhinitis.   gabapentin 300 MG capsule Commonly known as:  NEURONTIN TAKE ONE CAPSULE BY MOUTH THREE TIMES DAILY   glucose blood test strip Commonly known as:  ACCU-CHEK SMARTVIEW Use as instructed to check blood sugars   hydrOXYzine 50 MG capsule Commonly known as:  VISTARIL Take 1 capsule (50 mg total) by mouth every 6 (six) hours as needed.   insulin aspart 100 UNIT/ML injection Commonly known as:  novoLOG Inject into skin 27 units with breakfast and 20 units with dinner   insulin NPH Human 100 UNIT/ML injection Commonly known as:  HUMULIN N,NOVOLIN N Inject into skin 54 units with breakfast and 20 units in evening   loratadine 10 MG tablet Commonly known as:  CLARITIN Take 10 mg by mouth daily as needed for allergies.   omeprazole 20 MG capsule Commonly known as:  PRILOSEC Take 1 capsule (20  mg total) by mouth daily.   VITAFOL-NANO 18-0.6-0.4 MG Tabs Take 1 tablet by mouth daily.   zolpidem 5 MG tablet Commonly known as:  AMBIEN Take 1 tablet (5 mg total) by mouth at bedtime as needed for sleep. What changed:  Another medication with the same name was removed. Continue taking this medication, and follow the directions you see here.      Julianne Handler, CNM 06/20/2018, 5:03 PM

## 2018-06-21 ENCOUNTER — Telehealth: Payer: Self-pay | Admitting: *Deleted

## 2018-06-21 ENCOUNTER — Other Ambulatory Visit: Payer: Self-pay

## 2018-06-21 DIAGNOSIS — E119 Type 2 diabetes mellitus without complications: Secondary | ICD-10-CM

## 2018-06-21 MED ORDER — INSULIN STARTER KIT- PEN NEEDLES (ENGLISH): 1.0000 | Freq: Once | Status: DC

## 2018-06-21 NOTE — Telephone Encounter (Signed)
Patient called in and stated that she did not receive the syringes for her insulin sent to the pharmacy yesterday with her prescriptions that were sent, please advise.Marland Kitchen..Marland Kitchen

## 2018-06-22 ENCOUNTER — Other Ambulatory Visit: Payer: Self-pay

## 2018-06-22 MED ORDER — "INSULIN SYRINGE-NEEDLE U-100 27.5G X 5/8"" 2 ML MISC": 1.0000 | 4 refills | Status: DC | PRN

## 2018-06-23 ENCOUNTER — Other Ambulatory Visit: Payer: Self-pay | Admitting: Obstetrics and Gynecology

## 2018-06-24 ENCOUNTER — Ambulatory Visit (HOSPITAL_COMMUNITY): Payer: Medicaid Other

## 2018-06-27 ENCOUNTER — Encounter: Payer: Self-pay | Admitting: Obstetrics and Gynecology

## 2018-06-27 ENCOUNTER — Ambulatory Visit (INDEPENDENT_AMBULATORY_CARE_PROVIDER_SITE_OTHER): Payer: Medicaid Other | Admitting: Obstetrics and Gynecology

## 2018-06-27 VITALS — BP 105/71 | HR 114 | Wt 272.5 lb

## 2018-06-27 DIAGNOSIS — E118 Type 2 diabetes mellitus with unspecified complications: Secondary | ICD-10-CM

## 2018-06-27 DIAGNOSIS — O24113 Pre-existing diabetes mellitus, type 2, in pregnancy, third trimester: Secondary | ICD-10-CM

## 2018-06-27 DIAGNOSIS — O0993 Supervision of high risk pregnancy, unspecified, third trimester: Secondary | ICD-10-CM | POA: Diagnosis not present

## 2018-06-27 DIAGNOSIS — O09529 Supervision of elderly multigravida, unspecified trimester: Secondary | ICD-10-CM

## 2018-06-27 NOTE — Progress Notes (Signed)
Subjective:  Shelly Lane is a 42 y.o. [id number] at 313w4d being seen today for ongoing prenatal care.  She is currently monitored for the following issues for this high-risk pregnancy and has OBESITY; ANXIETY; BULIMIA; Depression affecting pregnancy, antepartum; SYMPTOM, PAIN, ABDOMINAL, EPIGASTRIC; Elderly multigravida with antepartum condition or complication; SAB (spontaneous abortion) x 4; Grand multipara; Hx of preterm delivery x 2, currently pregnant; Hx gestational diabetes x 3; Multiple drug allergies; History of abnormal cervical Pap smear; Supervision of high-risk pregnancy; Chronic pain disorder; Drug abuse (HCC); DM (diabetes mellitus), type 2 (HCC); and Sciatic nerve pain, right on their problem list.  Patient reports no complaints.  Contractions: Irregular. Vag. Bleeding: None.  Movement: Present. Denies leaking of fluid.   The following portions of the patient's history were reviewed and updated as appropriate: allergies, current medications, past family history, past medical history, past social history, past surgical history and problem list. Problem list updated.  Objective:   Vitals:   06/27/18 1529  BP: 105/71  Pulse: (!) 114  Weight: 272 lb 8 oz (123.6 kg)    Fetal Status: Fetal Heart Rate (bpm): NST   Movement: Present     General:  Alert, oriented and cooperative. Patient is in no acute distress.  Skin: Skin is warm and dry. No rash noted.   Cardiovascular: Normal heart rate noted  Respiratory: Normal respiratory effort, no problems with respiration noted  Abdomen: Soft, gravid, appropriate for gestational age. Pain/Pressure: Present     Pelvic:  Cervical exam deferred        Extremities: Normal range of motion.  Edema: Trace  Mental Status: Normal mood and affect. Normal behavior. Normal judgment and thought content.   Urinalysis:      Assessment and Plan:  Pregnancy: [id number] at 783w4d  1. Supervision of high risk pregnancy in third trimester Stable  2.  Type 2 diabetes mellitus with complication, unspecified whether long term insulin use (HCC) Pt reports CBG's in goal range however she did not bring readings Importance having CBG's at appts discussed NST reactive today Continue with twice weekly antenatal testing - Fetal nonstress test  3. Elderly multigravida with antepartum condition or complication Low risk panorama  Preterm labor symptoms and general obstetric precautions including but not limited to vaginal bleeding, contractions, leaking of fluid and fetal movement were reviewed in detail with the patient. Please refer to After Visit Summary for other counseling recommendations.  Return in about 1 week (around 07/04/2018) for OB visit.   Hermina StaggersErvin, Ahtziry Saathoff L, MD

## 2018-06-29 ENCOUNTER — Other Ambulatory Visit: Payer: Self-pay | Admitting: Obstetrics and Gynecology

## 2018-06-30 ENCOUNTER — Encounter: Payer: Self-pay | Admitting: Obstetrics and Gynecology

## 2018-07-01 ENCOUNTER — Encounter (HOSPITAL_COMMUNITY): Payer: Self-pay | Admitting: *Deleted

## 2018-07-01 ENCOUNTER — Encounter (HOSPITAL_COMMUNITY): Payer: Self-pay

## 2018-07-01 ENCOUNTER — Inpatient Hospital Stay (HOSPITAL_COMMUNITY)
Admission: AD | Admit: 2018-07-01 | Discharge: 2018-07-03 | DRG: 833 | Disposition: A | Discharge status: Home / Self Care | Payer: Medicaid Other | Attending: Obstetrics and Gynecology | Admitting: Obstetrics and Gynecology

## 2018-07-01 ENCOUNTER — Ambulatory Visit (HOSPITAL_BASED_OUTPATIENT_CLINIC_OR_DEPARTMENT_OTHER)
Admission: RE | Admit: 2018-07-01 | Discharge: 2018-07-01 | Disposition: A | Discharge status: Home / Self Care | Payer: Medicaid Other | Source: Ambulatory Visit | Attending: Obstetrics | Admitting: Obstetrics

## 2018-07-01 ENCOUNTER — Other Ambulatory Visit: Payer: Self-pay

## 2018-07-01 ENCOUNTER — Other Ambulatory Visit (HOSPITAL_COMMUNITY): Payer: Self-pay | Admitting: Obstetrics and Gynecology

## 2018-07-01 DIAGNOSIS — Z3A35 35 weeks gestation of pregnancy: Secondary | ICD-10-CM

## 2018-07-01 DIAGNOSIS — O99353 Diseases of the nervous system complicating pregnancy, third trimester: Secondary | ICD-10-CM | POA: Diagnosis present

## 2018-07-01 DIAGNOSIS — O09523 Supervision of elderly multigravida, third trimester: Secondary | ICD-10-CM

## 2018-07-01 DIAGNOSIS — O99213 Obesity complicating pregnancy, third trimester: Secondary | ICD-10-CM

## 2018-07-01 DIAGNOSIS — O24113 Pre-existing diabetes mellitus, type 2, in pregnancy, third trimester: Secondary | ICD-10-CM | POA: Diagnosis present

## 2018-07-01 DIAGNOSIS — O24419 Gestational diabetes mellitus in pregnancy, unspecified control: Secondary | ICD-10-CM | POA: Diagnosis not present

## 2018-07-01 DIAGNOSIS — O0943 Supervision of pregnancy with grand multiparity, third trimester: Secondary | ICD-10-CM

## 2018-07-01 DIAGNOSIS — E118 Type 2 diabetes mellitus with unspecified complications: Secondary | ICD-10-CM

## 2018-07-01 DIAGNOSIS — M5440 Lumbago with sciatica, unspecified side: Secondary | ICD-10-CM | POA: Diagnosis present

## 2018-07-01 DIAGNOSIS — E11649 Type 2 diabetes mellitus with hypoglycemia without coma: Secondary | ICD-10-CM | POA: Diagnosis not present

## 2018-07-01 DIAGNOSIS — M5431 Sciatica, right side: Secondary | ICD-10-CM

## 2018-07-01 LAB — CBC
HCT: 34.7 % — ABNORMAL LOW (ref 36.0–46.0)
Hemoglobin: 11.5 g/dL — ABNORMAL LOW (ref 12.0–15.0)
MCH: 29.3 pg (ref 26.0–34.0)
MCHC: 33.1 g/dL (ref 30.0–36.0)
MCV: 88.5 fL (ref 78.0–100.0)
Platelets: 175 10*3/uL (ref 150–400)
RBC: 3.92 MIL/uL (ref 3.87–5.11)
RDW: 15.4 % (ref 11.5–15.5)
WBC: 8.5 10*3/uL (ref 4.0–10.5)

## 2018-07-01 LAB — COMPREHENSIVE METABOLIC PANEL
ALT: 15 U/L (ref 0–44)
AST: 19 U/L (ref 15–41)
Albumin: 2.6 g/dL — ABNORMAL LOW (ref 3.5–5.0)
Alkaline Phosphatase: 83 U/L (ref 38–126)
Anion gap: 9 (ref 5–15)
BUN: <5 mg/dL — ABNORMAL LOW (ref 6–20)
CO2: 20 mmol/L — ABNORMAL LOW (ref 22–32)
Calcium: 8.5 mg/dL — ABNORMAL LOW (ref 8.9–10.3)
Chloride: 107 mmol/L (ref 98–111)
Creatinine, Ser: 0.34 mg/dL — ABNORMAL LOW (ref 0.44–1.00)
GFR calc Af Amer: >60 mL/min (ref >60)
GFR calc non Af Amer: >60 mL/min (ref >60)
Glucose, Bld: 46 mg/dL — ABNORMAL LOW (ref 70–99)
Potassium: 3.2 mmol/L — ABNORMAL LOW (ref 3.5–5.1)
Sodium: 136 mmol/L (ref 135–145)
Total Bilirubin: 0.5 mg/dL (ref 0.3–1.2)
Total Protein: 5.7 g/dL — ABNORMAL LOW (ref 6.5–8.1)

## 2018-07-01 LAB — GLUCOSE, CAPILLARY
Glucose-Capillary: 84 mg/dL (ref 70–99)
Glucose-Capillary: 94 mg/dL (ref 70–99)

## 2018-07-01 LAB — TYPE AND SCREEN
ABO/RH(D): O POS
Antibody Screen: NEGATIVE

## 2018-07-01 MED ORDER — CYCLOBENZAPRINE HCL 10 MG PO TABS
10.0000 mg | ORAL_TABLET | Freq: Three times a day (TID) | ORAL | Status: DC | PRN
  Administered 2018-07-01 – 2018-07-03 (×4): 10 mg via ORAL
  Filled 2018-07-01 (×6): qty 1

## 2018-07-01 MED ORDER — INSULIN ASPART 100 UNIT/ML ~~LOC~~ SOLN
0.0000 [IU] | Freq: Three times a day (TID) | SUBCUTANEOUS | Status: DC
  Administered 2018-07-02 – 2018-07-03 (×2): 4 [IU] via SUBCUTANEOUS

## 2018-07-01 MED ORDER — HYDROXYZINE HCL 50 MG PO TABS
50.0000 mg | ORAL_TABLET | Freq: Three times a day (TID) | ORAL | Status: DC
  Administered 2018-07-02: 50 mg via ORAL
  Filled 2018-07-01 (×5): qty 1

## 2018-07-01 MED ORDER — ENOXAPARIN SODIUM 40 MG/0.4ML ~~LOC~~ SOLN
40.0000 mg | SUBCUTANEOUS | Status: DC
  Administered 2018-07-02: 40 mg via SUBCUTANEOUS
  Filled 2018-07-01 (×2): qty 0.4

## 2018-07-01 MED ORDER — INSULIN ASPART 100 UNIT/ML ~~LOC~~ SOLN: 15.0000 [IU] | Freq: Three times a day (TID) | SUBCUTANEOUS | Status: DC

## 2018-07-01 MED ORDER — ZOLPIDEM TARTRATE 5 MG PO TABS: 5.0000 mg | ORAL_TABLET | Freq: Every evening | ORAL | Status: DC | PRN

## 2018-07-01 MED ORDER — OXYCODONE HCL 5 MG PO TABS
5.0000 mg | ORAL_TABLET | ORAL | Status: DC | PRN
Start: 2018-07-01 — End: 2018-07-03
  Administered 2018-07-01 – 2018-07-03 (×5): 5 mg via ORAL
  Filled 2018-07-01 (×5): qty 1

## 2018-07-01 MED ORDER — ACETAMINOPHEN 325 MG PO TABS
650.0000 mg | ORAL_TABLET | ORAL | Status: DC | PRN
  Administered 2018-07-01: 650 mg via ORAL
  Filled 2018-07-01: qty 2

## 2018-07-01 MED ORDER — INSULIN NPH (HUMAN) (ISOPHANE) 100 UNIT/ML ~~LOC~~ SUSP
40.0000 [IU] | Freq: Every day | SUBCUTANEOUS | Status: DC
  Filled 2018-07-01: qty 10

## 2018-07-01 MED ORDER — GABAPENTIN 300 MG PO CAPS
300.0000 mg | ORAL_CAPSULE | Freq: Three times a day (TID) | ORAL | Status: DC
  Administered 2018-07-01 – 2018-07-03 (×5): 300 mg via ORAL
  Filled 2018-07-01 (×6): qty 1

## 2018-07-01 MED ORDER — DOCUSATE SODIUM 100 MG PO CAPS
100.0000 mg | ORAL_CAPSULE | Freq: Every day | ORAL | Status: DC
  Administered 2018-07-02 – 2018-07-03 (×2): 100 mg via ORAL
  Filled 2018-07-01 (×4): qty 1

## 2018-07-01 MED ORDER — PRENATAL MULTIVITAMIN CH
1.0000 | ORAL_TABLET | Freq: Every day | ORAL | Status: DC
  Administered 2018-07-02 – 2018-07-03 (×2): 1 via ORAL
  Filled 2018-07-01 (×2): qty 1

## 2018-07-01 MED ORDER — CALCIUM CARBONATE ANTACID 500 MG PO CHEW: 2.0000 | CHEWABLE_TABLET | ORAL | Status: DC | PRN

## 2018-07-01 NOTE — H&P (Signed)
Shelly MinerLinda E Lane is a 42 y.o. female admitted for diabetic regulation. She is a [id number] at 4938w1d [facility] A[facility] DM with 4 or 5 days of erratic CBG control, with pt on an erratic eating schedule.  She reports that she eats breakfast around noon, her second meal of the day is around 6 PM the third milliliters BM.  She adjusts her insulin to correspond to her meals but she is somewhat unclear as to which is her long-acting and short acting medication.  She takes the insulin that is 54 units every morning which she normally takes around 11 AM and yesterday she cut it to 27 units.  I believe this was her Humulin(NPH)  She reports that she takes 20 units of this at night( midnight -3 am).  She additionally takes NovoLog 27 units before her first meal, 0 with her midday meal and 20 units with her evening meal at 11 PM.  Her blood sugars have been describes erratic usually with lowest blood sugars in the early afternoon around 7: P.m. to 8 PM.  She had an episode of passing out a couple days ago around 6 PM.  She also describes her self is having intermittent elevated sugars as well.  She does not record her blood sugars but does have a glucometer which I will review. .  Due to the erratic nature of her medicines and the confused history, I am admitting her for insulin management and adjustments.  The patient will adhere to a hospital timeframe while in the hospital and those to adjust this phase forward to 6 hours when she resumes her home lifestyle with late sleeping and staying up most of the night going to bed at 3 AM to 5 AM. OB History    Gravida  13   Para  9   Term  7   Preterm  2   AB  3   Living  9     SAB  3   TAB      Ectopic      Multiple  0   Live Births  9          Past Medical History:  Diagnosis Date  . Abnormal Pap smear    Colpo;Last pap 2012, ASCUS w/ negative HPV  . Anemia    during preg  . Anxiety   . ASCUS with positive high risk HPV   . Depression     ok now  . Eczema   . GERD (gastroesophageal reflux disease)   . Gestational diabetes    diet controlled  . H/O candidiasis   . H/O migraine   . H/O rubella   . H/O varicella   . H/O: depression   . H/O: obesity   . Headache(784.0)   . Hx: UTI (urinary tract infection)   . Miscarriage    3/13 and 7/13   . Preterm labor   . Vaginal Pap smear, abnormal    Past Surgical History:  Procedure Laterality Date  . CHOLECYSTECTOMY  2008  . HERNIA REPAIR  2012  . HIATAL HERNIA REPAIR  2012  . WISDOM TOOTH EXTRACTION     Family History: family history includes Anemia in her mother; Diabetes in her maternal grandmother; Hypertension in her maternal grandmother. Social History:  reports that she has never smoked. She has never used smokeless tobacco. She reports that she does not drink alcohol or use drugs.     Maternal Diabetes: Yes:  Diabetes Type:  Insulin/Medication controlled Genetic Screening: Normal Maternal Ultrasounds/Referrals: Normal Fetal Ultrasounds or other Referrals:  Referred to Materal Fetal Medicine  Maternal Substance Abuse:  No Significant Maternal Medications:  Meds include: Other:  insulin Significant Maternal Lab Results:  Lab values include: Other: HGB A1C 5.2 Other Comments:  recent hypoglycemia.  ROS History   Blood pressure (!) 135/30, pulse (!) 117, temperature 98.6 F (37 C), resp. rate 18, height 5\' 7"  (1.702 m), last menstrual period 10/22/2017, unknown if currently breastfeeding. Exam Physical Exam  Prenatal labs: ABO, Rh: O/Positive/-- (02/07 1656) Antibody: Negative (02/07 1656) Rubella: 2.32 (02/07 1656) RPR: Non Reactive (06/03 1702)  HBsAg: Negative (02/07 1656)  HIV: Non Reactive (06/03 1702)  GBS:     Assessment/Plan: Pregnancy 35 weeks 1 day A2 diabetic with erratic blood sugar control Plan admit adjust insulin doses  probable 2-day stay  Twice daily fetal monitoring Diabetic educator consult. Tilda Burrow 07/01/2018, 6:49  PM

## 2018-07-01 NOTE — MAU Note (Signed)
Pt sent from MFM. Had a near syncopal episiode during her NST. Stated she did "passout" yesterday at the store and her blood sugars have been running low ever since. CBG 47 in MFM. Given graham crackers and soda

## 2018-07-01 NOTE — ED Notes (Addendum)
[facility]  Pt states she fell yesterday in [place] at approx 7pm without evaluation.  Pt reports she had just had peanut butter crackers and a reg root beer.  States her BG was 44-46 after the fall.  Pt left message with [facility] today.  Pt states Dr. Jolayne Pantheronstant wants her to come in to be evaluated.  Pt is to register in MAU after her appointment today in [facility].  Pt states she doesn't have any injuries due to her husband catching her and guiding her to the ground.  Pt states she has been having trouble regulating her BG and has been altering her dosage due to "not being able to eat right" and feeling dizzy.  Pt states she stays up all night and sleeps during the day.  She usually awakens around 2pm and takes morning dose at that time, reports night dose around 9-10 pm and takes bedtime dose approximately at 4am.  Pt takes Novolog 27units with breakfast/20 units with dinner as prescribed.  NPH is ordered at 54 units with breakfast, however pt is only taking 20-25 Units at that time.  Evening dose for NPH is 20 units and pt reports taking the ordered dosage. 1610 Report given to Isabel CapriceKathy Wilson, RN in MAU.  Pt started to feel shaky in [facility] and took BG with own glucometer and reports of BG 46, graham crackers and root beer given.  Transported to MAU via wheelchair for further monitoring.

## 2018-07-01 NOTE — MAU Provider Note (Signed)
Chief Complaint:  Hypoglycemia   First Provider Initiated Contact with Patient 07/01/18 1705      HPI: Shelly Lane is a 42 y.o. [id number] at 16w1dwith Type 2 DM vs GDM who presents to maternity admissions sent from MFM with low blood sugars and syncopal episodes recently. She reports 2-3 episodes of syncope in the last week. Yesterday she was shopping and had a syncopal episode. Her husband was present and assisted her to the ground so she did not hit the ground.  She is eating regularly, denies n/v or any other barriers to diet.  She works from home and works third shift hours, going to bed at 3-5 am and sleeps until the early afternoon. She takes her insulin as prescribed but with her late hours, her meal times are at unusual times.  She reports feeling dizzy and nauseous intermittently when her sugars are low. She also reports glucose readings as high as the 200s PP in the last week. There are no other associated symptoms. She has not tried any other treatments.  She reports good fetal movement, denies LOF, vaginal bleeding, vaginal itching/burning, urinary symptoms, h/a, n/v, or fever/chills.    HPI  Past Medical History: Past Medical History:  Diagnosis Date  . Abnormal Pap smear    Colpo;Last pap 2012, ASCUS w/ negative HPV  . Anemia    during preg  . Anxiety   . ASCUS with positive high risk HPV   . Depression    ok now  . Eczema   . GERD (gastroesophageal reflux disease)   . Gestational diabetes    diet controlled  . H/O candidiasis   . H/O migraine   . H/O rubella   . H/O varicella   . H/O: depression   . H/O: obesity   . Headache(784.0)   . Hx: UTI (urinary tract infection)   . Miscarriage    3/13 and 7/13   . Preterm labor   . Vaginal Pap smear, abnormal     Past obstetric history: OB History  Gravida Para Term Preterm AB Living  _0 SAB TAB Ectopic Multiple Live Births  3     0 9    # Outcome Date GA Lbr Len/2nd Weight Sex Delivery Anes PTL Lv   13 Current           12 Term 03/27/17 328w0d1:21 / 00:02 8 lb 9.2 oz (3.89 kg) F Vag-Spont None  LIV  11 Term 11/15/14 3838w1d:17 / 00:07 8 lb 6.2 oz (3.805 kg) F Vag-Spont Local  LIV  10 Term 10/05/13 38w46w5d10 / 00:02 6 lb 7.5 oz (2.934 kg) F Vag-Spont None  LIV  9 SAB 07/09/12 11w028w0d    8 SAB 02/07/12 514w0d [redacted]w[redacted]d      Birth Comments: System Generated. Please review and update pregnancy details.  7 Term 01/23/10 [redacted]w[redacted]d 96w6d6 lb 13.7 oz (3.11 kg) F Vag-Spont EPI  LIV     Birth Comments: System Generated. Please review and update pregnancy details.  6 Preterm 02/08/09 [redacted]w[redacted]d 119w2d lb 9.5 oz (2.99 kg) F Vag-Spont   LIV  5 SAB 2008 514w0d    20w0d4 Preterm 02/21/04 [redacted]w[redacted]d 05:104w2db 5 oz (2.863 kg) F Vag-Spont EPI  LIV  3 Term 08/2002 [redacted]w[redacted]d 21:064w0d 15 oz (4.054 kg) M Vag-Spont EPI  LIV  2 Term 04/1999  59w0d05:00 7 lb 9 oz (3.43 kg) M Vag-Spont EPI  LIV  1 Term 11/1997 370w0d2:30 7 lb 6 oz (3.345 kg) F Vag-Spont EPI  LIV    Past Surgical History: Past Surgical History:  Procedure Laterality Date  . CHOLECYSTECTOMY  2008  . HERNIA REPAIR  2012  . HIATAL HERNIA REPAIR  2012  . WISDOM TOOTH EXTRACTION      Family History: Family History  Problem Relation Age of Onset  . Diabetes Maternal Grandmother   . Hypertension Maternal Grandmother   . Anemia Mother     Social History: Social History   Tobacco Use  . Smoking status: Never Smoker  . Smokeless tobacco: Never Used  Substance Use Topics  . Alcohol use: No  . Drug use: No    Allergies:  Allergies  Allergen Reactions  . Subutex [Buprenorphine] Itching  . Vicodin [Hydrocodone-Acetaminophen] Itching    Meds:  Facility-Administered Medications Prior to Admission  Medication Dose Route Frequency Provider Last Rate Last Dose  . insulin starter kit- pen needles (English) 1 kit  1 kit Other Once Constant, Peggy, MD       Medications Prior to Admission  Medication Sig Dispense Refill Last Dose  . aspirin EC 81  MG tablet Take 1 tablet (81 mg total) by mouth daily. Take after 12 weeks for prevention of preeclampsia later in pregnancy 300 tablet 2 07/01/2018 at Unknown time  . clobetasol ointment (TEMOVATE) 0.05 % APPLY TOPICALLY TO THE AFFECTED AREA TWICE DAILY 80 g 1 06/30/2018 at Unknown time  . cyclobenzaprine (FLEXERIL) 10 MG tablet Take 10 mg by mouth 3 (three) times daily as needed for muscle spasms.   07/01/2018 at Unknown time  . FLUoxetine (PROZAC) 40 MG capsule Take 1 capsule (40 mg total) by mouth daily. 30 capsule 3 07/01/2018 at Unknown time  . gabapentin (NEURONTIN) 300 MG capsule TAKE ONE CAPSULE BY MOUTH THREE TIMES DAILY 90 capsule 2 07/01/2018 at Unknown time  . hydrOXYzine (VISTARIL) 50 MG capsule Take 1 capsule (50 mg total) by mouth every 6 (six) hours as needed. (Patient taking differently: Take 50 mg by mouth every 6 (six) hours as needed for itching. ) 45 capsule 2 06/30/2018 at Unknown time  . insulin aspart (NOVOLOG) 100 UNIT/ML injection Inject into skin 27 units with breakfast and 20 units with dinner (Patient taking differently: Inject 20-27 Units into the skin 2 (two) times daily. Inject into skin 27 units with breakfast and 20 units before bed) 10 mL 12 07/01/2018 at Unknown time  . insulin NPH Human (HUMULIN N,NOVOLIN N) 100 UNIT/ML injection Inject into skin 54 units with breakfast and 20 units in evening (Patient taking differently: Inject 20-54 Units into the skin 2 (two) times daily. Inject into skin 54 units with breakfast and 20 units with dinner) 10 mL 3 07/01/2018 at Unknown time  . Oxycodone HCl 10 MG TABS Take 10 mg by mouth 3 (three) times daily as needed (pain).   0 07/01/2018 at Unknown time  . Prenatal-Fe Fum-Methf-FA w/o A (VITAFOL-NANO) 18-0.6-0.4 MG TABS Take 1 tablet by mouth daily. 30 tablet 12 07/01/2018 at Unknown time  . zolpidem (AMBIEN) 5 MG tablet Take 1 tablet (5 mg total) by mouth at bedtime as needed for sleep. 30 tablet 1 06/30/2018 at Unknown time  . omeprazole  (PRILOSEC) 20 MG capsule Take 1 capsule (20 mg total) by mouth daily. (Patient not taking: Reported on 06/17/2018) 30 capsule 0 Not Taking    ROS:  Review of Systems  Constitutional: Negative for chills, fatigue and fever.  Eyes: Negative for visual disturbance.  Respiratory: Negative for shortness of breath.   Cardiovascular: Positive for leg swelling. Negative for chest pain.  Gastrointestinal: Negative for abdominal pain, nausea and vomiting.  Genitourinary: Negative for difficulty urinating, dysuria, flank pain, pelvic pain, vaginal bleeding, vaginal discharge and vaginal pain.  Neurological: Positive for dizziness and syncope. Negative for headaches.  Psychiatric/Behavioral: Negative.      I have reviewed patient's Past Medical Hx, Surgical Hx, Family Hx, Social Hx, medications and allergies.   Physical Exam   Patient Vitals for the past 24 hrs:  BP Temp Pulse Resp Height  07/01/18 1638 (!) 135/30 98.6 F (37 C) (!) 117 18 _0  (1.702 m)   Constitutional: Well-developed, well-nourished female in no acute distress.  HEART: normal rate, heart sounds, regular rhythm RESP: normal effort, lung sounds clear and equal bilaterally GI: Abd soft, non-tender, gravid appropriate for gestational age.  MS: Extremities nontender, +1 pitting edema BLE, normal ROM Neurologic: Alert and oriented x 4.  GU: Neg CVAT.     FHT:  Baseline 135 , moderate variability, accelerations present, no decelerations Contractions:  Rare, mild to palpation Labs: Results for orders placed or performed during the hospital encounter of 07/01/18 (from the past 24 hour(s))  Glucose, capillary     Status: None   Collection Time: 07/01/18  4:49 PM  Result Value Ref Range   Glucose-Capillary 84 70 - 99 mg/dL  CBC     Status: Abnormal   Collection Time: 07/01/18  5:22 PM  Result Value Ref Range   WBC 8.5 4.0 - 10.5 K/uL   RBC 3.92 3.87 - 5.11 MIL/uL   Hemoglobin 11.5 (L) 12.0 - 15.0 g/dL   HCT 34.7 (L) 36.0  - 46.0 %   MCV 88.5 78.0 - 100.0 fL   MCH 29.3 26.0 - 34.0 pg   MCHC 33.1 30.0 - 36.0 g/dL   RDW 15.4 11.5 - 15.5 %   Platelets 175 150 - 400 K/uL  Comprehensive metabolic panel     Status: Abnormal   Collection Time: 07/01/18  5:22 PM  Result Value Ref Range   Sodium 136 135 - 145 mmol/L   Potassium 3.2 (L) 3.5 - 5.1 mmol/L   Chloride 107 98 - 111 mmol/L   CO2 20 (L) 22 - 32 mmol/L   Glucose, Bld 46 (L) 70 - 99 mg/dL   BUN <5 (L) 6 - 20 mg/dL   Creatinine, Ser 0.34 (L) 0.44 - 1.00 mg/dL   Calcium 8.5 (L) 8.9 - 10.3 mg/dL   Total Protein 5.7 (L) 6.5 - 8.1 g/dL   Albumin 2.6 (L) 3.5 - 5.0 g/dL   AST 19 15 - 41 U/L   ALT 15 0 - 44 U/L   Alkaline Phosphatase 83 38 - 126 U/L   Total Bilirubin 0.5 0.3 - 1.2 mg/dL   GFR calc non Af Amer >60 >60 mL/min   GFR calc Af Amer >60 >60 mL/min   Anion gap 9 5 - 15   O/Positive/-- (02/07 1656)  Imaging:  [place] Mfm Fetal Bpp Wo Non Stress  Result Date: 07/01/2018 ----------------------------------------------------------------------  OBSTETRICS REPORT                      (Signed Final 07/01/2018 04:30 pm) ---------------------------------------------------------------------- Patient Info  ID #:       [id number]  D.O.B.:  11-Jun-1976 (41 yrs)  Name:       Shelly Lane              Visit Date: 07/01/2018 03:22 pm ---------------------------------------------------------------------- Performed By  Performed By:     Rodrigo Ran BS      Ref. Address:     [place]                                                             [place]  Attending:        Tama High MD        Location:         [facility]  Referred By:      [facility]                    [place] ---------------------------------------------------------------------- Orders   #  Description                                 Code   1  [place] MFM FETAL BPP WO NON STRESS              02725.36  ----------------------------------------------------------------------   #  Ordered By               Order #        Accession #    Episode #   1  Tama High             [id number]      [id number]     [id number]  ---------------------------------------------------------------------- Indications   [redacted] weeks gestation of pregnancy                Z6S.06   Obesity complicating pregnancy, third          O13.213   trimester   Advanced maternal age multigravida 54+,  X38.182   third trimester (low risk NIPS)   Pre-existing diabetes, type 2, in pregnancy,   O24.113   third trimester (insulin)   Poor obstetric history: Previous preterm       O09.219   delivery, antepartum (x2-36 weeks)  ---------------------------------------------------------------------- OB History  Gravidity:    13        Term:   7        Prem:   2        SAB:   3  TOP:          0       Ectopic:  0        Living: 9 ---------------------------------------------------------------------- Fetal Evaluation  Num Of Fetuses:     1  Fetal Heart         144  Rate(bpm):  Cardiac Activity:   Observed  Presentation:       Cephalic  Amniotic Fluid  AFI FV:      Subjectively within normal limits  AFI Sum(cm)     %Tile       Largest Pocket(cm)  17.12           63          5.37  RUQ(cm)       RLQ(cm)       LUQ(cm)        LLQ(cm)  5.37          3.17          3.97           4.61 ---------------------------------------------------------------------- Biophysical Evaluation  Amniotic F.V:   Within normal limits       F. Tone:        Observed  F. Movement:    Observed                   Score:          8/8  F. Breathing:   Observed ---------------------------------------------------------------------- Gestational Age  LMP:           36w 0d        Date:  10/22/17                  EDD:   07/29/18  Best:          35w 1d     Det. By:  Loman Chroman         EDD:   08/04/18                                      (12/07/17) ---------------------------------------------------------------------- Impression  Patient has type 2 diabetes and return for antenatal testing.  At your office she was advised to take Humulin 54 units in the  morning and 20 units at night and NovoLog 27/0/20 units with  meals.  Patient reports she had hypoglycemic episode yesterday and  her after contacting your office she was advised to come to  the MAU today.  On ultrasound amniotic fluid is normal and good fetal activity  seen.  Cephalic presentation.  BPP 8/8.  Patient felt shaky and unknown checking her blood glucose  was 47 mg/DL.  She was given crackers and the patient  drank root beer.  She will be going over to the MAU for further management.  Consider inpatient management to optimize insulin treatment. ---------------------------------------------------------------------- Recommendations  Continue weekly antenatal testing till delivery. ----------------------------------------------------------------------  Tama High, MD Electronically Signed Final Report   07/01/2018 04:30 pm ----------------------------------------------------------------------  MAU Course/MDM: I have ordered labs and reviewed results.  CMP with glucose of 46, CBG 84 after crackers, soda NST reviewed and reactive, BPP 8/8 today Consult Dr Glo Herring with presentation, exam findings and test results.  Admit to [facility] Unit for blood glucose management  Assessment: 1. Sciatic nerve pain, right   2. Type 2 diabetes mellitus with complication, unspecified whether long term insulin use ([place])     Plan: Admit to [facility] for blood glucose management Dr Glo Herring to adjust insulin regimen for improved CBGs  Fatima Blank Certified Nurse-Midwife 07/01/2018 8:13 PM

## 2018-07-02 LAB — GLUCOSE, CAPILLARY
Glucose-Capillary: 67 mg/dL — ABNORMAL LOW (ref 70–99)
Glucose-Capillary: 81 mg/dL (ref 70–99)
Glucose-Capillary: 110 mg/dL — ABNORMAL HIGH (ref 70–99)
Glucose-Capillary: 133 mg/dL — ABNORMAL HIGH (ref 70–99)

## 2018-07-02 MED ORDER — FLUOXETINE HCL 20 MG PO CAPS
40.0000 mg | ORAL_CAPSULE | Freq: Every day | ORAL | Status: DC
  Administered 2018-07-02 – 2018-07-03 (×2): 40 mg via ORAL
  Filled 2018-07-02 (×2): qty 2

## 2018-07-02 MED ORDER — SALINE SPRAY 0.65 % NA SOLN
1.0000 | NASAL | Status: DC | PRN
  Administered 2018-07-02: 1 via NASAL
  Filled 2018-07-02: qty 44

## 2018-07-02 MED ORDER — INSULIN ASPART 100 UNIT/ML ~~LOC~~ SOLN
10.0000 [IU] | Freq: Three times a day (TID) | SUBCUTANEOUS | Status: DC
  Administered 2018-07-02 – 2018-07-03 (×4): 10 [IU] via SUBCUTANEOUS

## 2018-07-02 MED ORDER — INSULIN NPH (HUMAN) (ISOPHANE) 100 UNIT/ML ~~LOC~~ SUSP
10.0000 [IU] | Freq: Every evening | SUBCUTANEOUS | Status: DC
  Administered 2018-07-02: 10 [IU] via SUBCUTANEOUS

## 2018-07-02 MED ORDER — LORATADINE 10 MG PO TABS
10.0000 mg | ORAL_TABLET | Freq: Every day | ORAL | Status: DC
  Administered 2018-07-02 – 2018-07-03 (×2): 10 mg via ORAL
  Filled 2018-07-02 (×2): qty 1

## 2018-07-02 MED ORDER — INSULIN NPH (HUMAN) (ISOPHANE) 100 UNIT/ML ~~LOC~~ SUSP
20.0000 [IU] | Freq: Every day | SUBCUTANEOUS | Status: DC
  Administered 2018-07-02 – 2018-07-03 (×2): 20 [IU] via SUBCUTANEOUS
  Filled 2018-07-02: qty 10

## 2018-07-02 NOTE — Progress Notes (Signed)
FACULTY PRACTICE ANTEPARTUM(COMPREHENSIVE) NOTE  Shelly Lane is a 42 y.o. [id number] at [redacted]w[redacted]d  who is admitted for diabetic regulation, having hypoglycemia.   Fetal presentation is cephalic. Pt has sciatica and Low back pain with last few pregnancies, taking Percocet 10/ neurontin300 tid / flexeril 10 tid for this. I have cut percocet down to 5 tid.  Length of Stay:  1  Days  Subjective: Pt had normal cbgs on NO insulin after 6 pm last night.  CBG (last 3)  Recent Labs    07/01/18 1649 07/01/18 2301 07/02/18 0608  GLUCAP 84 94 67*    Patient reports the fetal movement as active. Patient reports uterine contraction  activity as none. Patient reports  vaginal bleeding as none. Patient describes fluid per vagina as None.  Vitals:  Blood pressure 103/65, pulse (!) 102, temperature 98.5 F (36.9 C), temperature source Oral, resp. rate 19, height 5\' 7"  (1.702 m), weight 278 lb 8 oz (126.3 kg), last menstrual period 10/22/2017, SpO2 100 %, unknown if currently breastfeeding. Physical Examination:  General appearance - alert, well appearing, and in no distress, oriented to person, place, and time and overweight Heart - normal rate and regular rhythm Abdomen - soft, nontender, nondistended Fundal Height:  afi 17 bpp 8/8 yesterday Extremities: extremities normal, atraumatic, no cyanosis or edema and Homans sign is negative, no sign of DVT with DTRs 2+ bilaterally Membranes:intact  Fetal Monitoring:  Baseline: 135 bpm, Variability: Good {> 6 bpm), Accelerations: Reactive and Decelerations: Absent  Labs:  Results for orders placed or performed during the hospital encounter of 07/01/18 (from the past 24 hour(s))  Glucose, capillary   Collection Time: 07/01/18  4:49 PM  Result Value Ref Range   Glucose-Capillary 84 70 - 99 mg/dL  CBC   Collection Time: 07/01/18  5:22 PM  Result Value Ref Range   WBC 8.5 4.0 - 10.5 K/uL   RBC 3.92 3.87 - 5.11 MIL/uL   Hemoglobin 11.5 (L) 12.0 -  15.0 g/dL   HCT 54.0 (L) 98.1 - 19.1 %   MCV 88.5 78.0 - 100.0 fL   MCH 29.3 26.0 - 34.0 pg   MCHC 33.1 30.0 - 36.0 g/dL   RDW 47.8 29.5 - 62.1 %   Platelets 175 150 - 400 K/uL  Comprehensive metabolic panel   Collection Time: 07/01/18  5:22 PM  Result Value Ref Range   Sodium 136 135 - 145 mmol/L   Potassium 3.2 (L) 3.5 - 5.1 mmol/L   Chloride 107 98 - 111 mmol/L   CO2 20 (L) 22 - 32 mmol/L   Glucose, Bld 46 (L) 70 - 99 mg/dL   BUN <5 (L) 6 - 20 mg/dL   Creatinine, Ser 3.08 (L) 0.44 - 1.00 mg/dL   Calcium 8.5 (L) 8.9 - 10.3 mg/dL   Total Protein 5.7 (L) 6.5 - 8.1 g/dL   Albumin 2.6 (L) 3.5 - 5.0 g/dL   AST 19 15 - 41 U/L   ALT 15 0 - 44 U/L   Alkaline Phosphatase 83 38 - 126 U/L   Total Bilirubin 0.5 0.3 - 1.2 mg/dL   GFR calc non Af Amer >60 >60 mL/min   GFR calc Af Amer >60 >60 mL/min   Anion gap 9 5 - 15  Type and screen [facility]   Collection Time: 07/01/18 10:17 PM  Result Value Ref Range   ABO/RH(D) O POS    Antibody Screen NEG    Sample Expiration  07/04/2018 Performed at [facility], [address] [place], [place] [address]   Glucose, capillary   Collection Time: 07/01/18 11:01 PM  Result Value Ref Range   Glucose-Capillary 94 70 - 99 mg/dL  Glucose, capillary   Collection Time: 07/02/18  6:08 AM  Result Value Ref Range   Glucose-Capillary 67 (L) 70 - 99 mg/dL   Comment 1 Notify RN     Imaging Studies:     Medications:  Scheduled . docusate sodium  100 mg Oral Daily  . enoxaparin (LOVENOX) injection  40 mg Subcutaneous Q24H  . gabapentin  300 mg Oral TID  . hydrOXYzine  50 mg Oral TID  . insulin aspart  0-24 Units Subcutaneous TID WC  . insulin aspart  10 Units Subcutaneous TID WC  . insulin NPH Human  10 Units Subcutaneous QPM  . insulin NPH Human  20 Units Subcutaneous QAC breakfast  . prenatal multivitamin  1 tablet Oral Q1200   I have reviewed the patient's current medications. Insulins adjusted downward  dramatically.Pt reports that she was on insulin with pregnancy 4, 5, and none again  til this pregnancy due to diet control and exercise. She justifies her current GDM as being due to her inactivity due to pregnancy related sciatica, and eating just what her husband can buy. This may be opportunity for education/ discipline.  ASSESSMENT: Patient Active Problem List   Diagnosis Date Noted  . Gestational diabetes mellitus (GDM) in third trimester 07/01/2018  . Sciatic nerve pain, right 05/09/2018  . DM (diabetes mellitus), type 2 (HCC) 02/10/2018  . Drug abuse (HCC) 11/26/2016  . Chronic pain disorder 09/16/2016  . Supervision of high-risk pregnancy 09/08/2016  . History of abnormal cervical Pap smear 10/04/2013  . Elderly multigravida with antepartum condition or complication 02/13/2013  . SAB (spontaneous abortion) x 4 02/13/2013  . Grand multipara 02/13/2013  . Hx of preterm delivery x 2, currently pregnant 02/13/2013  . Hx gestational diabetes x 3 02/13/2013  . Multiple drug allergies 02/13/2013  . BULIMIA 10/21/2007  . ANXIETY 08/02/2007  . SYMPTOM, PAIN, ABDOMINAL, EPIGASTRIC 08/02/2007  . OBESITY 08/01/2007  . Depression affecting pregnancy, antepartum 08/01/2007    PLAN: 1 GDMA2/B:  Hypoglycemia on current dosing and erratic regimen        Reduced insulin from prior NPH 54/20 and Novolog meal coverage 27/0/20 to NPH 20/10 and Novolog 09/15/09 with SSI based on PC cbg's Pt informed that her erratic schedule(breakfast at noon, dinner at midnight-3 am) and atypical insulin regimen will need to be more predictable and consistent. 2. Chronic back  Pain and sciatica of pregnancy. => Phys therapy consult   Shelly Lane 07/02/2018,9:08 AM    Patient ID: Shelly Lane, female   DOB: 04/18/1976, 42 y.o.   MRN: [id number]

## 2018-07-02 NOTE — Progress Notes (Signed)
  Diabetes Treatment Program Recommendations  ADA Standards of Care 2019 Diabetes in Pregnancy Target Glucose Ranges:  Fasting: 60 - 90 mg/dL Preprandial: 60 - 161105 mg/dL 1 hr postprandial: Less than 140mg /dL (from first bite of meal) 2 hr postprandial: Less than 120 mg/dL (from first bit of meal)   Results for Griselda MinerWILLIAMSON, Trinia E (MRN [id number]) as of 07/02/2018 09:22  Ref. Range 07/01/2018 16:49 07/01/2018 23:01 07/02/2018 06:08  Glucose-Capillary Latest Ref Range: 70 - 99 mg/dL 84 94 67 (L)   Review of Glycemic Control  Outpatient Diabetes medications: NPH 57 units qam, 20 units qpm, Novolog 27 units breakfast, 20 units supper  Current orders for Inpatient glycemic control: NPH 20 units qam, 10 units qpm, Novolog 0-24 units tid, Novolog 10 units tid meal coverage   Spoke with patient over the phone and discussed her home regimen and glucose control. Patient reports in the past she has been on Glyburide at night due to elevated fasting glucose, however due to her sciatica she is not able to exercise and stand to cook as she use to, leading to more elevated glucose trends. She was placed on insulin approx 2 weeks ago. Per her weight and gestational age regimen was appropriate, however she seems very sensitive to insulin during the day.  Her glucose trends have been low during the day usually dropping at late afternoon and rarely any other time. She did wake up once in the past at night with a low. Fasting glucose at home is usually 80-90's. Patient seems to not require as much insulin during the daytime hours.   Discussed diet, discussed the different types of insulin she is on and how they work. Discussed with her to continue to check her glucose trends at home and call her doctor again.   It seems her hypoglycemia could be due to both the NPH dosing and breakfast meal coverage she was on (hypoglycemia within 4-6 hours of administration of these insulins)  Insulin regimen reduced this  admission. Will watch glucose trends on current regimen. Will need lower doses on NPH and Novolog at time of d/c.  Thanks,  Christena DeemShannon Ferdinand Revoir RN, MSN, BC-ADM Inpatient Diabetes Coordinator Team Pager [phone] (8a-5p)

## 2018-07-02 NOTE — Discharge Instructions (Addendum)
Insulin aspart (Novolog), clear insulin - Short acting insulin to cover meals, starts working in 30 minutes and last for 3-4 hours Insulin NPH ( Humulin N, Novolin N), cloudy insulin - Longer acting insulin, starts working in 1 to 2 hours, a peak effect of 4 to 6 hours, and lasts about 12 hours.  Coping With Diabetes Diabetes (type 1 diabetes mellitus or type 2 diabetes mellitus) is a condition in which the body does not have enough of a hormone called insulin, or the body does not respond properly to insulin. Normally, insulin allows sugars (glucose) to enter cells in the body. The cells use glucose for energy. With diabetes, extra glucose builds up in the blood instead of going into cells, which results in high blood glucose (hyperglycemia). How to manage lifestyle changes Managing diabetes includes medical treatments as well as lifestyle changes. If diabetes is not managed well, serious physical and emotional complications can occur. Taking good care of yourself means that you are responsible for:  Monitoring glucose regularly.  Eating a healthy diet.  Exercising regularly.  Meeting with health care providers.  Taking medicines as directed.  Some people may feel a lot of stress about managing their diabetes. This is known as emotional distress, and it is very common. Living with diabetes can place you at risk for emotional distress, depression, or anxiety. These disorders can be confusing and can make diabetes management more difficult. How to recognize stress Emotional distress Symptoms of emotional distress include:  Anger about having a diagnosis of diabetes.  Fear or frustration about your diagnosis and the changes you need to make to manage the condition.  Being overly worried about the care that you need or the cost of the care you need.  Feeling like you caused your condition by doing something wrong.  Fear of unpredictable situations, like low or high blood  glucose.  Feeling judged by your health care providers.  Feeling very alone with the disease.  Getting too tired or "burned out" with the demands of daily care.  Depression Having diabetes means that you are at a higher risk for depression. Having depression also means that you are at a higher risk for diabetes. Your health care provider may test (screen) you for symptoms of depression. It is important to recognize depression symptoms and to start treatment for it soon after it is diagnosed. The following are some symptoms of depression:  Loss of interest in things that you used to enjoy.  Trouble sleeping, or often waking up early and not being able to get back to sleep.  A change in appetite.  Feeling tired most of the day.  Feeling nervous and anxious.  Feeling guilty and worrying that you are a burden to others.  Feeling depressed more often than you do not feel that way.  Thoughts of hurting yourself or feeling that you want to die.  If you have any of these symptoms for 2 weeks or longer, reach out to a health care provider. Where to find support  Ask your health care provider to recommend a therapist who understands both depression and diabetes.  Search for information and support from the American Diabetes Association: www.diabetes.org  Find a certified diabetes educator and make an appointment through American Association of Diabetes Educators: www.diabeteseducator.org Follow these instructions at home: Managing emotional distress The following are some ways to manage emotional distress:  Talk with your health care provider or certified diabetes educator. Consider working with a Fish farm manager.  Learn as much as you can about diabetes and its treatment. Meet with a certified diabetes educator or take a class to learn how to manage your condition.  Keep a journal of your thoughts and concerns.  Accept that some things are out of your control.  Talk with  other people who have diabetes. It can help to talk with others about the emotional distress that you feel.  Find ways to manage stress that work for you. These may include art or music therapy, exercise, meditation, and hobbies.  Seek support from spiritual leaders, family, and friends.  General instructions  Follow your diabetes management plan.  Keep all follow-up visits as told by your health care provider. This is important. Get help right away if:  You have thoughts about hurting yourself or others. If you ever feel like you may hurt yourself or others, or have thoughts about taking your own life, get help right away. You can go to your nearest emergency department or call:  Your local emergency services (911 in the [place]  A suicide crisis helpline, such as the National Suicide Prevention Lifeline at 30968551881-408-589-7541. This is open 24 hours a day.  Summary  Diabetes (type 1 diabetes mellitus or type 2 diabetes mellitus) is a condition in which the body does not have enough of a hormone called insulin, or the body does not respond properly to insulin.  Living with diabetes puts you at risk for medical issues, and it also puts you at risk for emotional issues such as emotional distress, depression, and anxiety.  Recognizing the symptoms of emotional distress and depression may help you avoid problems with your diabetes control. It is important to start treatment for emotional distress and depression soon after they are diagnosed.  Having diabetes means that you are at a higher risk for depression. Ask your health care provider to recommend a therapist who understands both depression and diabetes.  If you experience symptoms of emotional distress or depression, it is important to discuss this with your health care provider, certified diabetes educator, or therapist. This information is not intended to replace advice given to you by your health care provider. Make sure you discuss any  questions you have with your health care provider. Document Released: 04/08/2017 Document Revised: 04/08/2017 Document Reviewed: 04/08/2017 Elsevier Interactive Patient Education  2018 [employer]

## 2018-07-02 NOTE — Progress Notes (Signed)
Nutrition : Diet education  Nutrition Dx: Food and nutrition-related knowledge deficit r/t limited comprehension of previous education aeb pt report.  Pt with GDM during previous pregnancies GDM diet education this pregnancy in April Pt with specific food preferences. Does not like anything that is frozen has to be microwaved Multicultural family with everyone not eating the same food Pt is the only person in the family able to cook well and currently lower back pain is so severe that she is unable to stand and cook. Consequently fast foods are being consumed. Breakfast has been small and CHO only - leading to lower glucose levels  Discussed CHO count, and goal CHO's to shoot for at each meal. How to try to incorporate fast foods into diet as this may often be only option. Inclusion of protein at breakfast. Encourage teenagers in family to cook for Mom and help her meet diet requirements.    Nutrition education consult for Carbohydrate Modified Gestational Diabetic Diet completed.  "Meal  plan for gestational diabetics" handout given to patient.  Basic concepts reviewed.  Questions answered.  Patient verbalizes understanding.   Shelly Lane M.Odis LusterEd. R.D. LDN Neonatal Nutrition Support Specialist/RD III Pager [phone]      Phone [phone]

## 2018-07-03 LAB — GLUCOSE, CAPILLARY
Glucose-Capillary: 143 mg/dL — ABNORMAL HIGH (ref 70–99)
Glucose-Capillary: 77 mg/dL (ref 70–99)

## 2018-07-03 MED ORDER — INSULIN ASPART 100 UNIT/ML ~~LOC~~ SOLN: 10.0000 [IU] | Freq: Three times a day (TID) | SUBCUTANEOUS | 11 refills | Status: DC

## 2018-07-03 MED ORDER — INSULIN NPH (HUMAN) (ISOPHANE) 100 UNIT/ML ~~LOC~~ SUSP: 10.0000 [IU] | Freq: Every evening | SUBCUTANEOUS | 11 refills | Status: DC

## 2018-07-03 MED ORDER — INSULIN NPH (HUMAN) (ISOPHANE) 100 UNIT/ML ~~LOC~~ SUSP: 20.0000 [IU] | Freq: Every day | SUBCUTANEOUS | 11 refills | Status: DC

## 2018-07-03 NOTE — Discharge Summary (Signed)
Physician Discharge Summary  Patient ID: Shelly MinerLinda E Sealy MRN: [id number] DOB/AGE: 03-08-1976 42 y.o.  Admit date: 07/01/2018 Discharge date: 07/03/2018  Admission Diagnoses:66101w3d Gestational diabetes with poor control  Discharge Diagnoses:  Active Problems:   Gestational diabetes mellitus (GDM) in third trimester   Discharged Condition: good  Hospital Course: Shelly Lane is a 42 y.o. female admitted for diabetic regulation. She is a [id number] at 819w1d [facility] with 4 or 5 days of erratic CBG control, with pt on an erratic eating schedule.  She reports that she eats breakfast around noon, her second meal of the day is around 6 PM the third milliliters BM.  She adjusts her insulin to correspond to her meals but she is somewhat unclear as to which is her long-acting and short acting medication.  She takes the insulin that is 54 units every morning which she normally takes around 11 AM and yesterday she cut it to 27 units.  I believe this was her Humulin(NPH)  She reports that she takes 20 units of this at night( midnight -3 am).  She additionally takes NovoLog 27 units before her first meal, 0 with her midday meal and 20 units with her evening meal at 11 PM.  Her blood sugars have been describes erratic usually with lowest blood sugars in the early afternoon around 7: P.m. to 8 PM.  She had an episode of passing out a couple days ago around 6 PM.  She also describes her self is having intermittent elevated sugars as well.  She does not record her blood sugars but does have a glucometer which I will review. .  Due to the erratic nature of her medicines and the confused history, I am admitting her for insulin management and adjustments.  The patient will adhere to a hospital timeframe while in the hospital and those to adjust this phase forward to 6 hours when she resumes her home lifestyle with late sleeping and staying up most of the night going to bed at 3 AM to 5 AM.          OB History    Gravida  13   Para  9   Term  7   Preterm  2   AB  3   Living  9     SAB  3   TAB      Ectopic      Multiple  0   Live Births  9              Past Medical History:  Diagnosis Date  . Abnormal Pap smear    Colpo;Last pap 2012, ASCUS w/ negative HPV  . Anemia    during preg  . Anxiety   . ASCUS with positive high risk HPV   . Depression    ok now  . Eczema   . GERD (gastroesophageal reflux disease)   . Gestational diabetes    diet controlled  . H/O candidiasis   . H/O migraine   . H/O rubella   . H/O varicella   . H/O: depression   . H/O: obesity   . Headache(784.0)   . Hx: UTI (urinary tract infection)   . Miscarriage    3/13 and 7/13   . Preterm labor   . Vaginal Pap smear, abnormal         Past Surgical History:  Procedure Laterality Date  . CHOLECYSTECTOMY  2008  . HERNIA REPAIR  2012  .  HIATAL HERNIA REPAIR  2012  . WISDOM TOOTH EXTRACTION     Family History: family history includes Anemia in her mother; Diabetes in her maternal grandmother; Hypertension in her maternal grandmother. Social History:  reports that she has never smoked. She has never used smokeless tobacco. She reports that she does not drink alcohol or use drugs.     Maternal Diabetes: Yes:  Diabetes Type:  Insulin/Medication controlled Genetic Screening: Normal Maternal Ultrasounds/Referrals: Normal Fetal Ultrasounds or other Referrals:  Referred to Materal Fetal Medicine  Maternal Substance Abuse:  No Significant Maternal Medications:  Meds include: Other:  insulin Significant Maternal Lab Results:  Lab values include: Other: HGB A1C 5.2 Other Comments:  recent hypoglycemia.  ROS History Blood pressure (!) 135/30, pulse (!) 117, temperature 98.6 F (37 C), resp. rate 18, height 5\' 7"  (1.702 m), last menstrual period 10/22/2017, unknown if currently breastfeeding. Exam Physical Exam  Prenatal labs: ABO,  Rh: O/Positive/-- (02/07 1656) Antibody: Negative (02/07 1656) Rubella: 2.32 (02/07 1656) RPR: Non Reactive (06/03 1702)  HBsAg: Negative (02/07 1656)  HIV: Non Reactive (06/03 1702)  GBS:     Assessment/Plan: Pregnancy 35 weeks 1 day A2 diabetic with erratic blood sugar control Plan admit adjust insulin doses  probable 2-day stay     Twice daily fetal monitoring Diabetic educator consult  Diabetes Treatment Program Recommendations  ADA Standards of Care 2019 Diabetes in Pregnancy Target Glucose Ranges:  Fasting: 60 - 90 mg/dL Preprandial: 60 - 098 mg/dL 1 hr postprandial: Less than 140mg /dL (from first bite of meal) 2 hr postprandial: Less than 120 mg/dL (from first bit of meal)   Results for CINZIA, DEVOS (MRN [id number]) as of 07/03/2018 07:42  Ref. Range 07/02/2018 06:08 07/02/2018 10:37 07/02/2018 16:00 07/02/2018 16:38 07/03/2018 00:20  Glucose-Capillary Latest Ref Range: 70 - 99 mg/dL 67 (L) 81 562 (H) 130 (H) 143 (H)   Review of Glycemic Control  Outpatient Diabetes medications: NPH 57 units qam, 20 units qpm, Novolog 27 units breakfast, 20 units supper  Current orders for Inpatient glycemic control: NPH 20 units qam, 10 units qpm, Novolog 0-24 units tid, Novolog 10 units tid meal coverage  Inpatient Diabetes Recommendations:  Glucose elevated at midnight last night in the 140's. If fasting glucose is greater than 90 consider increasing pm NPH to 14 units.  Thanks,  Christena Deem RN, MSN, BC-ADM Inpatient Diabetes Coordinator Team Pager [phone] (8a-5p)      Electronically signed by Sheldon Silvan, RN at 07/03/2018 7:44 AM Electronically signed by Sheldon Silvan, RN at 07/03/2018 7:46 AM     Consults: diabetes education  Significant Diagnostic Studies:  CBG (last 3)  Recent Labs    07/02/18 1638 07/03/18 0020 07/03/18 0728  GLUCAP 133* 143* 77     Treatments: IV hydration and insulin: NPH, Novolog  Discharge Exam: Blood  pressure (!) 144/66, pulse (!) 105, temperature 98 F (36.7 C), temperature source Oral, resp. rate 18, height 5\' 7"  (1.702 m), weight 278 lb 8 oz (126.3 kg), last menstrual period 10/22/2017, SpO2 95 %, unknown if currently breastfeeding. General appearance: alert, cooperative and no distress Extremities: extremities normal, atraumatic, no cyanosis or edema  Disposition: Discharge disposition: 01-Home or Self Care      Patient had acceptable control of her BG for outpatient management  Allergies as of 07/03/2018      Reactions   Subutex [buprenorphine] Itching   Vicodin [hydrocodone-acetaminophen] Itching      Medication List    TAKE these medications  aspirin EC 81 MG tablet Take 1 tablet (81 mg total) by mouth daily. Take after 12 weeks for prevention of preeclampsia later in pregnancy   clobetasol ointment 0.05 % Commonly known as:  TEMOVATE APPLY TOPICALLY TO THE AFFECTED AREA TWICE DAILY   cyclobenzaprine 10 MG tablet Commonly known as:  FLEXERIL Take 10 mg by mouth 3 (three) times daily as needed for muscle spasms.   FLUoxetine 40 MG capsule Commonly known as:  PROZAC Take 1 capsule (40 mg total) by mouth daily.   gabapentin 300 MG capsule Commonly known as:  NEURONTIN TAKE ONE CAPSULE BY MOUTH THREE TIMES DAILY   hydrOXYzine 50 MG capsule Commonly known as:  VISTARIL Take 1 capsule (50 mg total) by mouth every 6 (six) hours as needed. What changed:  reasons to take this   insulin aspart 100 UNIT/ML injection Commonly known as:  novoLOG Inject 10 Units into the skin 3 (three) times daily with meals. What changed:    how much to take  how to take this  when to take this  additional instructions   insulin NPH Human 100 UNIT/ML injection Commonly known as:  HUMULIN N,NOVOLIN N Inject 0.2 mLs (20 Units total) into the skin daily before breakfast. What changed:    how much to take  how to take this  when to take this  additional instructions    insulin NPH Human 100 UNIT/ML injection Commonly known as:  HUMULIN N,NOVOLIN N Inject 0.1 mLs (10 Units total) into the skin every evening. What changed:  You were already taking a medication with the same name, and this prescription was added. Make sure you understand how and when to take each.   omeprazole 20 MG capsule Commonly known as:  PRILOSEC Take 1 capsule (20 mg total) by mouth daily.   Oxycodone HCl 10 MG Tabs Take 10 mg by mouth 3 (three) times daily as needed (pain).   VITAFOL-NANO 18-0.6-0.4 MG Tabs Take 1 tablet by mouth daily.   zolpidem 5 MG tablet Commonly known as:  AMBIEN Take 1 tablet (5 mg total) by mouth at bedtime as needed for sleep.      Follow-up Information    [facility] Follow up in 1 day(s).   Specialty:  Obstetrics and Gynecology Contact information: [address], [address] [place] [address] [phone]          Signed: Scheryl Darter 07/03/2018, 9:34 AM

## 2018-07-03 NOTE — Progress Notes (Signed)
Discharged home, ambulatory, in stable condition. 

## 2018-07-03 NOTE — Progress Notes (Addendum)
  Diabetes Treatment Program Recommendations  ADA Standards of Care 2019 Diabetes in Pregnancy Target Glucose Ranges:  Fasting: 60 - 90 mg/dL Preprandial: 60 - 045105 mg/dL 1 hr postprandial: Less than 140mg /dL (from first bite of meal) 2 hr postprandial: Less than 120 mg/dL (from first bit of meal)   Results for Griselda MinerWILLIAMSON, Zamiya E (MRN [id number]) as of 07/03/2018 07:42  Ref. Range 07/02/2018 06:08 07/02/2018 10:37 07/02/2018 16:00 07/02/2018 16:38 07/03/2018 00:20  Glucose-Capillary Latest Ref Range: 70 - 99 mg/dL 67 (L) 81 782110 (H) 956133 (H) 143 (H)   Review of Glycemic Control  Outpatient Diabetes medications: NPH 57 units qam, 20 units qpm, Novolog 27 units breakfast, 20 units supper  Current orders for Inpatient glycemic control: NPH 20 units qam, 10 units qpm, Novolog 0-24 units tid, Novolog 10 units tid meal coverage  Inpatient Diabetes Recommendations:  Glucose elevated at midnight last night in the 140's. If fasting glucose is greater than 90 consider increasing pm NPH to 14 units.  Thanks,  Christena DeemShannon Katiya Fike RN, MSN, BC-ADM Inpatient Diabetes Coordinator Team Pager [phone] (8a-5p)

## 2018-07-03 NOTE — Progress Notes (Signed)
Discharge instructions given to patient and she verbalized understanding of all instructions given. Written copy of AVS given to patient. 

## 2018-07-04 ENCOUNTER — Encounter: Payer: Self-pay | Admitting: Obstetrics and Gynecology

## 2018-07-04 ENCOUNTER — Other Ambulatory Visit: Payer: Self-pay

## 2018-07-04 ENCOUNTER — Ambulatory Visit (INDEPENDENT_AMBULATORY_CARE_PROVIDER_SITE_OTHER): Payer: Medicaid Other | Admitting: Obstetrics and Gynecology

## 2018-07-04 VITALS — BP 110/69 | HR 111 | Wt 282.4 lb

## 2018-07-04 DIAGNOSIS — F329 Major depressive disorder, single episode, unspecified: Secondary | ICD-10-CM

## 2018-07-04 DIAGNOSIS — M5431 Sciatica, right side: Secondary | ICD-10-CM

## 2018-07-04 DIAGNOSIS — O09529 Supervision of elderly multigravida, unspecified trimester: Secondary | ICD-10-CM

## 2018-07-04 DIAGNOSIS — O0993 Supervision of high risk pregnancy, unspecified, third trimester: Secondary | ICD-10-CM

## 2018-07-04 DIAGNOSIS — O99340 Other mental disorders complicating pregnancy, unspecified trimester: Secondary | ICD-10-CM

## 2018-07-04 DIAGNOSIS — O24414 Gestational diabetes mellitus in pregnancy, insulin controlled: Secondary | ICD-10-CM

## 2018-07-04 NOTE — Progress Notes (Signed)
   PRENATAL VISIT NOTE  Subjective:  Shelly Lane is a 42 y.o. [id number] at 5182w4d being seen today for ongoing prenatal care.  She is currently monitored for the following issues for this high-risk pregnancy and has OBESITY; ANXIETY; BULIMIA; Depression affecting pregnancy, antepartum; SYMPTOM, PAIN, ABDOMINAL, EPIGASTRIC; Elderly multigravida with antepartum condition or complication; SAB (spontaneous abortion) x 4; Grand multipara; Hx of preterm delivery x 2, currently pregnant; Hx gestational diabetes x 3; Multiple drug allergies; History of abnormal cervical Pap smear; Supervision of high-risk pregnancy; Chronic pain disorder; Drug abuse (HCC); DM (diabetes mellitus), type 2 (HCC); Sciatic nerve pain, right; and Gestational diabetes mellitus (GDM) in third trimester on their problem list.  Patient reports no complaints.  Contractions: Irregular. Vag. Bleeding: None.  Movement: Present. Denies leaking of fluid.   The following portions of the patient's history were reviewed and updated as appropriate: allergies, current medications, past family history, past medical history, past social history, past surgical history and problem list. Problem list updated.  Objective:   Vitals:   07/04/18 1544  BP: 110/69  Pulse: (!) 111  Weight: 282 lb 6.4 oz (128.1 kg)    Fetal Status:     Movement: Present     General:  Alert, oriented and cooperative. Patient is in no acute distress.  Skin: Skin is warm and dry. No rash noted.   Cardiovascular: Normal heart rate noted  Respiratory: Normal respiratory effort, no problems with respiration noted  Abdomen: Soft, gravid, appropriate for gestational age.  Pain/Pressure: Present     Pelvic: Cervical exam deferred        Extremities: Normal range of motion.  Edema: Trace  Mental Status: Normal mood and affect. Normal behavior. Normal judgment and thought content.   Assessment and Plan:  Pregnancy: [id number] at 6582w4d  1. Supervision of high risk  pregnancy in third trimester Patient is doing well without complaints Follow up growth ultrasound with [facility] on 8/5 Cultures next visit  2. Insulin controlled gestational diabetes mellitus (GDM) in third trimester [facility] She states that she did not take her insulin today as she has restarted her diet.  CBGs reviewed and all within range Patient instead to commit to diet until completion of pregnancy NST reviewed and reactive with baseline 150, mod variability, +accels, no decels  3. Sciatic nerve pain, right Stable  4. Depression affecting pregnancy, antepartum Stable  5. Elderly multigravida with antepartum condition or complication Low risks NIPS  Preterm labor symptoms and general obstetric precautions including but not limited to vaginal bleeding, contractions, leaking of fluid and fetal movement were reviewed in detail with the patient. Please refer to After Visit Summary for other counseling recommendations.  Return in about 1 week (around 07/11/2018) for ROB, NST.  Future Appointments  Date Time Provider Department Center  07/08/2018  3:30 PM [facility] US 5 [facility]  07/14/2018  3:00 PM Tonantzin Mimnaugh, Gigi GinPeggy, MD [facility] None    Catalina AntiguaPeggy Mykeisha Dysert, MD

## 2018-07-08 ENCOUNTER — Encounter (HOSPITAL_COMMUNITY): Payer: Self-pay

## 2018-07-08 ENCOUNTER — Inpatient Hospital Stay (HOSPITAL_COMMUNITY)
Admission: AD | Admit: 2018-07-08 | Discharge: 2018-07-08 | Disposition: A | Discharge status: Home / Self Care | Payer: Medicaid Other | Source: Ambulatory Visit | Attending: Obstetrics & Gynecology | Admitting: Obstetrics & Gynecology

## 2018-07-08 ENCOUNTER — Ambulatory Visit (HOSPITAL_COMMUNITY)
Admission: RE | Admit: 2018-07-08 | Discharge: 2018-07-08 | Disposition: A | Discharge status: Home / Self Care | Payer: Medicaid Other | Source: Ambulatory Visit | Attending: Obstetrics and Gynecology | Admitting: Obstetrics and Gynecology

## 2018-07-08 ENCOUNTER — Other Ambulatory Visit (HOSPITAL_COMMUNITY): Payer: Self-pay | Admitting: Obstetrics and Gynecology

## 2018-07-08 ENCOUNTER — Encounter (HOSPITAL_COMMUNITY): Payer: Self-pay | Admitting: *Deleted

## 2018-07-08 DIAGNOSIS — Z832 Family history of diseases of the blood and blood-forming organs and certain disorders involving the immune mechanism: Secondary | ICD-10-CM | POA: Diagnosis not present

## 2018-07-08 DIAGNOSIS — Z3A36 36 weeks gestation of pregnancy: Secondary | ICD-10-CM

## 2018-07-08 DIAGNOSIS — Z885 Allergy status to narcotic agent status: Secondary | ICD-10-CM | POA: Diagnosis not present

## 2018-07-08 DIAGNOSIS — O09523 Supervision of elderly multigravida, third trimester: Secondary | ICD-10-CM

## 2018-07-08 DIAGNOSIS — F419 Anxiety disorder, unspecified: Secondary | ICD-10-CM | POA: Diagnosis not present

## 2018-07-08 DIAGNOSIS — O99213 Obesity complicating pregnancy, third trimester: Secondary | ICD-10-CM | POA: Insufficient documentation

## 2018-07-08 DIAGNOSIS — F329 Major depressive disorder, single episode, unspecified: Secondary | ICD-10-CM | POA: Diagnosis not present

## 2018-07-08 DIAGNOSIS — O24313 Unspecified pre-existing diabetes mellitus in pregnancy, third trimester: Secondary | ICD-10-CM

## 2018-07-08 DIAGNOSIS — Z794 Long term (current) use of insulin: Secondary | ICD-10-CM | POA: Insufficient documentation

## 2018-07-08 DIAGNOSIS — Z362 Encounter for other antenatal screening follow-up: Secondary | ICD-10-CM

## 2018-07-08 DIAGNOSIS — Z3689 Encounter for other specified antenatal screening: Secondary | ICD-10-CM

## 2018-07-08 DIAGNOSIS — Z87440 Personal history of urinary (tract) infections: Secondary | ICD-10-CM | POA: Diagnosis not present

## 2018-07-08 DIAGNOSIS — Z7982 Long term (current) use of aspirin: Secondary | ICD-10-CM | POA: Insufficient documentation

## 2018-07-08 DIAGNOSIS — E669 Obesity, unspecified: Secondary | ICD-10-CM | POA: Diagnosis not present

## 2018-07-08 DIAGNOSIS — M5431 Sciatica, right side: Secondary | ICD-10-CM

## 2018-07-08 DIAGNOSIS — O0943 Supervision of pregnancy with grand multiparity, third trimester: Secondary | ICD-10-CM | POA: Diagnosis not present

## 2018-07-08 DIAGNOSIS — E118 Type 2 diabetes mellitus with unspecified complications: Secondary | ICD-10-CM

## 2018-07-08 DIAGNOSIS — O24419 Gestational diabetes mellitus in pregnancy, unspecified control: Secondary | ICD-10-CM

## 2018-07-08 DIAGNOSIS — Z9049 Acquired absence of other specified parts of digestive tract: Secondary | ICD-10-CM | POA: Insufficient documentation

## 2018-07-08 DIAGNOSIS — Z79899 Other long term (current) drug therapy: Secondary | ICD-10-CM | POA: Diagnosis not present

## 2018-07-08 DIAGNOSIS — O99343 Other mental disorders complicating pregnancy, third trimester: Secondary | ICD-10-CM | POA: Insufficient documentation

## 2018-07-08 HISTORY — DX: Opioid use, unspecified, uncomplicated: F11.90

## 2018-07-08 NOTE — MAU Provider Note (Signed)
History     CSN: [id number]  Arrival date and time: 07/08/18 1700   First Provider Initiated Contact with Patient 07/08/18 1920      Chief Complaint  Patient presents with  . Non-stress Test   HPI   [name] is a 42 y.o. female GDM [id number] @ 80w1dhere in MAU for an NST. She was seen by MFM today and had a 6/8 BPP, 2 points off for lack of breathing. + fetal movement, no bleeding, no leaking of fluid. No complaints, asking to go home.   OB History    Gravida  12   Para  9   Term  7   Preterm  2   AB  2   Living  9     SAB  2   TAB      Ectopic      Multiple  0   Live Births  9           Past Medical History:  Diagnosis Date  . Abnormal Pap smear    Colpo;Last pap 2012, ASCUS w/ negative HPV  . Anemia    during preg  . Anxiety   . ASCUS with positive high risk HPV   . Depression    ok now  . Eczema   . GERD (gastroesophageal reflux disease)   . Gestational diabetes    diet controlled  . H/O candidiasis   . H/O migraine   . H/O rubella   . H/O varicella   . H/O: depression   . H/O: obesity   . Headache(784.0)   . Hx: UTI (urinary tract infection)   . Miscarriage    3/13 and 7/13   . Opioid use   . Preterm labor   . Vaginal Pap smear, abnormal     Past Surgical History:  Procedure Laterality Date  . CHOLECYSTECTOMY  2008  . HERNIA REPAIR  2012  . HIATAL HERNIA REPAIR  2012  . WISDOM TOOTH EXTRACTION      Family History  Problem Relation Age of Onset  . Diabetes Maternal Grandmother   . Hypertension Maternal Grandmother   . Anemia Mother     Social History   Tobacco Use  . Smoking status: Never Smoker  . Smokeless tobacco: Never Used  Substance Use Topics  . Alcohol use: No  . Drug use: Yes    Types: Oxycodone    Allergies:  Allergies  Allergen Reactions  . Subutex [Buprenorphine] Itching  . Vicodin [Hydrocodone-Acetaminophen] Itching    Facility-Administered Medications Prior to Admission   Medication Dose Route Frequency Provider Last Rate Last Dose  . insulin starter kit- pen needles (English) 1 kit  1 kit Other Once Constant, Peggy, MD       Medications Prior to Admission  Medication Sig Dispense Refill Last Dose  . aspirin EC 81 MG tablet Take 1 tablet (81 mg total) by mouth daily. Take after 12 weeks for prevention of preeclampsia later in pregnancy 300 tablet 2 Taking  . clobetasol ointment (TEMOVATE) 0.05 % APPLY TOPICALLY TO THE AFFECTED AREA TWICE DAILY 80 g 1 Taking  . cyclobenzaprine (FLEXERIL) 10 MG tablet Take 10 mg by mouth 3 (three) times daily as needed for muscle spasms.   Taking  . FLUoxetine (PROZAC) 40 MG capsule Take 1 capsule (40 mg total) by mouth daily. 30 capsule 3 Taking  . gabapentin (NEURONTIN) 300 MG capsule TAKE ONE CAPSULE BY MOUTH THREE TIMES DAILY 90 capsule 2 Taking  .  hydrOXYzine (VISTARIL) 50 MG capsule Take 1 capsule (50 mg total) by mouth every 6 (six) hours as needed. (Patient taking differently: Take 50 mg by mouth every 6 (six) hours as needed for itching. ) 45 capsule 2 Taking  . insulin aspart (NOVOLOG) 100 UNIT/ML injection Inject 10 Units into the skin 3 (three) times daily with meals. 10 mL 11 Taking  . insulin NPH Human (HUMULIN N,NOVOLIN N) 100 UNIT/ML injection Inject 0.2 mLs (20 Units total) into the skin daily before breakfast. 10 mL 11 Taking  . insulin NPH Human (HUMULIN N,NOVOLIN N) 100 UNIT/ML injection Inject 0.1 mLs (10 Units total) into the skin every evening. 10 mL 11 Taking  . omeprazole (PRILOSEC) 20 MG capsule TAKE ONE CAPSULE BY MOUTH DAILY (Patient not taking: Reported on 07/08/2018) 30 capsule 0 Not Taking  . Oxycodone HCl 10 MG TABS Take 10 mg by mouth 3 (three) times daily as needed (pain).   0 Taking  . Prenatal-Fe Fum-Methf-FA w/o A (VITAFOL-NANO) 18-0.6-0.4 MG TABS Take 1 tablet by mouth daily. 30 tablet 12 Taking  . zolpidem (AMBIEN) 5 MG tablet Take 1 tablet (5 mg total) by mouth at bedtime as needed for sleep. 30  tablet 1 Taking   No results found for this or any previous visit (from the past 14 hour(s)).  Review of Systems  Constitutional: Negative for fever.  Gastrointestinal: Negative for abdominal pain.  Genitourinary: Negative for vaginal bleeding and vaginal discharge.   Physical Exam   Blood pressure 134/79, pulse (!) 103, temperature 98.3 F (36.8 C), temperature source Oral, height 5' 6.25" (1.683 m), weight 279 lb (126.6 kg), last menstrual period 10/22/2017, SpO2 97 %, unknown if currently breastfeeding.  Physical Exam  Constitutional: She is oriented to person, place, and time. She appears well-developed and well-nourished. No distress.  HENT:  Head: Normocephalic.  Eyes: Pupils are equal, round, and reactive to light.  Musculoskeletal: Normal range of motion.  Neurological: She is alert and oriented to person, place, and time.  Skin: Skin is warm. She is not diaphoretic.  Psychiatric: Her behavior is normal.   Fetal Tracing: Baseline: 130 bpm Variability: Moderate  Accelerations: 15x15 Decelerations: None Toco: Occasional, irregular pattern   MAU Course  Procedures  None  MDM  NST reactive, 8/10 BPP  Assessment and Plan   A:  1. NST (non-stress test) reactive   2. [redacted] weeks gestation of pregnancy     P:  Discharge home in stable condition Return to MAU if symptoms worsen F/U with the office as scheduled Kick counts   Rasch, Artist Pais, NP 07/08/2018 7:32 PM

## 2018-07-08 NOTE — Discharge Instructions (Signed)
Braxton Hicks Contractions °Contractions of the uterus can occur throughout pregnancy, but they are not always a sign that you are in labor. You may have practice contractions called Braxton Hicks contractions. These false labor contractions are sometimes confused with true labor. °What are Braxton Hicks contractions? °Braxton Hicks contractions are tightening movements that occur in the muscles of the uterus before labor. Unlike true labor contractions, these contractions do not result in opening (dilation) and thinning of the cervix. Toward the end of pregnancy (32-34 weeks), Braxton Hicks contractions can happen more often and may become stronger. These contractions are sometimes difficult to tell apart from true labor because they can be very uncomfortable. You should not feel embarrassed if you go to the hospital with false labor. °Sometimes, the only way to tell if you are in true labor is for your health care provider to look for changes in the cervix. The health care provider will do a physical exam and may monitor your contractions. If you are not in true labor, the exam should show that your cervix is not dilating and your water has not broken. °If there are other health problems associated with your pregnancy, it is completely safe for you to be sent home with false labor. You may continue to have Braxton Hicks contractions until you go into true labor. °How to tell the difference between true labor and false labor °True labor °· Contractions last 30-70 seconds. °· Contractions become very regular. °· Discomfort is usually felt in the top of the uterus, and it spreads to the lower abdomen and low back. °· Contractions do not go away with walking. °· Contractions usually become more intense and increase in frequency. °· The cervix dilates and gets thinner. °False labor °· Contractions are usually shorter and not as strong as true labor contractions. °· Contractions are usually irregular. °· Contractions  are often felt in the front of the lower abdomen and in the groin. °· Contractions may go away when you walk around or change positions while lying down. °· Contractions get weaker and are shorter-lasting as time goes on. °· The cervix usually does not dilate or become thin. °Follow these instructions at home: °· Take over-the-counter and prescription medicines only as told by your health care provider. °· Keep up with your usual exercises and follow other instructions from your health care provider. °· Eat and drink lightly if you think you are going into labor. °· If Braxton Hicks contractions are making you uncomfortable: °? Change your position from lying down or resting to walking, or change from walking to resting. °? Sit and rest in a tub of warm water. °? Drink enough fluid to keep your urine pale yellow. Dehydration may cause these contractions. °? Do slow and deep breathing several times an hour. °· Keep all follow-up prenatal visits as told by your health care provider. This is important. °Contact a health care provider if: °· You have a fever. °· You have continuous pain in your abdomen. °Get help right away if: °· Your contractions become stronger, more regular, and closer together. °· You have fluid leaking or gushing from your vagina. °· You pass blood-tinged mucus (bloody show). °· You have bleeding from your vagina. °· You have low back pain that you never had before. °· You feel your baby’s head pushing down and causing pelvic pressure. °· Your baby is not moving inside you as much as it used to. °Summary °· Contractions that occur before labor are called Braxton   Hicks contractions, false labor, or practice contractions. °· Braxton Hicks contractions are usually shorter, weaker, farther apart, and less regular than true labor contractions. True labor contractions usually become progressively stronger and regular and they become more frequent. °· Manage discomfort from Braxton Hicks contractions by  changing position, resting in a warm bath, drinking plenty of water, or practicing deep breathing. °This information is not intended to replace advice given to you by your health care provider. Make sure you discuss any questions you have with your health care provider. °Document Released: 04/08/2017 Document Revised: 04/08/2017 Document Reviewed: 04/08/2017 °Elsevier Interactive Patient Education © 2018 [employer] ° °

## 2018-07-08 NOTE — MAU Note (Signed)
Urine in lab 

## 2018-07-08 NOTE — MAU Note (Signed)
Pt sent from MFM with BPP 6/8 for monitoring.

## 2018-07-11 ENCOUNTER — Other Ambulatory Visit: Payer: Self-pay | Admitting: *Deleted

## 2018-07-11 NOTE — Progress Notes (Signed)
error 

## 2018-07-14 ENCOUNTER — Ambulatory Visit (INDEPENDENT_AMBULATORY_CARE_PROVIDER_SITE_OTHER): Payer: Medicaid Other | Admitting: Obstetrics and Gynecology

## 2018-07-14 ENCOUNTER — Encounter (HOSPITAL_COMMUNITY): Payer: Self-pay

## 2018-07-14 ENCOUNTER — Inpatient Hospital Stay (HOSPITAL_COMMUNITY)
Admission: AD | Admit: 2018-07-14 | Discharge: 2018-07-14 | Disposition: A | Discharge status: Home / Self Care | Payer: Medicaid Other | Source: Ambulatory Visit | Attending: Obstetrics & Gynecology | Admitting: Obstetrics & Gynecology

## 2018-07-14 ENCOUNTER — Inpatient Hospital Stay (HOSPITAL_BASED_OUTPATIENT_CLINIC_OR_DEPARTMENT_OTHER): Payer: Medicaid Other

## 2018-07-14 ENCOUNTER — Other Ambulatory Visit (HOSPITAL_COMMUNITY)
Admission: RE | Admit: 2018-07-14 | Discharge: 2018-07-14 | Disposition: A | Discharge status: Home / Self Care | Payer: Medicaid Other | Source: Ambulatory Visit | Attending: Obstetrics and Gynecology | Admitting: Obstetrics and Gynecology

## 2018-07-14 VITALS — BP 112/81 | HR 121 | Wt 281.9 lb

## 2018-07-14 DIAGNOSIS — Z9889 Other specified postprocedural states: Secondary | ICD-10-CM | POA: Diagnosis not present

## 2018-07-14 DIAGNOSIS — O99213 Obesity complicating pregnancy, third trimester: Secondary | ICD-10-CM

## 2018-07-14 DIAGNOSIS — O24419 Gestational diabetes mellitus in pregnancy, unspecified control: Secondary | ICD-10-CM

## 2018-07-14 DIAGNOSIS — K219 Gastro-esophageal reflux disease without esophagitis: Secondary | ICD-10-CM | POA: Diagnosis not present

## 2018-07-14 DIAGNOSIS — Z3A37 37 weeks gestation of pregnancy: Secondary | ICD-10-CM

## 2018-07-14 DIAGNOSIS — O24414 Gestational diabetes mellitus in pregnancy, insulin controlled: Secondary | ICD-10-CM | POA: Insufficient documentation

## 2018-07-14 DIAGNOSIS — O99613 Diseases of the digestive system complicating pregnancy, third trimester: Secondary | ICD-10-CM | POA: Insufficient documentation

## 2018-07-14 DIAGNOSIS — F329 Major depressive disorder, single episode, unspecified: Secondary | ICD-10-CM | POA: Insufficient documentation

## 2018-07-14 DIAGNOSIS — O288 Other abnormal findings on antenatal screening of mother: Secondary | ICD-10-CM

## 2018-07-14 DIAGNOSIS — O99343 Other mental disorders complicating pregnancy, third trimester: Secondary | ICD-10-CM | POA: Insufficient documentation

## 2018-07-14 DIAGNOSIS — O09293 Supervision of pregnancy with other poor reproductive or obstetric history, third trimester: Secondary | ICD-10-CM

## 2018-07-14 DIAGNOSIS — O99340 Other mental disorders complicating pregnancy, unspecified trimester: Secondary | ICD-10-CM

## 2018-07-14 DIAGNOSIS — Z885 Allergy status to narcotic agent status: Secondary | ICD-10-CM | POA: Diagnosis not present

## 2018-07-14 DIAGNOSIS — O0993 Supervision of high risk pregnancy, unspecified, third trimester: Secondary | ICD-10-CM | POA: Diagnosis not present

## 2018-07-14 DIAGNOSIS — Z79899 Other long term (current) drug therapy: Secondary | ICD-10-CM | POA: Diagnosis not present

## 2018-07-14 DIAGNOSIS — Z79891 Long term (current) use of opiate analgesic: Secondary | ICD-10-CM | POA: Diagnosis not present

## 2018-07-14 DIAGNOSIS — Z8249 Family history of ischemic heart disease and other diseases of the circulatory system: Secondary | ICD-10-CM | POA: Insufficient documentation

## 2018-07-14 DIAGNOSIS — Z9049 Acquired absence of other specified parts of digestive tract: Secondary | ICD-10-CM | POA: Diagnosis not present

## 2018-07-14 DIAGNOSIS — M5431 Sciatica, right side: Secondary | ICD-10-CM

## 2018-07-14 DIAGNOSIS — Z888 Allergy status to other drugs, medicaments and biological substances status: Secondary | ICD-10-CM | POA: Insufficient documentation

## 2018-07-14 DIAGNOSIS — Z794 Long term (current) use of insulin: Secondary | ICD-10-CM | POA: Insufficient documentation

## 2018-07-14 DIAGNOSIS — Z7982 Long term (current) use of aspirin: Secondary | ICD-10-CM | POA: Diagnosis not present

## 2018-07-14 DIAGNOSIS — F419 Anxiety disorder, unspecified: Secondary | ICD-10-CM | POA: Diagnosis not present

## 2018-07-14 DIAGNOSIS — O26893 Other specified pregnancy related conditions, third trimester: Secondary | ICD-10-CM | POA: Diagnosis not present

## 2018-07-14 DIAGNOSIS — Z833 Family history of diabetes mellitus: Secondary | ICD-10-CM | POA: Diagnosis not present

## 2018-07-14 DIAGNOSIS — O09213 Supervision of pregnancy with history of pre-term labor, third trimester: Secondary | ICD-10-CM

## 2018-07-14 DIAGNOSIS — O09523 Supervision of elderly multigravida, third trimester: Secondary | ICD-10-CM | POA: Diagnosis not present

## 2018-07-14 LAB — OB RESULTS CONSOLE GC/CHLAMYDIA: Gonorrhea: NEGATIVE

## 2018-07-14 NOTE — Progress Notes (Signed)
Patient signed paper copy of AVS.  Verbalized understanding of dc instructions/fetal kick counts.  DC'd home in good condition.

## 2018-07-14 NOTE — MAU Provider Note (Signed)
History     CSN: [id number]  Arrival date and time: 07/14/18 1648  Chief Complaint  Patient presents with  . Pregnancy Ultrasound    BPP   [id number] _0 .0 wks sent from office for [place]. Reports good FM. No c/o. Her pregnancy is complicated by V2ZDG on Insulin, AMA, PTD x2, obesity.    OB History    Gravida  12   Para  9   Term  7   Preterm  2   AB  2   Living  9     SAB  2   TAB      Ectopic      Multiple  0   Live Births  9           Past Medical History:  Diagnosis Date  . Abnormal Pap smear    Colpo;Last pap 2012, ASCUS w/ negative HPV  . Anemia    during preg  . Anxiety   . ASCUS with positive high risk HPV   . Depression    ok now  . Eczema   . GERD (gastroesophageal reflux disease)   . Gestational diabetes    diet controlled  . H/O candidiasis   . H/O migraine   . H/O rubella   . H/O varicella   . H/O: depression   . H/O: obesity   . Headache(784.0)   . Hx: UTI (urinary tract infection)   . Miscarriage    3/13 and 7/13   . Opioid use   . Preterm labor   . Vaginal Pap smear, abnormal     Past Surgical History:  Procedure Laterality Date  . CHOLECYSTECTOMY  2008  . HERNIA REPAIR  2012  . HIATAL HERNIA REPAIR  2012  . WISDOM TOOTH EXTRACTION      Family History  Problem Relation Age of Onset  . Diabetes Maternal Grandmother   . Hypertension Maternal Grandmother   . Anemia Mother     Social History   Tobacco Use  . Smoking status: Never Smoker  . Smokeless tobacco: Never Used  Substance Use Topics  . Alcohol use: No  . Drug use: Yes    Types: Oxycodone    Comment: per pt prescribed by physician for chronic back pain    Allergies:  Allergies  Allergen Reactions  . Subutex [Buprenorphine] Itching  . Vicodin [Hydrocodone-Acetaminophen] Itching    Facility-Administered Medications Prior to Admission  Medication Dose Route Frequency Provider Last Rate Last Dose  . insulin starter kit- pen needles (English) 1 kit   1 kit Other Once Constant, Peggy, MD       Medications Prior to Admission  Medication Sig Dispense Refill Last Dose  . aspirin EC 81 MG tablet Take 1 tablet (81 mg total) by mouth daily. Take after 12 weeks for prevention of preeclampsia later in pregnancy 300 tablet 2 Taking  . clobetasol ointment (TEMOVATE) 0.05 % APPLY TOPICALLY TO THE AFFECTED AREA TWICE DAILY (Patient not taking: Reported on 07/14/2018) 80 g 1 Not Taking  . cyclobenzaprine (FLEXERIL) 10 MG tablet Take 10 mg by mouth 3 (three) times daily as needed for muscle spasms.   Taking  . FLUoxetine (PROZAC) 40 MG capsule Take 1 capsule (40 mg total) by mouth daily. 30 capsule 3 Taking  . gabapentin (NEURONTIN) 300 MG capsule TAKE ONE CAPSULE BY MOUTH THREE TIMES DAILY 90 capsule 2 Taking  . hydrOXYzine (VISTARIL) 50 MG capsule Take 1 capsule (50 mg total) by mouth every 6 (six) hours as  needed. (Patient not taking: Reported on 07/14/2018) 45 capsule 2 Not Taking  . insulin aspart (NOVOLOG) 100 UNIT/ML injection Inject 10 Units into the skin 3 (three) times daily with meals. 10 mL 11 Taking  . insulin NPH Human (HUMULIN N,NOVOLIN N) 100 UNIT/ML injection Inject 0.2 mLs (20 Units total) into the skin daily before breakfast. 10 mL 11 Taking  . insulin NPH Human (HUMULIN N,NOVOLIN N) 100 UNIT/ML injection Inject 0.1 mLs (10 Units total) into the skin every evening. 10 mL 11 Taking  . omeprazole (PRILOSEC) 20 MG capsule TAKE ONE CAPSULE BY MOUTH DAILY (Patient not taking: Reported on 07/08/2018) 30 capsule 0 Not Taking  . Oxycodone HCl 10 MG TABS Take 10 mg by mouth 3 (three) times daily as needed (pain).   0 Taking  . Prenatal-Fe Fum-Methf-FA w/o A (VITAFOL-NANO) 18-0.6-0.4 MG TABS Take 1 tablet by mouth daily. 30 tablet 12 Taking  . zolpidem (AMBIEN) 5 MG tablet Take 1 tablet (5 mg total) by mouth at bedtime as needed for sleep. 30 tablet 1 Taking    Review of Systems  Gastrointestinal: Negative for abdominal pain.  Genitourinary: Negative  for vaginal bleeding.   Physical Exam   Blood pressure 121/70, pulse (!) 127, temperature 98.3 F (36.8 C), temperature source Oral, resp. rate (!) 22, weight 128.3 kg, last menstrual period 10/22/2017, SpO2 96 %, unknown if currently breastfeeding.  Physical Exam  Constitutional: She is oriented to person, place, and time. She appears well-developed and well-nourished. No distress.  HENT:  Head: Normocephalic and atraumatic.  Neck: Normal range of motion.  Respiratory: Effort normal. No respiratory distress.  Musculoskeletal: Normal range of motion.  Neurological: She is alert and oriented to person, place, and time.  Skin: Skin is warm and dry.  Psychiatric: She has a normal mood and affect.  EFM: 145 bpm, mod variability, no accels, no decels Toco: none  No results found for this or any previous visit (from the past 24 hour(s)).  MAU Course  Procedures  MDM BPP 8/8, AFI 13cm. Stable for discharge home.  Assessment and Plan   1. [redacted] weeks gestation of pregnancy   2. NST (non-stress test) nonreactive   3. Sciatic nerve pain, right   4. GDM, class A2    Discharge home Follow up in OB office as scheduled FMCs  Allergies as of 07/14/2018      Reactions   Subutex [buprenorphine] Itching   Vicodin [hydrocodone-acetaminophen] Itching      Medication List    TAKE these medications   aspirin EC 81 MG tablet Take 1 tablet (81 mg total) by mouth daily. Take after 12 weeks for prevention of preeclampsia later in pregnancy   clobetasol ointment 0.05 % Commonly known as:  TEMOVATE APPLY TOPICALLY TO THE AFFECTED AREA TWICE DAILY   cyclobenzaprine 10 MG tablet Commonly known as:  FLEXERIL Take 10 mg by mouth 3 (three) times daily as needed for muscle spasms.   FLUoxetine 40 MG capsule Commonly known as:  PROZAC Take 1 capsule (40 mg total) by mouth daily.   gabapentin 300 MG capsule Commonly known as:  NEURONTIN TAKE ONE CAPSULE BY MOUTH THREE TIMES DAILY    hydrOXYzine 50 MG capsule Commonly known as:  VISTARIL Take 1 capsule (50 mg total) by mouth every 6 (six) hours as needed.   insulin aspart 100 UNIT/ML injection Commonly known as:  novoLOG Inject 10 Units into the skin 3 (three) times daily with meals.   insulin NPH Human 100 UNIT/ML  injection Commonly known as:  HUMULIN N,NOVOLIN N Inject 0.2 mLs (20 Units total) into the skin daily before breakfast.   insulin NPH Human 100 UNIT/ML injection Commonly known as:  HUMULIN N,NOVOLIN N Inject 0.1 mLs (10 Units total) into the skin every evening.   omeprazole 20 MG capsule Commonly known as:  PRILOSEC TAKE ONE CAPSULE BY MOUTH DAILY   Oxycodone HCl 10 MG Tabs Take 10 mg by mouth 3 (three) times daily as needed (pain).   VITAFOL-NANO 18-0.6-0.4 MG Tabs Take 1 tablet by mouth daily.   zolpidem 5 MG tablet Commonly known as:  AMBIEN Take 1 tablet (5 mg total) by mouth at bedtime as needed for sleep.      Julianne Handler, CNM 07/14/2018, 5:56 PM

## 2018-07-14 NOTE — Progress Notes (Signed)
Pt is G12P9 37w here for ROB with NST. GDM, pt states her BG levels have been good. Pt has complaints of sciatica pain. NST today.

## 2018-07-14 NOTE — Progress Notes (Signed)
   PRENATAL VISIT NOTE  Subjective:  Shelly Lane is a 42 y.o. [id number] at 6642w0d being seen today for ongoing prenatal care.  She is currently monitored for the following issues for this high-risk pregnancy and has OBESITY; ANXIETY; BULIMIA; Depression affecting pregnancy, antepartum; SYMPTOM, PAIN, ABDOMINAL, EPIGASTRIC; Elderly multigravida with antepartum condition or complication; SAB (spontaneous abortion) x 4; Grand multipara; Hx of preterm delivery x 2, currently pregnant; Hx gestational diabetes x 3; Multiple drug allergies; History of abnormal cervical Pap smear; Supervision of high-risk pregnancy; Chronic pain disorder; Drug abuse (HCC); DM (diabetes mellitus), type 2 (HCC); Sciatic nerve pain, right; and Gestational diabetes mellitus (GDM) in third trimester on their problem list.  Patient reports sciatic nerve pain which makes it difficult to ambulate.  Contractions: Irritability. Vag. Bleeding: None.  Movement: Present. Denies leaking of fluid.   The following portions of the patient's history were reviewed and updated as appropriate: allergies, current medications, past family history, past medical history, past social history, past surgical history and problem list. Problem list updated.  Objective:   Vitals:   07/14/18 1518  BP: 112/81  Pulse: (!) 121  Weight: 281 lb 14.4 oz (127.9 kg)    Fetal Status: Fetal Heart Rate (bpm): NST   Movement: Present  Presentation: Vertex  General:  Alert, oriented and cooperative. Patient is in no acute distress.  Skin: Skin is warm and dry. No rash noted.   Cardiovascular: Normal heart rate noted  Respiratory: Normal respiratory effort, no problems with respiration noted  Abdomen: Soft, gravid, appropriate for gestational age.  Pain/Pressure: Present     Pelvic: Cervical exam performed Dilation: 2 Effacement (%): Thick Station: Ballotable  Extremities: Normal range of motion.  Edema: Trace  Mental Status: Normal mood and affect.  Normal behavior. Normal judgment and thought content.   Assessment and Plan:  Pregnancy: [id number] at 5342w0d  1. Supervision of high risk pregnancy in third trimester Patient is doing well Encouraged patient to remain as active as she can despite the discomfort Continue the usage of the maternity support belt Cultures today - Culture, beta strep (group b only) - Cervicovaginal ancillary only  2. Insulin controlled gestational diabetes mellitus (GDM) in third trimester Patient restarted insulin as she is unable to cook and her daughter cannot cook healthy consistently CBGs reviewed and fasting within range. Most pp within range with highest value 140 8/2 EFW 6lb14oz (82%tile) NST reviewed and non reactive with baseline 140, mod variability, + accels 10 x 10, no decels. Patient sent for stat BPP Will schedule IOL at 39 weeks  3. Depression affecting pregnancy, antepartum Continue zoloft  Term labor symptoms and general obstetric precautions including but not limited to vaginal bleeding, contractions, leaking of fluid and fetal movement were reviewed in detail with the patient. Please refer to After Visit Summary for other counseling recommendations.  Return in about 1 week (around 07/21/2018) for ROB.  No future appointments.  Catalina AntiguaPeggy Braylin Xu, MD

## 2018-07-14 NOTE — MAU Note (Signed)
Patient states she failed her NST in the office and was sent over to MAU for a BPP.  Reports good fetal movement and denies LOF or vaginal bleeding.  She says they stripped her membranes today.  C/o sciatic pain since 5months pregnant.

## 2018-07-14 NOTE — Discharge Instructions (Signed)

## 2018-07-14 NOTE — MAU Note (Signed)
Urine in lab 

## 2018-07-15 ENCOUNTER — Other Ambulatory Visit: Payer: Self-pay | Admitting: Obstetrics and Gynecology

## 2018-07-15 DIAGNOSIS — G8929 Other chronic pain: Secondary | ICD-10-CM

## 2018-07-15 DIAGNOSIS — M546 Pain in thoracic spine: Principal | ICD-10-CM

## 2018-07-15 LAB — CERVICOVAGINAL ANCILLARY ONLY
Chlamydia: NEGATIVE
Neisseria Gonorrhea: NEGATIVE

## 2018-07-18 ENCOUNTER — Inpatient Hospital Stay (HOSPITAL_COMMUNITY)
Admission: AD | Admit: 2018-07-18 | Discharge: 2018-07-20 | DRG: 807 | Disposition: A | Discharge status: Home / Self Care | Payer: Medicaid Other | Attending: Family Medicine | Admitting: Family Medicine

## 2018-07-18 ENCOUNTER — Other Ambulatory Visit: Payer: Self-pay

## 2018-07-18 ENCOUNTER — Inpatient Hospital Stay (HOSPITAL_COMMUNITY): Payer: Medicaid Other | Admitting: Anesthesiology

## 2018-07-18 ENCOUNTER — Encounter (HOSPITAL_COMMUNITY): Payer: Self-pay | Admitting: *Deleted

## 2018-07-18 DIAGNOSIS — Z3A37 37 weeks gestation of pregnancy: Secondary | ICD-10-CM

## 2018-07-18 DIAGNOSIS — O99344 Other mental disorders complicating childbirth: Secondary | ICD-10-CM | POA: Diagnosis present

## 2018-07-18 DIAGNOSIS — F329 Major depressive disorder, single episode, unspecified: Secondary | ICD-10-CM | POA: Diagnosis present

## 2018-07-18 DIAGNOSIS — Z641 Problems related to multiparity: Secondary | ICD-10-CM

## 2018-07-18 DIAGNOSIS — M5431 Sciatica, right side: Secondary | ICD-10-CM

## 2018-07-18 DIAGNOSIS — Z8759 Personal history of other complications of pregnancy, childbirth and the puerperium: Secondary | ICD-10-CM

## 2018-07-18 DIAGNOSIS — Z794 Long term (current) use of insulin: Secondary | ICD-10-CM

## 2018-07-18 DIAGNOSIS — E669 Obesity, unspecified: Secondary | ICD-10-CM | POA: Diagnosis present

## 2018-07-18 DIAGNOSIS — O2412 Pre-existing diabetes mellitus, type 2, in childbirth: Principal | ICD-10-CM | POA: Diagnosis present

## 2018-07-18 DIAGNOSIS — E119 Type 2 diabetes mellitus without complications: Secondary | ICD-10-CM | POA: Diagnosis present

## 2018-07-18 DIAGNOSIS — O99214 Obesity complicating childbirth: Secondary | ICD-10-CM | POA: Diagnosis present

## 2018-07-18 DIAGNOSIS — Z8632 Personal history of gestational diabetes: Secondary | ICD-10-CM | POA: Diagnosis present

## 2018-07-18 DIAGNOSIS — Z8659 Personal history of other mental and behavioral disorders: Secondary | ICD-10-CM

## 2018-07-18 DIAGNOSIS — Z3490 Encounter for supervision of normal pregnancy, unspecified, unspecified trimester: Secondary | ICD-10-CM

## 2018-07-18 DIAGNOSIS — Z3483 Encounter for supervision of other normal pregnancy, third trimester: Secondary | ICD-10-CM | POA: Diagnosis present

## 2018-07-18 LAB — GLUCOSE, CAPILLARY
Glucose-Capillary: 127 mg/dL — ABNORMAL HIGH (ref 70–99)
Glucose-Capillary: 114 mg/dL — ABNORMAL HIGH (ref 70–99)
Glucose-Capillary: 82 mg/dL (ref 70–99)
Glucose-Capillary: 80 mg/dL (ref 70–99)

## 2018-07-18 LAB — CBC
HCT: 37.5 % (ref 36.0–46.0)
Hemoglobin: 12.4 g/dL (ref 12.0–15.0)
MCH: 29.2 pg (ref 26.0–34.0)
MCHC: 33.1 g/dL (ref 30.0–36.0)
MCV: 88.2 fL (ref 78.0–100.0)
Platelets: 216 10*3/uL (ref 150–400)
RBC: 4.25 MIL/uL (ref 3.87–5.11)
RDW: 15.8 % — ABNORMAL HIGH (ref 11.5–15.5)
WBC: 10.1 10*3/uL (ref 4.0–10.5)

## 2018-07-18 LAB — GROUP B STREP BY PCR: Group B strep by PCR: NEGATIVE

## 2018-07-18 LAB — RPR: RPR Ser Ql: NONREACTIVE

## 2018-07-18 LAB — TYPE AND SCREEN
ABO/RH(D): O POS
Antibody Screen: NEGATIVE

## 2018-07-18 LAB — CULTURE, BETA STREP (GROUP B ONLY): Strep Gp B Culture: NEGATIVE

## 2018-07-18 MED ORDER — OXYCODONE-ACETAMINOPHEN 5-325 MG PO TABS: 1.0000 | ORAL_TABLET | ORAL | Status: DC | PRN

## 2018-07-18 MED ORDER — OXYTOCIN BOLUS FROM INFUSION
500.0000 mL | Freq: Once | INTRAVENOUS | Status: AC
  Administered 2018-07-18: 500 mL via INTRAVENOUS

## 2018-07-18 MED ORDER — DIPHENHYDRAMINE HCL 50 MG/ML IJ SOLN: 12.5000 mg | INTRAMUSCULAR | Status: DC | PRN

## 2018-07-18 MED ORDER — FERROUS SULFATE 325 (65 FE) MG PO TABS
325.0000 mg | ORAL_TABLET | Freq: Two times a day (BID) | ORAL | Status: DC
  Administered 2018-07-18 – 2018-07-20 (×5): 325 mg via ORAL
  Filled 2018-07-18 (×5): qty 1

## 2018-07-18 MED ORDER — ACETAMINOPHEN 325 MG PO TABS: 650.0000 mg | ORAL_TABLET | ORAL | Status: DC | PRN

## 2018-07-18 MED ORDER — FENTANYL 2.5 MCG/ML BUPIVACAINE 1/10 % EPIDURAL INFUSION (WH - ANES)
14.0000 mL/h | INTRAMUSCULAR | Status: DC | PRN
  Administered 2018-07-18: 14 mL/h via EPIDURAL
  Filled 2018-07-18: qty 100

## 2018-07-18 MED ORDER — COCONUT OIL OIL: 1.0000 "application " | TOPICAL_OIL | Status: DC | PRN

## 2018-07-18 MED ORDER — IBUPROFEN 600 MG PO TABS
600.0000 mg | ORAL_TABLET | Freq: Four times a day (QID) | ORAL | Status: DC
  Administered 2018-07-18 – 2018-07-20 (×10): 600 mg via ORAL
  Filled 2018-07-18 (×10): qty 1

## 2018-07-18 MED ORDER — EPHEDRINE 5 MG/ML INJ
10.0000 mg | INTRAVENOUS | Status: DC | PRN
  Filled 2018-07-18: qty 2

## 2018-07-18 MED ORDER — ONDANSETRON HCL 4 MG/2ML IJ SOLN: 4.0000 mg | Freq: Four times a day (QID) | INTRAMUSCULAR | Status: DC | PRN

## 2018-07-18 MED ORDER — BENZOCAINE-MENTHOL 20-0.5 % EX AERO
1.0000 "application " | INHALATION_SPRAY | CUTANEOUS | Status: DC | PRN
  Administered 2018-07-20: 1 via TOPICAL
  Filled 2018-07-18: qty 56

## 2018-07-18 MED ORDER — PHENYLEPHRINE 40 MCG/ML (10ML) SYRINGE FOR IV PUSH (FOR BLOOD PRESSURE SUPPORT)
80.0000 ug | PREFILLED_SYRINGE | INTRAVENOUS | Status: DC | PRN
  Filled 2018-07-18: qty 5

## 2018-07-18 MED ORDER — LIDOCAINE HCL (PF) 1 % IJ SOLN
30.0000 mL | INTRAMUSCULAR | Status: DC | PRN
  Filled 2018-07-18: qty 30

## 2018-07-18 MED ORDER — ONDANSETRON HCL 4 MG/2ML IJ SOLN: 4.0000 mg | INTRAMUSCULAR | Status: DC | PRN

## 2018-07-18 MED ORDER — OXYTOCIN 40 UNITS IN LACTATED RINGERS INFUSION - SIMPLE MED
2.5000 [IU]/h | INTRAVENOUS | Status: DC
  Filled 2018-07-18: qty 1000

## 2018-07-18 MED ORDER — LACTATED RINGERS IV SOLN: 500.0000 mL | Freq: Once | INTRAVENOUS | Status: DC

## 2018-07-18 MED ORDER — MEASLES, MUMPS & RUBELLA VAC ~~LOC~~ INJ
0.5000 mL | INJECTION | Freq: Once | SUBCUTANEOUS | Status: DC
  Filled 2018-07-18: qty 0.5

## 2018-07-18 MED ORDER — LIDOCAINE HCL (PF) 1 % IJ SOLN
INTRAMUSCULAR | Status: DC | PRN
  Administered 2018-07-18 (×2): 4 mL via EPIDURAL

## 2018-07-18 MED ORDER — SIMETHICONE 80 MG PO CHEW
80.0000 mg | CHEWABLE_TABLET | ORAL | Status: DC | PRN
  Administered 2018-07-19 (×2): 80 mg via ORAL
  Filled 2018-07-18 (×2): qty 1

## 2018-07-18 MED ORDER — MAGNESIUM HYDROXIDE 400 MG/5ML PO SUSP
30.0000 mL | ORAL | Status: DC | PRN
Start: 2018-07-18 — End: 2018-07-20

## 2018-07-18 MED ORDER — WITCH HAZEL-GLYCERIN EX PADS: 1.0000 "application " | MEDICATED_PAD | CUTANEOUS | Status: DC | PRN

## 2018-07-18 MED ORDER — ONDANSETRON HCL 4 MG PO TABS: 4.0000 mg | ORAL_TABLET | ORAL | Status: DC | PRN

## 2018-07-18 MED ORDER — TETANUS-DIPHTH-ACELL PERTUSSIS 5-2.5-18.5 LF-MCG/0.5 IM SUSP: 0.5000 mL | Freq: Once | INTRAMUSCULAR | Status: DC

## 2018-07-18 MED ORDER — DIPHENHYDRAMINE HCL 25 MG PO CAPS: 25.0000 mg | ORAL_CAPSULE | Freq: Four times a day (QID) | ORAL | Status: DC | PRN

## 2018-07-18 MED ORDER — PRENATAL MULTIVITAMIN CH
1.0000 | ORAL_TABLET | Freq: Every day | ORAL | Status: DC
  Administered 2018-07-18 – 2018-07-20 (×3): 1 via ORAL
  Filled 2018-07-18 (×3): qty 1

## 2018-07-18 MED ORDER — OXYCODONE-ACETAMINOPHEN 5-325 MG PO TABS
2.0000 | ORAL_TABLET | ORAL | Status: DC | PRN
  Administered 2018-07-18 – 2018-07-20 (×9): 2 via ORAL
  Filled 2018-07-18 (×9): qty 2

## 2018-07-18 MED ORDER — SOD CITRATE-CITRIC ACID 500-334 MG/5ML PO SOLN: 30.0000 mL | ORAL | Status: DC | PRN

## 2018-07-18 MED ORDER — METFORMIN HCL 500 MG PO TABS
500.0000 mg | ORAL_TABLET | Freq: Two times a day (BID) | ORAL | Status: DC
  Administered 2018-07-18 – 2018-07-20 (×4): 500 mg via ORAL
  Filled 2018-07-18 (×6): qty 1

## 2018-07-18 MED ORDER — DIBUCAINE 1 % RE OINT: 1.0000 "application " | TOPICAL_OINTMENT | RECTAL | Status: DC | PRN

## 2018-07-18 MED ORDER — LACTATED RINGERS IV SOLN
INTRAVENOUS | Status: DC
  Administered 2018-07-18: 01:00:00 via INTRAVENOUS

## 2018-07-18 MED ORDER — PHENYLEPHRINE 40 MCG/ML (10ML) SYRINGE FOR IV PUSH (FOR BLOOD PRESSURE SUPPORT)
80.0000 ug | PREFILLED_SYRINGE | INTRAVENOUS | Status: DC | PRN
  Filled 2018-07-18: qty 10
  Filled 2018-07-18: qty 5

## 2018-07-18 MED ORDER — LACTATED RINGERS IV SOLN
500.0000 mL | INTRAVENOUS | Status: DC | PRN
  Administered 2018-07-18: 500 mL via INTRAVENOUS

## 2018-07-18 NOTE — H&P (Signed)
Shelly Lane is a 42 y.o. female presenting for SROM at 0005 and spontaneous labor. She received prenatal care at [facility]. Pregnancy is complicated by has OBESITY; ANXIETY; BULIMIA; Depression affecting pregnancy, antepartum; SYMPTOM, PAIN, ABDOMINAL, EPIGASTRIC; Elderly multigravida with antepartum condition or complication; SAB (spontaneous abortion) x 4; Grand multipara; Hx of preterm delivery x 2, currently pregnant; Hx gestational diabetes x 3; Multiple drug allergies; History of abnormal cervical Pap smear; Supervision of high-risk pregnancy; Chronic pain disorder; Drug abuse (HCC); DM (diabetes mellitus), type 2 (HCC); Sciatic nerve pain, right; Gestational diabetes mellitus (GDM) in third trimester; and Pregnancy on their problem list.  OB History    Gravida  12   Para  9   Term  7   Preterm  2   AB  2   Living  9     SAB  2   TAB      Ectopic      Multiple  0   Live Births  9          Past Medical History:  Diagnosis Date  . Abnormal Pap smear    Colpo;Last pap 2012, ASCUS w/ negative HPV  . Anemia    during preg  . Anxiety   . ASCUS with positive high risk HPV   . Depression    ok now  . Eczema   . GERD (gastroesophageal reflux disease)   . Gestational diabetes    diet controlled  . H/O candidiasis   . H/O migraine   . H/O rubella   . H/O varicella   . H/O: depression   . H/O: obesity   . Headache(784.0)   . Hx: UTI (urinary tract infection)   . Miscarriage    3/13 and 7/13   . Opioid use   . Preterm labor   . Vaginal Pap smear, abnormal    Past Surgical History:  Procedure Laterality Date  . CHOLECYSTECTOMY  2008  . CHOLECYSTECTOMY  2008  . HERNIA REPAIR  2012  . HIATAL HERNIA REPAIR  2012  . WISDOM TOOTH EXTRACTION     Family History: family history includes Anemia in her mother; Diabetes in her maternal grandmother; Hypertension in her maternal grandmother. Social History:  reports that she has never smoked. She has never used  smokeless tobacco. She reports that she has current or past drug history. Drug: Oxycodone. She reports that she does not drink alcohol.     Maternal Diabetes: Yes:  Diabetes Type:  Insulin/Medication controlled Genetic Screening: Normal Maternal Ultrasounds/Referrals: Normal Fetal Ultrasounds or other Referrals:  None Maternal Substance Abuse:  Yes:  Type: Marijuana Significant Maternal Medications:  Meds include: Other:  Novolog and NPH Significant Maternal Lab Results:  None Other Comments:  None  Review of Systems  Respiratory: Negative for shortness of breath.   Gastrointestinal: Positive for abdominal pain.  All other systems reviewed and are negative.  History Dilation: 4 Effacement (%): 80 Station: -2 Exam by:: Paulette Fox RN Blood pressure 131/80, pulse (!) 113, temperature 97.8 F (36.6 C), temperature source Oral, resp. rate 18, weight 129.3 kg, last menstrual period 10/22/2017, unknown if currently breastfeeding. Exam Physical Exam  Nursing note and vitals reviewed. Constitutional: She is oriented to person, place, and time. She appears well-developed and well-nourished.  Cardiovascular: Normal rate, regular rhythm, normal heart sounds and intact distal pulses.  Respiratory: Effort normal and breath sounds normal.  GI:  Gravid  Genitourinary: Vagina normal and uterus normal.  Musculoskeletal: Normal range of motion.  Neurological: She is alert and oriented to person, place, and time. She has normal reflexes.  Skin: Skin is warm and dry.  Psychiatric: She has a normal mood and affect. Her behavior is normal. Judgment and thought content normal.    Prenatal labs: ABO, Rh: --/--/O POS (08/12 0110) Antibody: PENDING (08/12 0110) Rubella: 2.32 (02/07 1656) RPR: Non Reactive (06/03 1702)  HBsAg: Negative (02/07 1656)  HIV: Non Reactive (06/03 1702)  GBS:   Pending, Rapid GBS by PCR ordered  Assessment/Plan: --42 y.o. [id number] at 7752w4d  --SROM 0005 today,  moderate amount of clear fluid --Reactive NST: baseline 150, moderate variability, positive accelerations, occasional delerations --q 2 hour CBGs --Boy/breast/outpatient circ --Patient plans to use nitrous oxide for comfort management in labor  Calvert CantorSamantha C Weinhold, CNM 07/18/2018, 2:19 AM

## 2018-07-18 NOTE — MAU Note (Signed)
Pt reports SROM since 0005. Pt also having uc's. Was 3 cm last time she was checked. Taking insulin for GDM, took her blood sugar last this morning when she woke up. Had grape juice just prior to coming to the hospital.

## 2018-07-18 NOTE — Anesthesia Preprocedure Evaluation (Signed)
Anesthesia Evaluation  Patient identified by MRN, date of birth, ID band Patient awake    Reviewed: Allergy & Precautions, Patient's Chart, lab work & pertinent test results  Airway Mallampati: III  TM Distance: >3 FB Neck ROM: Full    Dental  (+) Poor Dentition   Pulmonary neg pulmonary ROS,    Pulmonary exam normal breath sounds clear to auscultation       Cardiovascular negative cardio ROS   Rhythm:Regular Rate:Normal     Neuro/Psych  Headaches, PSYCHIATRIC DISORDERS Anxiety Depression  Neuromuscular disease    GI/Hepatic GERD  Medicated and Controlled,  Endo/Other  diabetes, Well Controlled, Gestational, Insulin DependentMorbid obesity  Renal/GU   negative genitourinary   Musculoskeletal Back pain with sciatica   Abdominal (+) + obese,   Peds  Hematology  (+) anemia ,   Anesthesia Other Findings   Reproductive/Obstetrics (+) Pregnancy Grand multipara AMA                             Anesthesia Physical Anesthesia Plan  ASA: III  Anesthesia Plan: Epidural   Post-op Pain Management:    Induction:   PONV Risk Score and Plan:   Airway Management Planned: Natural Airway  Additional Equipment:   Intra-op Plan:   Post-operative Plan:   Informed Consent: I have reviewed the patients History and Physical, chart, labs and discussed the procedure including the risks, benefits and alternatives for the proposed anesthesia with the patient or authorized representative who has indicated his/her understanding and acceptance.     Plan Discussed with: Anesthesiologist  Anesthesia Plan Comments:         Anesthesia Quick Evaluation

## 2018-07-18 NOTE — Anesthesia Postprocedure Evaluation (Signed)
Anesthesia Post Note  Patient: Shelly Lane  Procedure(s) Performed: AN AD HOC LABOR EPIDURAL  Patient location during evaluation: Mother Baby Anesthesia Type: Epidural Level of consciousness: awake and alert and patient cooperative Pain management: satisfactory to patient Vital Signs Assessment: post-procedure vital signs reviewed and stable Respiratory status: spontaneous breathing Cardiovascular status: stable Postop Assessment: able to ambulate Anesthetic complications: no     Last Vitals:  Vitals:   07/18/18 0612 07/18/18 0806  BP: 132/69 124/71  Pulse: 90 (!) 102  Resp: 18 18  Temp: 36.7 C 36.4 C  SpO2:      Last Pain:  Vitals:   07/18/18 1200  TempSrc:   PainSc: 4                  Jodie Cavey

## 2018-07-18 NOTE — Lactation Note (Signed)
This note was copied from a baby's chart. Lactation Consultation Note  Patient Name: Shelly Westley GamblesLinda Carbonell ZOXWR'UToday's Date: 07/18/2018 Reason for consult: Initial assessment;Early term 37-38.6wks   Initial assessment with Exp BF mom of early term 7 hour old infant. Infant with 3 BF for 15- 60 + minutes, and 1 void since birth. Infant weigh 6 pounds 14.4 ounces. Mom was just returning infant to the crib post feeding.   Mom reports infant fed well after birth and was on and off the breast for 3-4 hours then slept for a while. Mom asked for DEBP to be set up. She reports she has had some milk supply issues with a few of her children who had "short tongues" . DEBP was set up. Mom was shown how to assemble, disassemble and clean pump parts. Mom is to pump post BF and then offer infant any collected colostrum via spoon. Mom was given spoons. Mom is able to hand express colostrum. Reviewed what to expect with pumping and milk coming to volume.   Enc mom to feed infant STS 8-12 x in 24 hours at first feeding cues. Enc mom to use the cross cradle hold with good head and pillow support during feeding. Extra pillows obtained for mom at her request. Enc mom to hand express before and after latch and post pumping.   Reviewed with mom that infant is an early term infant and may get sleepy at the breast. Enc mom to feed STS, stimulate infant as needed and to massage/compress breast with feeding.   BF Resources handout and LC Brochure given, mom informed of IP/OP Services, BF Support Groups and LC phone #. Enc mom to call out for feeding assistance as needed. Mom reports she has no questions/concerns as needed.      Maternal Data Formula Feeding for Exclusion: No Has patient been taught Hand Expression?: Yes Does the patient have breastfeeding experience prior to this delivery?: Yes  Feeding Feeding Type: Breast Fed Length of feed: 30 min  LATCH Score                    Interventions Interventions: Breast feeding basics reviewed;Support pillows;Position options;Skin to skin;Breast compression;Hand express;Breast massage;DEBP  Lactation Tools Discussed/Used WIC Program: Yes Pump Review: Setup, frequency, and cleaning;Milk Storage Initiated by:: patient request and ET infant  Date initiated:: 07/18/18   Consult Status Consult Status: Follow-up Date: 07/19/18 Follow-up type: In-patient    Silas FloodSharon S Luz Mares 07/18/2018, 12:41 PM

## 2018-07-18 NOTE — Anesthesia Procedure Notes (Addendum)
Epidural Patient location during procedure: OB Start time: 07/18/2018 3:15 AM End time: 07/18/2018 3:24 AM  Staffing Anesthesiologist: Mal AmabileFoster, Kerensa Nicklas, MD Performed: anesthesiologist   Preanesthetic Checklist Completed: patient identified, site marked, surgical consent, pre-op evaluation, timeout performed, IV checked, risks and benefits discussed and monitors and equipment checked  Epidural Patient position: sitting Prep: site prepped and draped and DuraPrep Patient monitoring: continuous pulse ox and blood pressure Approach: midline Location: L3-L4 Injection technique: LOR air  Needle:  Needle type: Tuohy  Needle gauge: 17 G Needle length: 9 cm and 9 Needle insertion depth: 7 cm Catheter type: closed end flexible Catheter size: 19 Gauge Catheter at skin depth: 12 cm Test dose: negative and Other  Assessment Events: blood not aspirated, injection not painful, no injection resistance, negative IV test and no paresthesia  Additional Notes Patient identified. Risks and benefits discussed including failed block, incomplete  Pain control, post dural puncture headache, nerve damage, paralysis, blood pressure Changes, nausea, vomiting, reactions to medications-both toxic and allergic and post Partum back pain. All questions were answered. Patient expressed understanding and wished to proceed. Sterile technique was used throughout procedure. Epidural site was Dressed with sterile barrier dressing. No paresthesias, signs of intravascular injection Or signs of intrathecal spread were encountered.  Patient was more comfortable after the epidural was dosed. Please see RN's note for documentation of vital signs and FHR which are stable.

## 2018-07-19 LAB — GLUCOSE, CAPILLARY
Glucose-Capillary: 138 mg/dL — ABNORMAL HIGH (ref 70–99)
Glucose-Capillary: 73 mg/dL (ref 70–99)
Glucose-Capillary: 105 mg/dL — ABNORMAL HIGH (ref 70–99)

## 2018-07-19 NOTE — Progress Notes (Signed)
Post Partum Day 1 Subjective: Doing well. States having cramping, especially with breastfeeding. Thinks medicine is helping.   Objective: Blood pressure 107/60, pulse 97, temperature 98.5 F (36.9 C), temperature source Oral, resp. rate 18, height 5' 6.25" (1.683 m), weight 117 kg, last menstrual period 10/22/2017, SpO2 99 %, unknown if currently breastfeeding.  Physical Exam:  General: alert and lying in bed, NAD Lochia: appropriate Uterine Fundus: firm, below umbilicus Incision: n/a DVT Evaluation: No evidence of DVT seen on physical exam.  Recent Labs    07/18/18 0111  HGB 12.4  HCT 37.5    Assessment/Plan: Plan for discharge tomorrow and Breastfeeding.  Type II Diabetes - Diagnosed by early 2-hour, A1C early in pregnancy 5.2%. Was on insulin in pregnancy. Unclear from notes plan for medication management postpartum but will start on metformin and plan to discharge home with same.  -- repeat FBG in AM given BG today after meal -- metformin 500mg  BID  -- will consider repeat 2-hr GTT, A1C 6-8 weeks postpartum   Depression - On zoloft during pregnancy but no longer on home meds. -- discuss continuation of zoloft prior to d/c    LOS: 1 day   Avanell Banwart S Jamen Loiseau 07/19/2018, 4:13 PM

## 2018-07-19 NOTE — Progress Notes (Signed)
MOB was referred for history of depression/anxiety. * Referral screened out by Clinical Social Worker because none of the following criteria appear to apply: ~ History of anxiety/depression during this pregnancy, or of post-partum depression following prior delivery. ~ Diagnosis of anxiety and/or depression within last 3 years OR * MOB's symptoms currently being treated with medication and/or therapy. Please contact the Clinical Social Worker if needs arise, by MOB request, or if MOB scores greater than 9/yes to question 10 on Edinburgh Postpartum Depression Screen.  Shelly Lane, MSW, LCSW Clinical Social Work [phone]  

## 2018-07-19 NOTE — Addendum Note (Signed)
Addendum  created 07/19/18 1146 by Franco NonesYates, Nykole Matos S, CRNA   Charge Capture section accepted

## 2018-07-19 NOTE — Lactation Note (Signed)
This note was copied from a baby's chart. Lactation Consultation Note  Patient Name: Boy Westley GamblesLinda Hooley Today's Date: 07/19/2018   P10, Baby 34 hours old.  Ex BF 1-2 years.  Baby unlatched upon entering. Mother states she worries if he can accommodate her large nipple. Demonstrated how to perform chin tug to wide latch.  Mother side lying.  Assisted w/ re-latching for more depth. Intermittent swallows observed.  Mother complaining of nipple soreness.  She has personal lanolin nipple cream. Reviewed hand expression with drops expressed and reviewed how to compress breast for more depth. Mom encouraged to feed baby 8-12 times/24 hours and with feeding cues.  Mom made aware of O/P services, breastfeeding support groups, community resources, and our phone # for post-discharge questions.       Maternal Data    Feeding    LATCH Score                   Interventions    Lactation Tools Discussed/Used     Consult Status      Dahlia ByesBerkelhammer, Avah Bashor [facility] 07/19/2018, 3:04 PM

## 2018-07-19 NOTE — Progress Notes (Signed)
Patient did not call for blood sugar check before eating; Patient states she did not eat breakfast and was finishing eating when RN walked into the room at 1230.  Blood sugar checked at that time and was 138 but patient had just finished meal.

## 2018-07-20 DIAGNOSIS — Z8759 Personal history of other complications of pregnancy, childbirth and the puerperium: Secondary | ICD-10-CM

## 2018-07-20 DIAGNOSIS — Z8659 Personal history of other mental and behavioral disorders: Secondary | ICD-10-CM

## 2018-07-20 LAB — GLUCOSE, CAPILLARY: Glucose-Capillary: 101 mg/dL — ABNORMAL HIGH (ref 70–99)

## 2018-07-20 MED ORDER — FLUOXETINE HCL 40 MG PO CAPS: 40.0000 mg | ORAL_CAPSULE | Freq: Every day | ORAL | 3 refills | Status: DC

## 2018-07-20 MED ORDER — IBUPROFEN 600 MG PO TABS: 600.0000 mg | ORAL_TABLET | Freq: Four times a day (QID) | ORAL | 0 refills | Status: DC | PRN

## 2018-07-20 MED ORDER — METFORMIN HCL 500 MG PO TABS: 500.0000 mg | ORAL_TABLET | Freq: Two times a day (BID) | ORAL | 3 refills | Status: DC

## 2018-07-20 NOTE — Lactation Note (Signed)
This note was copied from a baby's chart. Lactation Consultation Note  Patient Name: Boy Westley GamblesLinda Rezek [id number]'s Date: 07/20/2018 Reason for consult: Follow-up assessment;Infant weight loss  Visited with P10 Mom of ET baby at 3055 hrs old.  Baby at 8% weight loss, output 4 voids and 3 stools.   Mom states baby has been cluster feeding this morning.  Baby latched on in laid back cradle hold.  Latch to breast wide, and baby noted to be vigorous with regular swallowing.   Hand expression reviewed, colostrum easily expressed.  Hand pump given and demonstrated use, milk expressed easily. Baby noted to be wearing a t shirt.  Encouraged STS and feeding baby often on cue.  Goal is 8-12 feedings per 24 hrs. Engorgement prevention and treatment discussed.  Mom aware of OP Lactation services. Encouraged to call prn.\  Consult Status Consult Status: Complete Date: 07/20/18 Follow-up type: Call as needed    Judee ClaraSmith, Mishaal Lansdale E 07/20/2018, 11:40 AM

## 2018-07-20 NOTE — Lactation Note (Signed)
This note was copied from a baby's chart. Lactation Consultation Note Mom called out for assistance d/t baby not feeding well. Mom gave formula d/t she felt that the baby wasn't getting anything from her breast. Mom also wants to be show how to work her personal DEBP. LC discussed w/mom she could be shown before d/c home, need to work on getting baby to feed first. Asked mom to call LC for next feeding.  Patient Name: Shelly Westley GamblesLinda Lane Today's Date: 07/20/2018     Maternal Data    Feeding Feeding Type: Breast Fed Length of feed: 10 min  LATCH Score Latch: Repeated attempts needed to sustain latch, nipple held in mouth throughout feeding, stimulation needed to elicit sucking reflex.  Audible Swallowing: Spontaneous and intermittent  Type of Nipple: Everted at rest and after stimulation  Comfort (Breast/Nipple): Soft / non-tender  Hold (Positioning): Assistance needed to correctly position infant at breast and maintain latch.  LATCH Score: 8  Interventions    Lactation Tools Discussed/Used     Consult Status      Shelly Lane 07/20/2018, 1:40 AM

## 2018-07-20 NOTE — Progress Notes (Signed)
CSW assessed for SA. MOB denied any use of illicit substance and reported pain medication being managed with [facility].   There are no barriers to discharged.  CSW will monitor CDS and will make a report to [place] CPS if warranted.    Shelly Lane, MSW, LCSW Clinical Social Work [phone] 

## 2018-07-20 NOTE — Discharge Summary (Addendum)
OB Discharge Summary     Patient Name: Shelly Lane DOB: 30-May-1976 MRN: [id number]  Date of admission: 07/18/2018 Delivering MD: Calvert CantorWEINHOLD, SAMANTHA C   Date of discharge: 07/20/2018  Admitting diagnosis: 37.4 WEEKS CTX ROM Intrauterine pregnancy: 942w4d     Secondary diagnosis:  Active Problems:   OBESITY   Grand multipara   Hx gestational diabetes x 3   SVD (spontaneous vaginal delivery)   DM (diabetes mellitus), type 2 (HCC)   Pregnancy   History of postpartum depression     Discharge diagnosis: Term Pregnancy Delivered                                                                                                Post partum procedures:None  Augmentation: None  Complications: None  Hospital course:  Onset of Labor With Vaginal Delivery     42 y.o. yo [id number] at 1742w4d was admitted in Latent Labor on 07/18/2018. Patient had an uncomplicated labor course as follows:  Membrane Rupture Time/Date: 12:05 AM ,07/18/2018   Intrapartum Procedures: Episiotomy: None [1]                                         Lacerations:  1st degree [2];Perineal [11]  Patient had a delivery of a Viable infant. 07/18/2018  Information for the patient's newborn:  Shelly Lane [[id number]][[id number]]  Delivery Method: Vag-Spont    Pateint had an uncomplicated postpartum course.  She is ambulating, tolerating a regular diet, passing flatus, and urinating well. Patient is discharged home in stable condition on 07/20/18. The patient was started on metformin for her pre-existing diabetes. She was also continued on prozac for her history of postpartum depression, which is safe for breastfeeding.    Physical exam  Vitals:   07/18/18 2334 07/19/18 0529 07/19/18 1446 07/19/18 2335  BP: 120/71 101/63 107/60 135/82  Pulse: 99 99 97 (!) 107  Resp: 18 18 18 17   Temp: 98.2 F (36.8 C) (!) 97.4 F (36.3 C) 98.5 F (36.9 C) 98.5 F (36.9 C)  TempSrc: Axillary Oral Oral Oral  SpO2: 99% 99% 99%    Weight:      Height:       General: alert, cooperative and no distress Lochia: appropriate Uterine Fundus: firm DVT Evaluation: No evidence of DVT seen on physical exam. Labs: Lab Results  Component Value Date   WBC 10.1 07/18/2018   HGB 12.4 07/18/2018   HCT 37.5 07/18/2018   MCV 88.2 07/18/2018   PLT 216 07/18/2018   CMP Latest Ref Rng & Units 07/01/2018  Glucose 70 - 99 mg/dL 08(M46(L)  BUN 6 - 20 mg/dL <5(H<5(L)  Creatinine 8.460.44 - 1.00 mg/dL 9.62(X0.34(L)  Sodium 528135 - 413145 mmol/L 136  Potassium 3.5 - 5.1 mmol/L 3.2(L)  Chloride 98 - 111 mmol/L 107  CO2 22 - 32 mmol/L 20(L)  Calcium 8.9 - 10.3 mg/dL 2.4(M8.5(L)  Total Protein 6.5 - 8.1 g/dL 0.1(U5.7(L)  Total Bilirubin 0.3 - 1.2 mg/dL 0.5  Alkaline Phos  38 - 126 U/L 83  AST 15 - 41 U/L 19  ALT 0 - 44 U/L 15    Discharge instruction: per After Visit Summary and "Baby and Me Booklet".  After visit meds:  Allergies as of 07/20/2018      Reactions   Subutex [buprenorphine] Itching   Vicodin [hydrocodone-acetaminophen] Itching      Medication List    STOP taking these medications   aspirin EC 81 MG tablet   clobetasol ointment 0.05 % Commonly known as:  TEMOVATE   cyclobenzaprine 10 MG tablet Commonly known as:  FLEXERIL   hydrOXYzine 50 MG capsule Commonly known as:  VISTARIL   insulin aspart 100 UNIT/ML injection Commonly known as:  novoLOG   insulin NPH Human 100 UNIT/ML injection Commonly known as:  HUMULIN N,NOVOLIN N   omeprazole 20 MG capsule Commonly known as:  PRILOSEC   Oxycodone HCl 10 MG Tabs   zolpidem 5 MG tablet Commonly known as:  AMBIEN     TAKE these medications   FLUoxetine 40 MG capsule Commonly known as:  PROZAC Take 1 capsule (40 mg total) by mouth daily.   gabapentin 300 MG capsule Commonly known as:  NEURONTIN TAKE ONE CAPSULE BY MOUTH THREE TIMES DAILY   ibuprofen 600 MG tablet Commonly known as:  ADVIL,MOTRIN Take 1 tablet (600 mg total) by mouth every 6 (six) hours as needed (pain).    metFORMIN 500 MG tablet Commonly known as:  GLUCOPHAGE Take 1 tablet (500 mg total) by mouth 2 (two) times daily with a meal.   VITAFOL-NANO 18-0.6-0.4 MG Tabs Take 1 tablet by mouth daily.       Diet: Carb modified diet  Activity: Advance as tolerated. Pelvic rest for 6 weeks.   Outpatient follow up:4 weeks Follow up Appt:No future appointments. Follow up Visit:No follow-ups on file.  Postpartum contraception: IUD Mirena, get at outpatient  Newborn Data: Live born female  Birth Weight: 6 lb 14.4 oz (3130 g) APGAR: 8, 9  Newborn Delivery   Birth date/time:  07/18/2018 04:16:00 Delivery type:  Vaginal, Spontaneous     Baby Feeding: Breast Disposition:home with mother   07/20/2018 Shelly SchatzPatricia Dell, MD   OB FELLOW DISCHARGE ATTESTATION  I have seen and examined this patient and agree with above documentation in the resident's note.   Patient with h/o T2DM requiring insulin during pregnancy. Will d/c with Metformin. History of depression, patient requests Rx for Prozac. Have sent to pharmacy.   Shelly Lane, D.O. OB Fellow  07/20/2018, 7:52 AM

## 2018-07-20 NOTE — Progress Notes (Signed)
CSW attempted to meet with MOB to better clarify substance use.  When CSW attempted to meet with MOB, MOB was in the shower and infant was asleep in bassinet. CSW will attempt to meet with MOB prior to MOB's discharge.   Blaine HamperAngel Boyd-Gilyard, MSW, LCSW Clinical Social Work [phone]

## 2018-07-21 ENCOUNTER — Encounter: Payer: Self-pay | Admitting: Obstetrics and Gynecology

## 2018-07-25 ENCOUNTER — Telehealth: Payer: Self-pay

## 2018-07-25 NOTE — Telephone Encounter (Signed)
Pt called stating she is experiencing bleeding and bad cramps. She is PP day 8, was at her babys pediatrician today and the lactation consultant advised her to make an appointment to see us. Based on pts symptoms I advised her to come in tomorrow to be seen at 3:15 with Dr. Alysia PennaErvin. The patient states she cannot see a female physician, I advised pt this is the only opening we have and [facility]. Pt verbalized understanding and states she will go to the hospital.

## 2018-07-26 ENCOUNTER — Encounter: Payer: Self-pay | Admitting: Obstetrics and Gynecology

## 2018-08-17 IMAGING — US US MFM FETAL BPP W/O NON-STRESS
1 series · 13 of 15 positions shown · non-contrast
Comparison: none

[Series 1: us mfm fetal bpp w/o non-stress · 15 acquisitions, 13 frames shown]
[im 1/15]
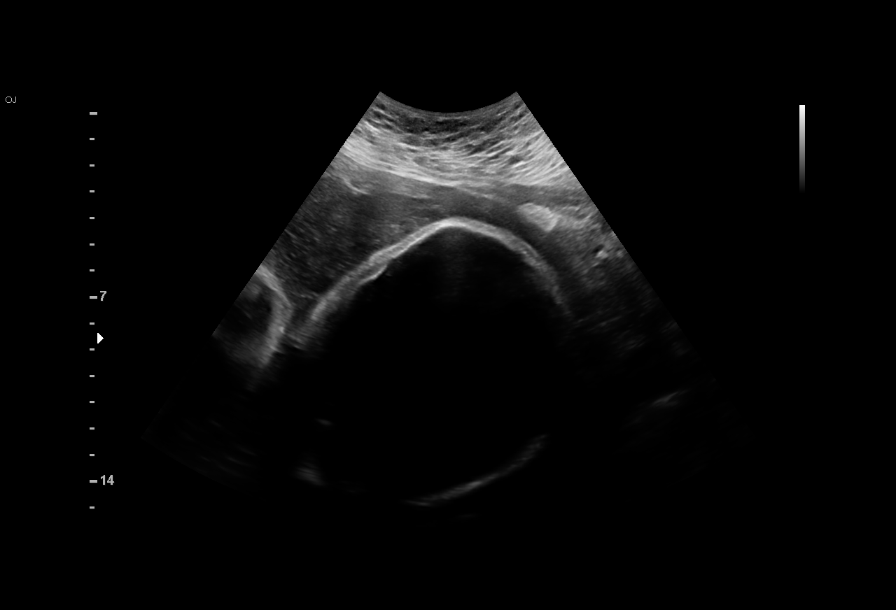
[im 2/15]
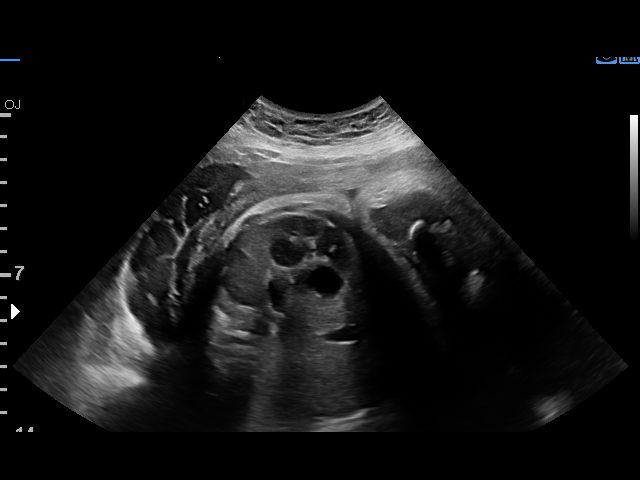
[im 3/15]
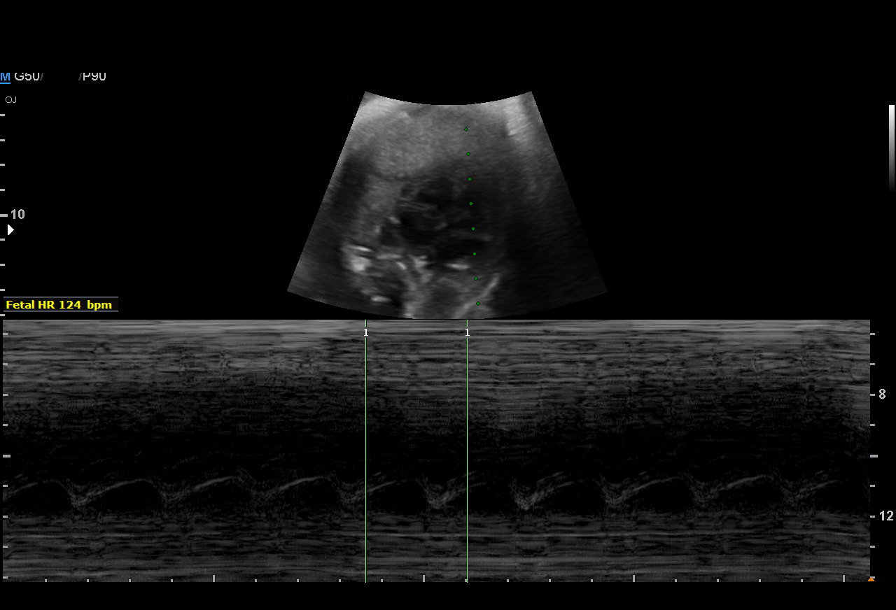
[im 5/15]
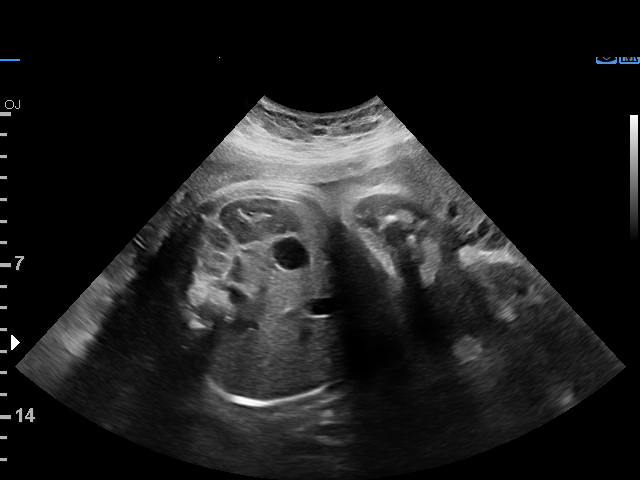
[im 6/15]
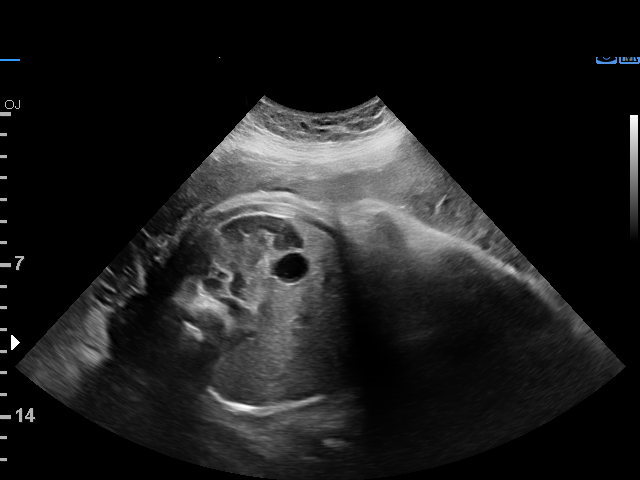
[im 7/15]
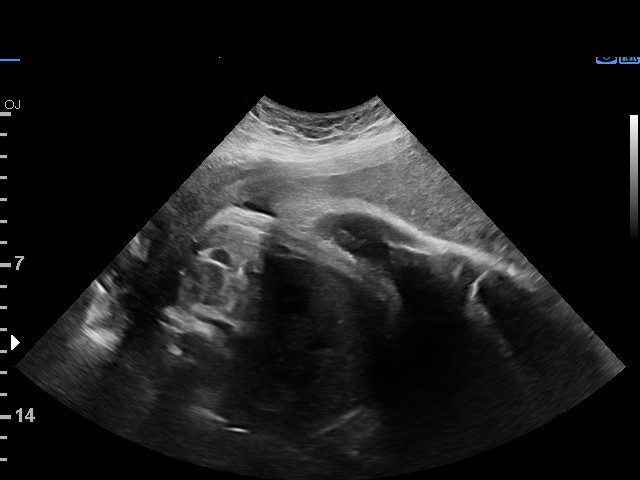
[im 8/15]
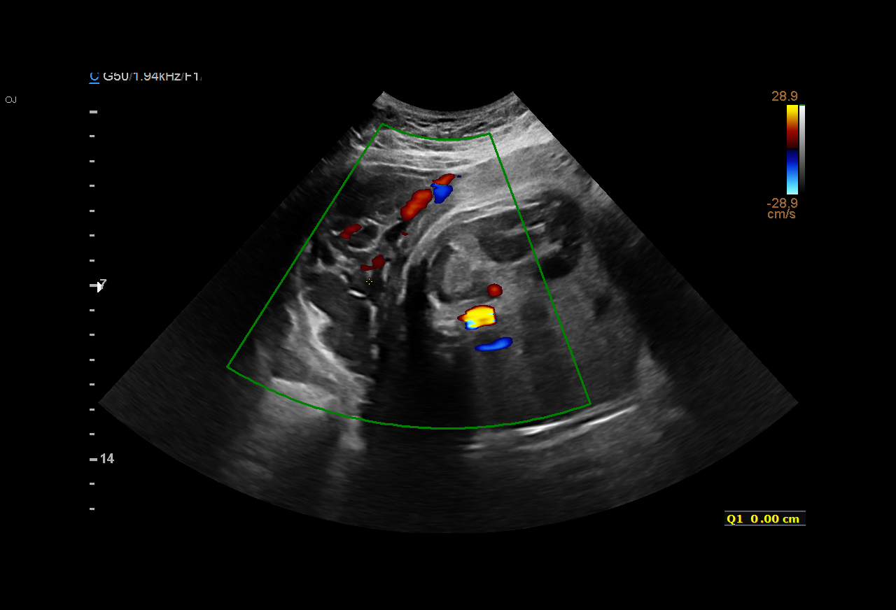
[im 9/15]
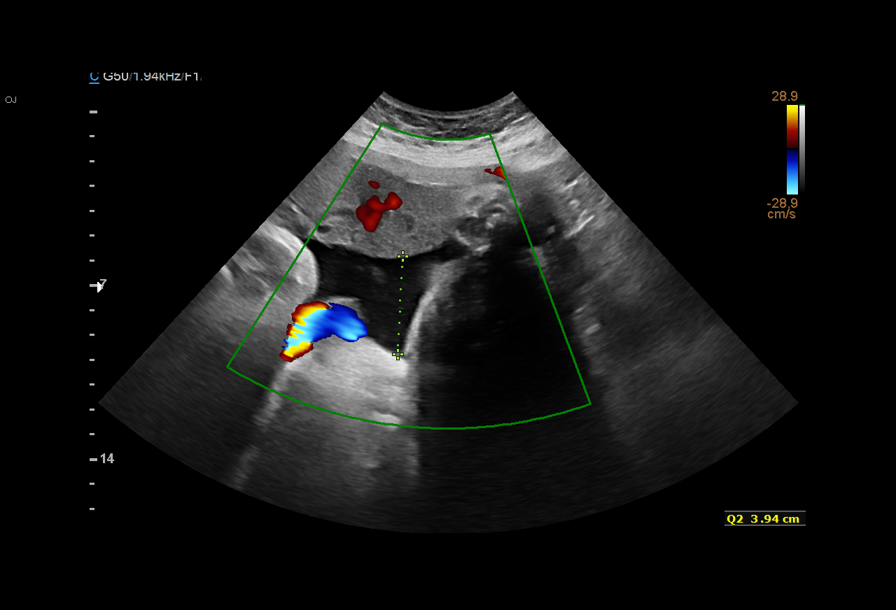
[im 10/15]
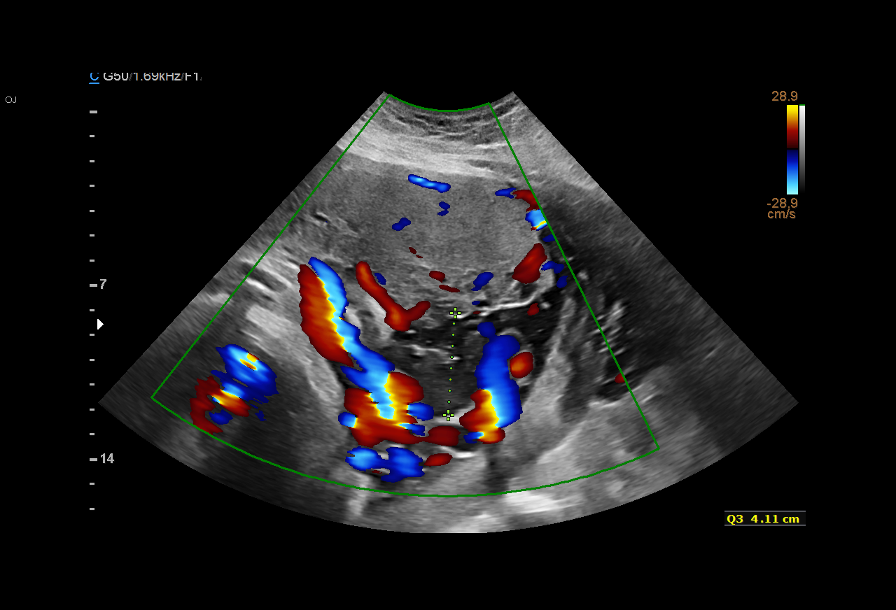
[im 11/15]
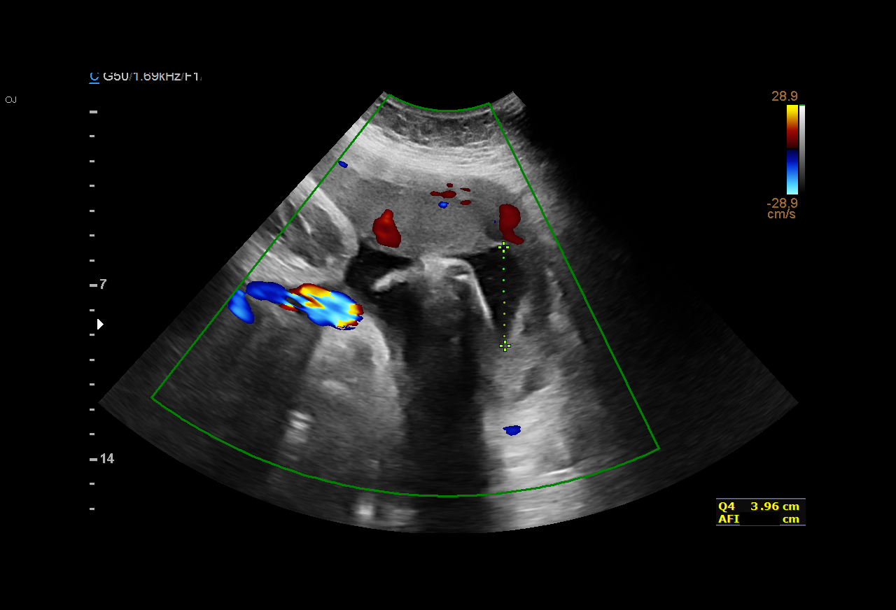
[im 13/15]
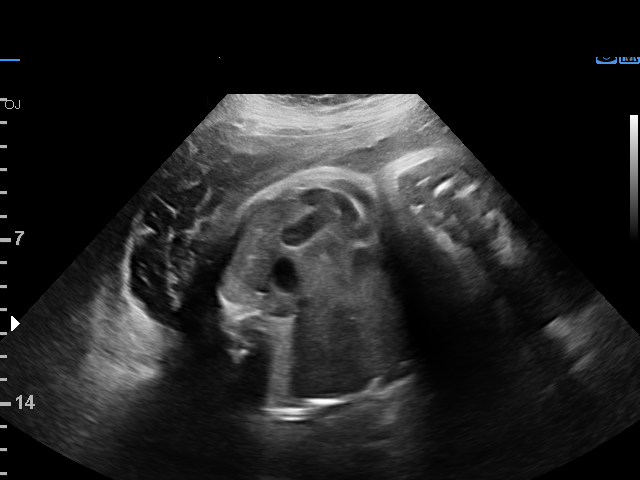
[im 14/15]
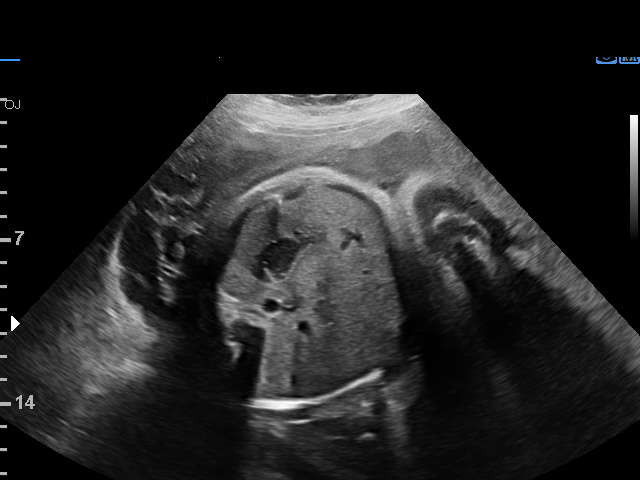
[im 15/15]
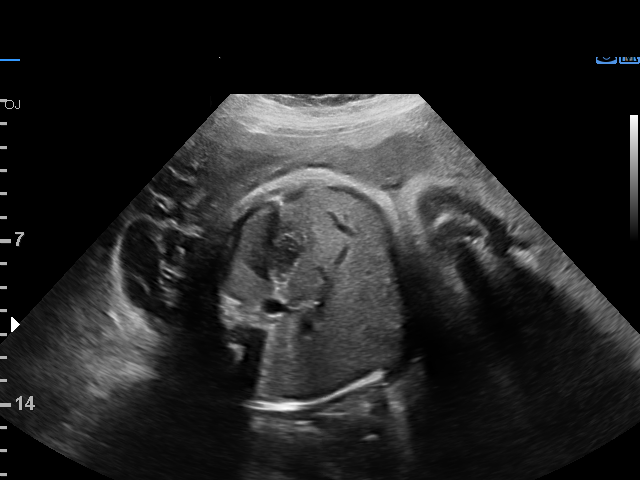

[13 of 15 positions shown; findings below may reference images not displayed]

OB/Gyn Clinic
Attending:        Kaudinge Sabatha      Secondary Phy.:   [facility]-
MAU/Triage

1  JEAN CARLOS BYARS           [id number]      [id number]     [id number]
Indications

35 weeks gestation of pregnancy
Advanced maternal age multigravida 35+,
third trimester; low risk NIPS
Grand multiparity, antepartum
Poor obstetric history: Previous gestational
diabetes
Gestational diabetes in pregnancy, diet
controlled
OB History

Blood Type:            Height:  5'7"   Weight (lb):  258       BMI:
Gravidity:    12        Term:   6        Prem:   2        SAB:   3
Living:       8
Fetal Evaluation

Num Of Fetuses:     1
Fetal Heart         124
Rate(bpm):
Cardiac Activity:   Observed
Presentation:       Cephalic

Amniotic Fluid
AFI FV:      Subjectively within normal limits

AFI Sum(cm)     %Tile       Largest Pocket(cm)
12.01           37
RUQ(cm)       RLQ(cm)       LUQ(cm)        LLQ(cm)
0
Biophysical Evaluation

Amniotic F.V:   Within normal limits       F. Tone:        Observed
F. Movement:    Observed                   Score:          [DATE]
F. Breathing:   Observed
Gestational Age

Best:          35w 6d     Det. By:  Early Ultrasound         EDD:   04/03/17
(08/08/16)
Impression

Single living intrauterine pregnancy at 35 weeks 6 days.
Normal amniotic fluid volume.
BPP [DATE].
Recommendations

Continue antenatal testing as scheduled.

## 2018-09-01 ENCOUNTER — Encounter: Payer: Self-pay | Admitting: Certified Nurse Midwife

## 2018-09-01 ENCOUNTER — Ambulatory Visit (INDEPENDENT_AMBULATORY_CARE_PROVIDER_SITE_OTHER): Payer: Medicaid Other | Admitting: Certified Nurse Midwife

## 2018-09-01 DIAGNOSIS — Z1389 Encounter for screening for other disorder: Secondary | ICD-10-CM

## 2018-09-01 NOTE — Patient Instructions (Signed)

## 2018-09-01 NOTE — Progress Notes (Signed)
Post Partum Exam  Shelly Lane is a 42 y.o. [id number] female who presents for a postpartum visit. She is 6 weeks postpartum following a spontaneous vaginal delivery. I have fully reviewed the prenatal and intrapartum course. The delivery was at 37w gestational weeks.  Anesthesia: epidural. Postpartum course has been unremarkable. Baby's course has been uncomplication. Baby is feeding by breast. Bleeding no bleeding. Bowel function is normal. Bladder function is normal. Patient is sexually active- recent unprotected intercourse. Contraception method planning is IUD. Postpartum depression screening:neg (score 6)  The following portions of the patient's history were reviewed and updated as appropriate: allergies, current medications and problem list. Last pap smear done 09/08/16 and was Normal  Review of Systems Pertinent items noted in HPI and remainder of comprehensive ROS otherwise negative.    Objective:  unknown if currently breastfeeding.  General:  alert, cooperative, no distress and morbidly obese   Breasts:  inspection negative, no nipple discharge or bleeding, no masses or nodularity palpable  Lungs: clear to auscultation bilaterally  Heart:  regular rate and rhythm and S1, S2 normal  Abdomen: soft, non-tender; bowel sounds normal; no masses,  no organomegaly   Vulva:  not evaluated  Vagina: not evaluated  Cervix:  not evaluated  Corpus: not examined  Adnexa:  not evaluated  Rectal Exam: Not performed.        Assessment/Plan:   1. Postpartum care and examination - Normal postpartum exam. Pap smear not done at today's visit- will do Pap smear prior to IUD insertion in 2 weeks.   - Contraception: IUD- wants Paragard   Follow up in: 2 weeks for IUD insertion and Pap smear  Sharyon Cable, CNM 09/01/18, 3:38 PM

## 2018-09-15 ENCOUNTER — Encounter: Payer: Self-pay | Admitting: Obstetrics and Gynecology

## 2018-09-15 ENCOUNTER — Ambulatory Visit (INDEPENDENT_AMBULATORY_CARE_PROVIDER_SITE_OTHER): Payer: Medicaid Other | Admitting: Obstetrics and Gynecology

## 2018-09-15 VITALS — BP 112/79 | HR 83 | Wt 258.0 lb

## 2018-09-15 DIAGNOSIS — Z30430 Encounter for insertion of intrauterine contraceptive device: Secondary | ICD-10-CM | POA: Diagnosis not present

## 2018-09-15 DIAGNOSIS — Z3202 Encounter for pregnancy test, result negative: Secondary | ICD-10-CM

## 2018-09-15 LAB — POCT URINE PREGNANCY: Preg Test, Ur: NEGATIVE

## 2018-09-15 MED ORDER — PARAGARD INTRAUTERINE COPPER IU IUD
INTRAUTERINE_SYSTEM | Freq: Once | INTRAUTERINE | Status: AC
  Administered 2018-09-15: 1 via INTRAUTERINE

## 2018-09-15 NOTE — Progress Notes (Signed)
Pt presents for Paragard IUD insertion  Pt has concerns due to spotting while breast feeding   Denies any unprotected intercourse x 14 days   UPT: NEG  Pt states w/ last IUD insertion it had to be done under U/S pt states she made that aware when she scheduled appt.

## 2018-09-15 NOTE — Progress Notes (Signed)
    IUD INSERTION PROCEDURE NOTE  Shelly Lane is a 42 y.o. [id number] here for Paraguard insertion. No GYN concerns.   She was counseled regarding the risks/benefits of IUD including insertion risk of infection, hemorrhage, damage to surrounding tissue and organs, uterine perforation. She was counseled regarding risks of IUD including implantation into uterine wall, migration outside of uterus, possible need for hysteroscopic or laparoscopic removal, expulsion. She was advised that risk of pregnancy is low with negative UPT but is not zero and IUD insertion may cause miscarriage. Reviewed that she is also at slightly higher risk for ectopic pregnancy and she should take a pregnancy test if she believes she may be pregnant. She was advised to use backup method of protection for one week. She verbalized understanding of all of the above and consent signed.   Last intercourse was 3 weeks ago and unprotected Last pap smear was on 09/2016 and was ASCUS, neg hr HPV UPT today: negative  IUD Insertion  Patient identified and an adequate time out was performed. Speculum placed in the vagina. The cervix was cleaned with Betadine x 2 and grasped anteriorly with a single tooth tenaculum.  A uterine sound was used to sound the uterus to 9 cm;  the IUD was then placed per manufacturer's recommendations. Strings trimmed to 3 cm. Tenaculum was removed, good hemostasis noted. Patient tolerated procedure well.   Patient was given post-procedure instructions.  She was reminded to have backup contraception for one week during this transition period between IUDs.  Patient was also asked to check IUD strings periodically and follow up in 4 weeks for IUD check.  Paraguard IUD Exp: 06/2024 Lot: [id number]  K. Therese Sarah, M.D. [facility]

## 2018-09-22 ENCOUNTER — Ambulatory Visit (HOSPITAL_COMMUNITY): Admission: RE | Admit: 2018-09-22 | Payer: Medicaid Other | Source: Ambulatory Visit

## 2018-09-26 ENCOUNTER — Ambulatory Visit (HOSPITAL_COMMUNITY): Admission: RE | Admit: 2018-09-26 | Payer: Medicaid Other | Source: Ambulatory Visit

## 2018-10-13 ENCOUNTER — Ambulatory Visit: Payer: Self-pay | Admitting: Advanced Practice Midwife

## 2018-10-20 ENCOUNTER — Encounter: Payer: Self-pay | Admitting: Certified Nurse Midwife

## 2018-10-20 ENCOUNTER — Other Ambulatory Visit: Payer: Self-pay | Admitting: Certified Nurse Midwife

## 2018-10-20 ENCOUNTER — Ambulatory Visit (INDEPENDENT_AMBULATORY_CARE_PROVIDER_SITE_OTHER): Payer: Medicaid Other | Admitting: Certified Nurse Midwife

## 2018-10-20 VITALS — BP 108/64 | HR 106 | Wt 261.4 lb

## 2018-10-20 DIAGNOSIS — N6311 Unspecified lump in the right breast, upper outer quadrant: Secondary | ICD-10-CM | POA: Diagnosis not present

## 2018-10-20 DIAGNOSIS — Z3009 Encounter for other general counseling and advice on contraception: Secondary | ICD-10-CM | POA: Diagnosis not present

## 2018-10-20 DIAGNOSIS — Z3202 Encounter for pregnancy test, result negative: Secondary | ICD-10-CM

## 2018-10-20 DIAGNOSIS — N6310 Unspecified lump in the right breast, unspecified quadrant: Secondary | ICD-10-CM

## 2018-10-20 LAB — POCT URINE PREGNANCY: Preg Test, Ur: NEGATIVE

## 2018-10-20 MED ORDER — NORETHINDRONE 0.35 MG PO TABS: 1.0000 | ORAL_TABLET | Freq: Every day | ORAL | 11 refills | Status: DC

## 2018-10-20 NOTE — Patient Instructions (Signed)
Breast Cyst A breast cyst is a sac in the breast that is filled with fluid. Breast cysts are usually noncancerous (benign). They are common among women, and they are most often located in the upper, outer portion of the breast. One or more cysts may develop. They form when fluid builds up inside of the breast glands. There are several types of breast cysts:  Macrocyst. This is a cyst that is about 2 inches (5.1 cm) across (in diameter).  Microcyst. This is a very small cyst that you cannot feel, but it can be seen with imaging tests such as an X-ray of the breast (mammogram) or ultrasound.  Galactocele. This is a cyst that contains milk. It may develop if you suddenly stop breastfeeding.  Breast cysts do not increase your risk of breast cancer. They usually disappear after menopause, unless you take artificial hormones (are on hormone therapy). What are the causes? The exact cause of breast cysts is not known. Possible causes include:  Blockage of tubes (ducts) in the breast glands, which leads to fluid buildup. Duct blockage may result from: ? Fibrocystic breast changes. This is a common, benign condition that occurs when women go through hormonal changes during the menstrual cycle. This is a common cause of multiple breast cysts. ? Overgrowth of breast tissue or breast glands. ? Scar tissue in the breast from previous surgery.  Changes in certain female hormones (estrogen and progesterone).  What increases the risk? You may be more likely to develop breast cysts if you have not gone through menopause. What are the signs or symptoms? Symptoms of a breast cyst may include:  Feeling one or more smooth, round, soft lumps (like grapes) in the breast that are easily moveable. The lump(s) may get bigger and more painful before your period and get smaller after your period.  Breast discomfort or pain.  How is this diagnosed? A cyst can be felt during a physical exam by your health care  provider. A mammogram and ultrasound will be done to confirm the diagnosis. Fluid may be removed from the cyst with a needle (fine-needle aspiration) and tested to make sure the cyst is not cancerous. How is this treated? Treatment may not be necessary. Your health care provider may monitor the cyst to see if it goes away on its own. If the cyst is uncomfortable or gets bigger, or if you do not like how the cyst makes your breast look, you may need treatment. Treatment may include:  Hormone treatment.  Fine-needle aspiration, to drain fluid from the cyst. There is a chance of the cyst coming back (recurring) after aspiration.  Surgery to remove the cyst.  Follow these instructions at home:  See your health care provider regularly. ? Get a yearly physical exam. ? If you are 20-40 years old, get a clinical breast exam every 1-3 years. After age 40, get this exam every year. ? Get mammograms as often as directed.  Do a breast self-exam every month, or as often as directed. Having many breast cysts, or "lumpy" breasts, may make it harder to feel for new lumps. Understand how your breasts normally look and feel, and write down any changes in your breasts so you can tell your health care provider about the changes. A breast self-exam involves: ? Comparing your breasts in the mirror. ? Looking for visible changes in your skin or nipples. ? Feeling for lumps or changes.  Take over-the-counter and prescription medicines only as told by your health care   provider.  Wear a supportive bra, especially when exercising.  Follow instructions from your health care provider about eating and drinking restrictions. ? Avoid caffeine. ? Cut down on salt (sodium) in what you eat and drink, especially before your menstrual period. Too much sodium can cause fluid buildup (retention), breast swelling, and discomfort.  Keep all follow-up visits as told your health care provider. This is important. Contact a  health care provider if:  You feel, or think you feel, a lump in your breast.  You notice that both breasts look or feel different than usual.  Your breast is still causing pain after your menstrual period is over.  You find new lumps or bumps that were not there before.  You feel lumps in your armpit (axilla). Get help right away if:  You have severe pain, tenderness, redness, or warmth in your breast.  You have fluid or blood leaking from your nipple.  Your breast lump becomes hard and painful.  You notice dimpling or wrinkling of the breast or nipple. This information is not intended to replace advice given to you by your health care provider. Make sure you discuss any questions you have with your health care provider. Document Released: 11/23/2005 Document Revised: 08/14/2016 Document Reviewed: 08/14/2016 Elsevier Interactive Patient Education  2017 Elsevier Inc[employer]  

## 2018-10-20 NOTE — Progress Notes (Signed)
Pt complains of having a lump in right breast. First noticed about 2 weeks ago. She states that it has grown in size.

## 2018-10-20 NOTE — Progress Notes (Signed)
  Subjective:     Patient ID: Shelly Lane, female   DOB: June 16, 1976, 42 y.o.   MRN: [id number]  Shelly Lane is a 42 y.o. G12P10 3 months PP who presents to the office with complaints of breast lump. She reports noticing lump of right breast 2 weeks ago. Was a small nodule then increased in size over the past 2 weeks. She thought it was a clogged duct due to her breastfeeding, tried massaging while breastfeeding and heat with no decrease in size. She denies it being painful or tender. She also reports her IUD "falling out last night". Patient presents to office with IUD in hand.   Review of Systems  Constitutional: Negative.   Respiratory: Negative.   Cardiovascular: Negative.   Genitourinary: Negative.   Neurological: Negative.   Positive for breast mass      Objective:   Physical Exam  Constitutional: She is oriented to person, place, and time. She appears well-developed and well-nourished.  HENT:  Head: Normocephalic.  Cardiovascular: Normal rate, regular rhythm and normal heart sounds.  Pulmonary/Chest: Effort normal and breath sounds normal. No respiratory distress. She has no wheezes. Right breast exhibits mass. Right breast exhibits no inverted nipple, no skin change and no tenderness. No breast tenderness. Breasts are symmetrical.  Right breast: 3cm mobile mass palpated of right breast, no retraction, no tenderness, no redness. Breast tissue surrounding mass soft non tender  Left breast: soft, non tender, no masses palpated, no tenderness     Neurological: She is alert and oriented to person, place, and time.  Skin: Skin is warm and dry. No erythema.  Psychiatric: She has a normal mood and affect. Her behavior is normal. Thought content normal.  Nursing note and vitals reviewed.  Today's Vitals   10/20/18 1518  BP: 108/64  Pulse: (!) 106  Weight: 261 lb 6.4 oz (118.6 kg)   Body mass index is 41.87 kg/m. Temperature 97.8 degrees F Assessment/Plan :     1.  Breast mass, right - Most likely breast cyst, Mammogram obtained to r/o other causes  - MM DIAG BREAST TOMO BILATERAL; Future  2. Encounter for general counseling on prescription of oral contraceptives - IUD fell out per patient, IUD in patient's hand  - Request to be on POPs due to patient breastfeeding, UPT in office negative  - norethindrone (MICRONOR,CAMILA,ERRIN) 0.35 MG tablet; Take 1 tablet (0.35 mg total) by mouth daily.  Dispense: 1 Package; Refill: 11 - POCT urine pregnancy    Follow up as scheduled on 11/21 for diagnostic mammogram   Sharyon CableVeronica C Manessa Buley, CNM 10/20/18, 5:17 PM

## 2018-10-27 ENCOUNTER — Ambulatory Visit
Admission: RE | Admit: 2018-10-27 | Discharge: 2018-10-27 | Disposition: A | Discharge status: Home / Self Care | Payer: Medicaid Other | Source: Ambulatory Visit | Attending: Certified Nurse Midwife | Admitting: Certified Nurse Midwife

## 2018-10-27 DIAGNOSIS — N6310 Unspecified lump in the right breast, unspecified quadrant: Secondary | ICD-10-CM

## 2018-12-27 ENCOUNTER — Ambulatory Visit (INDEPENDENT_AMBULATORY_CARE_PROVIDER_SITE_OTHER): Payer: Medicaid Other

## 2018-12-27 DIAGNOSIS — Z3202 Encounter for pregnancy test, result negative: Secondary | ICD-10-CM

## 2018-12-27 DIAGNOSIS — N926 Irregular menstruation, unspecified: Secondary | ICD-10-CM

## 2018-12-27 NOTE — Progress Notes (Addendum)
Shelly Lane presents today for UPT. She had several positive UPT at home. Office UPT negative. Pt requests HCG quant.  LMP: 11/18/18    OBJECTIVE: Appears well, in no apparent distress.  OB History    Gravida  12   Para  10   Term  8   Preterm  2   AB  2   Living  10     SAB  2   TAB      Ectopic      Multiple  0   Live Births  10          Home UPT Result: positive  In-Office UPT result: negative  I have reviewed the patient's medical, obstetrical, social, and family histories, and medications.   ASSESSMENT: Negative pregnancy test  PLAN: Pending quant results

## 2018-12-28 LAB — BETA HCG QUANT (REF LAB): hCG Quant: <1 m[IU]/mL

## 2018-12-28 NOTE — Progress Notes (Signed)
I have reviewed the chart and agree with nursing staff's documentation of this patient's encounter.  Jaynie Collins, MD 12/28/2018 8:18 AM

## 2019-03-25 ENCOUNTER — Other Ambulatory Visit: Payer: Self-pay | Admitting: Obstetrics and Gynecology

## 2019-03-25 DIAGNOSIS — O0993 Supervision of high risk pregnancy, unspecified, third trimester: Secondary | ICD-10-CM

## 2019-07-25 ENCOUNTER — Other Ambulatory Visit: Payer: Self-pay | Admitting: Obstetrics and Gynecology

## 2019-10-26 ENCOUNTER — Inpatient Hospital Stay (HOSPITAL_COMMUNITY)
Admission: EM | Admit: 2019-10-26 | Discharge: 2019-10-26 | Disposition: A | Discharge status: Home / Self Care | Payer: Medicaid Other | Attending: Obstetrics & Gynecology | Admitting: Obstetrics & Gynecology

## 2019-10-26 ENCOUNTER — Other Ambulatory Visit: Payer: Self-pay

## 2019-10-26 ENCOUNTER — Encounter (HOSPITAL_COMMUNITY): Payer: Self-pay | Admitting: *Deleted

## 2019-10-26 DIAGNOSIS — N760 Acute vaginitis: Secondary | ICD-10-CM | POA: Diagnosis not present

## 2019-10-26 DIAGNOSIS — B9689 Other specified bacterial agents as the cause of diseases classified elsewhere: Secondary | ICD-10-CM

## 2019-10-26 DIAGNOSIS — Z885 Allergy status to narcotic agent status: Secondary | ICD-10-CM | POA: Insufficient documentation

## 2019-10-26 DIAGNOSIS — F419 Anxiety disorder, unspecified: Secondary | ICD-10-CM | POA: Insufficient documentation

## 2019-10-26 DIAGNOSIS — O99341 Other mental disorders complicating pregnancy, first trimester: Secondary | ICD-10-CM | POA: Diagnosis not present

## 2019-10-26 DIAGNOSIS — O26899 Other specified pregnancy related conditions, unspecified trimester: Secondary | ICD-10-CM

## 2019-10-26 DIAGNOSIS — M25552 Pain in left hip: Secondary | ICD-10-CM | POA: Diagnosis not present

## 2019-10-26 DIAGNOSIS — Z79899 Other long term (current) drug therapy: Secondary | ICD-10-CM | POA: Insufficient documentation

## 2019-10-26 DIAGNOSIS — O26891 Other specified pregnancy related conditions, first trimester: Secondary | ICD-10-CM | POA: Diagnosis not present

## 2019-10-26 DIAGNOSIS — O209 Hemorrhage in early pregnancy, unspecified: Secondary | ICD-10-CM | POA: Insufficient documentation

## 2019-10-26 DIAGNOSIS — M25562 Pain in left knee: Secondary | ICD-10-CM | POA: Insufficient documentation

## 2019-10-26 DIAGNOSIS — Z3A12 12 weeks gestation of pregnancy: Secondary | ICD-10-CM | POA: Insufficient documentation

## 2019-10-26 DIAGNOSIS — R102 Pelvic and perineal pain: Secondary | ICD-10-CM | POA: Diagnosis not present

## 2019-10-26 DIAGNOSIS — O23591 Infection of other part of genital tract in pregnancy, first trimester: Secondary | ICD-10-CM

## 2019-10-26 LAB — POCT PREGNANCY, URINE: Preg Test, Ur: POSITIVE — AB

## 2019-10-26 LAB — URINALYSIS, ROUTINE W REFLEX MICROSCOPIC
Bilirubin Urine: NEGATIVE
Glucose, UA: NEGATIVE mg/dL
Hgb urine dipstick: NEGATIVE
Ketones, ur: 80 mg/dL — AB
Leukocytes,Ua: NEGATIVE
Nitrite: NEGATIVE
Protein, ur: 30 mg/dL — AB
Specific Gravity, Urine: 1.025 (ref 1.005–1.030)
pH: 6 (ref 5.0–8.0)

## 2019-10-26 LAB — WET PREP, GENITAL
Trich, Wet Prep: NONE SEEN
Yeast Wet Prep HPF POC: NONE SEEN

## 2019-10-26 MED ORDER — METRONIDAZOLE 500 MG PO TABS: 500.0000 mg | ORAL_TABLET | Freq: Two times a day (BID) | ORAL | 0 refills | Status: AC

## 2019-10-26 NOTE — MAU Provider Note (Signed)
Chief Complaint: No chief complaint on file.   First Provider Initiated Contact with Patient 10/26/19 2004        SUBJECTIVE HPI: Shelly Lane is a 43 y.o. [id number] at [redacted]w[redacted]d by LMP who presents to maternity admissions reporting cramping for several days. Also has some pain that starts in left hip and radiates to left thigh to knee. Has history of sciatica on that side. Chiropractor has helped in the past.. She denies vaginal bleeding, vaginal itching/burning, urinary symptoms, h/a, dizziness, n/v, or fever/chills.     Vaginal Bleeding The patient's primary symptoms include pelvic pain and vaginal bleeding (pink spotting). The patient's pertinent negatives include no genital itching, genital lesions or genital odor. This is a new problem. The current episode started today. The problem occurs intermittently. The problem has been resolved. The pain is mild. The problem affects the left side. She is pregnant. Associated symptoms include abdominal pain (lower pelvic cramping). Pertinent negatives include no back pain, chills, constipation, diarrhea, fever, frequency, nausea or vomiting. Vaginal discharge characteristics: pink. The vaginal bleeding is spotting. She has not been passing clots. She has not been passing tissue. Nothing aggravates the symptoms. She has tried nothing for the symptoms. She is sexually active. It is unknown whether or not her partner has an STD. She uses nothing for contraception.  Abdominal Pain This is a new problem. The current episode started today. The onset quality is gradual. The problem occurs intermittently. The problem has been unchanged. The quality of the pain is cramping. The abdominal pain does not radiate. Pertinent negatives include no constipation, diarrhea, fever, frequency, nausea or vomiting. Nothing aggravates the pain. The pain is relieved by nothing. She has tried nothing for the symptoms.   Cramping and sharp pains for couple days. Some leg pain in  L thigh and knee for few days. Pain more so in L hip and L knee. Pink spotting today. Has had intercourse in last 24hrs  Past Medical History:  Diagnosis Date  . Abnormal Pap smear    Col[place];Last pap 2012, ASCUS w/ negative HPV  . Anemia    during preg  . Anxiety   . ASCUS with positive high risk HPV   . Depression    ok now  . Eczema   . GERD (gastroesophageal reflux disease)   . Gestational diabetes    diet controlled  . H/O candidiasis   . H/O migraine   . H/O rubella   . H/O varicella   . H/O: depression   . H/O: obesity   . Headache(784.0)   . Hx: UTI (urinary tract infection)   . Miscarriage    3/13 and 7/13   . Opioid use   . Preterm labor   . Vaginal Pap smear, abnormal    Past Surgical History:  Procedure Laterality Date  . CHOLECYSTECTOMY  2008  . CHOLECYSTECTOMY  2008  . HERNIA REPAIR  2012  . HIATAL HERNIA REPAIR  2012  . WISDOM TOOTH EXTRACTION     Social History   Socioeconomic History  . Marital status: Married    Spouse name: Aliou Bia  . Number of children: 9  . Years of education: 40  . Highest education level: Not on file  Occupational History  . Occupation: [employer]  Social Needs  . Financial resource strain: Not hard at all  . Food insecurity    Worry: Never true    Inability: Never true  . Transportation needs    Medical: No  Non-medical: No  Tobacco Use  . Smoking status: Never Smoker  . Smokeless tobacco: Never Used  Substance and Sexual Activity  . Alcohol use: No  . Drug use: Yes    Types: Oxycodone    Comment: per pt prescribed by physician for chronic back pain  . Sexual activity: Yes    Partners: Male  Lifestyle  . Physical activity    Days per week: 0 days    Minutes per session: 0 min  . Stress: To some extent  Relationships  . Social connections    Talks on phone: More than three times a week    Gets together: Twice a week    Attends religious service: More than 4 times per year    Active member of club or  organization: Yes    Attends meetings of clubs or organizations: 1 to 4 times per year    Relationship status: Married  . Intimate partner violence    Fear of current or ex partner: No    Emotionally abused: No    Physically abused: No    Forced sexual activity: No  Other Topics Concern  . Not on file  Social History Narrative  . Not on file   No current facility-administered medications on file prior to encounter.    Current Outpatient Medications on File Prior to Encounter  Medication Sig Dispense Refill  . buPROPion (WELLBUTRIN SR) 100 MG 12 hr tablet Take 100 mg by mouth 2 (two) times daily.    . cyclobenzaprine (FLEXERIL) 10 MG tablet Take 10 mg by mouth 3 (three) times daily as needed for muscle spasms.    . Prenatal-Fe Fum-Methf-FA w/o A (VITAFOL-NANO) 18-0.6-0.4 MG TABS Take 1 tablet by mouth daily. 30 tablet 12  . zolpidem (AMBIEN) 5 MG tablet Take 5 mg by mouth at bedtime as needed for sleep.    Marland Kitchen. FLUoxetine (PROZAC) 40 MG capsule Take 1 capsule (40 mg total) by mouth daily. 30 capsule 3  . ibuprofen (ADVIL,MOTRIN) 600 MG tablet Take 1 tablet (600 mg total) by mouth every 6 (six) hours as needed (pain). 30 tablet 0  . norethindrone (MICRONOR,CAMILA,ERRIN) 0.35 MG tablet Take 1 tablet (0.35 mg total) by mouth daily. 1 Package 11  . oxycodone (OXY-IR) 5 MG capsule Take 10 mg by mouth every 4 (four) hours as needed.     Allergies  Allergen Reactions  . Subutex [Buprenorphine] Itching  . Vicodin [Hydrocodone-Acetaminophen] Itching    I have reviewed patient's Past Medical Hx, Surgical Hx, Family Hx, Social Hx, medications and allergies.   ROS:  Review of Systems  Constitutional: Negative for chills and fever.  Gastrointestinal: Positive for abdominal pain (lower pelvic cramping). Negative for constipation, diarrhea, nausea and vomiting.  Genitourinary: Positive for pelvic pain and vaginal bleeding. Negative for frequency.  Musculoskeletal: Negative for back pain.    Review of Systems  Other systems negative   Physical Exam  Physical Exam Patient Vitals for the past 24 hrs:  BP Temp Pulse Resp SpO2 Height Weight  10/26/19 1934 121/76 98.2 F (36.8 C) (!) 111 18 - - -  10/26/19 1931 - - - - - 5\' 6"  (1.676 m) 119.7 kg  10/26/19 1828 126/79 98.7 F (37.1 C) (!) 126 20 100 % - -   Constitutional: Well-developed, well-nourished female in no acute distress.  Cardiovascular: normal rate Respiratory: normal effort GI: Abd soft, non-tender. Pos BS x 4 MS: Extremities nontender, no edema, normal ROM Neurologic: Alert and oriented x 4.  GU: Neg CVAT.  PELVIC EXAM:   Cervix long and closed  FHT 160 by [place]  LAB RESULTS Results for orders placed or performed during the hospital encounter of 10/26/19 (from the past 24 hour(s))  Urinalysis, Routine w reflex microscopic     Status: Abnormal   Collection Time: 10/26/19  8:09 PM  Result Value Ref Range   Color, Urine AMBER (A) YELLOW   APPearance HAZY (A) CLEAR   Specific Gravity, Urine 1.025 1.005 - 1.030   pH 6.0 5.0 - 8.0   Glucose, UA NEGATIVE NEGATIVE mg/dL   Hgb urine dipstick NEGATIVE NEGATIVE   Bilirubin Urine NEGATIVE NEGATIVE   Ketones, ur 80 (A) NEGATIVE mg/dL   Protein, ur 30 (A) NEGATIVE mg/dL   Nitrite NEGATIVE NEGATIVE   Leukocytes,Ua NEGATIVE NEGATIVE   RBC / HPF 11-20 0 - 5 RBC/hpf   WBC, UA 6-10 0 - 5 WBC/hpf   Bacteria, UA RARE (A) NONE SEEN   Squamous Epithelial / LPF 0-5 0 - 5   Mucus PRESENT   Pregnancy, urine POC     Status: Abnormal   Collection Time: 10/26/19  8:15 PM  Result Value Ref Range   Preg Test, Ur POSITIVE (A) NEGATIVE  Wet prep, genital     Status: Abnormal   Collection Time: 10/26/19  8:52 PM  Result Value Ref Range   Yeast Wet Prep HPF POC NONE SEEN NONE SEEN   Trich, Wet Prep NONE SEEN NONE SEEN   Clue Cells Wet Prep HPF POC PRESENT (A) NONE SEEN   WBC, Wet Prep HPF POC FEW (A) NONE SEEN   Sperm PRESENT       IMAGING Pt informed that the  ultrasound is considered a limited OB ultrasound and is not intended to be a complete ultrasound exam.  Patient also informed that the ultrasound is not being completed with the intent of assessing for fetal or placental anomalies or any pelvic abnormalities.  Explained that the purpose of today's ultrasound is to assess for Gestational Age and FHR.  Patient acknowledges the purpose of the exam and the limitations of the study.    Single Fetus measures [redacted]w[redacted]d by CRL FHR 160 Fetus active  MAU Management/MDM: Exam done and was normal No evidence of threatened miscarriage No blood seen on exam Cervix long and closed Fetus measures [redacted]w[redacted]d  Discussed pain in left hip and thigh is likely musculoskeletal in nature Bleeding may have been implantation bleeding or cervix irritation  ASSESSMENT Single intrauterine pregnancy at [redacted]w[redacted]d Pelvic cramping Left hip and thigh pain Spotting in first trimester with viable fetus  PLAN Discharge home Encouraged to seek prenatal care [place] to see chiropractor for hip pain  Pt stable at time of discharge. Encouraged to return here or to other Urgent Care/ED if she develops worsening of symptoms, increase in pain, fever, or other concerning symptoms.    Wynelle Bourgeois CNM, MSN Certified Nurse-Midwife 10/26/2019  8:04 PM

## 2019-10-26 NOTE — Discharge Instructions (Signed)
Activity Restriction During Pregnancy °Your health care provider may recommend specific activity restrictions during pregnancy for a variety of reasons. Activity restriction may require that you limit activities that require great effort, such as exercise, lifting, or sex. °The type of activity restriction will vary for each person, depending on your risk or the problems you are having. Activity restriction may be recommended for a period of time until your baby is delivered. °Why are activity restrictions recommended? °Activity restriction may be recommended if: °· Your placenta is partially or completely covering the opening of your cervix (placenta previa). °· There is bleeding between the wall of the uterus and the amniotic sac in the first trimester of pregnancy (subchorionic hemorrhage). °· You went into labor too early (preterm labor). °· You have a history of miscarriage. °· You have a condition that causes high blood pressure during pregnancy (preeclampsia or eclampsia). °· You are pregnant with more than one baby. °· Your baby is not growing well. °What are the risks? °The risks depend on your specific restriction. Strict bed rest has the most physical and emotional risks and is no longer routinely recommended. Risks of strict bed rest include: °· Loss of muscle conditioning from not moving. °· Blood clots. °· Social isolation. °· Depression. °· Loss of income. °Talk with your health care team about activity restriction to decide if it is best for you and your baby. Even if you are having problems during your pregnancy, you may be able to continue with normal levels of activity with careful monitoring by your health care team. °Follow these instructions at home: °If needed, based on your overall health and the health of your baby, your health care provider will decide which type of activity restriction is right for you. Activity restrictions may include: °· Not lifting anything heavier than 10 pounds (4.5  kg). °· Avoiding activities that take a lot of physical effort. °· No lifting or straining. °· Resting in a sitting position or lying down for periods of time during the day. °Pelvic rest may be recommended along with activity restrictions. If pelvic rest is recommended, then: °· Do not have sex, an orgasm, or use sexual stimulation. °· Do not use tampons. Do not douche. Do not put anything into your vagina. °· Do not lift anything that is heavier than 10 lb (4.5 kg). °· Avoid activities that require a lot of effort. °· Avoid any activity in which your pelvic muscles could become strained, such as squatting. °Questions to ask your health care provider °· Why is my activity being limited? °· How will activity restrictions affect my body? °· Why is rest helpful for me and my baby? °· What activities can I do? °· When can I return to normal activities? °When should I seek immediate medical care? °Seek immediate medical care if you have: °· Vaginal bleeding. °· Vaginal discharge. °· Cramping pain in your lower abdomen. °· Regular contractions. °· A low, dull backache. °Summary °· Your health care provider may recommend specific activity restrictions during pregnancy for a variety of reasons. °· Activity restriction may require that you limit activities such as exercise, lifting, sex, or any other activity that requires great effort. °· Discuss the risks and benefits of activity restriction with your health care team to decide if it is best for you and your baby. °· Contact your health care provider right away if you think you are having contractions, or if you notice vaginal bleeding, discharge, or cramping. °This information is not   intended to replace advice given to you by your health care provider. Make sure you discuss any questions you have with your health care provider. Document Released: 03/20/2011 Document Revised: 03/15/2018 Document Reviewed: 03/15/2018 Elsevier Patient Education  2020 [employer]  Bacterial Vaginosis  Bacterial vaginosis is an infection of the vagina. It happens when too many normal germs (healthy bacteria) grow in the vagina. This infection puts you at risk for infections from sex (STIs). Treating this infection can lower your risk for some STIs. You should also treat this if you are pregnant. It can cause your baby to be born early. Follow these instructions at home: Medicines  Take over-the-counter and prescription medicines only as told by your doctor.  Take or use your antibiotic medicine as told by your doctor. Do not stop taking or using it even if you start to feel better. General instructions  If you your sexual partner is a woman, tell her that you have this infection. She needs to get treatment if she has symptoms. If you have a female partner, he does not need to be treated.  During treatment: ? Avoid sex. ? Do not douche. ? Avoid alcohol as told. ? Avoid breastfeeding as told.  Drink enough fluid to keep your pee (urine) clear or pale yellow.  Keep your vagina and butt (rectum) clean. ? Wash the area with warm water every day. ? Wipe from front to back after you use the toilet.  Keep all follow-up visits as told by your doctor. This is important. Preventing this condition  Do not douche.  Use only warm water to wash around your vagina.  Use protection when you have sex. This includes: ? Latex condoms. ? Dental dams.  Limit how many people you have sex with. It is best to only have sex with the same person (be monogamous).  Get tested for STIs. Have your partner get tested.  Wear underwear that is cotton or lined with cotton.  Avoid tight pants and pantyhose. This is most important in summer.  Do not use any products that have nicotine or tobacco in them. These include cigarettes and e-cigarettes. If you need help quitting, ask your doctor.  Do not use illegal drugs.  Limit how much alcohol you drink. Contact a doctor  if:  Your symptoms do not get better, even after you are treated.  You have more discharge or pain when you pee (urinate).  You have a fever.  You have pain in your belly (abdomen).  You have pain with sex.  Your bleed from your vagina between periods. Summary  This infection happens when too many germs (bacteria) grow in the vagina.  Treating this condition can lower your risk for some infections from sex (STIs).  You should also treat this if you are pregnant. It can cause early (premature) birth.  Do not stop taking or using your antibiotic medicine even if you start to feel better. This information is not intended to replace advice given to you by your health care provider. Make sure you discuss any questions you have with your health care provider. Document Released: 09/01/2008 Document Revised: 11/05/2017 Document Reviewed: 08/08/2016 Elsevier Patient Education  2020 [employer]  Vaginal Bleeding During Pregnancy, First Trimester  A small amount of bleeding (spotting) from the vagina is common during early pregnancy. Sometimes the bleeding is normal and does not cause problems. At other times, though, bleeding may be a sign of something serious. Tell your doctor about any bleeding  from your vagina right away. Follow these instructions at home: Activity  Follow your doctor's instructions about how active you can be.  If needed, make plans for someone to help with your normal activities.  Do not have sex or orgasms until your doctor says that this is safe. General instructions  Take over-the-counter and prescription medicines only as told by your doctor.  Watch your condition for any changes.  Write down: ? The number of pads you use each day. ? How often you change pads. ? How soaked (saturated) your pads are.  Do not use tampons.  Do not douche.  If you pass any tissue from your vagina, save it to show to your doctor.  Keep all follow-up visits as told  by your doctor. This is important. Contact a doctor if:  You have vaginal bleeding at any time while you are pregnant.  You have cramps.  You have a fever. Get help right away if:  You have very bad cramps in your back or belly (abdomen).  You pass large clots or a lot of tissue from your vagina.  Your bleeding gets worse.  You feel light-headed.  You feel weak.  You pass out (faint).  You have chills.  You are leaking fluid from your vagina.  You have a gush of fluid from your vagina. Summary  Sometimes vaginal bleeding during pregnancy is normal and does not cause problems. At other times, bleeding may be a sign of something serious.  Tell your doctor about any bleeding from your vagina right away.  Follow your doctor's instructions about how active you can be. You may need someone to help you with your normal activities. This information is not intended to replace advice given to you by your health care provider. Make sure you discuss any questions you have with your health care provider. Document Released: 04/09/2014 Document Revised: 03/14/2019 Document Reviewed: 02/24/2017 Elsevier Patient Education  2020 [employer].  First Trimester of Pregnancy The first trimester of pregnancy is from week 1 until the end of week 13 (months 1 through 3). A week after a sperm fertilizes an egg, the egg will implant on the wall of the uterus. This embryo will begin to develop into a baby. Genes from you and your partner will form the baby. The female genes will determine whether the baby will be a boy or a girl. At 6-8 weeks, the eyes and face will be formed, and the heartbeat can be seen on ultrasound. At the end of 12 weeks, all the baby's organs will be formed. Now that you are pregnant, you will want to do everything you can to have a healthy baby. Two of the most important things are to get good prenatal care and to follow your health care provider's instructions. Prenatal care is  all the medical care you receive before the baby's birth. This care will help prevent, find, and treat any problems during the pregnancy and childbirth. Body changes during your first trimester Your body goes through many changes during pregnancy. The changes vary from woman to woman.  You may gain or lose a couple of pounds at first.  You may feel sick to your stomach (nauseous) and you may throw up (vomit). If the vomiting is uncontrollable, call your health care provider.  You may tire easily.  You may develop headaches that can be relieved by medicines. All medicines should be approved by your health care provider.  You may urinate more often. Painful urination  may mean you have a bladder infection.  You may develop heartburn as a result of your pregnancy.  You may develop constipation because certain hormones are causing the muscles that push stool through your intestines to slow down.  You may develop hemorrhoids or swollen veins (varicose veins).  Your breasts may begin to grow larger and become tender. Your nipples may stick out more, and the tissue that surrounds them (areola) may become darker.  Your gums may bleed and may be sensitive to brushing and flossing.  Dark spots or blotches (chloasma, mask of pregnancy) may develop on your face. This will likely fade after the baby is born.  Your menstrual periods will stop.  You may have a loss of appetite.  You may develop cravings for certain kinds of food.  You may have changes in your emotions from day to day, such as being excited to be pregnant or being concerned that something may go wrong with the pregnancy and baby.  You may have more vivid and strange dreams.  You may have changes in your hair. These can include thickening of your hair, rapid growth, and changes in texture. Some women also have hair loss during or after pregnancy, or hair that feels dry or thin. Your hair will most likely return to normal after your  baby is born. What to expect at prenatal visits During a routine prenatal visit:  You will be weighed to make sure you and the baby are growing normally.  Your blood pressure will be taken.  Your abdomen will be measured to track your baby's growth.  The fetal heartbeat will be listened to between weeks 10 and 14 of your pregnancy.  Test results from any previous visits will be discussed. Your health care provider may ask you:  How you are feeling.  If you are feeling the baby move.  If you have had any abnormal symptoms, such as leaking fluid, bleeding, severe headaches, or abdominal cramping.  If you are using any tobacco products, including cigarettes, chewing tobacco, and electronic cigarettes.  If you have any questions. Other tests that may be performed during your first trimester include:  Blood tests to find your blood type and to check for the presence of any previous infections. The tests will also be used to check for low iron levels (anemia) and protein on red blood cells (Rh antibodies). Depending on your risk factors, or if you previously had diabetes during pregnancy, you may have tests to check for high blood sugar that affects pregnant women (gestational diabetes).  Urine tests to check for infections, diabetes, or protein in the urine.  An ultrasound to confirm the proper growth and development of the baby.  Fetal screens for spinal cord problems (spina bifida) and Down syndrome.  HIV (human immunodeficiency virus) testing. Routine prenatal testing includes screening for HIV, unless you choose not to have this test.  You may need other tests to make sure you and the baby are doing well. Follow these instructions at home: Medicines  Follow your health care provider's instructions regarding medicine use. Specific medicines may be either safe or unsafe to take during pregnancy.  Take a prenatal vitamin that contains at least 600 micrograms (mcg) of folic  acid.  If you develop constipation, try taking a stool softener if your health care provider approves. Eating and drinking   Eat a balanced diet that includes fresh fruits and vegetables, whole grains, good sources of protein such as meat, eggs, or tofu, and  low-fat dairy. Your health care provider will help you determine the amount of weight gain that is right for you.  Avoid raw meat and uncooked cheese. These carry germs that can cause birth defects in the baby.  Eating four or five small meals rather than three large meals a day may help relieve nausea and vomiting. If you start to feel nauseous, eating a few soda crackers can be helpful. Drinking liquids between meals, instead of during meals, also seems to help ease nausea and vomiting.  Limit foods that are high in fat and processed sugars, such as fried and sweet foods.  To prevent constipation: ? Eat foods that are high in fiber, such as fresh fruits and vegetables, whole grains, and beans. ? Drink enough fluid to keep your urine clear or pale yellow. Activity  Exercise only as directed by your health care provider. Most women can continue their usual exercise routine during pregnancy. Try to exercise for 30 minutes at least 5 days a week. Exercising will help you: ? Control your weight. ? Stay in shape. ? Be prepared for labor and delivery.  Experiencing pain or cramping in the lower abdomen or lower back is a good sign that you should stop exercising. Check with your health care provider before continuing with normal exercises.  Try to avoid standing for long periods of time. Move your legs often if you must stand in one place for a long time.  Avoid heavy lifting.  Wear low-heeled shoes and practice good posture.  You may continue to have sex unless your health care provider tells you not to. Relieving pain and discomfort  Wear a good support bra to relieve breast tenderness.  Take warm sitz baths to soothe any pain  or discomfort caused by hemorrhoids. Use hemorrhoid cream if your health care provider approves.  Rest with your legs elevated if you have leg cramps or low back pain.  If you develop varicose veins in your legs, wear support hose. Elevate your feet for 15 minutes, 3-4 times a day. Limit salt in your diet. Prenatal care  Schedule your prenatal visits by the twelfth week of pregnancy. They are usually scheduled monthly at first, then more often in the last 2 months before delivery.  Write down your questions. Take them to your prenatal visits.  Keep all your prenatal visits as told by your health care provider. This is important. Safety  Wear your seat belt at all times when driving.  Make a list of emergency phone numbers, including numbers for family, friends, the hospital, and police and fire departments. General instructions  Ask your health care provider for a referral to a local prenatal education class. Begin classes no later than the beginning of month 6 of your pregnancy.  Ask for help if you have counseling or nutritional needs during pregnancy. Your health care provider can offer advice or refer you to specialists for help with various needs.  Do not use hot tubs, steam rooms, or saunas.  Do not douche or use tampons or scented sanitary pads.  Do not cross your legs for long periods of time.  Avoid cat litter boxes and soil used by cats. These carry germs that can cause birth defects in the baby and possibly loss of the fetus by miscarriage or stillbirth.  Avoid all smoking, herbs, alcohol, and medicines not prescribed by your health care provider. Chemicals in these products affect the formation and growth of the baby.  Do not use any products  that contain nicotine or tobacco, such as cigarettes and e-cigarettes. If you need help quitting, ask your health care provider. You may receive counseling support and other resources to help you quit.  Schedule a dentist  appointment. At home, brush your teeth with a soft toothbrush and be gentle when you floss. Contact a health care provider if:  You have dizziness.  You have mild pelvic cramps, pelvic pressure, or nagging pain in the abdominal area.  You have persistent nausea, vomiting, or diarrhea.  You have a bad smelling vaginal discharge.  You have pain when you urinate.  You notice increased swelling in your face, hands, legs, or ankles.  You are exposed to fifth disease or chickenpox.  You are exposed to [place] measles (rubella) and have never had it. Get help right away if:  You have a fever.  You are leaking fluid from your vagina.  You have spotting or bleeding from your vagina.  You have severe abdominal cramping or pain.  You have rapid weight gain or loss.  You vomit blood or material that looks like coffee grounds.  You develop a severe headache.  You have shortness of breath.  You have any kind of trauma, such as from a fall or a car accident. Summary  The first trimester of pregnancy is from week 1 until the end of week 13 (months 1 through 3).  Your body goes through many changes during pregnancy. The changes vary from woman to woman.  You will have routine prenatal visits. During those visits, your health care provider will examine you, discuss any test results you may have, and talk with you about how you are feeling. This information is not intended to replace advice given to you by your health care provider. Make sure you discuss any questions you have with your health care provider. Document Released: 11/17/2001 Document Revised: 11/05/2017 Document Reviewed: 11/04/2016 Elsevier Patient Education  2020 [employer].

## 2019-10-26 NOTE — MAU Note (Addendum)
Cramping and sharp pains for couple days. Some leg pain in L thigh and knee for few days. Pain more so in L hip and L knee. Pink spotting today. Has had intercourse in last 24hrs

## 2019-10-26 NOTE — ED Notes (Signed)
Patient has 41 yr old daughter with her; pt advised of hospital policy (daughter may not stay). Pt is working on getting someone to pick up her daughter, states that they are on the way.

## 2019-10-27 ENCOUNTER — Encounter (HOSPITAL_COMMUNITY): Payer: Self-pay | Admitting: Advanced Practice Midwife

## 2019-10-27 LAB — GC/CHLAMYDIA PROBE AMP (~~LOC~~) NOT AT ARMC
Chlamydia: NEGATIVE
Comment: NEGATIVE
Comment: NORMAL
Neisseria Gonorrhea: NEGATIVE

## 2019-11-01 ENCOUNTER — Other Ambulatory Visit: Payer: Self-pay

## 2019-11-01 ENCOUNTER — Inpatient Hospital Stay (HOSPITAL_COMMUNITY)
Admission: AD | Admit: 2019-11-01 | Discharge: 2019-11-01 | Disposition: A | Discharge status: Home / Self Care | Payer: Medicaid Other | Attending: Family Medicine | Admitting: Family Medicine

## 2019-11-01 ENCOUNTER — Inpatient Hospital Stay (HOSPITAL_COMMUNITY): Payer: Medicaid Other

## 2019-11-01 ENCOUNTER — Encounter (HOSPITAL_COMMUNITY): Payer: Self-pay

## 2019-11-01 DIAGNOSIS — Z3A12 12 weeks gestation of pregnancy: Secondary | ICD-10-CM | POA: Diagnosis not present

## 2019-11-01 DIAGNOSIS — O418X10 Other specified disorders of amniotic fluid and membranes, first trimester, not applicable or unspecified: Secondary | ICD-10-CM | POA: Diagnosis not present

## 2019-11-01 DIAGNOSIS — Z886 Allergy status to analgesic agent status: Secondary | ICD-10-CM | POA: Insufficient documentation

## 2019-11-01 DIAGNOSIS — E669 Obesity, unspecified: Secondary | ICD-10-CM | POA: Diagnosis not present

## 2019-11-01 DIAGNOSIS — Z3A13 13 weeks gestation of pregnancy: Secondary | ICD-10-CM

## 2019-11-01 DIAGNOSIS — Z885 Allergy status to narcotic agent status: Secondary | ICD-10-CM | POA: Diagnosis not present

## 2019-11-01 DIAGNOSIS — O99011 Anemia complicating pregnancy, first trimester: Secondary | ICD-10-CM | POA: Insufficient documentation

## 2019-11-01 DIAGNOSIS — O09211 Supervision of pregnancy with history of pre-term labor, first trimester: Secondary | ICD-10-CM | POA: Insufficient documentation

## 2019-11-01 DIAGNOSIS — Z833 Family history of diabetes mellitus: Secondary | ICD-10-CM | POA: Insufficient documentation

## 2019-11-01 DIAGNOSIS — Z8249 Family history of ischemic heart disease and other diseases of the circulatory system: Secondary | ICD-10-CM | POA: Diagnosis not present

## 2019-11-01 DIAGNOSIS — O99211 Obesity complicating pregnancy, first trimester: Secondary | ICD-10-CM | POA: Insufficient documentation

## 2019-11-01 DIAGNOSIS — Z79899 Other long term (current) drug therapy: Secondary | ICD-10-CM | POA: Insufficient documentation

## 2019-11-01 DIAGNOSIS — D649 Anemia, unspecified: Secondary | ICD-10-CM | POA: Diagnosis not present

## 2019-11-01 DIAGNOSIS — O208 Other hemorrhage in early pregnancy: Secondary | ICD-10-CM | POA: Diagnosis not present

## 2019-11-01 DIAGNOSIS — O209 Hemorrhage in early pregnancy, unspecified: Secondary | ICD-10-CM

## 2019-11-01 DIAGNOSIS — Z9049 Acquired absence of other specified parts of digestive tract: Secondary | ICD-10-CM | POA: Diagnosis not present

## 2019-11-01 DIAGNOSIS — F419 Anxiety disorder, unspecified: Secondary | ICD-10-CM | POA: Diagnosis not present

## 2019-11-01 DIAGNOSIS — O99341 Other mental disorders complicating pregnancy, first trimester: Secondary | ICD-10-CM | POA: Diagnosis not present

## 2019-11-01 DIAGNOSIS — Z832 Family history of diseases of the blood and blood-forming organs and certain disorders involving the immune mechanism: Secondary | ICD-10-CM | POA: Insufficient documentation

## 2019-11-01 DIAGNOSIS — F329 Major depressive disorder, single episode, unspecified: Secondary | ICD-10-CM | POA: Insufficient documentation

## 2019-11-01 DIAGNOSIS — O468X1 Other antepartum hemorrhage, first trimester: Secondary | ICD-10-CM | POA: Diagnosis not present

## 2019-11-01 DIAGNOSIS — O24410 Gestational diabetes mellitus in pregnancy, diet controlled: Secondary | ICD-10-CM | POA: Insufficient documentation

## 2019-11-01 NOTE — Discharge Instructions (Signed)
Vaginal Bleeding During Pregnancy, First Trimester  A small amount of bleeding (spotting) from the vagina is common during early pregnancy. Sometimes the bleeding is normal and does not cause problems. At other times, though, bleeding may be a sign of something serious. Tell your doctor about any bleeding from your vagina right away. Follow these instructions at home: Activity  Follow your doctor's instructions about how active you can be.  If needed, make plans for someone to help with your normal activities.  Do not have sex or orgasms until your doctor says that this is safe. General instructions  Take over-the-counter and prescription medicines only as told by your doctor.  Watch your condition for any changes.  Write down: ? The number of pads you use each day. ? How often you change pads. ? How soaked (saturated) your pads are.  Do not use tampons.  Do not douche.  If you pass any tissue from your vagina, save it to show to your doctor.  Keep all follow-up visits as told by your doctor. This is important. Contact a doctor if:  You have vaginal bleeding at any time while you are pregnant.  You have cramps.  You have a fever. Get help right away if:  You have very bad cramps in your back or belly (abdomen).  You pass large clots or a lot of tissue from your vagina.  Your bleeding gets worse.  You feel light-headed.  You feel weak.  You pass out (faint).  You have chills.  You are leaking fluid from your vagina.  You have a gush of fluid from your vagina. Summary  Sometimes vaginal bleeding during pregnancy is normal and does not cause problems. At other times, bleeding may be a sign of something serious.  Tell your doctor about any bleeding from your vagina right away.  Follow your doctor's instructions about how active you can be. You may need someone to help you with your normal activities. This information is not intended to replace advice given to  you by your health care provider. Make sure you discuss any questions you have with your health care provider. Document Released: 04/09/2014 Document Revised: 03/14/2019 Document Reviewed: 02/24/2017 Elsevier Patient Education  2020 Elsevier Inc.   Subchorionic Hematoma  A subchorionic hematoma is a gathering of blood between the outer wall of the embryo (chorion) and the inner wall of the womb (uterus). This condition can cause vaginal bleeding. If they cause little or no vaginal bleeding, early small hematomas usually shrink on their own and do not affect your baby or pregnancy. When bleeding starts later in pregnancy, or if the hematoma is larger or occurs in older pregnant women, the condition may be more serious. Larger hematomas may get bigger, which increases the chances of miscarriage. This condition also increases the risk of:  Premature separation of the placenta from the uterus.  Premature (preterm) labor.  Stillbirth. What are the causes? The exact cause of this condition is not known. It occurs when blood is trapped between the placenta and the uterine wall because the placenta has separated from the original site of implantation. What increases the risk? You are more likely to develop this condition if:  You were treated with fertility medicines.  You conceived through in vitro fertilization (IVF). What are the signs or symptoms? Symptoms of this condition include:  Vaginal spotting or bleeding.  Contractions of the uterus. These cause abdominal pain. Sometimes you may have no symptoms and the bleeding may only   be seen when ultrasound images are taken (transvaginal ultrasound). How is this diagnosed? This condition is diagnosed based on a physical exam. This includes a pelvic exam. You may also have other tests, including:  Blood tests.  Urine tests.  Ultrasound of the abdomen. How is this treated? Treatment for this condition can vary. Treatment may  include:  Watchful waiting. You will be monitored closely for any changes in bleeding. During this stage: ? The hematoma may be reabsorbed by the body. ? The hematoma may separate the fluid-filled space containing the embryo (gestational sac) from the wall of the womb (endometrium).  Medicines.  Activity restriction. This may be needed until the bleeding stops. Follow these instructions at home:  Stay on bed rest if told to do so by your health care provider.  Do not lift anything that is heavier than 10 lbs. (4.5 kg) or as told by your health care provider.  Do not use any products that contain nicotine or tobacco, such as cigarettes and e-cigarettes. If you need help quitting, ask your health care provider.  Track and write down the number of pads you use each day and how soaked (saturated) they are.  Do not use tampons.  Keep all follow-up visits as told by your health care provider. This is important. Your health care provider may ask you to have follow-up blood tests or ultrasound tests or both. Contact a health care provider if:  You have any vaginal bleeding.  You have a fever. Get help right away if:  You have severe cramps in your stomach, back, abdomen, or pelvis.  You pass large clots or tissue. Save any tissue for your health care provider to look at.  You have more vaginal bleeding, and you faint or become lightheaded or weak. Summary  A subchorionic hematoma is a gathering of blood between the outer wall of the placenta and the uterus.  This condition can cause vaginal bleeding.  Sometimes you may have no symptoms and the bleeding may only be seen when ultrasound images are taken.  Treatment may include watchful waiting, medicines, or activity restriction. This information is not intended to replace advice given to you by your health care provider. Make sure you discuss any questions you have with your health care provider. Document Released: 03/10/2007  Document Revised: 11/05/2017 Document Reviewed: 01/19/2017 Elsevier Patient Education  2020 Elsevier Inc.  

## 2019-11-01 NOTE — MAU Note (Signed)
Patient is here with c/o vaginal bleeding since 2100. Was here last week and was prescribed a medication which she read may cause miscarriage. That worried her since she started bleeding today.

## 2019-11-01 NOTE — MAU Provider Note (Signed)
Chief Complaint:  Vaginal Bleeding   First Provider Initiated Contact with Patient 11/01/19 0236     HPI: Shelly Lane is a 43 y.o. [id number] at 94w2dwho presents to maternity admissions reporting vaginal bleeding since 2100hrs.  A small amount of cramping with it.  Is worried the Flagyl she was prescribed for BV is causing the bleeding.  She denies LOF, vaginal itching/burning, urinary symptoms, h/a, dizziness, n/v, diarrhea, constipation or fever/chills.    Vaginal Bleeding The patient's primary symptoms include vaginal bleeding. The patient's pertinent negatives include no genital itching, genital lesions, genital odor or pelvic pain. This is a new problem. The current episode started today. The problem occurs intermittently. The problem has been unchanged. She is pregnant. Pertinent negatives include no abdominal pain, constipation, diarrhea, fever, frequency, nausea or vomiting. The vaginal discharge was bloody.     RN Note: Patient is here with c/o vaginal bleeding since 2100. Was here last week and was prescribed a medication which she read may cause miscarriage. That worried her since she started bleeding today  Past Medical History: Past Medical History:  Diagnosis Date  . Abnormal Pap smear    Colpo;Last pap 2012, ASCUS w/ negative HPV  . Anemia    during preg  . Anxiety   . ASCUS with positive high risk HPV   . Depression    ok now  . Eczema   . GERD (gastroesophageal reflux disease)   . Gestational diabetes    diet controlled  . H/O candidiasis   . H/O migraine   . H/O rubella   . H/O varicella   . H/O: depression   . H/O: obesity   . Headache(784.0)   . Hx: UTI (urinary tract infection)   . Miscarriage    3/13 and 7/13   . Opioid use   . Preterm labor   . Vaginal Pap smear, abnormal     Past obstetric history: OB History  Gravida Para Term Preterm AB Living  13 10 8 2 2 10   SAB TAB Ectopic Multiple Live Births  2     0 10    # Outcome Date GA  Lbr Len/2nd Weight Sex Delivery Anes PTL Lv  13 Current           12 Term 07/18/18 [redacted]w[redacted]d 04:11 3130 g M Vag-Spont EPI  LIV  11 Term 03/27/17 [redacted]w[redacted]d 01:21 / 00:02 3890 g F Vag-Spont None  LIV  10 Term 11/15/14 [redacted]w[redacted]d 03:17 / 00:07 3805 g F Vag-Spont Local  LIV  9 Term 10/05/13 [redacted]w[redacted]d 11:10 / 00:02 2934 g F Vag-Spont None  LIV  8 SAB 07/09/12 [redacted]w[redacted]d         7 Term 01/23/10 [redacted]w[redacted]d 25:16 3110 g F Vag-Spont EPI  LIV     Birth Comments: System Generated. Please review and update pregnancy details.  6 Preterm 02/08/09 [redacted]w[redacted]d 10:42 2990 g F Vag-Spont   LIV  5 SAB 2008 [redacted]w[redacted]d         4 Preterm 02/21/04 [redacted]w[redacted]d 05:00 2863 g F Vag-Spont EPI  LIV  3 Term 08/2002 [redacted]w[redacted]d 21:00 4054 g M Vag-Spont EPI  LIV  2 Term 04/1999 [redacted]w[redacted]d 05:00 3430 g M Vag-Spont EPI  LIV  1 Term 11/1997 [redacted]w[redacted]d 12:30 3345 g F Vag-Spont EPI  LIV    Past Surgical History: Past Surgical History:  Procedure Laterality Date  . CHOLECYSTECTOMY  2008  . CHOLECYSTECTOMY  2008  . HERNIA REPAIR  2012  . HIATAL HERNIA REPAIR  2012  .  WISDOM TOOTH EXTRACTION      Family History: Family History  Problem Relation Age of Onset  . Diabetes Maternal Grandmother   . Hypertension Maternal Grandmother   . Anemia Mother     Social History: Social History   Tobacco Use  . Smoking status: Never Smoker  . Smokeless tobacco: Never Used  Substance Use Topics  . Alcohol use: No  . Drug use: Yes    Types: Oxycodone    Comment: per pt prescribed by physician for chronic back pain    Allergies:  Allergies  Allergen Reactions  . Subutex [Buprenorphine] Itching  . Vicodin [Hydrocodone-Acetaminophen] Itching    Meds:  Medications Prior to Admission  Medication Sig Dispense Refill Last Dose  . buPROPion (WELLBUTRIN SR) 100 MG 12 hr tablet Take 100 mg by mouth 2 (two) times daily.   10/31/2019 at Unknown time  . metroNIDAZOLE (FLAGYL) 500 MG tablet Take 1 tablet (500 mg total) by mouth 2 (two) times daily for 7 days. 14 tablet 0 10/31/2019 at  Unknown time  . Prenatal-Fe Fum-Methf-FA w/o A (VITAFOL-NANO) 18-0.6-0.4 MG TABS Take 1 tablet by mouth daily. 30 tablet 12 10/31/2019 at Unknown time  . cyclobenzaprine (FLEXERIL) 10 MG tablet Take 10 mg by mouth 3 (three) times daily as needed for muscle spasms.     Marland Kitchen FLUoxetine (PROZAC) 40 MG capsule Take 1 capsule (40 mg total) by mouth daily. 30 capsule 3   . zolpidem (AMBIEN) 5 MG tablet Take 5 mg by mouth at bedtime as needed for sleep.       I have reviewed patient's Past Medical Hx, Surgical Hx, Family Hx, Social Hx, medications and allergies.   ROS:  Review of Systems  Constitutional: Negative for fever.  Gastrointestinal: Negative for abdominal pain, constipation, diarrhea, nausea and vomiting.  Genitourinary: Positive for vaginal bleeding. Negative for frequency and pelvic pain.   Other systems negative  Physical Exam   Vitals:   11/01/19 0243 11/01/19 0400  BP: 138/75 105/80  Pulse: (!) 107 95  Resp: 20 18  Temp: 98.6 F (37 C)   TempSrc: Oral   SpO2: 100%   Weight: 122 kg   Height: 5\' 6"  (1.676 m)     Constitutional: Well-developed, well-nourished female in no acute distress.  Cardiovascular: normal rate and rhythm Respiratory: normal effort, clear to auscultation bilaterally GI: Abd soft, non-tender, gravid appropriate for gestational age.   No rebound or guarding. MS: Extremities nontender, no edema, normal ROM Neurologic: Alert and oriented x 4.  GU: Neg CVAT.  PELVIC EXAM: Cervix pink, visually closed, without lesion, small to moderate dark red blood in vault, vaginal walls and external genitalia normal Cervix is 1cm external os, closed internal os, long, firm, and high   Labs: No results found for this or any previous visit (from the past 24 hour(s)).   Ref. Range 07/18/2018 01:10  ABO/RH(D) Unknown O POS     Imaging:  US Ob Comp Less 14 Wks  Result Date: 11/01/2019 CLINICAL DATA:  Vaginal bleeding since 2100, G13P10 EXAM: OBSTETRIC <14 WK  ULTRASOUND TECHNIQUE: Transabdominal ultrasound was performed for evaluation of the gestation as well as the maternal uterus and adnexal regions. COMPARISON:  None. FINDINGS: Intrauterine gestational sac: Single Yolk sac:  Not Visualized. Embryo:  Visualized. Cardiac Activity: Visualized. Heart Rate: 153 bpm CRL:   56.2 mm   12 w 1 d                  [place] EDC:  05/14/2020 Subchorionic hemorrhage: Small volume of subchorionic hemorrhage is visualized. Maternal uterus/adnexae: Otherwise normal appearance of the maternal uterus. Corpus luteum in the left ovary. No concerning adnexal lesions or abnormality. No free fluid. IMPRESSION: Single intrauterine gestation with an estimated gestational age of [redacted] weeks 1 day by crown-rump length measurement. Nonvisualization of the yolk sac is often a normal finding at this age of gestation. Small volume subchorionic hemorrhage. Electronically Signed   By: Kreg ShropshirePrice  DeHay M.D.   On: 11/01/2019 03:33   MAU Course/MDM: I have ordered a formal OB ultrasound and reviewed results.  Discussed what a subchorionic hemorrhage is.  Discussed the large ones are associated with risk of miscarriage but small and moderate ones are less so. (per UpToDate) Discussed pelvic rest and that bleeding may come and go over the nest few weeks CDC reference on Metronidazole and low risks to fetus given to patient  Assessment: Single intrauterine pregnancy at 3638w1d Small subchorionic hemorrhage Bleeding in early pregnancy  Plan: Discharge home Bleeding precautions Pelvic rest Follow up in Office for prenatal visits and recheck  Encouraged to return here or to other Urgent Care/ED if she develops worsening of symptoms, increase in pain, fever, or other concerning symptoms.   Pt stable at time of discharge.  Wynelle BourgeoisMarie Rosan Calbert CNM, MSN Certified Nurse-Midwife 11/01/2019 2:37 AM

## 2019-11-06 ENCOUNTER — Encounter: Payer: Self-pay | Admitting: Obstetrics and Gynecology

## 2019-11-06 ENCOUNTER — Ambulatory Visit (INDEPENDENT_AMBULATORY_CARE_PROVIDER_SITE_OTHER): Payer: Medicaid Other | Admitting: Obstetrics and Gynecology

## 2019-11-06 ENCOUNTER — Other Ambulatory Visit: Payer: Self-pay

## 2019-11-06 ENCOUNTER — Other Ambulatory Visit (HOSPITAL_COMMUNITY)
Admission: RE | Admit: 2019-11-06 | Discharge: 2019-11-06 | Disposition: A | Discharge status: Home / Self Care | Payer: Medicaid Other | Source: Ambulatory Visit | Attending: Obstetrics and Gynecology | Admitting: Obstetrics and Gynecology

## 2019-11-06 VITALS — BP 132/77 | HR 66 | Wt 273.0 lb

## 2019-11-06 DIAGNOSIS — Z641 Problems related to multiparity: Secondary | ICD-10-CM

## 2019-11-06 DIAGNOSIS — Z23 Encounter for immunization: Secondary | ICD-10-CM | POA: Diagnosis not present

## 2019-11-06 DIAGNOSIS — O0991 Supervision of high risk pregnancy, unspecified, first trimester: Secondary | ICD-10-CM

## 2019-11-06 DIAGNOSIS — F1990 Other psychoactive substance use, unspecified, uncomplicated: Secondary | ICD-10-CM

## 2019-11-06 DIAGNOSIS — G8929 Other chronic pain: Secondary | ICD-10-CM | POA: Insufficient documentation

## 2019-11-06 DIAGNOSIS — Z3A12 12 weeks gestation of pregnancy: Secondary | ICD-10-CM

## 2019-11-06 DIAGNOSIS — F329 Major depressive disorder, single episode, unspecified: Secondary | ICD-10-CM

## 2019-11-06 DIAGNOSIS — O99341 Other mental disorders complicating pregnancy, first trimester: Secondary | ICD-10-CM

## 2019-11-06 DIAGNOSIS — Z8632 Personal history of gestational diabetes: Secondary | ICD-10-CM

## 2019-11-06 DIAGNOSIS — O0990 Supervision of high risk pregnancy, unspecified, unspecified trimester: Secondary | ICD-10-CM

## 2019-11-06 DIAGNOSIS — O99340 Other mental disorders complicating pregnancy, unspecified trimester: Secondary | ICD-10-CM

## 2019-11-06 DIAGNOSIS — O09899 Supervision of other high risk pregnancies, unspecified trimester: Secondary | ICD-10-CM

## 2019-11-06 DIAGNOSIS — O09521 Supervision of elderly multigravida, first trimester: Secondary | ICD-10-CM

## 2019-11-06 DIAGNOSIS — O09891 Supervision of other high risk pregnancies, first trimester: Secondary | ICD-10-CM

## 2019-11-06 MED ORDER — HYDROXYPROGESTERONE CAPROATE 275 MG/1.1ML ~~LOC~~ SOAJ: 275.0000 mg | Freq: Once | SUBCUTANEOUS | Status: DC

## 2019-11-06 NOTE — Progress Notes (Signed)
INITIAL PRENATAL VISIT NOTE  Subjective:  Shelly Lane is a 43 y.o. [id number] at 59w6dby 12 weeks [place] today for [place] initial prenatal visit. This is an unplanned pregnancy. She was using IUD for birth control previously but it fell out, she did not do anything else for contraception. She has an obstetric history significant for 8 x SVD @ term, 2 x SVD pre-term. She has a medical history significant for T2DM, diagnosed last pregnancy, not following any diet. Reports she "doesn't have diabetes" and an A1C not long ago was normal.  Patient reports she had heavy bleeding for a few days, now is spotting . Diagnosed with subchorionic hemorrhage.  Contractions: Not present. Vag. Bleeding: None.   . Denies leaking of fluid.   Past Medical History:  Diagnosis Date  . Abnormal Pap smear    Colpo;Last pap 2012, ASCUS w/ negative HPV  . Anemia    during preg  . Anxiety   . ASCUS with positive high risk HPV   . Depression    ok now  . Eczema   . GERD (gastroesophageal reflux disease)   . Gestational diabetes    diet controlled  . H/O candidiasis   . H/O migraine   . H/O rubella   . H/O varicella   . H/O: depression   . H/O: obesity   . Headache(784.0)   . Hx: UTI (urinary tract infection)   . Miscarriage    3/13 and 7/13   . Opioid use   . Preterm labor   . Vaginal Pap smear, abnormal    Past Surgical History:  Procedure Laterality Date  . CHOLECYSTECTOMY  2008  . CHOLECYSTECTOMY  2008  . HERNIA REPAIR  2012  . HIATAL HERNIA REPAIR  2012  . WISDOM TOOTH EXTRACTION     OB History  Gravida Para Term Preterm AB Living  '13 10 8 2 2 10  '$ SAB TAB Ectopic Multiple Live Births  2     0 10    # Outcome Date GA Lbr Len/2nd Weight Sex Delivery Anes PTL Lv  13 Current           12 Term 07/18/18 313w4d4:11 6 lb 14.4 oz (3.13 kg) M Vag-Spont EPI  LIV  11 Term 03/27/17 3932w0d:21 / 00:02 8 lb 9.2 oz (3.89 kg) F Vag-Spont None  LIV  10 Term 11/15/14 38w68w1d17 / 00:07  8 lb 6.2 oz (3.805 kg) F Vag-Spont Local  LIV  9 Term 10/05/13 38w522w5d0 / 00:02 6 lb 7.5 oz (2.934 kg) F Vag-Spont None  LIV  8 SAB 07/09/12 45w0d322w0d   7 Term 01/23/10 [redacted]w[redacted]d 35w6d6 lb 13.7 oz (3.11 kg) F Vag-Spont EPI  LIV     Birth Comments: System Generated. Please review and update pregnancy details.  6 Preterm 02/08/09 [redacted]w[redacted]d 190w2d lb 9.5 oz (2.99 kg) F Vag-Spont   LIV  5 SAB 2008 [redacted]w[redacted]d    16w0d4 Preterm 02/21/04 [redacted]w[redacted]d 05:3w2db 5 oz (2.863 kg) F Vag-Spont EPI  LIV  3 Term 08/2002 [redacted]w[redacted]d 21:027w0d 15 oz (4.054 kg) M Vag-Spont EPI  LIV  2 Term 04/1999 322w0d 05:0033w0d9 oz (3.43 kg) M Vag-Spont EPI  LIV  1 Term 11/1997 322w0d 12:30 [redacted]w[redacted]d oz (3.345 kg) F Vag-Spont EPI  LIV   Social History   Socioeconomic History  . Marital status: Married  Spouse name: Aliou Bia  . Number of children: 9  . Years of education: 65  . Highest education level: Not on file  Occupational History  . Occupation: [employer]  Social Needs  . Financial resource strain: Not hard at all  . Food insecurity    Worry: Never true    Inability: Never true  . Transportation needs    Medical: No    Non-medical: No  Tobacco Use  . Smoking status: Never Smoker  . Smokeless tobacco: Never Used  Substance and Sexual Activity  . Alcohol use: No  . Drug use: Yes    Types: Oxycodone    Comment: per pt prescribed by physician for chronic back pain  . Sexual activity: Yes    Partners: Male  Lifestyle  . Physical activity    Days per week: 0 days    Minutes per session: 0 min  . Stress: To some extent  Relationships  . Social connections    Talks on phone: More than three times a week    Gets together: Twice a week    Attends religious service: More than 4 times per year    Active member of club or organization: Yes    Attends meetings of clubs or organizations: 1 to 4 times per year    Relationship status: Married  Other Topics Concern  . Not on file  Social History Narrative  . Not on file    Family History  Problem Relation Age of Onset  . Diabetes Maternal Grandmother   . Hypertension Maternal Grandmother   . Anemia Mother     Current Outpatient Medications:  .  cyclobenzaprine (FLEXERIL) 10 MG tablet, Take 10 mg by mouth 3 (three) times daily as needed for muscle spasms., Disp: , Rfl:  .  oxyCODONE (ROXICODONE) 15 MG immediate release tablet, Take 10 mg by mouth every 4 (four) hours as needed for pain., Disp: , Rfl:  .  Prenatal-Fe Fum-Methf-FA w/o A (VITAFOL-NANO) 18-0.6-0.4 MG TABS, Take 1 tablet by mouth daily., Disp: 30 tablet, Rfl: 12 .  zolpidem (AMBIEN) 5 MG tablet, Take 5 mg by mouth at bedtime as needed for sleep., Disp: , Rfl:   Current Facility-Administered Medications:  .  HYDROXYprogesterone Caproate SOAJ 275 mg, 275 mg, Subcutaneous, Once, Sloan Leiter, MD  Allergies  Allergen Reactions  . Subutex [Buprenorphine] Itching  . Vicodin [Hydrocodone-Acetaminophen] Itching   Review of Systems: Negative except for what is mentioned in HPI.  Objective:   Vitals:   11/06/19 1441  BP: 132/77  Pulse: 66  Weight: 273 lb (123.8 kg)   Fetal Status: Fetal Heart Rate (bpm): 158         Physical Exam: BP 132/77   Pulse 66   Wt 273 lb (123.8 kg)   BMI 44.06 kg/m  CONSTITUTIONAL: Well-developed, well-nourished female in no acute distress.  NEUROLOGIC: Alert and oriented to person, place, and time. Normal reflexes, muscle tone coordination. No cranial nerve deficit noted. PSYCHIATRIC: Normal mood and affect. Normal behavior. Normal judgment and thought content. SKIN: Skin is warm and dry. No rash noted. Not diaphoretic. No erythema. No pallor. HENT:  Normocephalic, atraumatic, External right and left ear normal. Oropharynx is clear and moist EYES: Conjunctivae and EOM are normal. Pupils are equal, round, and reactive to light. No scleral icterus.  NECK: Normal range of motion, supple, no masses CARDIOVASCULAR: Normal heart rate noted, regular rhythm  RESPIRATORY: Effort and breath sounds normal, no problems with respiration noted BREASTS: symmetric, non-tender,  no masses palpable ABDOMEN: Soft, nontender, nondistended, gravid. GU: normal appearing external female genitalia, multiparous normal appearing cervix, scant white discharge in vagina, no lesions noted Bimanual: 12 weeks sized uterus, no adnexal tenderness or palpable lesions noted MUSCULOSKELETAL: Normal range of motion. EXT:  No edema and no tenderness. 2+ distal pulses.  Assessment and Plan:  Pregnancy: [id number] at 13w6dby 1st trim [place] 1. Supervision of high risk pregnancy in first trimester - Obstetric Panel, Including HIV - [facility]ulatory referral to [facility]- Cytology - PAP( [facility]) - Genetic Screening - Culture, OB Urine  2. Hx of preterm delivery x 2, currently pregnant Accept Makena  3. Hx gestational diabetes x 3 Dx with T2DM in last pregnancy by early 2 hr GTT at 15 weeks - Hemoglobin A1c - Comp Met (CMET)  4. [place]  5. Multigravida of advanced maternal age in first trimester - Mammogram Screening Bilateral Tomo; Future  6. Depression affecting pregnancy, antepartum - Was on wellbutrin and prozac prior to pregnancy but stopped taking them due to not feeling like they were working - has a lot of anxiety around her son, who is in jail - states she is having a lot of anxiety, "cannot turn her thoughts off", feels like she is having "panic attacks" - [facility]. Substance use Takes oxycodone for chronic pain, 3x daily Managed by [facility] Reviewed risks to fetus with chronic opioid use, she verbalizes understanding   Preterm labor symptoms and general obstetric precautions including but not limited to vaginal bleeding, contractions, leaking of fluid and fetal movement were reviewed in detail with the patient.  Please refer to After Visit Summary for other counseling recommendations.   Return in about  4 weeks (around 12/04/2019) for high OB, in person.  KSloan Leiter12/12/2018 10:36 AM

## 2019-11-07 ENCOUNTER — Other Ambulatory Visit (HOSPITAL_COMMUNITY)
Admission: RE | Admit: 2019-11-07 | Discharge: 2019-11-07 | Disposition: A | Discharge status: Home / Self Care | Payer: Medicaid Other | Source: Ambulatory Visit | Attending: Obstetrics and Gynecology | Admitting: Obstetrics and Gynecology

## 2019-11-07 DIAGNOSIS — O0991 Supervision of high risk pregnancy, unspecified, first trimester: Secondary | ICD-10-CM | POA: Diagnosis not present

## 2019-11-07 DIAGNOSIS — O0990 Supervision of high risk pregnancy, unspecified, unspecified trimester: Secondary | ICD-10-CM | POA: Insufficient documentation

## 2019-11-08 LAB — OBSTETRIC PANEL, INCLUDING HIV
Antibody Screen: NEGATIVE
Basophils Absolute: 0 10*3/uL (ref 0.0–0.2)
Basos: 1 %
EOS (ABSOLUTE): 0.1 10*3/uL (ref 0.0–0.4)
Eos: 1 %
HIV Screen 4th Generation wRfx: NONREACTIVE
Hematocrit: 38.7 % (ref 34.0–46.6)
Hemoglobin: 12.5 g/dL (ref 11.1–15.9)
Hepatitis B Surface Ag: NEGATIVE
Immature Grans (Abs): 0 10*3/uL (ref 0.0–0.1)
Immature Granulocytes: 0 %
Lymphocytes Absolute: 2.2 10*3/uL (ref 0.7–3.1)
Lymphs: 30 %
MCH: 27.8 pg (ref 26.6–33.0)
MCHC: 32.3 g/dL (ref 31.5–35.7)
MCV: 86 fL (ref 79–97)
Monocytes Absolute: 0.4 10*3/uL (ref 0.1–0.9)
Monocytes: 5 %
Neutrophils Absolute: 4.5 10*3/uL (ref 1.4–7.0)
Neutrophils: 63 %
Platelets: 304 10*3/uL (ref 150–450)
RBC: 4.5 x10E6/uL (ref 3.77–5.28)
RDW: 13.8 % (ref 11.7–15.4)
RPR Ser Ql: NONREACTIVE
Rh Factor: POSITIVE
Rubella Antibodies, IGG: 2.74 index (ref >0.99)
WBC: 7.2 10*3/uL (ref 3.4–10.8)

## 2019-11-08 LAB — COMPREHENSIVE METABOLIC PANEL
ALT: 17 IU/L (ref 0–32)
AST: 15 IU/L (ref 0–40)
Albumin/Globulin Ratio: 1.6 (ref 1.2–2.2)
Albumin: 3.8 g/dL (ref 3.8–4.8)
Alkaline Phosphatase: 71 IU/L (ref 39–117)
BUN/Creatinine Ratio: 11 (ref 9–23)
BUN: 5 mg/dL — ABNORMAL LOW (ref 6–24)
Bilirubin Total: 0.3 mg/dL (ref 0.0–1.2)
CO2: 19 mmol/L — ABNORMAL LOW (ref 20–29)
Calcium: 9 mg/dL (ref 8.7–10.2)
Chloride: 105 mmol/L (ref 96–106)
Creatinine, Ser: 0.46 mg/dL — ABNORMAL LOW (ref 0.57–1.00)
GFR calc Af Amer: 143 mL/min/{1.73_m2} (ref >59)
GFR calc non Af Amer: 124 mL/min/{1.73_m2} (ref >59)
Globulin, Total: 2.4 g/dL (ref 1.5–4.5)
Glucose: 96 mg/dL (ref 65–99)
Potassium: 4 mmol/L (ref 3.5–5.2)
Sodium: 137 mmol/L (ref 134–144)
Total Protein: 6.2 g/dL (ref 6.0–8.5)

## 2019-11-08 LAB — HEMOGLOBIN A1C
Est. average glucose Bld gHb Est-mCnc: 108 mg/dL
Hgb A1c MFr Bld: 5.4 % (ref 4.8–5.6)

## 2019-11-08 LAB — CERVICOVAGINAL ANCILLARY ONLY
Bacterial Vaginitis (gardnerella): POSITIVE — AB
Candida Glabrata: NEGATIVE
Candida Vaginitis: NEGATIVE
Chlamydia: NEGATIVE
Comment: NEGATIVE
Comment: NEGATIVE
Comment: NEGATIVE
Comment: NEGATIVE
Comment: NEGATIVE
Comment: NORMAL
Neisseria Gonorrhea: NEGATIVE
Trichomonas: NEGATIVE

## 2019-11-08 LAB — URINE CULTURE, OB REFLEX: Organism ID, Bacteria: NO GROWTH

## 2019-11-08 LAB — CULTURE, OB URINE

## 2019-11-08 LAB — TSH: TSH: 1.41 u[IU]/mL (ref 0.450–4.500)

## 2019-11-09 LAB — CYTOLOGY - PAP
Comment: NEGATIVE
Diagnosis: NEGATIVE
High risk HPV: NEGATIVE

## 2019-11-13 ENCOUNTER — Other Ambulatory Visit: Payer: Self-pay

## 2019-11-13 ENCOUNTER — Encounter: Payer: Self-pay | Admitting: Obstetrics and Gynecology

## 2019-11-13 DIAGNOSIS — N760 Acute vaginitis: Secondary | ICD-10-CM

## 2019-11-13 DIAGNOSIS — B9689 Other specified bacterial agents as the cause of diseases classified elsewhere: Secondary | ICD-10-CM

## 2019-11-13 MED ORDER — METRONIDAZOLE 0.75 % VA GEL: 1.0000 | Freq: Every day | VAGINAL | 1 refills | Status: DC

## 2019-11-13 NOTE — Progress Notes (Signed)
Metrogel sent to pt's pharmacy per protocol for BV symptoms. Pt made aware.

## 2019-11-14 NOTE — BH Specialist Note (Signed)
Integrated Behavioral Health Initial Visit  MRN: [id number] Name: Shelly Lane Dia  Number of [place] Clinician visits:: 1/6 (2 total) Session Start time: 2:25  Session End time: 3:33 Total time: 48  Type of Service: [place] Interpretor:No. Interpretor Name and Language: n/a    Warm Hand Off Completed.       SUBJECTIVE: Shelly Lane is a 43 y.o. female accompanied by Adult daughter Patient was referred by Vivien Rota, MD for situational anxiety. Patient reports the following symptoms/concerns: Pt states her primary concern is escalating anxiety, attributed to stress over son's behavior and actions; pt feels it is helpful to talk through her feelings today.  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 Reduce symptoms of: anxiety and stress 2. Increase knowledge and/or ability of: stress reduction  3. Demonstrate ability to: Increase adequate support systems for patient/family  INTERVENTIONS: Interventions utilized: Supportive Counseling, Psychoeducation and/or Health Education and Link to [employer]  Standardized Assessments completed: Not given today  ASSESSMENT: Patient currently experiencing Adjustment disorder with anxiety and Psychosocial stress.   Patient may benefit from psychoeducation and brief therapeutic interventions regarding coping with symptoms of anxiety and life stress .  PLAN: 1. Follow up with behavioral health clinician on : Two  weeks 2. Behavioral recommendations:  -Consider community resources to share with son (IRC, Mobile Crisis, PCP, Other behavioral health local options) -Consider contacting NAMI group for additional family support 3. Referral(s): [place] (In Clinic) and [employer] Resources:  See above(IRC, [facility] options) 4. "From scale of 1-10, how likely are you to follow plan?": -  Caroleen Hamman McMannes, LCSW

## 2019-11-16 ENCOUNTER — Ambulatory Visit (INDEPENDENT_AMBULATORY_CARE_PROVIDER_SITE_OTHER): Payer: Medicaid Other | Admitting: Clinical

## 2019-11-16 DIAGNOSIS — F4322 Adjustment disorder with anxiety: Secondary | ICD-10-CM | POA: Diagnosis not present

## 2019-11-16 DIAGNOSIS — Z658 Other specified problems related to psychosocial circumstances: Secondary | ICD-10-CM | POA: Diagnosis not present

## 2019-11-16 NOTE — Patient Instructions (Addendum)
Housing Resources                    [name] (serves [place], [place], [place], [place], [place], [place], [place], [place], [place], [place], [place], [place], and [place] counties) [address] [place], [place] [address] [phone] DeveloperU.ch  **Rental assistance, Home Rehabilitation,Weatherization Assistance Program, Chief Financial Officer, Housing Voucher Program   Housing Resources [facility]  Housing Authority- [place] [address] [place], [place] [address] [phone] www.gha-Hamler.Bhc [facility] [address] Tawny Hopping [place], [place] [address] [phone] PhoneCaptions.ch **Programs include: Hospital doctor and Housing Counseling, Healthy Radiographer, therapeutic, Homeless Prevention and Housing Assistance  Government [facility] [address], [place], [place] [address] [phone] www.PaintingEmporium.co.za **housing applications/recertification; tax payment relief/exemption under specific qualifications  [facility] [address] [place], [place] [address] www.online[place].com/~maryshouse **transitional housing for women in recovery who have minor children or are pregnant  [facility] [address] [place], [place], [place] [address] https://johnson-smith.net/  **emergency shelter and support services for families facing homelessness  [facility] [address], [place], [place] [address] [phone] www.youthfocus.org **transitional housing to pregnant women; emergency housing for youth who have run away, are experiencing a family crisis, are victims of abuse or neglect, or are homeless  [employer] [address], [place], [place] [address] [phone] ircgso.org **Drop-in center for people experiencing homelessness; overnight warming center when temperature is 25 degrees or  below  [facility] [address] [place], [place], [place] [address] [phone] https://reentrystaffingagency.org/ **help with affordable housing to people experiencing homelessness or unemployment due to incarceration  [facility] [address] [place], [place] [address] [phone] www.greensborourbanministry.org  **emergency and transitional housing, rent/mortgage assistance, utility assistance  [facility] [address] [place], [place] [address] [phone] www.salvationarmyofgreens[place].org **emergency and transitional housing  Hab[employer] for [employer] [address], [place], [place] [address] [phone] Www.habitatgreensboro.[facility]   [employer] [address], [place], [place] [address] [phone] https://chshousing.org **Home Ownership/Affordable Housing Program and [facility]  [facility] [employer] [facility] [address], [place], [place] [address] [phone] PaidValue.com.cy **home buyer education courses, foreclosure prevention  [facility] [address] [place], [place] [address] [phone] DefMagazine.is **Environmental Exposure Assessment (investigation of homes where either children or pregnant women with a confirmed elevated blood lead level reside)  [facility] [address] Nat Math [place], [place] [address] [phone] ShowReturn.ca **Home Expense Assistance/Repairs Program; offers home accessibility updates, such as ramps or bars in the bathroom  [facility] [address] [place], [place] [address] [phone] https://www.self-help.org/locations/[place]-branch **Offers credit-building and banking services to people unable to use traditional  banking   Housing Resources [facility]  Housing Authority- [place] [facility] [address] Cinda Quest [place], [place] [address] [phone] ChatRepair.pl   [facility] [address], [place], [place] [address] [phone]  WrestlingMonthly.pl **housing applications/recertification, emergency and transitional housing  [facility] [employer] [address], [place], [place] [address] [phone] www.odm-hp.org  **emergency and permanent housing; rent/mortgage payment assistance  Behavioral Health Resources:   What if I or someone I know is in crisis?  . If you are thinking about harming yourself or having thoughts of suicide, or if you know someone who is, seek help right away.  . Call your doctor or mental health care  provider.  . Call 911 or go to a hospital emergency room to get immediate help, or ask a friend or family member to help you do these things.  . Call the [place] National Suicide Prevention Lifeline's toll-free, 24-hour hotline at 1-800-273-TALK 507-657-5382) or TTY: 1-800-799-4 TTY (316) 393-6888) to talk to a trained counselor.  . If you are in crisis, make sure you are not left alone.   . If someone else is in crisis, make sure he or she is not left alone   24 Hour :   [place] National Suicide Hotline: 813-007-0430  Therapeutic Alternative Mobile Crisis: (620)559-8374   [facility]  [address] [place], [place] [address]  [phone] or ([phone]  Family Service of the [employer] (Domestic Violence, Rape & Victim Assistance)  [phone]  [place]  [address], [place]  [address]   ([phone] or [phone]   [place]: [phone] (8am-4pm) or [phone] (after hours)           [facility], [address] [place], [place] Fax: [phone] www.NailBuddies.ch  *Interpreters available *Accepts Medicaid, Medicare, uninsured  [facility] [facility]   Mon-Fri: 8am-5pm [address] [place], [place] [phone](phone); [phone]) BloggerCourse.com  *Accepts Medicare  [facility] [place]er Mason, Fri: 8am-4pm [address], [place], [place] (phone); ([phone] (fax) TaskTown.es  *[place] Mon-Fri: 9am-5pm  [address], [place], [place] (phone); [phone]  https://www.bond-cox.org/  *Accepts Medicaid  [facility] [address], [place], [place]  SalonLookup.es   Family Services of the [place], 8:30am-12pm/1pm-2:30pm [address] [place], [place] (phone); ([phone] (fax) www.fspcares.org  *Accepts Medicaid, sliding-scale*Bilingual services available  [facility] Mon-Fri, 8am-7pm [place], [place]  [phone](phone); 5030086904) www.famsolutions.org  *Accepts Medicaid *Bilingual services available  [facility] Mon-Fri: 8am-5pm, Saturday by appointment only [place], [place], [place] (phone); ([phone] (fax) www.journeyscounselinggso.com   [facility] [address] [place], [place], [place] www.kellinfoundation.org  *Free & reduced services for uninsured and underinsured individuals *Bilingual services for Spanish-speaking clients 21 and under  [facility], [address], [place], [place]); [phone]) RunningConvention.de  *Bring your own interpreter at first visit *Accepts  Medicare and [facility]  [place] Mon-Fri: 9am-5:30pm [address], [place], [place], [place] (phone), [phone] (fax) After hours crisis line: ([phone] www.neuropsychcarecenter.com  *Accepts Medicare and Medicaid  [employer], 8am-6pm [address], [place], [place] (phone); [phone] (fax) http://presbyteriancounseling.org  *Subsidized costs available  [facility] Mon-Fri: 8am-4pm [address] [place], [place] [phone](phone); [phone]) www.psychotherapeuticservices.com  *Accepts Medicaid  [facility] Same day access hours: Mon-Fri, 8:30-3pm Crisis hours: Mon-Fri, 8am-5pm [place], [place] Same day access hours: Mon-Fri, 8:30-3pm Crisis hours: Mon-Fri, 8am-8pm [address], [place], [place] (phone); [phone] (fax) www.rhahealthservices.org  *Accepts Medicaid and Medicare  [facility], [place], Fri: 9am-9pm Tues, Thurs: 9am-6pm [place], [place], [place] (phone); [phone] (fax) https://ringercenter.com  *(Accepts Medicare and Medicaid; payment plans available)*Bilingual services available  [facility] [address] [place], [place] (phone); [phone] (fax) www.santecounseling.[place] [address], [place], [place], [place]  OmahaConnections.com.pt  *Bilingual services  available  [facility] (Social and Emotional Learning) Mon-Thurs: 8am-8pm [address], [place], [place] [phone] (phone); [phone] (fax) ScrapbookLive.sihttps://theselgroup.com/index.html  *Accepts Medicaid*Bilingual services available  [facility] [address], [place], [place] [phone] (phone) BrotherBig.athttps://serenitycounselingrc.com  *Accepts  Medicaid *Bilingual services available  [facility] Mon-Fri, 9am-4:45pm [address], [place], [place] [phone] (phone); [phone] (fax) http://tlc-counseling.com  *Accepts Medicare  [facility] Mon-Thurs: 8:30-8pm, Fri: 8:30am-7pm [address], [place], [place] (3rd floor) [phone] (phone); [phone] (fax) https://www.warren.info/http://psy.uncg.edu/clinic  *Accepts Medicaid; income-based reduced rates available  [facility] Mon-Fri: 8am-5pm [address], [place], [place] [phone] (phone); [phone] (fax) http://www.wrightscareservices.com  *Accepts Medicaid*Bilingual services available  [facility] [address], [address], [place], [place]  [phone] (phone); [phone] (fax) www.youthfocus.org  *Free emergency housing and clinical services for youth in crisis  [facility] (Mental Health Association of [place])  [address], [place] [phone] www.mhag.org  *Provides direct services to individuals in recovery from mental illness, including support groups, recovery skills classes, and one on one peer support  NAMI [employer](National Alliance on Mental Illness) [place] NAMI helpline: [phone]  https://namiguilford.org  *A community hub for information relating to local resources and services for the friends and families of individuals living alongside a mental health condition, as well as the individuals themselves. Classes and support groups also provided

## 2019-11-17 ENCOUNTER — Other Ambulatory Visit: Payer: Medicaid Other

## 2019-11-17 ENCOUNTER — Encounter: Payer: Self-pay | Admitting: Obstetrics and Gynecology

## 2019-11-19 ENCOUNTER — Encounter: Payer: Self-pay | Admitting: Obstetrics and Gynecology

## 2019-11-19 DIAGNOSIS — D563 Thalassemia minor: Secondary | ICD-10-CM | POA: Insufficient documentation

## 2019-11-19 HISTORY — DX: Thalassemia minor: D56.3

## 2019-11-22 ENCOUNTER — Other Ambulatory Visit: Payer: Medicaid Other

## 2019-11-23 NOTE — BH Specialist Note (Signed)
Integrated Behavioral Health via Telemedicine Video Visit  Patient and/or legal guardian verbally consented to [facility] services about presenting concerns and psychiatric consultation as appropriate.    11/23/2019 Shelly Lane [id number]  Number of Integrated Behavioral Health visits: 2 Session Start time: 3:17  Session End time: 4:07 Total time: 50   Referring Provider: Vivien Rota, MD Type of Visit: Video Patient/Family location: Home [facility] Provider location: [facility] All persons participating in visit: Patient Shelly Lane and [place]    Confirmed patient's address: Yes  Confirmed patient's phone number: Yes  Any changes to demographics: No   Confirmed patient's insurance: Yes  Any changes to patient's insurance: No   Discussed confidentiality: At previous visit  I connected with Shelly Lane  by a video enabled telemedicine application and verified that I am speaking with the correct person using two identifiers.     I discussed the limitations of evaluation and management by telemedicine and the availability of in person appointments.  I discussed that the purpose of this visit is to provide behavioral health care while limiting exposure to the novel coronavirus.   Discussed there is a possibility of technology failure and discussed alternative modes of communication if that failure occurs.  I discussed that engaging in this video visit, they consent to the provision of behavioral healthcare and the services will be billed under their insurance.  Patient and/or legal guardian expressed understanding and consented to video visit: Yes   PRESENTING CONCERNS: Patient and/or family reports the following symptoms/concerns: Pt states her primary symptoms are anger,crying, anxiety, panic attacks, depression. Pt was previously on Wellbutrin, but no longer working; neither Zoloft nor Prozac decreased anxiety. Pt requesting medication safe  in pregnancy/breastfeeding, and open to implementing self-coping strategy today.  Duration of problem: Increase in over one month; Severity of problem: moderately severe  STRENGTHS (Protective Factors/Coping Skills): Supportive family and friends; open to treatment  GOALS ADDRESSED: Patient will: 1.  Reduce symptoms of: anxiety and stress  2.  Increase knowledge and/or ability of: coping skills and stress reduction  3.  Demonstrate ability to: Increase healthy adjustment to current life circumstances  INTERVENTIONS: Interventions utilized:  Brief CBT Standardized Assessments completed: Not given today  ASSESSMENT: Patient currently experiencing Anxiety disorder, unspecified and Psychosocial stress.   Patient may benefit from continued psychoeducation and brief therapeutic interventions regarding coping with symptoms of anxiety with panic and life stress .  PLAN: 1. Follow up with behavioral health clinician on : One week 2. Behavioral recommendations:  -Continue taking prenatal vitamin -Begin implementing Worry Time strategy today -Continue to consider contacting NAMI group local for additional family support 3. Referral(s): [place] (In Clinic)  I discussed the assessment and treatment plan with the patient and/or parent/guardian. They were provided an opportunity to ask questions and all were answered. They agreed with the plan and demonstrated an understanding of the instructions.   They were advised to call back or seek an in-person evaluation if the symptoms worsen or if the condition fails to improve as anticipated.  Caroleen Hamman Adreana Coull

## 2019-11-29 ENCOUNTER — Other Ambulatory Visit: Payer: Self-pay

## 2019-11-29 ENCOUNTER — Other Ambulatory Visit: Payer: Medicaid Other

## 2019-11-29 DIAGNOSIS — O0990 Supervision of high risk pregnancy, unspecified, unspecified trimester: Secondary | ICD-10-CM

## 2019-11-30 LAB — CBC
Hematocrit: 37.1 % (ref 34.0–46.6)
Hemoglobin: 12.1 g/dL (ref 11.1–15.9)
MCH: 28.5 pg (ref 26.6–33.0)
MCHC: 32.6 g/dL (ref 31.5–35.7)
MCV: 87 fL (ref 79–97)
Platelets: 280 10*3/uL (ref 150–450)
RBC: 4.25 x10E6/uL (ref 3.77–5.28)
RDW: 14.5 % (ref 11.7–15.4)
WBC: 9.8 10*3/uL (ref 3.4–10.8)

## 2019-11-30 LAB — GLUCOSE TOLERANCE, 2 HOURS W/ 1HR
Glucose, 1 hour: 171 mg/dL (ref 65–179)
Glucose, 2 hour: 110 mg/dL (ref 65–152)
Glucose, Fasting: 104 mg/dL — ABNORMAL HIGH (ref 65–91)

## 2019-11-30 LAB — RPR: RPR Ser Ql: NONREACTIVE

## 2019-11-30 LAB — HIV ANTIBODY (ROUTINE TESTING W REFLEX): HIV Screen 4th Generation wRfx: NONREACTIVE

## 2019-12-04 ENCOUNTER — Ambulatory Visit (INDEPENDENT_AMBULATORY_CARE_PROVIDER_SITE_OTHER): Payer: Medicaid Other | Admitting: Clinical

## 2019-12-04 ENCOUNTER — Other Ambulatory Visit: Payer: Self-pay

## 2019-12-04 DIAGNOSIS — Z658 Other specified problems related to psychosocial circumstances: Secondary | ICD-10-CM

## 2019-12-04 DIAGNOSIS — F419 Anxiety disorder, unspecified: Secondary | ICD-10-CM

## 2019-12-05 NOTE — BH Specialist Note (Signed)
Integrated Behavioral Health via Telemedicine Video Visit  12/05/2019 Shelly Lane [id number]  Number of Integrated Behavioral Health visits: 3 Session Start time: 10:52  Session End time: 11:30 Total time: 54  Referring Provider: Leroy Libman, MD Type of Visit: Video Shelly/Family location: Home Shelly Lane Provider location: [facility] All persons participating in visit: Shelly Lane and S[place] Francis Lane [place] [name]    Confirmed Shelly's address: Yes  Confirmed Shelly's phone number: Yes  Any changes to demographics: No   Confirmed Shelly's insurance: Yes  Any changes to Shelly's insurance: No   Discussed confidentiality: At previous visit   I connected with Shelly Lane  by a video enabled telemedicine application and verified that I am speaking with the correct person using two identifiers.     I discussed the limitations of evaluation and management by telemedicine and the availability of in person appointments.  I discussed that the purpose of this visit is to provide behavioral health care while limiting exposure to the novel coronavirus.   Discussed there is a possibility of technology failure and discussed alternative modes of communication if that failure occurs.  I discussed that engaging in this video visit, they consent to the provision of behavioral healthcare and the services will be billed under their insurance.  Shelly and/or legal guardian expressed understanding and consented to video visit: Yes   PRESENTING CONCERNS: Shelly and/or family reports the following symptoms/concerns: Pt states her primary symptoms are lack of interest, depression, sleep difficulty(due to worry and interruptions by children), fatigue, anxiety, excessive worry, difficulty relaxing, and irritability; pt finds it helpful to talk through her feelings.  Duration of problem: Increase in over one month; ongoing; Severity of problem: severe  STRENGTHS (Protective  Factors/Coping Skills): Open to treatment, supportive family members  GOALS ADDRESSED: Shelly will: 1.  Reduce symptoms of: anxiety, depression and stress  2.  Increase knowledge and/or ability of: healthy habits and stress reduction  3.  Demonstrate ability to: Increase healthy adjustment to current life circumstances and Increase adequate support systems for Shelly/family  INTERVENTIONS: Interventions utilized:  Motivational Interviewing, Supportive Counseling and Psychoeducation and/or Health Education Standardized Assessments completed: GAD-7 and PHQ 9  ASSESSMENT: Shelly currently experiencing Anxiety disorder, unspecified and Psychosocial stress  Shelly may benefit from continued psychoeducation and brief therapeutic interventions regarding coping with symptoms of depression, anxiety, and stress .  PLAN: 1. Follow up with behavioral health clinician on : Two weeks 2. Behavioral recommendations:  -Begin taking BH medication as prescribed -Continue taking prenatal vitamins -Continue to prioritize healthy sleep nightly  -Consider establishing with ongoing therapy by 3rd trimester, as discussed(see Resources of therapy options sent to MyChart: [facility]) 3. Referral(s): Integrated [employer] (In Clinic) and Counselor  I discussed the assessment and treatment plan with the Shelly and/or parent/guardian. They were provided an opportunity to ask questions and all were answered. They agreed with the plan and demonstrated an understanding of the instructions.   They were advised to call back or seek an in-person evaluation if the symptoms worsen or if the condition fails to improve as anticipated.  Shelly Lane  Depression screen [facility] 2/9 12/14/2019  Decreased Interest 3  Down, Depressed, Hopeless 3  PHQ - 2 Score 6  Altered sleeping 3  Tired, decreased energy 3  Change in appetite 0  Feeling bad or failure about yourself  1  Trouble concentrating 0   Moving slowly or fidgety/restless 0  Suicidal thoughts 0  PHQ-9 Score 13  Some recent  data might be hidden   GAD 7 : Generalized Anxiety Score 12/14/2019  Nervous, Anxious, on Edge 3  Control/stop worrying 3  Worry too much - different things 3  Trouble relaxing 3  Restless 0  Easily annoyed or irritable 3  Afraid - awful might happen 1  Total GAD 7 Score 16

## 2019-12-06 ENCOUNTER — Other Ambulatory Visit: Payer: Self-pay

## 2019-12-06 ENCOUNTER — Ambulatory Visit (INDEPENDENT_AMBULATORY_CARE_PROVIDER_SITE_OTHER): Payer: Medicaid Other | Admitting: Obstetrics & Gynecology

## 2019-12-06 VITALS — BP 123/82 | HR 108 | Wt 272.7 lb

## 2019-12-06 DIAGNOSIS — O09899 Supervision of other high risk pregnancies, unspecified trimester: Secondary | ICD-10-CM

## 2019-12-06 DIAGNOSIS — O99340 Other mental disorders complicating pregnancy, unspecified trimester: Secondary | ICD-10-CM

## 2019-12-06 DIAGNOSIS — O0992 Supervision of high risk pregnancy, unspecified, second trimester: Secondary | ICD-10-CM

## 2019-12-06 DIAGNOSIS — Z8632 Personal history of gestational diabetes: Secondary | ICD-10-CM

## 2019-12-06 DIAGNOSIS — Z3A17 17 weeks gestation of pregnancy: Secondary | ICD-10-CM

## 2019-12-06 DIAGNOSIS — F329 Major depressive disorder, single episode, unspecified: Secondary | ICD-10-CM

## 2019-12-06 DIAGNOSIS — O99342 Other mental disorders complicating pregnancy, second trimester: Secondary | ICD-10-CM

## 2019-12-06 DIAGNOSIS — O0990 Supervision of high risk pregnancy, unspecified, unspecified trimester: Secondary | ICD-10-CM

## 2019-12-06 DIAGNOSIS — O09529 Supervision of elderly multigravida, unspecified trimester: Secondary | ICD-10-CM

## 2019-12-06 DIAGNOSIS — O09522 Supervision of elderly multigravida, second trimester: Secondary | ICD-10-CM

## 2019-12-06 DIAGNOSIS — F1990 Other psychoactive substance use, unspecified, uncomplicated: Secondary | ICD-10-CM

## 2019-12-06 MED ORDER — HYDROXYPROGESTERONE CAPROATE 275 MG/1.1ML ~~LOC~~ SOAJ: 275.0000 mg | Freq: Once | SUBCUTANEOUS | Status: DC

## 2019-12-06 NOTE — Progress Notes (Signed)
   PRENATAL VISIT NOTE  Subjective:  Shelly Lane is a 43 y.o. [id number] at [redacted]w[redacted]d being seen today for ongoing prenatal care.  She is currently monitored for the following issues for this high-risk pregnancy and has OBESITY; ANXIETY; BULIMIA; Depression affecting pregnancy, antepartum; Advanced maternal age in multigravida; SAB (spontaneous abortion) x 4; [place] multipara; Hx of preterm delivery x 2, currently pregnant; Hx gestational diabetes x 3; Multiple drug allergies; History of abnormal cervical Pap smear; Drug abuse ([place]); DM (diabetes mellitus), type 2 ([place]); Sciatic nerve pain, right; Pregnancy; History of postpartum depression; Substance use; Supervision of high risk pregnancy, antepartum; and Alpha thalassemia silent carrier on their problem list.  Patient reports no complaints.  Contractions: Not present. Vag. Bleeding: None.  Movement: Present. Denies leaking of fluid.   The following portions of the patient's history were reviewed and updated as appropriate: allergies, current medications, past family history, past medical history, past social history, past surgical history and problem list.   Objective:   Vitals:   12/06/19 1441  BP: 123/82  Pulse: (!) 108  Weight: 272 lb 11.2 oz (123.7 kg)    Fetal Status: Fetal Heart Rate (bpm): 150   Movement: Present     General:  Alert, oriented and cooperative. Patient is in no acute distress.  Skin: Skin is warm and dry. No rash noted.   Cardiovascular: Normal heart rate noted  Respiratory: Normal respiratory effort, no problems with respiration noted  Abdomen: Soft, gravid, appropriate for gestational age.  Pain/Pressure: Absent     Pelvic: Cervical exam deferred        Extremities: Normal range of motion.     Mental Status: Normal mood and affect. Normal behavior. Normal judgment and thought content.   Assessment and Plan:  Pregnancy: [id number] at [redacted]w[redacted]d 1. Supervision of high risk pregnancy, antepartum Reports FM Needs  anatomy scan   2. Substance use  3. Hx of preterm delivery x 2, currently pregnant Begin 17-OH-P   4. Hx gestational diabetes x 3 Reviewed results. Pt has monitor but, it does not work. Possible due to no batteries. Pt asked to take this to appt with diabetes educator.   5. Antepartum multigravida of advanced maternal age S/p NIPS AFP today  6. Depression affecting pregnancy, antepartum seeing behavioral med  Preterm labor symptoms and general obstetric precautions including but not limited to vaginal bleeding, contractions, leaking of fluid and fetal movement were reviewed in detail with the patient. Please refer to After Visit Summary for other counseling recommendations.   No follow-ups on file.  Future Appointments  Date Time Provider [place]  12/14/2019 10:45 AM [facility] HEALTH CLINICIAN [facility]    Lavonia Drafts, MD

## 2019-12-06 NOTE — Progress Notes (Signed)
Pt presents for ROB. Pt has no concerns today. 

## 2019-12-08 LAB — AFP, SERUM, OPEN SPINA BIFIDA
AFP MoM: 0.54
AFP Value: 14.6 ng/mL
Gest. Age on Collection Date: 17.1 weeks
Maternal Age At EDD: 41.8 yr
OSBR Risk 1 IN: 10000
Test Results:: NEGATIVE
Weight: 272 [lb_av]

## 2019-12-08 NOTE — L&D Delivery Note (Signed)
OB/GYN Faculty Practice Delivery Note  Shelly Lane is a 44 y.o. [id number] s/p VD at [redacted]w[redacted]d. She was admitted for SOL.   ROM: 2h 58m with clear fluid GBS Status: Negative/-- (05/17 1504) Maximum Maternal Temperature: 98.49F  Labor Progress: . Initial SVE: 5/50/-2. Pitocin started for augmentation and then AROM performed. Received epidural. She then progressed to complete.   Delivery Date/Time: 5/26 @ 0550 Delivery: Called to room and patient was complete and pushing. Head delivered ROP. Nuchal cord present and reduced. Shoulder and body delivered in usual fashion. Infant with spontaneous cry, placed on mother's abdomen, dried and stimulated. Cord clamped x 2 after 1-minute delay, and cut by FOB. Cord blood drawn. Placenta delivered spontaneously with gentle cord traction. Fundus firm with massage and Pitocin. Labia, perineum, vagina, and cervix inspected inspected with no lacerations.  Baby Weight: pending  Placenta: Sent to L&D Complications: None Lacerations: None EBL: 104 mL Analgesia: Epidural   Infant:  APGAR (1 MIN): 9  APGAR (5 MINS): 9  APGAR (10 MINS):     Shelly Birkenhead, MD OB Family Medicine Fellow, [facility] for [facility] 05/01/2020, 6:12 AM

## 2019-12-11 ENCOUNTER — Telehealth: Payer: Self-pay

## 2019-12-11 ENCOUNTER — Encounter: Payer: Self-pay | Admitting: Obstetrics and Gynecology

## 2019-12-11 NOTE — Telephone Encounter (Signed)
Patient called and left detailed message that she has changed her mind and  longer wants to receive 17 P injections and cancelled her recent appt.

## 2019-12-13 ENCOUNTER — Encounter: Payer: Medicaid Other | Attending: Obstetrics & Gynecology | Admitting: Registered"

## 2019-12-13 ENCOUNTER — Other Ambulatory Visit: Payer: Self-pay

## 2019-12-13 ENCOUNTER — Ambulatory Visit: Payer: Medicaid Other

## 2019-12-13 DIAGNOSIS — O24919 Unspecified diabetes mellitus in pregnancy, unspecified trimester: Secondary | ICD-10-CM | POA: Insufficient documentation

## 2019-12-14 ENCOUNTER — Other Ambulatory Visit: Payer: Self-pay | Admitting: Lactation Services

## 2019-12-14 ENCOUNTER — Ambulatory Visit (INDEPENDENT_AMBULATORY_CARE_PROVIDER_SITE_OTHER): Payer: Medicaid Other | Admitting: Clinical

## 2019-12-14 DIAGNOSIS — F419 Anxiety disorder, unspecified: Secondary | ICD-10-CM

## 2019-12-14 DIAGNOSIS — Z658 Other specified problems related to psychosocial circumstances: Secondary | ICD-10-CM | POA: Diagnosis not present

## 2019-12-14 MED ORDER — ACCU-CHEK GUIDE VI STRP: ORAL_STRIP | 12 refills | Status: DC

## 2019-12-14 MED ORDER — ACCU-CHEK FASTCLIX LANCETS MISC: 1.0000 | Freq: Four times a day (QID) | 12 refills | Status: DC

## 2019-12-15 ENCOUNTER — Encounter: Payer: Self-pay | Admitting: Registered"

## 2019-12-15 ENCOUNTER — Telehealth: Payer: Self-pay

## 2019-12-15 DIAGNOSIS — O24919 Unspecified diabetes mellitus in pregnancy, unspecified trimester: Secondary | ICD-10-CM

## 2019-12-15 HISTORY — DX: Unspecified diabetes mellitus in pregnancy, unspecified trimester: O24.919

## 2019-12-15 NOTE — Telephone Encounter (Signed)
TC from pt stating no relief with recent [facility] treatment medication Pt requesting stronger Rx for BV.  Please advise.

## 2019-12-15 NOTE — Progress Notes (Signed)
Patient was seen on 12/13/19 for Gestational Diabetes self-management class at [facility]. The following learning objectives were met by the patient during this course:   States the definition of Gestational Diabetes  States why dietary management is important in controlling blood glucose  Describes the effects each nutrient has on blood glucose levels  Demonstrates ability to create a balanced meal plan  Demonstrates carbohydrate counting   States when to check blood glucose levels  Demonstrates proper blood glucose monitoring techniques  States the effect of stress and exercise on blood glucose levels  States the importance of limiting caffeine and abstaining from alcohol and smoking  Blood glucose monitor given: Accu-chek Guide Me Lot # [id number] Exp: 12/26/20 Blood glucose reading: 99 mg/dL  Patient instructed to monitor glucose levels: FBS: 60 - <95; 1 hour: <140; 2 hour: <120  Patient received handouts:  Nutrition Diabetes and Pregnancy, including carb counting list  Patient will be seen for follow-up as needed.

## 2019-12-18 ENCOUNTER — Telehealth: Payer: Self-pay | Admitting: Clinical

## 2019-12-18 ENCOUNTER — Other Ambulatory Visit: Payer: Self-pay | Admitting: Obstetrics and Gynecology

## 2019-12-18 ENCOUNTER — Other Ambulatory Visit (HOSPITAL_COMMUNITY)
Admission: RE | Admit: 2019-12-18 | Discharge: 2019-12-18 | Disposition: A | Discharge status: Home / Self Care | Payer: Medicaid Other | Source: Ambulatory Visit | Attending: Obstetrics and Gynecology | Admitting: Obstetrics and Gynecology

## 2019-12-18 ENCOUNTER — Other Ambulatory Visit: Payer: Self-pay

## 2019-12-18 ENCOUNTER — Encounter: Payer: Self-pay | Admitting: Obstetrics and Gynecology

## 2019-12-18 ENCOUNTER — Ambulatory Visit (INDEPENDENT_AMBULATORY_CARE_PROVIDER_SITE_OTHER): Payer: Medicaid Other

## 2019-12-18 VITALS — BP 117/73 | HR 98 | Ht 67.0 in | Wt 272.4 lb

## 2019-12-18 DIAGNOSIS — O0990 Supervision of high risk pregnancy, unspecified, unspecified trimester: Secondary | ICD-10-CM

## 2019-12-18 DIAGNOSIS — R102 Pelvic and perineal pain: Secondary | ICD-10-CM | POA: Diagnosis not present

## 2019-12-18 MED ORDER — BLOOD PRESSURE KIT DEVI: 1.0000 | 0 refills | Status: DC

## 2019-12-18 MED ORDER — HYDROXYZINE HCL 10 MG PO TABS: 10.0000 mg | ORAL_TABLET | Freq: Three times a day (TID) | ORAL | 2 refills | Status: DC | PRN

## 2019-12-18 NOTE — Progress Notes (Signed)
Vistaril sent to pharmacy 

## 2019-12-18 NOTE — Telephone Encounter (Signed)
Returned pt's call about not receiving BH medication for anxiety; Dr Earlene Plater is sending in prescription now.

## 2019-12-18 NOTE — Progress Notes (Signed)
SUBJECTIVE:  44 y.o. female complains of vaginal pain and white vaginal discharge for 2 week(s). Denies abnormal vaginal bleeding or significant pelvic pain or fever. No UTI symptoms. Denies history of known exposure to STD.  No LMP recorded. Patient is pregnant.  OBJECTIVE:  She appears well, afebrile. Urine dipstick: not done.  ASSESSMENT:  Vaginal pain Vaginal Discharge     PLAN:  GC, chlamydia, trichomonas, BVAG, CVAG probe sent to lab. Treatment: To be determined once lab results are received ROV prn if symptoms persist or worsen.

## 2019-12-19 ENCOUNTER — Telehealth: Payer: Self-pay | Admitting: Clinical

## 2019-12-19 LAB — CERVICOVAGINAL ANCILLARY ONLY
Bacterial Vaginitis (gardnerella): POSITIVE — AB
Candida Glabrata: NEGATIVE
Candida Vaginitis: POSITIVE — AB
Chlamydia: NEGATIVE
Comment: NEGATIVE
Comment: NEGATIVE
Comment: NEGATIVE
Comment: NEGATIVE
Comment: NEGATIVE
Comment: NORMAL
Neisseria Gonorrhea: NEGATIVE
Trichomonas: NEGATIVE

## 2019-12-19 NOTE — Telephone Encounter (Signed)
Call to pt, 4:29pm - 4:54pm: Pt is uncertain whether her nightmares last night were a side effect of Vistaril or due to stress related to situation with her grown son. Pt will do the following:   1. Stop taking Vistaril today 2. Finish letter to grown son today prior to sleep tonight (get it all out on paper) 3. Call [facility] tomorrow morning for walk-in instructions due to covid, as discussed. Establish care with psychiatry at [facility] for ongoing [facility] medication management 4. Prioritize self-care daily the remainder of the week 5. Virtual follow up with [facility] Russell Quinney on 12/28/19@ 10:45am

## 2019-12-20 ENCOUNTER — Other Ambulatory Visit: Payer: Self-pay | Admitting: Obstetrics

## 2019-12-20 DIAGNOSIS — B373 Candidiasis of vulva and vagina: Secondary | ICD-10-CM

## 2019-12-20 DIAGNOSIS — B9689 Other specified bacterial agents as the cause of diseases classified elsewhere: Secondary | ICD-10-CM

## 2019-12-20 DIAGNOSIS — B3731 Acute candidiasis of vulva and vagina: Secondary | ICD-10-CM

## 2019-12-20 MED ORDER — TERCONAZOLE 0.4 % VA CREA: 1.0000 | TOPICAL_CREAM | Freq: Every day | VAGINAL | 0 refills | Status: DC

## 2019-12-20 MED ORDER — METRONIDAZOLE 500 MG PO TABS: 500.0000 mg | ORAL_TABLET | Freq: Two times a day (BID) | ORAL | 2 refills | Status: DC

## 2019-12-20 MED ORDER — TERCONAZOLE 0.8 % VA CREA: 1.0000 | TOPICAL_CREAM | Freq: Every day | VAGINAL | 0 refills | Status: DC

## 2019-12-25 NOTE — BH Specialist Note (Signed)
Integrated Behavioral Health via Telemedicine Video Visit  12/25/2019 Shelly Lane [id number]  Number of [place] visits: 4 (5 total) Session Start time: 10:51  Session End time: 11:43 Total time: 34  Referring Provider: Vivien Rota, MD Type of Visit: Video Patient/Family location: Home [facility] Provider location: [facility] All persons participating in visit: Patient Shelly Lane and [place]    Confirmed patient's address: Yes  Confirmed patient's phone number: Yes  Any changes to demographics: No   Confirmed patient's insurance: Yes  Any changes to patient's insurance: No   Discussed confidentiality: At previous visit  I connected with Renette Butters Dia  by a video enabled telemedicine application and verified that I am speaking with the correct person using two identifiers.     I discussed the limitations of evaluation and management by telemedicine and the availability of in person appointments.  I discussed that the purpose of this visit is to provide behavioral health care while limiting exposure to the novel coronavirus.   Discussed there is a possibility of technology failure and discussed alternative modes of communication if that failure occurs.  I discussed that engaging in this video visit, they consent to the provision of behavioral healthcare and the services will be billed under their insurance.  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  Duration of problem: Ongoing with increase over one month; Severity of problem: severe  STRENGTHS (Protective Factors/Coping Skills): Supportive  family members; open to treatment   GOALS ADDRESSED: Patient will: 1.  Reduce symptoms of: anxiety, depression and stress  2.  Demonstrate ability to: Increase healthy adjustment to current life circumstances  INTERVENTIONS: Interventions utilized:  Veterinary surgeon and Psychoeducation and/or Health Education Standardized Assessments completed: GAD-7 and PHQ 9  ASSESSMENT: Patient currently experiencing Anxiety disorder, unspecified and Psychosocial stress.   Patient may benefit from continued psychoeducation and brief therapeutic interventions regarding coping with symptoms of anxiety, depression, and life stress.  PLAN: 1. Follow up with behavioral health clinician on : Three weeks 2. 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 Referral(s): [place] (In Clinic)  I discussed the assessment and treatment plan with the patient and/or parent/guardian. They were provided an opportunity to ask questions and all were answered. They agreed with the plan and demonstrated an understanding of the instructions.   They were advised to call back or seek an in-person evaluation if the symptoms worsen or if the condition fails to improve as anticipated.  Caroleen Hamman [place] Hirsch  Depression screen [facility] 2/9 12/28/2019 12/14/2019 12/13/2019  Decreased Interest 1 3 3   Down, Depressed, Hopeless 1 3 3   PHQ - 2 Score 2 6 6   Altered sleeping 1 3 -  Tired, decreased energy 3 3 -  Change in appetite 3 0 -  Feeling bad or failure about yourself  1 1 -  Trouble concentrating 0 0 -  Moving slowly or fidgety/restless 0 0 -  Suicidal thoughts  0 0 -  PHQ-9 Score 10 13 -  Some recent data might be hidden   GAD  7 : Generalized Anxiety Score 12/28/2019 12/14/2019  Nervous, Anxious, on Edge 2 3  Control/stop worrying 3 3  Worry too much - different things 3 3  Trouble relaxing 2 3  Restless 1 0  Easily annoyed or irritable 2 3  Afraid - awful might happen 1 1  Total GAD 7 Score 14 16

## 2019-12-27 ENCOUNTER — Other Ambulatory Visit: Payer: Self-pay

## 2019-12-27 ENCOUNTER — Encounter (HOSPITAL_COMMUNITY): Payer: Self-pay

## 2019-12-27 ENCOUNTER — Ambulatory Visit (HOSPITAL_COMMUNITY)
Admission: RE | Admit: 2019-12-27 | Discharge: 2019-12-27 | Disposition: A | Discharge status: Home / Self Care | Payer: Medicaid Other | Source: Ambulatory Visit | Attending: Obstetrics & Gynecology | Admitting: Obstetrics & Gynecology

## 2019-12-27 ENCOUNTER — Ambulatory Visit (HOSPITAL_COMMUNITY): Payer: Medicaid Other | Admitting: *Deleted

## 2019-12-27 DIAGNOSIS — O0990 Supervision of high risk pregnancy, unspecified, unspecified trimester: Secondary | ICD-10-CM | POA: Insufficient documentation

## 2019-12-27 DIAGNOSIS — O0942 Supervision of pregnancy with grand multiparity, second trimester: Secondary | ICD-10-CM

## 2019-12-27 DIAGNOSIS — O24112 Pre-existing diabetes mellitus, type 2, in pregnancy, second trimester: Secondary | ICD-10-CM

## 2019-12-27 DIAGNOSIS — O99322 Drug use complicating pregnancy, second trimester: Secondary | ICD-10-CM | POA: Diagnosis not present

## 2019-12-27 DIAGNOSIS — Z3A20 20 weeks gestation of pregnancy: Secondary | ICD-10-CM

## 2019-12-27 DIAGNOSIS — O09522 Supervision of elderly multigravida, second trimester: Secondary | ICD-10-CM

## 2019-12-27 DIAGNOSIS — O99212 Obesity complicating pregnancy, second trimester: Secondary | ICD-10-CM

## 2019-12-28 ENCOUNTER — Ambulatory Visit (INDEPENDENT_AMBULATORY_CARE_PROVIDER_SITE_OTHER): Payer: Medicaid Other | Admitting: Clinical

## 2019-12-28 DIAGNOSIS — Z658 Other specified problems related to psychosocial circumstances: Secondary | ICD-10-CM | POA: Diagnosis not present

## 2019-12-28 DIAGNOSIS — F419 Anxiety disorder, unspecified: Secondary | ICD-10-CM

## 2019-12-30 ENCOUNTER — Inpatient Hospital Stay (HOSPITAL_COMMUNITY)
Admission: EM | Admit: 2019-12-30 | Discharge: 2019-12-30 | Disposition: A | Discharge status: Home / Self Care | Payer: Medicaid Other | Attending: Obstetrics & Gynecology | Admitting: Obstetrics & Gynecology

## 2019-12-30 ENCOUNTER — Other Ambulatory Visit: Payer: Self-pay

## 2019-12-30 ENCOUNTER — Encounter (HOSPITAL_COMMUNITY): Payer: Self-pay | Admitting: Emergency Medicine

## 2019-12-30 DIAGNOSIS — Y92410 Unspecified street and highway as the place of occurrence of the external cause: Secondary | ICD-10-CM | POA: Diagnosis not present

## 2019-12-30 DIAGNOSIS — O9A212 Injury, poisoning and certain other consequences of external causes complicating pregnancy, second trimester: Secondary | ICD-10-CM | POA: Diagnosis not present

## 2019-12-30 DIAGNOSIS — O0990 Supervision of high risk pregnancy, unspecified, unspecified trimester: Secondary | ICD-10-CM | POA: Diagnosis not present

## 2019-12-30 DIAGNOSIS — Z3A20 20 weeks gestation of pregnancy: Secondary | ICD-10-CM | POA: Diagnosis not present

## 2019-12-30 LAB — COMPREHENSIVE METABOLIC PANEL
ALT: 15 U/L (ref 0–44)
AST: 15 U/L (ref 15–41)
Albumin: 3.2 g/dL — ABNORMAL LOW (ref 3.5–5.0)
Alkaline Phosphatase: 48 U/L (ref 38–126)
Anion gap: 9 (ref 5–15)
BUN: 8 mg/dL (ref 6–20)
CO2: 22 mmol/L (ref 22–32)
Calcium: 9.4 mg/dL (ref 8.9–10.3)
Chloride: 105 mmol/L (ref 98–111)
Creatinine, Ser: 0.56 mg/dL (ref 0.44–1.00)
GFR calc Af Amer: >60 mL/min (ref >60)
GFR calc non Af Amer: >60 mL/min (ref >60)
Glucose, Bld: 100 mg/dL — ABNORMAL HIGH (ref 70–99)
Potassium: 3.6 mmol/L (ref 3.5–5.1)
Sodium: 136 mmol/L (ref 135–145)
Total Bilirubin: 0.4 mg/dL (ref 0.3–1.2)
Total Protein: 6.3 g/dL — ABNORMAL LOW (ref 6.5–8.1)

## 2019-12-30 LAB — CBC
HCT: 37.7 % (ref 36.0–46.0)
Hemoglobin: 12.2 g/dL (ref 12.0–15.0)
MCH: 29 pg (ref 26.0–34.0)
MCHC: 32.4 g/dL (ref 30.0–36.0)
MCV: 89.5 fL (ref 80.0–100.0)
Platelets: 296 10*3/uL (ref 150–400)
RBC: 4.21 MIL/uL (ref 3.87–5.11)
RDW: 15 % (ref 11.5–15.5)
WBC: 10.9 10*3/uL — ABNORMAL HIGH (ref 4.0–10.5)
nRBC: 0 % (ref 0.0–0.2)
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
## 2019-12-30 NOTE — MAU Note (Signed)
PT FEELS BABY MOVING IN TRIAGE

## 2019-12-30 NOTE — ED Notes (Signed)
Pt called for triage. Registration informed tech that she is over in peds with a pt.
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
## 2019-12-30 NOTE — ED Notes (Signed)
Patient screened by Melvenia Beam PA at triage, advised triage RN that patient can be transferred to MAU , MAU RN / transporter notified .

## 2020-01-02 ENCOUNTER — Other Ambulatory Visit: Payer: Self-pay

## 2020-01-02 ENCOUNTER — Encounter: Payer: Self-pay | Admitting: Obstetrics and Gynecology

## 2020-01-02 ENCOUNTER — Ambulatory Visit (INDEPENDENT_AMBULATORY_CARE_PROVIDER_SITE_OTHER): Payer: Medicaid Other | Admitting: Obstetrics and Gynecology

## 2020-01-02 VITALS — BP 113/77 | HR 111 | Wt 273.0 lb

## 2020-01-02 DIAGNOSIS — Z8759 Personal history of other complications of pregnancy, childbirth and the puerperium: Secondary | ICD-10-CM

## 2020-01-02 DIAGNOSIS — O24919 Unspecified diabetes mellitus in pregnancy, unspecified trimester: Secondary | ICD-10-CM

## 2020-01-02 DIAGNOSIS — O24912 Unspecified diabetes mellitus in pregnancy, second trimester: Secondary | ICD-10-CM

## 2020-01-02 DIAGNOSIS — O0990 Supervision of high risk pregnancy, unspecified, unspecified trimester: Secondary | ICD-10-CM

## 2020-01-02 DIAGNOSIS — D563 Thalassemia minor: Secondary | ICD-10-CM

## 2020-01-02 DIAGNOSIS — Z8659 Personal history of other mental and behavioral disorders: Secondary | ICD-10-CM

## 2020-01-02 DIAGNOSIS — O09529 Supervision of elderly multigravida, unspecified trimester: Secondary | ICD-10-CM

## 2020-01-02 DIAGNOSIS — Z3A21 21 weeks gestation of pregnancy: Secondary | ICD-10-CM

## 2020-01-02 DIAGNOSIS — G8921 Chronic pain due to trauma: Secondary | ICD-10-CM

## 2020-01-02 DIAGNOSIS — O09522 Supervision of elderly multigravida, second trimester: Secondary | ICD-10-CM

## 2020-01-02 DIAGNOSIS — O0992 Supervision of high risk pregnancy, unspecified, second trimester: Secondary | ICD-10-CM
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
## 2020-01-02 NOTE — Addendum Note (Signed)
Addended by: Marya Landry D on: 01/02/2020 11:14 AM   Modules accepted: Orders

## 2020-01-11 ENCOUNTER — Encounter (HOSPITAL_COMMUNITY): Payer: Self-pay

## 2020-01-15 ENCOUNTER — Other Ambulatory Visit: Payer: Self-pay

## 2020-01-15 DIAGNOSIS — B3731 Acute candidiasis of vulva and vagina: Secondary | ICD-10-CM

## 2020-01-15 DIAGNOSIS — B373 Candidiasis of vulva and vagina: Secondary | ICD-10-CM

## 2020-01-15 MED ORDER — TERCONAZOLE 0.8 % VA CREA: 1.0000 | TOPICAL_CREAM | Freq: Every day | VAGINAL | 0 refills | Status: DC

## 2020-01-15 NOTE — BH Specialist Note (Signed)
Pt did not arrive to video visit and did not answer the phone ; Left HIPPA-compliant message to call back Asher Muir from [facility] at [phone].  ; left MyChart message for patient.    Integrated Behavioral Health via Telemedicine Video Visit  01/15/2020 Shelly Lane [id number]   Rae Lips

## 2020-01-16 ENCOUNTER — Other Ambulatory Visit (HOSPITAL_COMMUNITY)
Admission: RE | Admit: 2020-01-16 | Discharge: 2020-01-16 | Disposition: A | Discharge status: Home / Self Care | Payer: Medicaid Other | Source: Ambulatory Visit | Attending: Obstetrics and Gynecology | Admitting: Obstetrics and Gynecology

## 2020-01-16 ENCOUNTER — Telehealth (INDEPENDENT_AMBULATORY_CARE_PROVIDER_SITE_OTHER): Payer: Medicaid Other | Admitting: Obstetrics and Gynecology

## 2020-01-16 ENCOUNTER — Encounter: Payer: Self-pay | Admitting: Obstetrics and Gynecology

## 2020-01-16 ENCOUNTER — Other Ambulatory Visit: Payer: Self-pay

## 2020-01-16 DIAGNOSIS — N898 Other specified noninflammatory disorders of vagina: Secondary | ICD-10-CM | POA: Insufficient documentation

## 2020-01-16 DIAGNOSIS — O09529 Supervision of elderly multigravida, unspecified trimester: Secondary | ICD-10-CM

## 2020-01-16 DIAGNOSIS — Z3A23 23 weeks gestation of pregnancy: Secondary | ICD-10-CM

## 2020-01-16 DIAGNOSIS — G8921 Chronic pain due to trauma: Secondary | ICD-10-CM

## 2020-01-16 DIAGNOSIS — Z8759 Personal history of other complications of pregnancy, childbirth and the puerperium: Secondary | ICD-10-CM

## 2020-01-16 DIAGNOSIS — O26892 Other specified pregnancy related conditions, second trimester: Secondary | ICD-10-CM

## 2020-01-16 DIAGNOSIS — O0990 Supervision of high risk pregnancy, unspecified, unspecified trimester: Secondary | ICD-10-CM

## 2020-01-16 DIAGNOSIS — O24919 Unspecified diabetes mellitus in pregnancy, unspecified trimester: Secondary | ICD-10-CM

## 2020-01-16 DIAGNOSIS — O09522 Supervision of elderly multigravida, second trimester: Secondary | ICD-10-CM

## 2020-01-16 DIAGNOSIS — O24912 Unspecified diabetes mellitus in pregnancy, second trimester: Secondary | ICD-10-CM

## 2020-01-16 DIAGNOSIS — O0992 Supervision of high risk pregnancy, unspecified, second trimester: Secondary | ICD-10-CM

## 2020-01-16 DIAGNOSIS — Z8659 Personal history of other mental and behavioral disorders: Secondary | ICD-10-CM

## 2020-01-16 MED ORDER — INSULIN NPH (HUMAN) (ISOPHANE) 100 UNIT/ML ~~LOC~~ SUSP: 20.0000 [IU] | Freq: Every day | SUBCUTANEOUS | 3 refills | Status: DC

## 2020-01-16 MED ORDER — ZOLPIDEM TARTRATE 5 MG PO TABS: 5.0000 mg | ORAL_TABLET | Freq: Every evening | ORAL | 1 refills | Status: DC | PRN

## 2020-01-16 NOTE — Progress Notes (Signed)
Virtual ROB   CC: BV and yeast infection pt has been using Flagyl and terazol notes vaginal pain /irritation and burning.pt denies any odor or discharge.    Pt states she needs Rx for Ambien.

## 2020-01-16 NOTE — Addendum Note (Signed)
Addended by: Natale Milch D on: 01/16/2020 03:54 PM   Modules accepted: Orders

## 2020-01-16 NOTE — Progress Notes (Signed)
TELEHEALTH OBSTETRICS PRENATAL VIRTUAL VIDEO VISIT ENCOUNTER NOTE  Provider [facility]: [facility]   I connected with Shelly GamblesLinda Boeckman on 01/16/20 at  1:45 PM EST by MyChart Video Encounter at home and verified that I am speaking with the correct person using two identifiers.   I discussed the limitations, risks, security and privacy concerns of performing an evaluation and management service virtually and the availability of in person appointments. I also discussed with the patient that there may be a patient responsible charge related to this service. The patient expressed understanding and agreed to proceed. Subjective:  Shelly GamblesLinda Lane is a 44 y.o. [id number] at 7093w0d being seen today for ongoing prenatal care.  She is currently monitored for the following issues for this high-risk pregnancy and has OBESITY; ANXIETY; BULIMIA; Depression affecting pregnancy, antepartum; Advanced maternal age in multigravida; SAB (spontaneous abortion) x 4; Grand multipara; Hx of preterm delivery x 2, currently pregnant; Hx gestational diabetes x 3; Multiple drug allergies; History of abnormal cervical Pap smear; DM (diabetes mellitus), type 2 (HCC); Sciatic nerve pain, right; Pregnancy; History of postpartum depression; Chronic pain; Supervision of high risk pregnancy, antepartum; Alpha thalassemia silent carrier; and Diabetes mellitus during pregnancy, antepartum on their problem list.  Patient reports vaginal irritation.  Contractions: Not present. Vag. Bleeding: None.  Movement: Present. Denies any leaking of fluid.   The following portions of the patient's history were reviewed and updated as appropriate: allergies, current medications, past family history, past medical history, past social history, past surgical history and problem list.   Objective:  There were no vitals filed for this visit.  Fetal Status:     Movement: Present     General:  Alert, oriented and cooperative.  Patient is in no acute distress.  Respiratory: Normal respiratory effort, no problems with respiration noted  Mental Status: Normal mood and affect. Normal behavior. Normal judgment and thought content.  Rest of physical exam deferred due to type of encounter  Imaging: US MFM OB DETAIL +14 WK  Result Date: 12/29/2019 ----------------------------------------------------------------------  OBSTETRICS REPORT                    (Corrected Final 12/29/2019 01:45 pm) ---------------------------------------------------------------------- Patient Info  ID #:       [id number]                          D.O.B.:  October 01, 1976 (43 yrs)  Name:       Shelly Lane                 Visit Date: 12/27/2019 02:59 pm ---------------------------------------------------------------------- Performed By  Performed By:     Marcellina MillinKelly L Moser          Ref. Address:     [address]                    [address]                                                             [address]  [address]                                                             [place]                                                             [address]  Attending:        Ma Rings MD         [facility]  Referred By:      [facility] ---------------------------------------------------------------------- Orders   #  Description                          Code         Ordered By   1  [place] MFM OB DETAIL +14 WK              76811.01     Willodean Rosenthal  ----------------------------------------------------------------------   #  Order #                    Accession #                 Episode #   1  [id number]                  [id number]                  [id number]  ---------------------------------------------------------------------- Indications   Pre-existing diabetes,  type 2, in pregnancy,   O24.112   second trimester (diet)   Advanced maternal age multigravida 57+,        O67.523   third trimester (low risk NIPS)   Grand multiparity, antepartum                  O09.40   [redacted] weeks gestation of pregnancy                Z3A.20   Encounter for antenatal screening for          Z36.3   malformations   Preterm labor without delivery, second         O72.02   trimester (x2 @ 36 weeks)   Genetic carrier (alpha thal)  Z14.8   Obesity complicating pregnancy, second         O99.212   trimester  ---------------------------------------------------------------------- Fetal Evaluation  Num Of Fetuses:         1  Fetal Heart Rate(bpm):  155  Cardiac Activity:       Observed  Presentation:           Variable  Placenta:               Anterior  P. Cord Insertion:      Visualized  Amniotic Fluid  AFI FV:      Within normal limits ---------------------------------------------------------------------- Biometry  BPD:      48.7  mm     G. Age:  20w 5d         74  %    CI:        76.05   %    70 - 86                                                          FL/HC:      19.8   %    16.8 - 19.8  HC:       177   mm     G. Age:  20w 1d         44  %    HC/AC:      1.14        1.09 - 1.39  AC:      154.9  mm     G. Age:  20w 5d         62  %    FL/BPD:     72.1   %  FL:       35.1  mm     G. Age:  21w 1d         75  %    FL/AC:      22.7   %    20 - 24  HUM:      33.4  mm     G. Age:  21w 2d         83  %  CER:      21.3  mm     G. Age:  20w 2d         52  %  Est. FW:     377  gm    0 lb 13 oz      80  % ---------------------------------------------------------------------- OB History  Gravidity:    13        Term:   8        Prem:   2        SAB:   2  TOP:          0       Ectopic:  0        Living: 10 ---------------------------------------------------------------------- Gestational Age  U/S Today:     20w 5d                                        EDD:   05/10/20  Best:          Cherylann Parr  1d      Det. By:  Loman Chroman         EDD:   05/14/20                                      (11/01/19) ---------------------------------------------------------------------- Anatomy  Cranium:               Appears normal         Aortic Arch:            Appears normal  Cavum:                 Appears normal         Ductal Arch:            Appears normal  Ventricles:            Appears normal         Diaphragm:              Appears normal  Choroid Plexus:        Appears normal         Stomach:                Appears normal, left                                                                        sided  Cerebellum:            Appears normal         Abdomen:                Appears normal  Posterior Fossa:       Appears normal         Abdominal Wall:         Appears nml (cord                                                                        insert, abd wall)  Nuchal Fold:           Not applicable (>81    Cord Vessels:           Appears normal ([redacted]                         wks GA)                                        vessel cord)  Face:                  Appears normal         Kidneys:                Appear normal                         (  orbits and profile)  Lips:                  Appears normal         Bladder:                Appears normal  Thoracic:              Appears normal         Spine:                  Appears normal  Heart:                 Echogenic focus        Upper Extremities:      Appears normal                         in LV  RVOT:                  Appears normal         Lower Extremities:      Appears normal  LVOT:                  Appears normal  Other:  Fetus appears to be a female. Heels and 5th digit visualized. Open          hands visualized. Nasal bone visualized. ---------------------------------------------------------------------- Cervix Uterus Adnexa  Cervix  Length:            3.8  cm.  Normal appearance by transabdominal scan.  ---------------------------------------------------------------------- Comments  This patient was seen for a detailed fetal anatomy scan due  to advanced maternal age, maternal obesity, and early onset  gestational diabetes versus pregestational diabetes.  The  patient reports that her diabetes is currently being managed  with diet modification.  She denies any other significant past  medical history.  She had a cell free DNA test earlier in her pregnancy which  indicated a low risk for trisomy 61, 12, and 13. A female fetus is  predicted.  She was informed that the fetal growth and amniotic fluid  level were appropriate for her gestational age.  On today's exam, an intracardiac echogenic focus was noted  in the left ventricle of the fetal heart.  The small association  between an echogenic focus and Down syndrome was  discussed. Due to the echogenic focus noted today, the  patient was offered and declined an amniocentesis today for  definitive diagnosis of fetal aneuploidy.  She reports that she  is comfortable with her negative cell free DNA test.  The patient was informed that anomalies may be missed due  to technical limitations. If the fetus is in a suboptimal position  or maternal habitus is increased, visualization of the fetus in  the maternal uterus may be impaired.  Due to early onset gestational diabetes versus pregestational  diabetes, the patient was referred for a fetal echocardiogram  with pediatric cardiology.  We will continue to follow her with serial growth ultrasounds  due to her diabetes.  A follow-up growth scan was scheduled in 4 weeks.  We will  try to complete the views of the fetal anatomy at that time. ----------------------------------------------------------------------                        Ma Rings, MD Electronically Signed Corrected Final Report  12/29/2019 01:45 pm ----------------------------------------------------------------------   Assessment and Plan:  Pregnancy:  [id number] at [redacted]w[redacted]d 1. Supervision of high risk pregnancy, antepartum Patient is doing well without complaints She is requesting rx ambien   2. Diabetes mellitus during pregnancy, antepartum, unspecified diabetes mellitus type Patient reports persistently elevated fasting 90-110 and normal pp Will start NPH at bedtime 20 units. Patient did not want to start with metformin  3. Chronic pain due to trauma   4. Antepartum multigravida of advanced maternal age   47. History of postpartum depression Stable. Followed by behavioral health  Preterm labor symptoms and general obstetric precautions including but not limited to vaginal bleeding, contractions, leaking of fluid and fetal movement were reviewed in detail with the patient. I discussed the assessment and treatment plan with the patient. The patient was provided an opportunity to ask questions and all were answered. The patient agreed with the plan and demonstrated an understanding of the instructions. The patient was advised to call back or seek an in-person office evaluation/go to MAU at [facility] for any urgent or concerning symptoms. Please refer to After Visit Summary for other counseling recommendations.   I provided 11 minutes of face-to-face time during this encounter.  Return in about 3 weeks (around 02/06/2020) for in person, ROB, High risk, labs.  Future Appointments  Date Time Provider Department Center  01/18/2020 10:45 AM [facility] WOC-WOCA WOC  01/24/2020  3:30 PM WH-MFC NURSE WH-MFC MFC-US  01/24/2020  3:30 PM WH-MFC [place] 1 WH-MFCUS MFC-US    Catalina Antigua, MD [facility]

## 2020-01-17 ENCOUNTER — Ambulatory Visit: Payer: Medicaid Other

## 2020-01-17 ENCOUNTER — Other Ambulatory Visit: Payer: Self-pay | Admitting: *Deleted

## 2020-01-17 DIAGNOSIS — B373 Candidiasis of vulva and vagina: Secondary | ICD-10-CM

## 2020-01-17 DIAGNOSIS — B3731 Acute candidiasis of vulva and vagina: Secondary | ICD-10-CM

## 2020-01-17 LAB — CERVICOVAGINAL ANCILLARY ONLY
Bacterial Vaginitis (gardnerella): POSITIVE — AB
Candida Glabrata: NEGATIVE
Candida Vaginitis: NEGATIVE
Comment: NEGATIVE
Comment: NEGATIVE
Comment: NEGATIVE

## 2020-01-17 MED ORDER — TERCONAZOLE 0.8 % VA CREA: 1.0000 | TOPICAL_CREAM | Freq: Every day | VAGINAL | 0 refills | Status: DC

## 2020-01-17 NOTE — Progress Notes (Signed)
Pt states she could not get Terazol 3 cream at pharmacy. Rx was reordered today to verify pharmacy got correct Rx.

## 2020-01-18 ENCOUNTER — Ambulatory Visit: Payer: Medicaid Other | Admitting: Clinical

## 2020-01-18 DIAGNOSIS — Z5329 Procedure and treatment not carried out because of patient's decision for other reasons: Secondary | ICD-10-CM

## 2020-01-18 DIAGNOSIS — Z91199 Patient's noncompliance with other medical treatment and regimen due to unspecified reason: Secondary | ICD-10-CM

## 2020-01-18 MED ORDER — METRONIDAZOLE 500 MG PO TABS: 500.0000 mg | ORAL_TABLET | Freq: Two times a day (BID) | ORAL | 0 refills | Status: DC

## 2020-01-18 NOTE — Addendum Note (Signed)
Addended by: Catalina Antigua on: 01/18/2020 10:22 AM   Modules accepted: Orders

## 2020-01-19 ENCOUNTER — Other Ambulatory Visit: Payer: Self-pay | Admitting: Obstetrics and Gynecology

## 2020-01-19 MED ORDER — INSULIN PEN NEEDLE 31G X 6 MM MISC: 1.0000 | Freq: Every day | 2 refills | Status: DC

## 2020-01-22 ENCOUNTER — Other Ambulatory Visit: Payer: Self-pay | Admitting: Obstetrics and Gynecology

## 2020-01-22 MED ORDER — "INSULIN SYRINGE-NEEDLE U-100 27G X 1/2"" 0.5 ML MISC": 3 refills | Status: DC

## 2020-01-23 ENCOUNTER — Other Ambulatory Visit: Payer: Self-pay | Admitting: *Deleted

## 2020-01-23 DIAGNOSIS — O0990 Supervision of high risk pregnancy, unspecified, unspecified trimester: Secondary | ICD-10-CM

## 2020-01-23 NOTE — Progress Notes (Signed)
Order entered for follow up u/s.

## 2020-01-24 ENCOUNTER — Other Ambulatory Visit: Payer: Self-pay

## 2020-01-24 ENCOUNTER — Encounter (HOSPITAL_COMMUNITY): Payer: Self-pay

## 2020-01-24 ENCOUNTER — Ambulatory Visit (HOSPITAL_COMMUNITY)
Admission: RE | Admit: 2020-01-24 | Discharge: 2020-01-24 | Disposition: A | Discharge status: Home / Self Care | Payer: Medicaid Other | Source: Ambulatory Visit | Attending: Obstetrics and Gynecology | Admitting: Obstetrics and Gynecology

## 2020-01-24 ENCOUNTER — Ambulatory Visit (HOSPITAL_COMMUNITY): Payer: Medicaid Other | Admitting: *Deleted

## 2020-01-24 DIAGNOSIS — Z3A24 24 weeks gestation of pregnancy: Secondary | ICD-10-CM

## 2020-01-24 DIAGNOSIS — O24410 Gestational diabetes mellitus in pregnancy, diet controlled: Secondary | ICD-10-CM

## 2020-01-24 DIAGNOSIS — O0990 Supervision of high risk pregnancy, unspecified, unspecified trimester: Secondary | ICD-10-CM | POA: Diagnosis not present

## 2020-01-24 DIAGNOSIS — Z362 Encounter for other antenatal screening follow-up: Secondary | ICD-10-CM

## 2020-02-01 ENCOUNTER — Other Ambulatory Visit: Payer: Self-pay

## 2020-02-01 ENCOUNTER — Encounter (HOSPITAL_COMMUNITY): Payer: Self-pay | Admitting: Obstetrics and Gynecology

## 2020-02-01 ENCOUNTER — Inpatient Hospital Stay (HOSPITAL_COMMUNITY)
Admission: AD | Admit: 2020-02-01 | Discharge: 2020-02-02 | Disposition: A | Discharge status: Home / Self Care | Payer: Medicaid Other | Source: Ambulatory Visit | Attending: Obstetrics and Gynecology | Admitting: Obstetrics and Gynecology

## 2020-02-01 DIAGNOSIS — E669 Obesity, unspecified: Secondary | ICD-10-CM | POA: Insufficient documentation

## 2020-02-01 DIAGNOSIS — O99612 Diseases of the digestive system complicating pregnancy, second trimester: Secondary | ICD-10-CM | POA: Insufficient documentation

## 2020-02-01 DIAGNOSIS — O26892 Other specified pregnancy related conditions, second trimester: Secondary | ICD-10-CM | POA: Insufficient documentation

## 2020-02-01 DIAGNOSIS — R102 Pelvic and perineal pain: Secondary | ICD-10-CM | POA: Diagnosis not present

## 2020-02-01 DIAGNOSIS — O99891 Other specified diseases and conditions complicating pregnancy: Secondary | ICD-10-CM | POA: Diagnosis not present

## 2020-02-01 DIAGNOSIS — M549 Dorsalgia, unspecified: Secondary | ICD-10-CM | POA: Diagnosis not present

## 2020-02-01 DIAGNOSIS — Z794 Long term (current) use of insulin: Secondary | ICD-10-CM | POA: Insufficient documentation

## 2020-02-01 DIAGNOSIS — Z79899 Other long term (current) drug therapy: Secondary | ICD-10-CM | POA: Diagnosis not present

## 2020-02-01 DIAGNOSIS — O99212 Obesity complicating pregnancy, second trimester: Secondary | ICD-10-CM | POA: Diagnosis not present

## 2020-02-01 DIAGNOSIS — Z3A25 25 weeks gestation of pregnancy: Secondary | ICD-10-CM | POA: Insufficient documentation

## 2020-02-01 DIAGNOSIS — K5900 Constipation, unspecified: Secondary | ICD-10-CM | POA: Diagnosis not present

## 2020-02-01 DIAGNOSIS — O4700 False labor before 37 completed weeks of gestation, unspecified trimester: Secondary | ICD-10-CM

## 2020-02-01 DIAGNOSIS — O4702 False labor before 37 completed weeks of gestation, second trimester: Secondary | ICD-10-CM | POA: Diagnosis not present

## 2020-02-01 DIAGNOSIS — O479 False labor, unspecified: Secondary | ICD-10-CM

## 2020-02-01 DIAGNOSIS — O0990 Supervision of high risk pregnancy, unspecified, unspecified trimester: Secondary | ICD-10-CM

## 2020-02-01 DIAGNOSIS — K5903 Drug induced constipation: Secondary | ICD-10-CM

## 2020-02-01 LAB — URINALYSIS, ROUTINE W REFLEX MICROSCOPIC
Bacteria, UA: NONE SEEN
Bilirubin Urine: NEGATIVE
Glucose, UA: NEGATIVE mg/dL
Hgb urine dipstick: NEGATIVE
Ketones, ur: 20 mg/dL — AB
Nitrite: NEGATIVE
Protein, ur: NEGATIVE mg/dL
Specific Gravity, Urine: 1.011 (ref 1.005–1.030)
pH: 6 (ref 5.0–8.0)

## 2020-02-01 MED ORDER — CYCLOBENZAPRINE HCL 5 MG PO TABS
10.0000 mg | ORAL_TABLET | Freq: Once | ORAL | Status: AC
  Administered 2020-02-01: 10 mg via ORAL
  Filled 2020-02-01: qty 2

## 2020-02-01 MED ORDER — NIFEDIPINE 10 MG PO CAPS
10.0000 mg | ORAL_CAPSULE | ORAL | Status: DC | PRN
  Administered 2020-02-01 – 2020-02-02 (×3): 10 mg via ORAL
  Filled 2020-02-01 (×3): qty 1

## 2020-02-01 NOTE — MAU Note (Signed)
Feeling a lot of pressure in vagina and butt and feels like baby is in my butt.  Wanted to make sure everything is ok. This started on Tuesday.  Contractions not regular.  No leaking.  No bleeding.  Baby moving well.

## 2020-02-01 NOTE — MAU Note (Signed)
Pelvic pressure and pain for 3 days. Denies LOF or VB

## 2020-02-01 NOTE — MAU Provider Note (Signed)
Chief Complaint:  Pelvic Pain   First Provider Initiated Contact with Patient 02/01/20 2300     HPI: Shelly Lane is a 44 y.o. [id number] at 72w2dho presents to maternity admissions reporting contractions and rectal pressure.  Has been going on for 3 days.  Hx PTD x2 at 363 weeks No early preterm labor history. She reports good fetal movement, denies LOF, vaginal bleeding, vaginal itching/burning, urinary symptoms, h/a, dizziness, n/v, diarrhea, constipation or fever/chills  Pelvic Pain The patient's primary symptoms include pelvic pain. The patient's pertinent negatives include no genital itching, genital lesions, genital odor, vaginal bleeding or vaginal discharge. This is a new problem. The current episode started in the past 7 days. The problem occurs intermittently. The problem has been unchanged. The pain is mild. She is pregnant. Associated symptoms include abdominal pain. Pertinent negatives include no back pain, chills, constipation, diarrhea, discolored urine, dysuria, frequency, nausea or vomiting. Nothing aggravates the symptoms. She has tried nothing for the symptoms. There is no history of a Cesarean section.   .RN Note: Pelvic pressure and pain for 3 days. Denies LOF or VB Feeling a lot of pressure in vagina and butt and feels like baby is in my butt.  Wanted to make sure everything is ok. This started on Tuesday.  Contractions not regular.  No leaking.  No bleeding.  Baby moving well.   Past Medical History: Past Medical History:  Diagnosis Date  . Abnormal Pap smear    Colpo;Last pap 2012, ASCUS w/ negative HPV  . Anemia    during preg  . Anxiety   . Eczema   . GERD (gastroesophageal reflux disease)   . Gestational diabetes    diet controlled  . H/O migraine   . H/O rubella   . H/O varicella   . H/O: depression   . H/O: obesity   . Hx: UTI (urinary tract infection)   . Opioid use   . Preterm labor   . Vaginal Pap smear, abnormal     Past obstetric  history: OB History  Gravida Para Term Preterm AB Living  _0 SAB TAB Ectopic Multiple Live Births  2     0 10    # Outcome Date GA Lbr Len/2nd Weight Sex Delivery Anes PTL Lv  13 Current           12 Term 07/18/18 34w4d4:11 3130 g M Vag-Spont EPI  LIV  11 Term 03/27/17 391w0d:21 / 00:02 3890 g F Vag-Spont None  LIV  10 Term 11/15/14 38w43w1d17 / 00:07 3805 g F Vag-Spont Local  LIV  9 Term 10/05/13 38w518w5d0 / 00:02 2934 g F Vag-Spont None  LIV  8 SAB 07/09/12 366w0d68w0d   7 Term 01/23/10 [redacted]w[redacted]d 61w6d3110 g F Vag-Spont EPI  LIV     Birth Comments: System Generated. Please review and update pregnancy details.  6 Preterm 02/08/09 [redacted]w[redacted]d 168w2d990 g F Vag-Spont   LIV  5 SAB 2008 [redacted]w[redacted]d    97w0d4 Preterm 02/21/04 [redacted]w[redacted]d 05:18w2d3 g F Vag-Spont EPI  LIV  3 Term 08/2002 [redacted]w[redacted]d 21:027w0d g M Vag-Spont EPI  LIV  2 Term 04/1999 366w0d 05:0027w0dg M Vag-Spont EPI  LIV  1 Term 11/1997 366w0d 12:30 [redacted]w[redacted]d F Vag-Spont EPI  LIV    Past Surgical History: Past Surgical History:  Procedure Laterality Date  . CHOLECYSTECTOMY  2008  .  HERNIA REPAIR  2012  . HIATAL HERNIA REPAIR  2012  . WISDOM TOOTH EXTRACTION      Family History: Family History  Problem Relation Age of Onset  . Diabetes Maternal Grandmother   . Hypertension Maternal Grandmother   . Anemia Mother     Social History: Social History   Tobacco Use  . Smoking status: Never Smoker  . Smokeless tobacco: Never Used  Substance Use Topics  . Alcohol use: No  . Drug use: Yes    Types: Oxycodone    Comment: per pt prescribed by physician for chronic back pain    Allergies:  Allergies  Allergen Reactions  . Hydroxyzine Hcl Other (See Comments)    "Made me feel like I was going nuts".   . Subutex [Buprenorphine] Itching  . Vicodin [Hydrocodone-Acetaminophen] Itching    Meds:  Facility-Administered Medications Prior to Admission  Medication Dose Route Frequency Provider Last Rate Last Admin  .  HYDROXYprogesterone Caproate SOAJ 275 mg  275 mg Subcutaneous Once Sloan Leiter, MD      . HYDROXYprogesterone Caproate SOAJ 275 mg  275 mg Subcutaneous Once Lavonia Drafts, MD       Medications Prior to Admission  Medication Sig Dispense Refill Last Dose  . Accu-Chek FastClix Lancets MISC 1 Device by Percutaneous route 4 (four) times daily. 100 each 12 02/01/2020 at Unknown time  . glucose blood (ACCU-CHEK GUIDE) test strip Use as instructed 100 each 12 02/01/2020 at Unknown time  . insulin NPH Human (NOVOLIN N) 100 UNIT/ML injection Inject 0.2 mLs (20 Units total) into the skin at bedtime. 10 mL 3 01/31/2020 at Unknown time  . Insulin Pen Needle 31G X 6 MM MISC 1 Syringe by Does not apply route at bedtime. 100 each 2 01/31/2020 at Unknown time  . Insulin Syringe-Needle U-100 27G X 1/2" 0.5 ML MISC Use at bedtime for insulin injection 100 each 3 01/31/2020 at Unknown time  . oxyCODONE (ROXICODONE) 15 MG immediate release tablet Take 10 mg by mouth every 4 (four) hours as needed for pain.   02/01/2020 at Unknown time  . Prenatal-Fe Fum-Methf-FA w/o A (VITAFOL-NANO) 18-0.6-0.4 MG TABS Take 1 tablet by mouth daily. 30 tablet 12 02/01/2020 at Unknown time  . zolpidem (AMBIEN) 5 MG tablet Take 1 tablet (5 mg total) by mouth at bedtime as needed for sleep. 30 tablet 1 01/31/2020 at Unknown time  . Blood Pressure Monitoring (BLOOD PRESSURE KIT) DEVI 1 kit by Does not apply route once a week. Check Blood Pressure regularly and record readings into the [employer] App.  Large Cuff.  DX O90.0 1 each 0   . cyclobenzaprine (FLEXERIL) 10 MG tablet Take 10 mg by mouth 3 (three) times daily as needed for muscle spasms.     . hydrOXYzine (ATARAX/VISTARIL) 10 MG tablet Take 1 tablet (10 mg total) by mouth 3 (three) times daily as needed. (Patient not taking: Reported on 12/27/2019) 30 tablet 2   . metroNIDAZOLE (FLAGYL) 500 MG tablet Take 1 tablet (500 mg total) by mouth 2 (two) times daily. (Patient not taking:  Reported on 01/24/2020) 14 tablet 2   . metroNIDAZOLE (FLAGYL) 500 MG tablet Take 1 tablet (500 mg total) by mouth 2 (two) times daily. (Patient not taking: Reported on 01/24/2020) 14 tablet 0   . OXYCODONE ER PO Take by mouth.     . terconazole (TERAZOL 3) 0.8 % vaginal cream Place 1 applicator vaginally at bedtime. (Patient not taking: Reported on 01/24/2020) 20 g 0   .  zolpidem (AMBIEN) 5 MG tablet Take 5 mg by mouth at bedtime as needed for sleep.       I have reviewed patient's Past Medical Hx, Surgical Hx, Family Hx, Social Hx, medications and allergies.   ROS:  Review of Systems  Constitutional: Negative for chills.  Respiratory: Negative for shortness of breath.   Gastrointestinal: Positive for abdominal pain. Negative for constipation, diarrhea, nausea and vomiting.  Genitourinary: Positive for pelvic pain. Negative for dysuria, frequency and vaginal discharge.  Musculoskeletal: Negative for back pain.   Other systems negative  Physical Exam   Patient Vitals for the past 24 hrs:  BP Temp Temp src Pulse Resp SpO2 Height Weight  02/01/20 2245 -- -- -- -- -- 97 % -- --  02/01/20 2242 126/74 98.6 F (37 C) Oral 100 18 -- -- --  02/01/20 2232 -- -- -- -- -- -- _0  (1.702 m) 122.5 kg   Constitutional: Well-developed, well-nourished female in no acute distress.  Cardiovascular: normal rate and rhythm Respiratory: normal effort, clear to auscultation bilaterally GI: Abd soft, non-tender, gravid appropriate for gestational age.   No rebound or guarding. MS: Extremities nontender, no edema, normal ROM Neurologic: Alert and oriented x 4.  GU: Neg CVAT.  PELVIC EXAM:    Dilation: Fingertip Station: Ballotable Exam by:: [facility] Jaidyn Usery cnm Cervix is long, no effacement  FHT:  Baseline 140 , moderate variability, accelerations present, no decelerations Contractions: q 4 mins Irregular     Many contractions have flattened tops, ?patient holding breath during UC   Labs: Results  for orders placed or performed during the hospital encounter of 02/01/20 (from the past 24 hour(s))  Urinalysis, Routine w reflex microscopic     Status: Abnormal   Collection Time: 02/01/20 10:31 PM  Result Value Ref Range   Color, Urine YELLOW YELLOW   APPearance HAZY (A) CLEAR   Specific Gravity, Urine 1.011 1.005 - 1.030   pH 6.0 5.0 - 8.0   Glucose, UA NEGATIVE NEGATIVE mg/dL   Hgb urine dipstick NEGATIVE NEGATIVE   Bilirubin Urine NEGATIVE NEGATIVE   Ketones, ur 20 (A) NEGATIVE mg/dL   Protein, ur NEGATIVE NEGATIVE mg/dL   Nitrite NEGATIVE NEGATIVE   Leukocytes,Ua TRACE (A) NEGATIVE   RBC / HPF 0-5 0 - 5 RBC/hpf   WBC, UA 0-5 0 - 5 WBC/hpf   Bacteria, UA NONE SEEN NONE SEEN   Squamous Epithelial / LPF 0-5 0 - 5  Fetal fibronectin     Status: None   Collection Time: 02/01/20 11:02 PM  Result Value Ref Range   Fetal Fibronectin NEGATIVE NEGATIVE   O/Positive/-- (11/30 1533)  Imaging:  none  MAU Course/MDM: I have ordered labs and reviewed results.  Fetal fibronectin done.  Resulted as negative NST reviewed, reactive.   Treatments in MAU included Procardia series which did diminish contractions FFn negative reviewed with patient, reassured less likely to deliver in next two weeks, but PTL reviewed She states pressure is better  Has been battling constipation. Reviewed use of fiber and Miralax due to chronic opiate use Discussed.contraception.  Didn't know she could use LnG IUD. Expelled paragard IUD. Wants to try LnG IUD this time.  Will discharge home with PTL precautions.  Assessment: Single IUP  At 15w3dPT contractions without change in cervix and negative FFn Constipation  Plan: Discharge home Preterm Labor precautions and fetal kick counts Rx Miralax for prn use for constipation Follow up in Office for prenatal visits and recheck of cervix  Encouraged to return here if she develops worsening of symptoms, increase in pain, fever, or other concerning symptoms.    Pt stable at time of discharge.  Hansel Feinstein CNM, MSN Certified Nurse-Midwife 02/01/2020 11:00 PM

## 2020-02-02 DIAGNOSIS — O99891 Other specified diseases and conditions complicating pregnancy: Secondary | ICD-10-CM

## 2020-02-02 DIAGNOSIS — O4702 False labor before 37 completed weeks of gestation, second trimester: Secondary | ICD-10-CM

## 2020-02-02 DIAGNOSIS — Z3A25 25 weeks gestation of pregnancy: Secondary | ICD-10-CM

## 2020-02-02 DIAGNOSIS — M549 Dorsalgia, unspecified: Secondary | ICD-10-CM

## 2020-02-02 LAB — FETAL FIBRONECTIN: Fetal Fibronectin: NEGATIVE

## 2020-02-02 MED ORDER — POLYETHYLENE GLYCOL 3350 17 G PO PACK: 17.0000 g | PACK | Freq: Every day | ORAL | 1 refills | Status: DC

## 2020-02-02 NOTE — Discharge Instructions (Signed)
Levonorgestrel intrauterine device (IUD) What is this medicine? LEVONORGESTREL IUD (LEE voe nor jes trel) is a contraceptive (birth control) device. The device is placed inside the uterus by a healthcare professional. It is used to prevent pregnancy. This device can also be used to treat heavy bleeding that occurs during your period. This medicine may be used for other purposes; ask your health care provider or pharmacist if you have questions. COMMON BRAND NAME(S): Kyleena, LILETTA, Mirena, Skyla What should I tell my health care provider before I take this medicine? They need to know if you have any of these conditions:  abnormal Pap smear  cancer of the breast, uterus, or cervix  diabetes  endometritis  genital or pelvic infection now or in the past  have more than one sexual partner or your partner has more than one partner  heart disease  history of an ectopic or tubal pregnancy  immune system problems  IUD in place  liver disease or tumor  problems with blood clots or take blood-thinners  seizures  use intravenous drugs  uterus of unusual shape  vaginal bleeding that has not been explained  an unusual or allergic reaction to levonorgestrel, other hormones, silicone, or polyethylene, medicines, foods, dyes, or preservatives  pregnant or trying to get pregnant  breast-feeding How should I use this medicine? This device is placed inside the uterus by a health care professional. Talk to your pediatrician regarding the use of this medicine in children. Special care may be needed. Overdosage: If you think you have taken too much of this medicine contact a poison control center or emergency room at once. NOTE: This medicine is only for you. Do not share this medicine with others. What if I miss a dose? This does not apply. Depending on the brand of device you have inserted, the device will need to be replaced every 3 to 6 years if you wish to continue using this type  of birth control. What may interact with this medicine? Do not take this medicine with any of the following medications:  amprenavir  bosentan  fosamprenavir This medicine may also interact with the following medications:  aprepitant  armodafinil  barbiturate medicines for inducing sleep or treating seizures  bexarotene  boceprevir  griseofulvin  medicines to treat seizures like carbamazepine, ethotoin, felbamate, oxcarbazepine, phenytoin, topiramate  modafinil  pioglitazone  rifabutin  rifampin  rifapentine  some medicines to treat HIV infection like atazanavir, efavirenz, indinavir, lopinavir, nelfinavir, tipranavir, ritonavir  St. John's wort  warfarin This list may not describe all possible interactions. Give your health care provider a list of all the medicines, herbs, non-prescription drugs, or dietary supplements you use. Also tell them if you smoke, drink alcohol, or use illegal drugs. Some items may interact with your medicine. What should I watch for while using this medicine? Visit your doctor or health care professional for regular check ups. See your doctor if you or your partner has sexual contact with others, becomes HIV positive, or gets a sexual transmitted disease. This product does not protect you against HIV infection (AIDS) or other sexually transmitted diseases. You can check the placement of the IUD yourself by reaching up to the top of your vagina with clean fingers to feel the threads. Do not pull on the threads. It is a good habit to check placement after each menstrual period. Call your doctor right away if you feel more of the IUD than just the threads or if you cannot feel the threads at   all. The IUD may come out by itself. You may become pregnant if the device comes out. If you notice that the IUD has come out use a backup birth control method like condoms and call your health care provider. Using tampons will not change the position of the  IUD and are okay to use during your period. This IUD can be safely scanned with magnetic resonance imaging (MRI) only under specific conditions. Before you have an MRI, tell your healthcare provider that you have an IUD in place, and which type of IUD you have in place. What side effects may I notice from receiving this medicine? Side effects that you should report to your doctor or health care professional as soon as possible:  allergic reactions like skin rash, itching or hives, swelling of the face, lips, or tongue  fever, flu-like symptoms  genital sores  high blood pressure  no menstrual period for 6 weeks during use  pain, swelling, warmth in the leg  pelvic pain or tenderness  severe or sudden headache  signs of pregnancy  stomach cramping  sudden shortness of breath  trouble with balance, talking, or walking  unusual vaginal bleeding, discharge  yellowing of the eyes or skin Side effects that usually do not require medical attention (report to your doctor or health care professional if they continue or are bothersome):  acne  breast pain  change in sex drive or performance  changes in weight  cramping, dizziness, or faintness while the device is being inserted  headache  irregular menstrual bleeding within first 3 to 6 months of use  nausea This list may not describe all possible side effects. Call your doctor for medical advice about side effects. You may report side effects to FDA at 1-800-FDA-1088. Where should I keep my medicine? This does not apply. NOTE: This sheet is a summary. It may not cover all possible information. If you have questions about this medicine, talk to your doctor, pharmacist, or health care provider.  2020 Elsevier/Gold Standard (2018-10-04 13:22:01) Preterm Labor and Birth Information Pregnancy normally lasts 39-41 weeks. Preterm labor is when labor starts early. It starts before you have been pregnant for 37 whole  weeks. What are the risk factors for preterm labor? Preterm labor is more likely to occur in women who:  Have an infection while pregnant.  Have a cervix that is short.  Have gone into preterm labor before.  Have had surgery on their cervix.  Are younger than age 44.  Are older than age 35.  Are African American.  Are pregnant with two or more babies.  Take street drugs while pregnant.  Smoke while pregnant.  Do not gain enough weight while pregnant.  Got pregnant right after another pregnancy. What are the symptoms of preterm labor? Symptoms of preterm labor include:  Cramps. The cramps may feel like the cramps some women get during their period. The cramps may happen with watery poop (diarrhea).  Pain in the belly (abdomen).  Pain in the lower back.  Regular contractions or tightening. It may feel like your belly is getting tighter.  Pressure in the lower belly that seems to get stronger.  More fluid (discharge) leaking from the vagina. The fluid may be watery or bloody.  Water breaking. Why is it important to notice signs of preterm labor? Babies who are born early may not be fully developed. They have a higher chance for:  Long-term heart problems.  Long-term lung problems.  Trouble controlling body systems,  like breathing.  Bleeding in the brain.  A condition called cerebral palsy.  Learning difficulties.  Death. These risks are highest for babies who are born before 34 weeks of pregnancy. How is preterm labor treated? Treatment depends on:  How long you were pregnant.  Your condition.  The health of your baby. Treatment may involve:  Having a stitch (suture) placed in your cervix. When you give birth, your cervix opens so the baby can come out. The stitch keeps the cervix from opening too soon.  Staying at the hospital.  Taking or getting medicines, such as: ? Hormone medicines. ? Medicines to stop contractions. ? Medicines to help  the babys lungs develop. ? Medicines to prevent your baby from having cerebral palsy. What should I do if I am in preterm labor? If you think you are going into labor too soon, call your doctor right away. How can I prevent preterm labor?  Do not use any tobacco products. ? Examples of these are cigarettes, chewing tobacco, and e-cigarettes. ? If you need help quitting, ask your doctor.  Do not use street drugs.  Do not use any medicines unless you ask your doctor if they are safe for you.  Talk with your doctor before taking any herbal supplements.  Make sure you gain enough weight.  Watch for infection. If you think you might have an infection, get it checked right away.  If you have gone into preterm labor before, tell your doctor. This information is not intended to replace advice given to you by your health care provider. Make sure you discuss any questions you have with your health care provider. Document Revised: 03/17/2019 Document Reviewed: 04/15/2016 Elsevier Patient Education  2020 [employer].

## 2020-02-05 ENCOUNTER — Other Ambulatory Visit: Payer: Self-pay

## 2020-02-05 ENCOUNTER — Encounter: Payer: Self-pay | Admitting: Obstetrics and Gynecology

## 2020-02-05 ENCOUNTER — Ambulatory Visit (INDEPENDENT_AMBULATORY_CARE_PROVIDER_SITE_OTHER): Payer: Medicaid Other | Admitting: Obstetrics and Gynecology

## 2020-02-05 VITALS — BP 99/69 | HR 93 | Wt 267.9 lb

## 2020-02-05 DIAGNOSIS — F329 Major depressive disorder, single episode, unspecified: Secondary | ICD-10-CM

## 2020-02-05 DIAGNOSIS — O99342 Other mental disorders complicating pregnancy, second trimester: Secondary | ICD-10-CM

## 2020-02-05 DIAGNOSIS — Z3A25 25 weeks gestation of pregnancy: Secondary | ICD-10-CM

## 2020-02-05 DIAGNOSIS — O0990 Supervision of high risk pregnancy, unspecified, unspecified trimester: Secondary | ICD-10-CM

## 2020-02-05 DIAGNOSIS — O99340 Other mental disorders complicating pregnancy, unspecified trimester: Secondary | ICD-10-CM

## 2020-02-05 DIAGNOSIS — O24119 Pre-existing diabetes mellitus, type 2, in pregnancy, unspecified trimester: Secondary | ICD-10-CM

## 2020-02-05 DIAGNOSIS — O24112 Pre-existing diabetes mellitus, type 2, in pregnancy, second trimester: Secondary | ICD-10-CM

## 2020-02-05 DIAGNOSIS — O09529 Supervision of elderly multigravida, unspecified trimester: Secondary | ICD-10-CM

## 2020-02-05 DIAGNOSIS — O09522 Supervision of elderly multigravida, second trimester: Secondary | ICD-10-CM

## 2020-02-05 MED ORDER — COMFORT FIT MATERNITY SUPP LG MISC: 0 refills | Status: DC

## 2020-02-05 MED ORDER — GABAPENTIN 300 MG PO CAPS: 300.0000 mg | ORAL_CAPSULE | Freq: Three times a day (TID) | ORAL | 3 refills | Status: DC

## 2020-02-05 NOTE — Progress Notes (Signed)
   PRENATAL VISIT NOTE  Subjective:  Shelly Lane is a 44 y.o. [id number] at [redacted]w[redacted]d being seen today for ongoing prenatal care.  She is currently monitored for the following issues for this high-risk pregnancy and has OBESITY; ANXIETY; BULIMIA; Depression affecting pregnancy, antepartum; Advanced maternal age in multigravida; SAB (spontaneous abortion) x 4; Grand multipara; Hx of preterm delivery x 2, currently pregnant; Hx gestational diabetes x 3; Multiple drug allergies; History of abnormal cervical Pap smear; DM (diabetes mellitus), type 2 (HCC); Sciatic nerve pain, right; Pregnancy; History of postpartum depression; Chronic pain; Supervision of high risk pregnancy, antepartum; Alpha thalassemia silent carrier; and Diabetes mellitus during pregnancy, antepartum on their problem list.  Patient reports left sciatic nerve pain.  Contractions: Not present. Vag. Bleeding: None.  Movement: Present. Denies leaking of fluid.   The following portions of the patient's history were reviewed and updated as appropriate: allergies, current medications, past family history, past medical history, past social history, past surgical history and problem list.   Objective:   Vitals:   02/05/20 1540  BP: 99/69  Pulse: 93  Weight: 267 lb 14.4 oz (121.5 kg)    Fetal Status: Fetal Heart Rate (bpm): 145 Fundal Height: 28 cm Movement: Present     General:  Alert, oriented and cooperative. Patient is in no acute distress.  Skin: Skin is warm and dry. No rash noted.   Cardiovascular: Normal heart rate noted  Respiratory: Normal respiratory effort, no problems with respiration noted  Abdomen: Soft, gravid, appropriate for gestational age.  Pain/Pressure: Present     Pelvic: Cervical exam deferred        Extremities: Normal range of motion.  Edema: None  Mental Status: Normal mood and affect. Normal behavior. Normal judgment and thought content.   Assessment and Plan:  Pregnancy: [id number] at [redacted]w[redacted]d 1.  Supervision of high risk pregnancy, antepartum Patient is doing well Third trimester labs today  2. Pre-existing type 2 diabetes mellitus during pregnancy, antepartum Patient did not bring CBG log or meter. She reports fasting 79-83 and pp 89-113 Will continue with pm insulin at current dosage  3. Antepartum multigravida of advanced maternal age   44. Depression affecting pregnancy, antepartum Stable  Preterm labor symptoms and general obstetric precautions including but not limited to vaginal bleeding, contractions, leaking of fluid and fetal movement were reviewed in detail with the patient. Please refer to After Visit Summary for other counseling recommendations.   No follow-ups on file.  No future appointments.  Catalina Antigua, MD

## 2020-02-05 NOTE — Progress Notes (Signed)
Patient reports fetal movement with pressure. 

## 2020-02-06 ENCOUNTER — Encounter: Payer: Medicaid Other | Admitting: Obstetrics & Gynecology

## 2020-02-06 LAB — CBC
Hematocrit: 38.1 % (ref 34.0–46.6)
Hemoglobin: 13 g/dL (ref 11.1–15.9)
MCH: 29.2 pg (ref 26.6–33.0)
MCHC: 34.1 g/dL (ref 31.5–35.7)
MCV: 86 fL (ref 79–97)
Platelets: 294 10*3/uL (ref 150–450)
RBC: 4.45 x10E6/uL (ref 3.77–5.28)
RDW: 13.3 % (ref 11.7–15.4)
WBC: 9.4 10*3/uL (ref 3.4–10.8)

## 2020-02-06 LAB — HIV ANTIBODY (ROUTINE TESTING W REFLEX): HIV Screen 4th Generation wRfx: NONREACTIVE

## 2020-02-06 LAB — RPR: RPR Ser Ql: NONREACTIVE

## 2020-02-19 ENCOUNTER — Ambulatory Visit (HOSPITAL_COMMUNITY)
Admission: RE | Admit: 2020-02-19 | Discharge: 2020-02-19 | Disposition: A | Discharge status: Home / Self Care | Payer: Medicaid Other | Source: Ambulatory Visit | Attending: Obstetrics and Gynecology | Admitting: Obstetrics and Gynecology

## 2020-02-19 ENCOUNTER — Encounter (HOSPITAL_COMMUNITY): Payer: Self-pay | Admitting: *Deleted

## 2020-02-19 ENCOUNTER — Other Ambulatory Visit: Payer: Self-pay

## 2020-02-19 ENCOUNTER — Ambulatory Visit (HOSPITAL_COMMUNITY): Payer: Medicaid Other | Admitting: *Deleted

## 2020-02-19 ENCOUNTER — Telehealth (INDEPENDENT_AMBULATORY_CARE_PROVIDER_SITE_OTHER): Payer: Medicaid Other | Admitting: Obstetrics and Gynecology

## 2020-02-19 ENCOUNTER — Encounter: Payer: Self-pay | Admitting: Obstetrics and Gynecology

## 2020-02-19 VITALS — BP 103/68 | HR 90

## 2020-02-19 DIAGNOSIS — Z3A27 27 weeks gestation of pregnancy: Secondary | ICD-10-CM

## 2020-02-19 DIAGNOSIS — F419 Anxiety disorder, unspecified: Secondary | ICD-10-CM

## 2020-02-19 DIAGNOSIS — O0940 Supervision of pregnancy with grand multiparity, unspecified trimester: Secondary | ICD-10-CM

## 2020-02-19 DIAGNOSIS — O09522 Supervision of elderly multigravida, second trimester: Secondary | ICD-10-CM

## 2020-02-19 DIAGNOSIS — O24119 Pre-existing diabetes mellitus, type 2, in pregnancy, unspecified trimester: Secondary | ICD-10-CM | POA: Diagnosis not present

## 2020-02-19 DIAGNOSIS — Z148 Genetic carrier of other disease: Secondary | ICD-10-CM

## 2020-02-19 DIAGNOSIS — O24112 Pre-existing diabetes mellitus, type 2, in pregnancy, second trimester: Secondary | ICD-10-CM

## 2020-02-19 DIAGNOSIS — O24414 Gestational diabetes mellitus in pregnancy, insulin controlled: Secondary | ICD-10-CM

## 2020-02-19 DIAGNOSIS — O99212 Obesity complicating pregnancy, second trimester: Secondary | ICD-10-CM

## 2020-02-19 DIAGNOSIS — O09523 Supervision of elderly multigravida, third trimester: Secondary | ICD-10-CM

## 2020-02-19 DIAGNOSIS — O0990 Supervision of high risk pregnancy, unspecified, unspecified trimester: Secondary | ICD-10-CM

## 2020-02-19 DIAGNOSIS — Z362 Encounter for other antenatal screening follow-up: Secondary | ICD-10-CM | POA: Diagnosis not present

## 2020-02-19 DIAGNOSIS — O99342 Other mental disorders complicating pregnancy, second trimester: Secondary | ICD-10-CM

## 2020-02-19 DIAGNOSIS — F502 Bulimia nervosa: Secondary | ICD-10-CM

## 2020-02-19 DIAGNOSIS — O09219 Supervision of pregnancy with history of pre-term labor, unspecified trimester: Secondary | ICD-10-CM

## 2020-02-19 NOTE — Progress Notes (Signed)
TELEHEALTH OBSTETRICS PRENATAL VIRTUAL VIDEO VISIT ENCOUNTER NOTE  [facility]   I connected with Shelly GamblesLinda Lane on 02/19/20 at  4:15 PM EDT by MyChart Video Encounter at home and verified that I am speaking with the correct person using two identifiers.   I discussed the limitations, risks, security and privacy concerns of performing an evaluation and management service virtually and the availability of in person appointments. I also discussed with the patient that there may be a patient responsible charge related to this service. The patient expressed understanding and agreed to proceed. Subjective:  Shelly Lane is a 44 y.o. [id number] at 6346w6d being seen today for ongoing prenatal care.  She is currently monitored for the following issues for this high-risk pregnancy and has OBESITY; ANXIETY; BULIMIA; Depression affecting pregnancy, antepartum; Advanced maternal age in multigravida; SAB (spontaneous abortion) x 4; Grand multipara; Hx of preterm delivery x 2, currently pregnant; Hx gestational diabetes x 3; Multiple drug allergies; History of abnormal cervical Pap smear; DM (diabetes mellitus), type 2 (HCC); Sciatic nerve pain, right; Pregnancy; History of postpartum depression; Chronic pain; Supervision of high risk pregnancy, antepartum; Alpha thalassemia silent carrier; and Diabetes mellitus during pregnancy, antepartum on their problem list.  Patient reports no complaints.  Contractions: Not present. Vag. Bleeding: None.  Movement: Present. Denies any leaking of fluid.   The following portions of the patient's history were reviewed and updated as appropriate: allergies, current medications, past family history, past medical history, past social history, past surgical history and problem list.   Objective:   Vitals:   02/19/20 1616  BP: 103/68  Pulse: 90    Fetal Status:     Movement: Present     General:  Alert, oriented and  cooperative. Patient is in no acute distress.  Respiratory: Normal respiratory effort, no problems with respiration noted  Mental Status: Normal mood and affect. Normal behavior. Normal judgment and thought content.  Rest of physical exam deferred due to type of encounter  Imaging: US MFM OB FOLLOW UP  Result Date: 01/24/2020 ----------------------------------------------------------------------  OBSTETRICS REPORT                       (Signed Final 01/24/2020 04:43 pm) ---------------------------------------------------------------------- Patient Info  ID #:       [id number]                          D.O.B.:  06-Nov-1976 (43 yrs)  Name:       Shelly Lane          Visit Date: 01/24/2020 03:54 pm ---------------------------------------------------------------------- Performed By  Performed By:     Percell BostonHeather Waken          Ref. Address:     [address]                    [address]                                                             [address]  [address]                                                             [place]                                                             [address]  Attending:        Noralee Space MD        [facility]  Referred By:      [facility] ---------------------------------------------------------------------- Orders   #  Description                          Code         Ordered By   1  [place] MFM OB FOLLOW UP                  [phone]     Jacy Brocker  ----------------------------------------------------------------------   #  Order #                    Accession #                 Episode #   1  [id number]                  [id number]                  [id number]  ---------------------------------------------------------------------- Indications   [redacted] weeks gestation of pregnancy                Z3A.24   Gestational diabetes in  pregnancy, diet        O24.410   controlled   Advanced maternal age multigravida 24+,        O31.523   third trimester (low risk NIPS)   Grand multiparity, antepartum                  O09.40   Preterm labor without delivery, second         O78.02   trimester (x2 @ 36 weeks)   Genetic carrier (alpha thal)                   Z14.8   Obesity complicating pregnancy, second         O99.212   trimester   Encounter for other antenatal screening        Z36.2   follow-up  ---------------------------------------------------------------------- Fetal Evaluation  Num Of Fetuses:         1  Fetal Heart Rate(bpm):  132  Cardiac Activity:       Observed  Presentation:           Cephalic  Placenta:               Anterior  P. Cord Insertion:      Visualized, central  Amniotic Fluid  AFI FV:      Within normal limits                              Largest Pocket(cm)                              8.65 ---------------------------------------------------------------------- Biometry  BPD:      57.3  mm     G. Age:  23w 4d         22  %    CI:        69.18   %    70 - 86                                                          FL/HC:      19.7   %    18.7 - 20.9  HC:       220   mm     G. Age:  24w 0d         27  %    HC/AC:      1.10        1.05 - 1.21  AC:      199.5  mm     G. Age:  24w 4d         56  %    FL/BPD:     75.7   %    71 - 87  FL:       43.4  mm     G. Age:  24w 2d         40  %    FL/AC:      21.8   %    20 - 24  HUM:      39.9  mm     G. Age:  24w 2d         46  %  CER:      27.4  mm     G. Age:  24w 5d         63  %  CM:        6.1  mm  Est. FW:     687  gm      1 lb 8 oz     51  % ---------------------------------------------------------------------- OB History  Gravidity:    13        Term:   8        Prem:   2        SAB:   2  TOP:          0       Ectopic:  0        Living: 10 ---------------------------------------------------------------------- Gestational Age  U/S Today:     24w 1d  EDD:   05/14/20  Best:          24w 1d     Det. ByLoman Chroman         EDD:   05/14/20                                      (11/01/19) ---------------------------------------------------------------------- Anatomy  Cranium:               Appears normal         Aortic Arch:            Previously seen  Cavum:                 Appears normal         Ductal Arch:            Previously seen  Ventricles:            Appears normal         Diaphragm:              Previously seen  Choroid Plexus:        Previously seen        Stomach:                Appears normal, left                                                                        sided  Cerebellum:            Appears normal         Abdomen:                Previously seen  Posterior Fossa:       Appears normal         Abdominal Wall:         Previously seen  Nuchal Fold:           Not applicable (>07    Cord Vessels:           Previously seen                         wks GA)  Face:                  Orbits and profile     Kidneys:                Previously seen                         previously seen  Lips:                  Previously seen        Bladder:                Appears normal  Thoracic:              Appears normal         Spine:  Previously seen  Heart:                 Echogenic focus        Upper Extremities:      Previously seen                         in LV  RVOT:                  Previously seen        Lower Extremities:      Previously seen  LVOT:                  Previously seen  Other:  Fetus appears to be a female. Heels and 5th digit visualized previously.          Open hands visualized previously. Nasal bone visualized previously. ---------------------------------------------------------------------- Cervix Uterus Adnexa  Cervix  Length:              5  cm.  Normal appearance by transabdominal scan.  Uterus  No abnormality visualized.  Left Ovary  No adnexal mass visualized.  Right Ovary  No adnexal mass visualized.  Cul De Sac   No free fluid seen.  Adnexa  No abnormality visualized. ---------------------------------------------------------------------- Impression  Patient with diagnoses of diabetes and early gestation return  for fetal growth assessment.  Hemoglobin A1c performed in  November 2020 was within normal range.  Amniotic fluid is normal and good fetal activity is seen. Fetal  growth is appropriate for gestational age. ---------------------------------------------------------------------- Recommendations  -An appointment was made for her to return in 4 weeks for  fetal growth assessment. ----------------------------------------------------------------------                  Noralee Space, MD Electronically Signed Final Report   01/24/2020 04:43 pm ----------------------------------------------------------------------   Assessment and Plan:  Pregnancy: [id number] at [redacted]w[redacted]d 1. Supervision of high risk pregnancy, antepartum Patient is doing well without complaints  2. Pre-existing type 2 diabetes mellitus during pregnancy, antepartum Patient reports all values within range including fasting. She is currently at ultrasound office and does not have log with her Patient had a growth ultrasound today Continue insulin Continue ASA  3. Multigravida of advanced maternal age in third trimester   Preterm labor symptoms and general obstetric precautions including but not limited to vaginal bleeding, contractions, leaking of fluid and fetal movement were reviewed in detail with the patient. I discussed the assessment and treatment plan with the patient. The patient was provided an opportunity to ask questions and all were answered. The patient agreed with the plan and demonstrated an understanding of the instructions. The patient was advised to call back or seek an in-person office evaluation/go to MAU at [facility] for any urgent or concerning symptoms. Please refer to After Visit Summary for other counseling  recommendations.   I provided 12 minutes of face-to-face time during this encounter.  Return in about 2 weeks (around 03/04/2020) for Virtual, ROB, High risk.  No future appointments.  Catalina Antigua, MD [facility]

## 2020-03-07 ENCOUNTER — Encounter: Payer: Self-pay | Admitting: Obstetrics and Gynecology

## 2020-03-07 ENCOUNTER — Telehealth (INDEPENDENT_AMBULATORY_CARE_PROVIDER_SITE_OTHER): Payer: Medicaid Other | Admitting: Obstetrics and Gynecology

## 2020-03-07 DIAGNOSIS — O24119 Pre-existing diabetes mellitus, type 2, in pregnancy, unspecified trimester: Secondary | ICD-10-CM

## 2020-03-07 DIAGNOSIS — O0990 Supervision of high risk pregnancy, unspecified, unspecified trimester: Secondary | ICD-10-CM

## 2020-03-07 DIAGNOSIS — O0993 Supervision of high risk pregnancy, unspecified, third trimester: Secondary | ICD-10-CM

## 2020-03-07 DIAGNOSIS — O99343 Other mental disorders complicating pregnancy, third trimester: Secondary | ICD-10-CM

## 2020-03-07 DIAGNOSIS — F329 Major depressive disorder, single episode, unspecified: Secondary | ICD-10-CM

## 2020-03-07 DIAGNOSIS — O24113 Pre-existing diabetes mellitus, type 2, in pregnancy, third trimester: Secondary | ICD-10-CM

## 2020-03-07 DIAGNOSIS — Z3A30 30 weeks gestation of pregnancy: Secondary | ICD-10-CM

## 2020-03-07 DIAGNOSIS — F32A Depression, unspecified: Secondary | ICD-10-CM

## 2020-03-07 DIAGNOSIS — O09523 Supervision of elderly multigravida, third trimester: Secondary | ICD-10-CM

## 2020-03-07 NOTE — Progress Notes (Signed)
Virtual Visit via Telephone Note  I connected with Shelly Lane on 03/07/20 at  4:15 PM EDT by telephone and verified that I am speaking with the correct person using two identifiers.  Pt did not pick up BP cuff Pt c/o swelling in her feet Pt has CBG readings available

## 2020-03-07 NOTE — Progress Notes (Signed)
OBSTETRICS PRENATAL VIRTUAL VISIT ENCOUNTER NOTE  Provider location: [facility] [place]   I connected with Shelly Lane on 03/07/20 at  4:15 PM EDT by MyChart Video Encounter at home and verified that I am speaking with the correct person using two identifiers.   I discussed the limitations, risks, security and privacy concerns of performing an evaluation and management service virtually and the availability of in person appointments. I also discussed with the patient that there may be a patient responsible charge related to this service. The patient expressed understanding and agreed to proceed. Subjective:  Shelly Lane is a 44 y.o. [id number] at [redacted]w[redacted]d being seen today for ongoing prenatal care.  She is currently monitored for the following issues for this high-risk pregnancy and has OBESITY; ANXIETY; BULIMIA; Depression affecting pregnancy, antepartum; Advanced maternal age in multigravida; SAB (spontaneous abortion) x 4; [place] multipara; Hx of preterm delivery x 2, currently pregnant; Hx gestational diabetes x 3; Multiple drug allergies; History of abnormal cervical Pap smear; DM (diabetes mellitus), type 2 ([place]); Sciatic nerve pain, right; Pregnancy; History of postpartum depression; Chronic pain; Supervision of high risk pregnancy, antepartum; Alpha thalassemia silent carrier; and Diabetes mellitus during pregnancy, antepartum on their problem list.  Patient reports lower extremity swelling.  Contractions: Irregular. Vag. Bleeding: None.  Movement: Present. Denies any leaking of fluid.   The following portions of the patient's history were reviewed and updated as appropriate: allergies, current medications, past family history, past medical history, past social history, past surgical history and problem list.   Objective:  There were no vitals filed for this visit.  Fetal Status:     Movement: Present     General:  Alert, oriented and cooperative. Patient is  in no acute distress.  Respiratory: Normal respiratory effort, no problems with respiration noted  Mental Status: Normal mood and affect. Normal behavior. Normal judgment and thought content.  Rest of physical exam deferred due to type of encounter  Imaging: [place] MFM OB FOLLOW UP  Result Date: 02/19/2020 ----------------------------------------------------------------------  OBSTETRICS REPORT                       (Signed Final 02/19/2020 04:41 pm) ---------------------------------------------------------------------- Patient Info  ID #:       [id number]                          D.O.B.:  [birth year] (43 yrs)  Name:       Shelly Lane          Visit Date: 02/19/2020 03:26 pm ---------------------------------------------------------------------- Performed By  Performed By:     Rodrigo Ran BS      Ref. Address:     [address]                                                             [place]                                                             [address]  Attending:        Noralee Space MD        [facility]  Referred By:      [facility] ---------------------------------------------------------------------- Orders   #  Description                          Code         Ordered By   1  [place] MFM OB FOLLOW UP                  [phone]     Jazzmin Newbold  ----------------------------------------------------------------------   #  Order #                    Accession #                 Episode #   1  [id number]                  [id number]                  [id number]  ---------------------------------------------------------------------- Indications   Encounter for other antenatal screening        Z36.2   follow-up   Gestational diabetes in  pregnancy, insulin     O24.414   controlled   Advanced maternal age multigravida 61+,        O45.522   second trimester   Grand multiparity, antepartum                  O09.40   Poor obstetric history: Previous preterm       O09.219   delivery, antepartum (36 wks)   Genetic carrier (alpha thal)                   Z14.8   Obesity complicating pregnancy, second         O99.212   trimester   [redacted] weeks gestation of pregnancy                Z3A.27  ---------------------------------------------------------------------- Fetal Evaluation  Num Of Fetuses:         1  Fetal Heart Rate(bpm):  146  Cardiac Activity:       Observed  Presentation:  Cephalic  Placenta:               Anterior  P. Cord Insertion:      Visualized  Amniotic Fluid  AFI FV:      Within normal limits                              Largest Pocket(cm)                              7.3 ---------------------------------------------------------------------- Biometry  BPD:      67.6  mm     G. Age:  27w 2d         19  %    CI:        67.44   %    70 - 86                                                          FL/HC:      20.3   %    18.8 - 20.6  HC:      263.5  mm     G. Age:  28w 5d         45  %    HC/AC:      1.09        1.05 - 1.21  AC:       241   mm     G. Age:  28w 3d         59  %    FL/BPD:     79.3   %    71 - 87  FL:       53.6  mm     G. Age:  28w 3d         52  %    FL/AC:      22.2   %    20 - 24  HUM:      48.9  mm     G. Age:  28w 6d         63  %  Est. FW:    1212  gm    2 lb 11 oz      56  % ---------------------------------------------------------------------- OB History  Gravidity:    13        Term:   8        Prem:   2        SAB:   2  TOP:          0       Ectopic:  0        Living: 10 ---------------------------------------------------------------------- Gestational Age  U/S Today:     28w 2d                                        EDD:   05/11/20  Best:          27w 6d     Det. ByMarcella Dubs:  Early Ultrasound         EDD:   05/14/20                                       (  11/01/19) ---------------------------------------------------------------------- Anatomy  Cranium:               Appears normal         Aortic Arch:            Previously seen  Cavum:                 Appears normal         Ductal Arch:            Previously seen  Ventricles:            Appears normal         Diaphragm:              Previously seen  Choroid Plexus:        Previously seen        Stomach:                Appears normal, left                                                                        sided  Cerebellum:            Appears normal         Abdomen:                Previously seen  Posterior Fossa:       Appears normal         Abdominal Wall:         Previously seen  Nuchal Fold:           Not applicable (>20    Cord Vessels:           Previously seen                         wks GA)  Face:                  Appears normal         Kidneys:                Appear normal                         (orbits and profile)  Lips:                  Appears normal         Bladder:                Appears normal  Thoracic:              Appears normal         Spine:                  Previously seen  Heart:                 Echogenic focus        Upper Extremities:      Previously seen                         in LV  RVOT:  Previously seen        Lower Extremities:      Previously seen  LVOT:                  Appears normal  Other:  Heels and 5th digit visualized previously. Open hands visualized          previously. Nasal bone visualized previously. ---------------------------------------------------------------------- Cervix Uterus Adnexa  Cervix  Not visualized (advanced GA >24wks)  Uterus  No abnormality visualized.  Left Ovary  Within normal limits.  Right Ovary  Not visualized.  Cul De Sac  No free fluid seen.  Adnexa  No abnormality visualized. ---------------------------------------------------------------------- Impression  Gestational diabetes. Patient takes insulin  for control.  Amniotic fluid is normal and good fetal activity is seen. Fetal  growth is appropriate for gestational age. We reassured the  patient of the findings. ---------------------------------------------------------------------- Recommendations  -An appointment was made for her to return in 4 weeks for  fetal growth assessment.  -Weekly BPP from next visit till delivery. ----------------------------------------------------------------------                  Noralee Space, MD Electronically Signed Final Report   02/19/2020 04:41 pm ----------------------------------------------------------------------   Assessment and Plan:  Pregnancy: [id number] at [redacted]w[redacted]d 1. Supervision of high risk pregnancy, antepartum Patient is doing well reporting some lower extremity swelling which started 3 days ago. Both are equal in size Patient did not pick up BP cuff and is not able to check BP today. She denies any other symptoms Will contact pharmacy to assist with obtaining BP cuff Patient encouraged to go to a local pharmacy for BP check and to contact the office if greater than 140/90  2. Pre-existing type 2 diabetes mellitus during pregnancy, antepartum Patient reports continued use of evening insulin and normal CBG values. Unable to report CBG Continue weekly BPP  3. Multigravida of advanced maternal age in third trimester   4. Depression affecting pregnancy, antepartum Stable without medication  Preterm labor symptoms and general obstetric precautions including but not limited to vaginal bleeding, contractions, leaking of fluid and fetal movement were reviewed in detail with the patient. I discussed the assessment and treatment plan with the patient. The patient was provided an opportunity to ask questions and all were answered. The patient agreed with the plan and demonstrated an understanding of the instructions. The patient was advised to call back or seek an in-person office evaluation/go to MAU at  [facility] for any urgent or concerning symptoms. Please refer to After Visit Summary for other counseling recommendations.   I provided 11 minutes of face-to-face time during this encounter.  No follow-ups on file.  No future appointments.  Catalina Antigua, MD [facility] [employer], [facility]

## 2020-03-11 ENCOUNTER — Other Ambulatory Visit (HOSPITAL_COMMUNITY): Payer: Self-pay | Admitting: *Deleted

## 2020-03-11 DIAGNOSIS — O09529 Supervision of elderly multigravida, unspecified trimester: Secondary | ICD-10-CM

## 2020-03-11 DIAGNOSIS — O09523 Supervision of elderly multigravida, third trimester: Secondary | ICD-10-CM

## 2020-03-18 ENCOUNTER — Other Ambulatory Visit: Payer: Self-pay

## 2020-03-18 ENCOUNTER — Encounter (HOSPITAL_COMMUNITY): Payer: Self-pay | Admitting: *Deleted

## 2020-03-18 ENCOUNTER — Ambulatory Visit (HOSPITAL_COMMUNITY)
Admission: RE | Admit: 2020-03-18 | Discharge: 2020-03-18 | Disposition: A | Discharge status: Home / Self Care | Payer: Medicaid Other | Source: Ambulatory Visit | Attending: Obstetrics and Gynecology | Admitting: Obstetrics and Gynecology

## 2020-03-18 ENCOUNTER — Ambulatory Visit (HOSPITAL_COMMUNITY): Payer: Medicaid Other | Admitting: *Deleted

## 2020-03-18 DIAGNOSIS — Z148 Genetic carrier of other disease: Secondary | ICD-10-CM

## 2020-03-18 DIAGNOSIS — O09529 Supervision of elderly multigravida, unspecified trimester: Secondary | ICD-10-CM | POA: Diagnosis present

## 2020-03-18 DIAGNOSIS — O24414 Gestational diabetes mellitus in pregnancy, insulin controlled: Secondary | ICD-10-CM

## 2020-03-18 DIAGNOSIS — Z362 Encounter for other antenatal screening follow-up: Secondary | ICD-10-CM | POA: Diagnosis not present

## 2020-03-18 DIAGNOSIS — O0990 Supervision of high risk pregnancy, unspecified, unspecified trimester: Secondary | ICD-10-CM | POA: Diagnosis present

## 2020-03-18 DIAGNOSIS — Z3A31 31 weeks gestation of pregnancy: Secondary | ICD-10-CM

## 2020-03-18 DIAGNOSIS — O09219 Supervision of pregnancy with history of pre-term labor, unspecified trimester: Secondary | ICD-10-CM

## 2020-03-18 DIAGNOSIS — O0940 Supervision of pregnancy with grand multiparity, unspecified trimester: Secondary | ICD-10-CM

## 2020-03-18 DIAGNOSIS — O09522 Supervision of elderly multigravida, second trimester: Secondary | ICD-10-CM | POA: Diagnosis not present

## 2020-03-19 ENCOUNTER — Other Ambulatory Visit (HOSPITAL_COMMUNITY): Payer: Self-pay | Admitting: *Deleted

## 2020-03-19 DIAGNOSIS — O24113 Pre-existing diabetes mellitus, type 2, in pregnancy, third trimester: Secondary | ICD-10-CM

## 2020-03-20 ENCOUNTER — Encounter: Payer: Self-pay | Admitting: Obstetrics & Gynecology

## 2020-03-20 ENCOUNTER — Telehealth (INDEPENDENT_AMBULATORY_CARE_PROVIDER_SITE_OTHER): Payer: Medicaid Other | Admitting: Obstetrics & Gynecology

## 2020-03-20 DIAGNOSIS — O99213 Obesity complicating pregnancy, third trimester: Secondary | ICD-10-CM | POA: Diagnosis not present

## 2020-03-20 DIAGNOSIS — O24113 Pre-existing diabetes mellitus, type 2, in pregnancy, third trimester: Secondary | ICD-10-CM | POA: Diagnosis not present

## 2020-03-20 DIAGNOSIS — O0990 Supervision of high risk pregnancy, unspecified, unspecified trimester: Secondary | ICD-10-CM

## 2020-03-20 DIAGNOSIS — O09523 Supervision of elderly multigravida, third trimester: Secondary | ICD-10-CM

## 2020-03-20 DIAGNOSIS — O24119 Pre-existing diabetes mellitus, type 2, in pregnancy, unspecified trimester: Secondary | ICD-10-CM

## 2020-03-20 DIAGNOSIS — Z3A32 32 weeks gestation of pregnancy: Secondary | ICD-10-CM

## 2020-03-20 DIAGNOSIS — O0993 Supervision of high risk pregnancy, unspecified, third trimester: Secondary | ICD-10-CM | POA: Diagnosis not present

## 2020-03-20 MED ORDER — INSULIN NPH (HUMAN) (ISOPHANE) 100 UNIT/ML ~~LOC~~ SUSP: 20.0000 [IU] | Freq: Two times a day (BID) | SUBCUTANEOUS | 3 refills | Status: DC

## 2020-03-20 NOTE — Progress Notes (Signed)
Virtual ROB   CC : Needs to discuss elevated blood sugars.and medication management  Pt states she has not been able to do low carb foods due to religious observance and cooking for her family.

## 2020-03-20 NOTE — Progress Notes (Signed)
TELEHEALTH VIRTUAL OBSTETRICS VISIT ENCOUNTER NOTE  I connected with Shelly Lane on 03/20/20 at  3:45 PM EDT by telephone at home and verified that I am speaking with the correct person using two identifiers.   I discussed the limitations, risks, security and privacy concerns of performing an evaluation and management service by telephone and the availability of in person appointments. I also discussed with the patient that there may be a patient responsible charge related to this service. The patient expressed understanding and agreed to proceed.  Subjective:  Shelly Lane is a 44 y.o. [id number] at [redacted]w[redacted]d being followed for ongoing prenatal care.  She is currently monitored for the following issues for this high-risk pregnancy and has OBESITY; ANXIETY; BULIMIA; Depression affecting pregnancy, antepartum; Advanced maternal age in multigravida; SAB (spontaneous abortion) x 4; Grand multipara; Hx of preterm delivery x 2, currently pregnant; Hx gestational diabetes x 3; Multiple drug allergies; History of abnormal cervical Pap smear; DM (diabetes mellitus), type 2 (HCC); Sciatic nerve pain, right; Pregnancy; History of postpartum depression; Chronic pain; Supervision of high risk pregnancy, antepartum; Alpha thalassemia silent carrier; and Diabetes mellitus during pregnancy, antepartum on their problem list.  Patient reports elevated BG, eating more carbs during Ramadan. Reports fetal movement. Denies any contractions, bleeding or leaking of fluid.   The following portions of the patient's history were reviewed and updated as appropriate: allergies, current medications, past family history, past medical history, past social history, past surgical history and problem list.   Objective:   General:  Alert, oriented and cooperative.   Mental Status: Normal mood and affect perceived. Normal judgment and thought content.  Rest of physical exam deferred due to type of encounter  Assessment and  Plan:  Pregnancy: [id number] at [redacted]w[redacted]d 1. Supervision of high risk pregnancy, antepartum   2. Pre-existing type 2 diabetes mellitus during pregnancy, antepartum FBS 90's, PP 150-160s, add second insulin dose - insulin NPH Human (NOVOLIN N) 100 UNIT/ML injection; Inject 0.2 mLs (20 Units total) into the skin 2 (two) times daily before a meal. At breakfast and supper  Dispense: 10 mL; Refill: 3  3. Class 2 severe obesity due to excess calories with serious comorbidity in adult, unspecified BMI (HCC)   4. Multigravida of advanced maternal age in third trimester    Preterm labor symptoms and general obstetric precautions including but not limited to vaginal bleeding, contractions, leaking of fluid and fetal movement were reviewed in detail with the patient.  I discussed the assessment and treatment plan with the patient. The patient was provided an opportunity to ask questions and all were answered. The patient agreed with the plan and demonstrated an understanding of the instructions. The patient was advised to call back or seek an in-person office evaluation/go to MAU at [facility] for any urgent or concerning symptoms. Please refer to After Visit Summary for other counseling recommendations.   I provided 15 minutes of non-face-to-face time during this encounter.  Return in about 1 week (around 03/27/2020) for virtual.  Future Appointments  Date Time Provider Department Center  03/25/2020  3:15 PM WH-MFC NURSE WH-MFC MFC-US  03/25/2020  3:15 PM WH-MFC [place] 4 WH-MFCUS MFC-US  04/01/2020  3:45 PM WH-MFC NURSE WH-MFC MFC-US  04/01/2020  3:45 PM WH-MFC [place] 3 WH-MFCUS MFC-US  04/09/2020  3:45 PM WH-MFC NURSE WH-MFC MFC-US  04/09/2020  3:45 PM WH-MFC [place] 3 WH-MFCUS MFC-US  04/15/2020  3:15 PM WH-MFC NURSE WH-MFC MFC-US  04/15/2020  3:45 PM WH-MFC [place] 3  WH-MFCUS MFC-US  04/22/2020  3:45 PM [place] NURSE [place] MFC-US  04/22/2020  3:45 PM [place] 4 WH-MFCUS MFC-US  04/29/2020  3:45 PM [place]  NURSE [place] MFC-US  04/29/2020  3:45 PM [place]    Emeterio Reeve, [facility] [employer], [place]

## 2020-03-20 NOTE — Patient Instructions (Signed)
Gestational Diabetes Mellitus, Self Care When you have gestational diabetes (gestational diabetes mellitus), you must make sure your blood sugar (glucose) stays in a healthy range. You can do this with:  Nutrition.  Exercise.  Lifestyle changes.  Medicines or insulin, if needed.  Support from your doctors and others. If you get treated for this condition, it may not hurt you or your unborn baby (fetus). If you do not get treated for this condition, it may cause problems that can hurt you or your unborn baby. If you get gestational diabetes, you are:  More likely to get it if you get pregnant again.  More likely to develop type 2 diabetes in the future. How to stay aware of blood sugar   Check your blood sugar every day while you are pregnant. Check it as often as told.  Call your doctor if your blood sugar is above your goal numbers for two tests in a row. Your doctor will set personal treatment goals for you. Generally, you should have these blood sugar levels:  Before meals, or after not eating for a long time (fasting or preprandial): at or below 95 mg/dL (5.3 mmol/L).  After meals (postprandial): ? One hour after a meal: at or below 140 mg/dL (7.8 mmol/L). ? Two hours after a meal: at or below 120 mg/dL (6.7 mmol/L).  A1c (hemoglobin A1c) level: 6-6.5%. How to manage high and low blood sugar Signs of high blood sugar High blood sugar is called hyperglycemia. Know the early signs of high blood sugar. Signs may include:  Feeling: ? Thirsty. ? Hungry. ? Very tired.  Needing to pee (urinate) more than usual.  Blurry vision. Signs of low blood sugar Low blood sugar is called hypoglycemia. This is when blood sugar is at or below 70 mg/dL (3.9 mmol/L). Signs may include:  Feeling: ? Hungry. ? Worried or nervous (anxious). ? Sweaty and clammy. ? Confused. ? Dizzy. ? Sleepy. ? Sick to your stomach (nauseous).  Having: ? A fast heartbeat. ? A headache. ? A change  in your vision. ? Tingling or no feeling (numbness) around your mouth, lips, or tongue. ? Jerky movements that you cannot control (seizure).  Having trouble with: ? Moving (coordination). ? Sleeping. ? Passing out (fainting). ? Getting upset easily (irritability). Treating low blood sugar To treat low blood sugar, eat or drink something sugary right away. If you can think clearly and swallow safely, follow the 15:15 rule:  Take 15 grams of a fast-acting carb (carbohydrate). Talk with your doctor about how much you should take.  Some fast-acting carbs are: ? Sugar tablets (glucose pills). Take 3-4 glucose pills. ? 6-8 pieces of hard candy. ? 4-6 oz (120-150 mL) of fruit juice. ? 4-6 oz (120-150 mL) of regular (not diet) soda. ? 1 Tbsp (15 mL) honey or sugar.  Check your blood sugar 15 minutes after you take the carb.  If your blood sugar is still at or below 70 mg/dL (3.9 mmol/L), take 15 grams of a carb again.  If your blood sugar does not go above 70 mg/dL (3.9 mmol/L) after 3 tries, get help right away.  After your blood sugar goes back to normal, eat a meal or a snack within 1 hour. Treating very low blood sugar If your blood sugar is at or below 54 mg/dL (3 mmol/L), you have very low blood sugar (severe hypoglycemia). This is an emergency. Do not wait to see if the symptoms will go away. Get medical help right  away. Call your local emergency services (911 in the [place] If you have very low blood sugar and you cannot eat or drink, you may need a glucagon shot (injection). A family member or friend should learn how to check your blood sugar and how to give you a glucagon shot. Ask your doctor if you need to have a glucagon shot kit at home. Follow these instructions at home: Medicine  Take your insulin and diabetes medicines as told.  If your doctor says you should take more or less insulin or medicines, do this exactly as told.  Do not run out of insulin or  medicines. Food   Make healthy food choices. These include: ? Chicken, fish, egg whites, and beans. ? Oats, whole wheat, bulgur, brown rice, quinoa, and millet. ? Fresh fruits and vegetables. ? Low-fat dairy products. ? Nuts, avocado, olive oil, and canola oil.  Meet with a food specialist (dietitian). He or she can help you make an eating plan that is right for you.  Follow instructions from your doctor about what you cannot eat or drink.  Drink enough fluid to keep your pee (urine) pale yellow.  Eat healthy snacks between healthy meals.  Keep track of carbs that you eat. Do this by reading food labels and learning food serving sizes.  Follow your sick day plan when you cannot eat or drink normally. Make this plan with your doctor so it is ready to use. Activity  Exercise for 30 or more minutes a day, or as much as your doctor recommends.  Talk with your doctor before you start a new exercise or activity. Your doctor may need to tell you to change: ? How much insulin or medicines you take. ? How much food you eat. Lifestyle  Do not drink alcohol.  Do not use any tobacco products. These include cigarettes, chewing tobacco, and e-cigarettes. If you need help quitting, ask your doctor.  Learn how to deal with stress. If you need help with this, ask your doctor. Body care  Stay up to date with your shots (immunizations).  Brush your teeth and gums two times a day. Floss one or more times a day.  Go to the dentist one or more times every 6 months.  Stay at a healthy weight while you are pregnant. General instructions  Take over-the-counter and prescription medicines only as told by your doctor.  Ask your doctor about risks of high blood pressure in pregnancy (preeclampsia and eclampsia).  Share your diabetes care plan with: ? Your work or school. ? People you live with.  Check your pee for ketones: ? When you are sick. ? As told by your doctor.  Carry a card or  wear jewelry that says you have diabetes.  Keep all follow-up visits as told by your doctor. This is important. Care after giving birth  Have your blood sugar checked 4-12 weeks after you give birth.  Get checked for diabetes one or more times during 3 years. Questions to ask your doctor  Do I need to meet with a diabetes educator?  Where can I find a support group for people with gestational diabetes? Where to find more information To learn more about diabetes, visit:  American Diabetes Association: www.diabetes.org  Centers for Disease Control and Prevention (CDC): www.cdc.gov Summary  Check your blood sugar (glucose) every day while you are pregnant. Check it as often as told.  Take your insulin and diabetes medicines as told.  Keep all follow-up visits as   told by your doctor. This is important.  Have your blood sugar checked 4-12 weeks after you give birth. This information is not intended to replace advice given to you by your health care provider. Make sure you discuss any questions you have with your health care provider. Document Revised: 05/16/2018 Document Reviewed: 12/27/2015 Elsevier Patient Education  2020 [employer]  

## 2020-03-25 ENCOUNTER — Encounter (HOSPITAL_COMMUNITY): Payer: Self-pay | Admitting: *Deleted

## 2020-03-25 ENCOUNTER — Ambulatory Visit (HOSPITAL_COMMUNITY)
Admission: RE | Admit: 2020-03-25 | Discharge: 2020-03-25 | Disposition: A | Discharge status: Home / Self Care | Payer: Medicaid Other | Source: Ambulatory Visit | Attending: Obstetrics and Gynecology | Admitting: Obstetrics and Gynecology

## 2020-03-25 ENCOUNTER — Other Ambulatory Visit: Payer: Self-pay

## 2020-03-25 ENCOUNTER — Ambulatory Visit (HOSPITAL_COMMUNITY): Payer: Medicaid Other | Admitting: *Deleted

## 2020-03-25 DIAGNOSIS — O0990 Supervision of high risk pregnancy, unspecified, unspecified trimester: Secondary | ICD-10-CM | POA: Diagnosis present

## 2020-03-25 DIAGNOSIS — O24414 Gestational diabetes mellitus in pregnancy, insulin controlled: Secondary | ICD-10-CM

## 2020-03-25 DIAGNOSIS — O09523 Supervision of elderly multigravida, third trimester: Secondary | ICD-10-CM | POA: Diagnosis not present

## 2020-03-25 DIAGNOSIS — Z148 Genetic carrier of other disease: Secondary | ICD-10-CM

## 2020-03-25 DIAGNOSIS — O09213 Supervision of pregnancy with history of pre-term labor, third trimester: Secondary | ICD-10-CM

## 2020-03-25 DIAGNOSIS — O0943 Supervision of pregnancy with grand multiparity, third trimester: Secondary | ICD-10-CM

## 2020-03-25 DIAGNOSIS — Z3A32 32 weeks gestation of pregnancy: Secondary | ICD-10-CM

## 2020-03-29 ENCOUNTER — Telehealth (INDEPENDENT_AMBULATORY_CARE_PROVIDER_SITE_OTHER): Payer: Medicaid Other | Admitting: Obstetrics & Gynecology

## 2020-03-29 DIAGNOSIS — O24113 Pre-existing diabetes mellitus, type 2, in pregnancy, third trimester: Secondary | ICD-10-CM

## 2020-03-29 DIAGNOSIS — Z3A33 33 weeks gestation of pregnancy: Secondary | ICD-10-CM | POA: Diagnosis not present

## 2020-03-29 DIAGNOSIS — O24119 Pre-existing diabetes mellitus, type 2, in pregnancy, unspecified trimester: Secondary | ICD-10-CM

## 2020-03-29 DIAGNOSIS — O0990 Supervision of high risk pregnancy, unspecified, unspecified trimester: Secondary | ICD-10-CM

## 2020-03-29 NOTE — Patient Instructions (Signed)
Gestational Diabetes Mellitus, Diagnosis Gestational diabetes (gestational diabetes mellitus) is a short-term (temporary) form of diabetes that can happen during pregnancy. It goes away after you give birth. It may be caused by one or both of these problems:  Your pancreas does not make enough of a hormone called insulin.  Your body does not respond in a normal way to insulin that it makes. Insulin lets sugars (glucose) go into cells in the body. This gives you energy. If you have diabetes, sugars cannot get into cells. This causes high blood sugar (hyperglycemia). If you get gestational diabetes, you are:  More likely to get it if you get pregnant again.  More likely to develop type 2 diabetes in the future. If gestational diabetes is treated, it may not hurt you or your baby. Your doctor will set treatment goals for you. In general, you should have these blood sugar levels:  After not eating for a long time (fasting): 95 mg/dL (5.3 mmol/L).  After meals (postprandial): ? One hour after a meal: at or below 140 mg/dL (7.8 mmol/L). ? Two hours after a meal: at or below 120 mg/dL (6.7 mmol/L).  A1c (hemoglobin A1c) level: 6-6.5%. Follow these instructions at home: Questions to ask your doctor   You may want to ask these questions: ? Do I need to meet with a diabetes educator? ? What equipment will I need to care for myself at home? ? What medicines do I need? When should I take them? ? How often do I need to check my blood sugar? ? What number can I call if I have questions? ? When is my next doctor's visit? General instructions  Take over-the-counter and prescription medicines only as told by your doctor.  Stay at a healthy weight during pregnancy.  Keep all follow-up visits as told by your doctor. This is important. Contact a doctor if:  Your blood sugar is at or above 240 mg/dL (13.3 mmol/L).  Your blood sugar is at or above 200 mg/dL (11.1 mmol/L) and you have ketones in  your pee (urine).  You have been sick or have had a fever for 2 days or more and you are not getting better.  You have any of these problems for more than 6 hours: ? You cannot eat or drink. ? You feel sick to your stomach (nauseous). ? You throw up (vomit). ? You have watery poop (diarrhea). Get help right away if:  Your blood sugar is lower than 54 mg/dL (3 mmol/L).  You get confused.  You have trouble: ? Thinking clearly. ? Breathing.  Your baby moves less than normal.  You have any of these: ? Moderate or large ketone levels in your pee. ? Blood coming from your vagina. ? Unusual fluid coming from your vagina. ? Early contractions. These may feel like tightness in your belly. Summary  Gestational diabetes is a short-term form of diabetes. It can happen while you are pregnant. It goes away after you give birth.  If gestational diabetes is treated, it may not hurt you or your baby. Your doctor will set treatment goals for you.  Keep all follow-up visits as told by your doctor. This is important. This information is not intended to replace advice given to you by your health care provider. Make sure you discuss any questions you have with your health care provider. Document Revised: 12/30/2017 Document Reviewed: 12/27/2015 Elsevier Patient Education  2020 [employer]  

## 2020-03-29 NOTE — Progress Notes (Signed)
Pt states that she has stopped Insulin 3 days ago and her sugars have been normal. Pt states she has been on a low carb diet.  Pt is being [facility] at [facility] and has BP checked there- normal BP. Pt does not have BP cuff at home.

## 2020-03-29 NOTE — Progress Notes (Signed)
   TELEHEALTH VIRTUAL OBSTETRICS VISIT ENCOUNTER NOTE  I connected with Shelly Lane on 03/29/20 at 11:15 AM EDT by telephone at home and verified that I am speaking with the correct person using two identifiers.   I discussed the limitations, risks, security and privacy concerns of performing an evaluation and management service by telephone and the availability of in person appointments. I also discussed with the patient that there may be a patient responsible charge related to this service. The patient expressed understanding and agreed to proceed.  Subjective:  Shelly Lane is a 44 y.o. [id number] at [redacted]w[redacted]d being followed for ongoing prenatal care.  She is currently monitored for the following issues for this high-risk pregnancy and has OBESITY; ANXIETY; BULIMIA; Depression affecting pregnancy, antepartum; Advanced maternal age in multigravida; SAB (spontaneous abortion) x 4; Grand multipara; Hx of preterm delivery x 2, currently pregnant; Hx gestational diabetes x 3; Multiple drug allergies; History of abnormal cervical Pap smear; DM (diabetes mellitus), type 2 (HCC); Sciatic nerve pain, right; Pregnancy; History of postpartum depression; Chronic pain; Supervision of high risk pregnancy, antepartum; Alpha thalassemia silent carrier; and Diabetes mellitus during pregnancy, antepartum on their problem list.  Patient reports no complaints. Reports fetal movement. Denies any contractions, bleeding or leaking of fluid.   The following portions of the patient's history were reviewed and updated as appropriate: allergies, current medications, past family history, past medical history, past social history, past surgical history and problem list.   Objective:   General:  Alert, oriented and cooperative.   Mental Status: Normal mood and affect perceived. Normal judgment and thought content.  Rest of physical exam deferred due to type of encounter  Assessment and Plan:  Pregnancy: [id number] at  [redacted]w[redacted]d 1. Supervision of high risk pregnancy, antepartum Good fetal surveillance with BPP, growth [place]  2. Pre-existing type 2 diabetes mellitus during pregnancy, antepartum FBS <90 and PP <120 diet controlled, stopped insulin  Preterm labor symptoms and general obstetric precautions including but not limited to vaginal bleeding, contractions, leaking of fluid and fetal movement were reviewed in detail with the patient.  I discussed the assessment and treatment plan with the patient. The patient was provided an opportunity to ask questions and all were answered. The patient agreed with the plan and demonstrated an understanding of the instructions. The patient was advised to call back or seek an in-person office evaluation/go to [facility] at [facility] for any urgent or concerning symptoms. Please refer to After Visit Summary for other counseling recommendations.   I provided 12 minutes of non-face-to-face time during this encounter.  2 week f/u  Future Appointments  Date Time Provider Department Center  03/29/2020 11:15 AM Adam Phenix, MD [facility] None  04/01/2020  3:45 PM [facility] NURSE [facility]  04/01/2020  3:45 PM [facility] [place] 3 [facility]  04/09/2020  3:45 PM [facility] NURSE [facility]  04/09/2020  3:45 PM [facility] [place] 3 [facility]  04/15/2020  3:15 PM [facility] NURSE [facility]  04/15/2020  3:45 PM [facility] [place] 3 [facility]  04/22/2020  3:45 PM [facility] NURSE [facility]  04/22/2020  3:45 PM [facility] [place] 4 [facility]-US  04/29/2020  3:45 PM [facility] NURSE [facility]  04/29/2020  3:45 PM [facility] [place] 4 [facility]    Scheryl Darter, MD [facility] [employer], [facility]

## 2020-04-01 ENCOUNTER — Ambulatory Visit (HOSPITAL_COMMUNITY): Payer: Medicaid Other | Admitting: *Deleted

## 2020-04-01 ENCOUNTER — Encounter (HOSPITAL_COMMUNITY): Payer: Self-pay

## 2020-04-01 ENCOUNTER — Ambulatory Visit (HOSPITAL_COMMUNITY)
Admission: RE | Admit: 2020-04-01 | Discharge: 2020-04-01 | Disposition: A | Discharge status: Home / Self Care | Payer: Medicaid Other | Source: Ambulatory Visit | Attending: Obstetrics and Gynecology | Admitting: Obstetrics and Gynecology

## 2020-04-01 ENCOUNTER — Other Ambulatory Visit: Payer: Self-pay

## 2020-04-01 DIAGNOSIS — O0940 Supervision of pregnancy with grand multiparity, unspecified trimester: Secondary | ICD-10-CM | POA: Diagnosis not present

## 2020-04-01 DIAGNOSIS — O0990 Supervision of high risk pregnancy, unspecified, unspecified trimester: Secondary | ICD-10-CM

## 2020-04-01 DIAGNOSIS — O09213 Supervision of pregnancy with history of pre-term labor, third trimester: Secondary | ICD-10-CM | POA: Diagnosis not present

## 2020-04-01 DIAGNOSIS — Z148 Genetic carrier of other disease: Secondary | ICD-10-CM

## 2020-04-01 DIAGNOSIS — O09523 Supervision of elderly multigravida, third trimester: Secondary | ICD-10-CM | POA: Insufficient documentation

## 2020-04-01 DIAGNOSIS — O24414 Gestational diabetes mellitus in pregnancy, insulin controlled: Secondary | ICD-10-CM

## 2020-04-01 DIAGNOSIS — Z3A33 33 weeks gestation of pregnancy: Secondary | ICD-10-CM

## 2020-04-09 ENCOUNTER — Other Ambulatory Visit: Payer: Self-pay

## 2020-04-09 ENCOUNTER — Ambulatory Visit: Payer: Medicaid Other | Admitting: *Deleted

## 2020-04-09 ENCOUNTER — Ambulatory Visit (HOSPITAL_COMMUNITY): Payer: Medicaid Other | Attending: Obstetrics and Gynecology

## 2020-04-09 DIAGNOSIS — O09523 Supervision of elderly multigravida, third trimester: Secondary | ICD-10-CM

## 2020-04-09 DIAGNOSIS — O0990 Supervision of high risk pregnancy, unspecified, unspecified trimester: Secondary | ICD-10-CM

## 2020-04-09 DIAGNOSIS — O24113 Pre-existing diabetes mellitus, type 2, in pregnancy, third trimester: Secondary | ICD-10-CM | POA: Insufficient documentation

## 2020-04-09 DIAGNOSIS — Z3A35 35 weeks gestation of pregnancy: Secondary | ICD-10-CM | POA: Diagnosis not present

## 2020-04-12 ENCOUNTER — Telehealth (INDEPENDENT_AMBULATORY_CARE_PROVIDER_SITE_OTHER): Payer: Medicaid Other | Admitting: Obstetrics and Gynecology

## 2020-04-12 ENCOUNTER — Telehealth: Payer: Medicaid Other | Admitting: Family Medicine

## 2020-04-12 DIAGNOSIS — Z3A35 35 weeks gestation of pregnancy: Secondary | ICD-10-CM

## 2020-04-12 DIAGNOSIS — O24119 Pre-existing diabetes mellitus, type 2, in pregnancy, unspecified trimester: Secondary | ICD-10-CM

## 2020-04-12 DIAGNOSIS — F32A Depression, unspecified: Secondary | ICD-10-CM

## 2020-04-12 DIAGNOSIS — O09523 Supervision of elderly multigravida, third trimester: Secondary | ICD-10-CM

## 2020-04-12 DIAGNOSIS — O0993 Supervision of high risk pregnancy, unspecified, third trimester: Secondary | ICD-10-CM

## 2020-04-12 DIAGNOSIS — F329 Major depressive disorder, single episode, unspecified: Secondary | ICD-10-CM

## 2020-04-12 DIAGNOSIS — O24113 Pre-existing diabetes mellitus, type 2, in pregnancy, third trimester: Secondary | ICD-10-CM

## 2020-04-12 DIAGNOSIS — O99343 Other mental disorders complicating pregnancy, third trimester: Secondary | ICD-10-CM

## 2020-04-12 DIAGNOSIS — G8921 Chronic pain due to trauma: Secondary | ICD-10-CM

## 2020-04-12 DIAGNOSIS — O0990 Supervision of high risk pregnancy, unspecified, unspecified trimester: Secondary | ICD-10-CM

## 2020-04-12 NOTE — Progress Notes (Signed)
OBSTETRICS PRENATAL VIRTUAL VISIT ENCOUNTER NOTE  Provider location: [facility]   I connected with [name] on 04/12/20 at 10:45 AM EDT by MyChart Video Encounter at home and verified that I am speaking with the correct person using two identifiers.   I discussed the limitations, risks, security and privacy concerns of performing an evaluation and management service virtually and the availability of in person appointments. I also discussed with the patient that there may be a patient responsible charge related to this service. The patient expressed understanding and agreed to proceed. Subjective:  Ruthmary Occhipinti is a 44 y.o. [id number] at [redacted]w[redacted]d being seen today for ongoing prenatal care.  She is currently monitored for the following issues for this high-risk pregnancy and has OBESITY; ANXIETY; BULIMIA; Depression affecting pregnancy, antepartum; Advanced maternal age in multigravida; SAB (spontaneous abortion) x 4; Grand multipara; Hx of preterm delivery x 2, currently pregnant; Hx gestational diabetes x 3; Multiple drug allergies; History of abnormal cervical Pap smear; DM (diabetes mellitus), type 2 (HCC); Sciatic nerve pain, right; Pregnancy; History of postpartum depression; Chronic pain; Supervision of high risk pregnancy, antepartum; Alpha thalassemia silent carrier; and Diabetes mellitus during pregnancy, antepartum on their problem list.  Patient reports no complaints.  Contractions: Irregular. Vag. Bleeding: None.  Movement: Present. Denies any leaking of fluid.   The following portions of the patient's history were reviewed and updated as appropriate: allergies, current medications, past family history, past medical history, past social history, past surgical history and problem list.   Objective:  There were no vitals filed for this visit.  Fetal Status:     Movement: Present     General:  Alert, oriented and cooperative. Patient is in no acute  distress.  Respiratory: Normal respiratory effort, no problems with respiration noted  Mental Status: Normal mood and affect. Normal behavior. Normal judgment and thought content.  Rest of physical exam deferred due to type of encounter  Imaging: [place] MFM FETAL BPP WO NON STRESS  Result Date: 04/10/2020 ----------------------------------------------------------------------  OBSTETRICS REPORT                         (Signed Final 04/10/2020 12:48 pm) ---------------------------------------------------------------------- Patient Info  ID #:        [id number]                          D.O.B.:  08-Nov-1976 (44 yrs)  Name:        Shelly Lane          Visit Date: 04/09/2020 03:58 pm ---------------------------------------------------------------------- Performed By  Attending:         Lin Landsman      Ref. Address:      [address]                     [address]                                        [address]  [address]                                                               [place]                                                               [address]  Performed By:      Hurman Horn RDMS     [facility]  Referred By:       [facility] ---------------------------------------------------------------------- Orders  #   Description                          Code         Ordered By  1   [place] MFM FETAL BPP WO NON              76819.01     YU FANG      STRESS ----------------------------------------------------------------------  #   Order #                    Accession #                 Episode #  1   [id number]                  [id number]                  [id number] ---------------------------------------------------------------------- Indications  [redacted] weeks gestation of pregnancy                 Z3A.35  Gestational diabetes in pregnancy, insulin       O24.414  controlled  Advanced maternal age multigravida 42+, third   O81.523  trimester  Grand multiparity, antepartum                   O09.40  Poor obstetric history: Previous preterm        O09.219  delivery, antepartum (36 wks)  Genetic carrier (alpha thal)                    Z14.8 ---------------------------------------------------------------------- Fetal Evaluation  Num Of Fetuses:          1  Fetal Heart Rate(bpm):   146  Cardiac Activity:        Observed  Presentation:            Cephalic  Placenta:                Anterior  P. Cord Insertion:       Visualized, central  Amniotic Fluid  AFI FV:      Within normal limits  AFI Sum(cm)     %Tile       Largest Pocket(cm)  18.4            68          6.2  RUQ(cm)       RLQ(cm)        LUQ(cm)        LLQ(cm)  3             4.8            4.4            6.2  Comment:     Stomach, bladder, and diaphragm noted. ---------------------------------------------------------------------- Biophysical Evaluation  Amniotic F.V:   Within normal limits        F. Tone:         Observed  F. Movement:    Observed                    Score:           8/8  F. Breathing:   Observed ---------------------------------------------------------------------- OB History  Gravidity:     13        Term:  8          Prem:  2        SAB:   2  TOP:           0       Ectopic: 0         Living: 10 ---------------------------------------------------------------------- Gestational Age  Best:           35w 0d    Det. ByMarcella Dubs           EDD:  05/14/20                                      (11/01/19) ---------------------------------------------------------------------- Impression  Antenatal testing for A2GDM  Advanced maternal age  Biophysical profile 8/8  Good fetal movement and amniotic fluid ---------------------------------------------------------------------- Recommendations  Follow up BPP and growth in 1 week. ----------------------------------------------------------------------                Lin Landsman, MD Electronically Signed Final Report   04/10/2020 12:48 pm ----------------------------------------------------------------------  [place] MFM FETAL BPP WO NON STRESS  Result Date: 04/01/2020 ----------------------------------------------------------------------  OBSTETRICS REPORT                       (Signed Final 04/01/2020 05:27 pm) ---------------------------------------------------------------------- Patient Info  ID #:       [id number]                          D.O.B.:  1976/11/20 (44 yrs)  Name:       Shelly Lane          Visit Date: 04/01/2020 03:58 pm ---------------------------------------------------------------------- Performed By  Performed By:     Eden Lathe BS      Ref. Address:     [address]                    RDMS RVT  [address]                                                             [address]                                                             [place]                                                             [address]  Attending:        Ma Rings MD         [facility]  Referred By:      [facility] ---------------------------------------------------------------------- Orders   #  Description                          Code         Ordered By   1  [place] MFM FETAL BPP WO NON              76819.01     RAVI [facility]      STRESS  ----------------------------------------------------------------------   #  Order #                    Accession #                 Episode #   1  [id number]                  [id number]                  [id number]  ---------------------------------------------------------------------- Indications   Gestational diabetes in pregnancy, insulin     O24.414   controlled   Advanced maternal age multigravida 6+,        O73.523   third trimester   Grand multiparity, antepartum                   O09.40   [redacted] weeks gestation of pregnancy                Z3A.33   Poor obstetric history: Previous preterm       O09.219   delivery, antepartum (36 wks)   Genetic carrier (alpha thal)                   Z14.8  ---------------------------------------------------------------------- Fetal Evaluation  Num Of Fetuses:  1  Fetal Heart Rate(bpm):  158  Cardiac Activity:       Observed  Presentation:           Cephalic  Amniotic Fluid  AFI FV:      Within normal limits  AFI Sum(cm)     %Tile       Largest Pocket(cm)  11.86           33          4.03  RUQ(cm)       RLQ(cm)       LUQ(cm)        LLQ(cm)  3.91          3.05          0.87           4.03 ---------------------------------------------------------------------- Biophysical Evaluation  Amniotic F.V:   Within normal limits       F. Tone:        Observed  F. Movement:    Observed                   Score:          8/8  F. Breathing:   Observed ---------------------------------------------------------------------- OB History  Gravidity:    13        Term:   8        Prem:   2        SAB:   2  TOP:          0       Ectopic:  0        Living: 10 ---------------------------------------------------------------------- Gestational Age  Best:          33w 6d     Det. By:  Marcella Dubs         EDD:   05/14/20                                      (11/01/19) ---------------------------------------------------------------------- Comments  This patient was seen for a biophysical profile due to insulin  controlled gestational diabetes.  She denies any problems  since her last exam.  A biophysical profile performed today was 8 out of 8.  There was normal amniotic fluid noted on today's ultrasound  exam.  Another biophysical profile was scheduled in 1 week. ----------------------------------------------------------------------                   Ma Rings, MD Electronically Signed Final Report   04/01/2020 05:27 pm  ----------------------------------------------------------------------  [place] MFM FETAL BPP WO NON STRESS  Result Date: 03/25/2020 ----------------------------------------------------------------------  OBSTETRICS REPORT                       (Signed Final 03/25/2020 04:45 pm) ---------------------------------------------------------------------- Patient Info  ID #:       [id number]                          D.O.B.:  07-22-1976 (44 yrs)  Name:       Shelly Lane          Visit Date: 03/25/2020 03:51 pm ---------------------------------------------------------------------- Performed By  Performed By:     Marcellina Millin          Ref. Address:     [address]  RDMS                                                             [address]                                                             [address]                                                             [place]                                                             [address]  Attending:        Ma Rings MD         [facility]  Referred By:      [facility] ---------------------------------------------------------------------- Orders   #  Description                          Code         Ordered By   1  [place] MFM FETAL BPP WO NON              76819.01     RAVI [facility]      STRESS  ----------------------------------------------------------------------   #  Order #                    Accession #                 Episode #   1  [id number]                  [id number]                  [id number]  ---------------------------------------------------------------------- Indications   Gestational diabetes in pregnancy, insulin     O24.414   controlled   [redacted] weeks gestation of pregnancy                Z3A.32   Grand multiparity, antepartum                  O09.40   Poor obstetric history: Previous preterm       O09.219   delivery, antepartum (  36 wks)    Genetic carrier (alpha thal)                   Z14.8   Advanced maternal age multigravida 75+,        O32.523   third trimester  ---------------------------------------------------------------------- Fetal Evaluation  Num Of Fetuses:         1  Fetal Heart Rate(bpm):  140  Cardiac Activity:       Observed  Presentation:           Cephalic  Placenta:               Anterior  P. Cord Insertion:      Previously Visualized  Amniotic Fluid  AFI FV:      Within normal limits  AFI Sum(cm)     %Tile       Largest Pocket(cm)  15.93           57          4.89  RUQ(cm)       RLQ(cm)       LUQ(cm)        LLQ(cm)  4.89          4.74          3.15           3.15 ---------------------------------------------------------------------- Biophysical Evaluation  Amniotic F.V:   Within normal limits       F. Tone:        Observed  F. Movement:    Observed                   Score:          8/8  F. Breathing:   Observed ---------------------------------------------------------------------- OB History  Gravidity:    13        Term:   8        Prem:   2        SAB:   2  TOP:          0       Ectopic:  0        Living: 10 ---------------------------------------------------------------------- Gestational Age  Best:          32w 6d     Det. By:  Marcella Dubs         EDD:   05/14/20                                      (11/01/19) ---------------------------------------------------------------------- Anatomy  Stomach:               Appears normal, left   Bladder:                Appears normal                         sided ---------------------------------------------------------------------- Comments  This patient had a biophysical profile performed today due to  insulin controlled gestational diabetes.  A biophysical profile performed today was 8 out of 8.  There was normal amniotic fluid noted on today's ultrasound  exam.  She will return in 1 week for another biophysical profile.  ----------------------------------------------------------------------                   Ma Rings, MD Electronically Signed Final Report   03/25/2020 04:45 pm ----------------------------------------------------------------------  [place]  MFM FETAL BPP WO NON STRESS  Result Date: 03/18/2020 ----------------------------------------------------------------------  OBSTETRICS REPORT                       (Signed Final 03/18/2020 04:36 pm) ---------------------------------------------------------------------- Patient Info  ID #:       [id number]                          D.O.B.:  November 12, 1976 (44 yrs)  Name:       Shelly Lane          Visit Date: 03/18/2020 03:57 pm ---------------------------------------------------------------------- Performed By  Performed By:     Percell Boston          Ref. Address:     [address]                                                             [address]                                                             [place]                                                             [address]  Attending:        Ma Rings MD         [facility]  Referred By:      [facility] ---------------------------------------------------------------------- Orders   #  Description                          Code         Ordered By   1  [place] MFM FETAL BPP WO NON              37106.26     RAVI SHANKAR      STRESS   2  [place] MFM OB FOLLOW UP  97673.41     RAVI SHANKAR  ----------------------------------------------------------------------   #  Order #                    Accession #                 Episode #   1  [id number]                  [id number]                  [id number]   2  [id number]                  [id number]                  [id number]   ---------------------------------------------------------------------- Indications   Gestational diabetes in pregnancy, insulin     O24.414   controlled   [redacted] weeks gestation of pregnancy                Z3A.31   Encounter for other antenatal screening        Z36.2   follow-up   Advanced maternal age multigravida 74+,        O84.522   second trimester   Grand multiparity, antepartum                  O09.40   Poor obstetric history: Previous preterm       O09.219   delivery, antepartum (36 wks)   Genetic carrier (alpha thal)                   Z14.8   Obesity complicating pregnancy, second         O99.212   trimester  ---------------------------------------------------------------------- Fetal Evaluation  Num Of Fetuses:         1  Fetal Heart Rate(bpm):  146  Cardiac Activity:       Observed  Presentation:           Cephalic  Placenta:               Anterior  P. Cord Insertion:      Previously Visualized  Amniotic Fluid  AFI FV:      Within normal limits  AFI Sum(cm)     %Tile       Largest Pocket(cm)  18.5            69          6.43  RUQ(cm)       RLQ(cm)       LUQ(cm)        LLQ(cm)  6.43          4.42          4.05           3.6 ---------------------------------------------------------------------- Biophysical Evaluation  Amniotic F.V:   Within normal limits       F. Tone:        Observed  F. Movement:    Observed                   Score:          8/8  F. Breathing:   Observed ---------------------------------------------------------------------- Biometry  BPD:      75.9  mm     G. Age:  30w 3d          8  %    CI:  66.3   %    70 - 86                                                          FL/HC:      20.9   %    19.1 - 21.3  HC:      298.9  mm     G. Age:  33w 1d         46  %    HC/AC:      1.06        0.96 - 1.17  AC:      281.6  mm     G. Age:  32w 1d         59  %    FL/BPD:     82.3   %    71 - 87  FL:       62.5  mm     G. Age:  32w 3d         51  %    FL/AC:      22.2   %    20 - 24  Est. FW:     1920  gm      4 lb 4 oz     49  % ---------------------------------------------------------------------- OB History  Gravidity:    13        Term:   8        Prem:   2        SAB:   2  TOP:          0       Ectopic:  0        Living: 10 ---------------------------------------------------------------------- Gestational Age  U/S Today:     32w 0d                                        EDD:   05/13/20  Best:          31w 6d     Det. By:  Marcella Dubs         EDD:   05/14/20                                      (11/01/19) ---------------------------------------------------------------------- Anatomy  Cranium:               Appears normal         Aortic Arch:            Previously seen  Cavum:                 Previously seen        Ductal Arch:            Previously seen  Ventricles:            Appears normal         Diaphragm:              Previously seen  Choroid Plexus:        Previously seen        Stomach:  Appears normal, left                                                                        sided  Cerebellum:            Previously seen        Abdomen:                Previously seen  Posterior Fossa:       Previously seen        Abdominal Wall:         Previously seen  Nuchal Fold:           Not applicable (>20    Cord Vessels:           Previously seen                         wks GA)  Face:                  Orbits and profile     Kidneys:                Appear normal                         previously seen  Lips:                  Previously seen        Bladder:                Appears normal  Thoracic:              Appears normal         Spine:                  Previously seen  Heart:                 Echogenic focus        Upper Extremities:      Previously seen                         in LV  RVOT:                  Appears normal         Lower Extremities:      Previously seen  LVOT:                  Previously seen  Other:  Heels and 5th digit visualized previously. Open hands visualized           previously. Nasal bone visualized previously. ---------------------------------------------------------------------- Cervix Uterus Adnexa  Cervix  Not visualized (advanced GA >24wks) ---------------------------------------------------------------------- Comments  This patient was seen for a follow up growth scan due to  gestational diabetes that is currently controlled with insulin.  She reports that her fingerstick values have been within  normal limits and denies any problems since her last exam.  She was informed that the fetal growth and amniotic fluid  level appears appropriate for her gestational age.  A biophysical profile performed today was 8 out of 8.  Another biophysical profile was scheduled in 1 week. ----------------------------------------------------------------------                   Ma RingsVictor Fang, MD Electronically Signed Final Report   03/18/2020 04:36 pm ----------------------------------------------------------------------  US MFM OB FOLLOW UP  Result Date: 03/18/2020 ----------------------------------------------------------------------  OBSTETRICS REPORT                       (Signed Final 03/18/2020 04:36 pm) ---------------------------------------------------------------------- Patient Info  ID #:       [id number]                          D.O.B.:  06/01/1976 (44 yrs)  Name:       Shelly MorrowLINDA WILLI Bielby          Visit Date: 03/18/2020 03:57 pm ---------------------------------------------------------------------- Performed By  Performed By:     Percell BostonHeather Waken          Ref. Address:     [address]                    [address]                                                             [address]                                                             [address]                                                             [place]                                                             [id number]  Attending:        Ma RingsVictor Fang MD         [facility]  [facility] ---------------------------------------------------------------------- Orders   #  Description                          Code         Ordered By   1  US MFM FETAL  BPP WO NON              76819.01     RAVI SHANKAR      STRESS   2  [place] MFM OB FOLLOW UP                  [id number]     RAVI SHANKAR  ----------------------------------------------------------------------   #  Order #                    Accession #                 Episode #   1  [id number]                  [id number]   2  [id number]                  [id number]                  [id number]  ---------------------------------------------------------------------- Indications   Gestational diabetes in pregnancy, insulin     O24.414   controlled   [redacted] weeks gestation of pregnancy                Z3A.31   Encounter for other antenatal screening        Z36.2   follow-up   Advanced maternal age multigravida 75+,        O20.522   second trimester   Grand multiparity, antepartum                  O09.40   Poor obstetric history: Previous preterm       O09.219   delivery, antepartum (36 wks)   Genetic carrier (alpha thal)                   R74.0   Obesity complicating pregnancy, second         O99.212   trimester  ---------------------------------------------------------------------- Fetal Evaluation  Num Of Fetuses:         1  Fetal Heart Rate(bpm):  146  Cardiac Activity:       Observed  Presentation:           Cephalic  Placenta:               Anterior  P. Cord Insertion:      Previously Visualized  Amniotic Fluid  AFI FV:      Within normal limits  AFI Sum(cm)     %Tile       Largest Pocket(cm)  18.5            69          6.43  RUQ(cm)       RLQ(cm)       LUQ(cm)        LLQ(cm)  6.43          4.42          4.05           3.6 ---------------------------------------------------------------------- Biophysical Evaluation  Amniotic F.V:   Within normal limits       F.  Tone:        Observed  F. Movement:    Observed                   Score:  8/8  F. Breathing:   Observed ---------------------------------------------------------------------- Biometry  BPD:      75.9  mm     G. Age:  30w 3d          8  %    CI:         66.3   %    70 - 86                                                          FL/HC:      20.9   %    19.1 - 21.3  HC:      298.9  mm     G. Age:  33w 1d         46  %    HC/AC:      1.06        0.96 - 1.17  AC:      281.6  mm     G. Age:  32w 1d         59  %    FL/BPD:     82.3   %    71 - 87  FL:       62.5  mm     G. Age:  32w 3d         51  %    FL/AC:      22.2   %    20 - 24  Est. FW:    1920  gm      4 lb 4 oz     49  % ---------------------------------------------------------------------- OB History  Gravidity:    13        Term:   8        Prem:   2        SAB:   2  TOP:          0       Ectopic:  0        Living: 10 ---------------------------------------------------------------------- Gestational Age  U/S Today:     32w 0d                                        EDD:   05/13/20  Best:          31w 6d     Det. By:  Marcella Dubs         EDD:   05/14/20                                      (11/01/19) ---------------------------------------------------------------------- Anatomy  Cranium:               Appears normal         Aortic Arch:            Previously seen  Cavum:                 Previously seen        Ductal Arch:            Previously seen  Ventricles:  Appears normal         Diaphragm:              Previously seen  Choroid Plexus:        Previously seen        Stomach:                Appears normal, left                                                                        sided  Cerebellum:            Previously seen        Abdomen:                Previously seen  Posterior Fossa:       Previously seen        Abdominal Wall:         Previously seen  Nuchal Fold:           Not applicable (>20    Cord Vessels:            Previously seen                         wks GA)  Face:                  Orbits and profile     Kidneys:                Appear normal                         previously seen  Lips:                  Previously seen        Bladder:                Appears normal  Thoracic:              Appears normal         Spine:                  Previously seen  Heart:                 Echogenic focus        Upper Extremities:      Previously seen                         in LV  RVOT:                  Appears normal         Lower Extremities:      Previously seen  LVOT:                  Previously seen  Other:  Heels and 5th digit visualized previously. Open hands visualized          previously. Nasal bone visualized previously. ---------------------------------------------------------------------- Cervix Uterus Adnexa  Cervix  Not visualized (advanced GA >24wks) ---------------------------------------------------------------------- Comments  This patient was seen for a follow up growth scan due  to  gestational diabetes that is currently controlled with insulin.  She reports that her fingerstick values have been within  normal limits and denies any problems since her last exam.  She was informed that the fetal growth and amniotic fluid  level appears appropriate for her gestational age.  A biophysical profile performed today was 8 out of 8.  Another biophysical profile was scheduled in 1 week. ----------------------------------------------------------------------                   Ma Rings, MD Electronically Signed Final Report   03/18/2020 04:36 pm ----------------------------------------------------------------------   Assessment and Plan:  Pregnancy: [id number] at [redacted]w[redacted]d 1. Supervision of high risk pregnancy, antepartum Patient is doing well without complaints Patient is interested in waterbirth and has taken the class. Will have her see midwife next week to determine eligibility  2. Pre-existing type 2 diabetes mellitus  during pregnancy, antepartum Patient reports fasting as high as 89 and all pp within range since changing to a keto diet and discontinuing insulin Growth ultrasound next week  3. Chronic pain due to trauma   4. Multigravida of advanced maternal age in third trimester   5. Depression affecting pregnancy, antepartum Stable without medication Patient declined referral to [facility] as she thought they were going to put her on medication. Patient does not want to be on any medication during pregnancy or pp  Preterm labor symptoms and general obstetric precautions including but not limited to vaginal bleeding, contractions, leaking of fluid and fetal movement were reviewed in detail with the patient. I discussed the assessment and treatment plan with the patient. The patient was provided an opportunity to ask questions and all were answered. The patient agreed with the plan and demonstrated an understanding of the instructions. The patient was advised to call back or seek an in-person office evaluation/go to MAU at [facility] for any urgent or concerning symptoms. Please refer to After Visit Summary for other counseling recommendations.   I provided 15 minutes of face-to-face time during this encounter.  No follow-ups on file.  Future Appointments  Date Time Provider Department Center  04/12/2020 10:45 AM Markon Jares, Gigi Gin, MD [facility] None  04/15/2020  3:15 PM [facility] NURSE [facility]  04/15/2020  3:45 PM [facility] US3 [facility]  04/22/2020  3:45 PM [facility] NURSE [facility]  04/22/2020  3:45 PM [facility] US4 [facility]  04/29/2020  3:45 PM [facility] NURSE [facility]  04/29/2020  3:45 PM [facility] US4 [facility]    Catalina Antigua, MD Center for [employer], [facility]

## 2020-04-15 ENCOUNTER — Ambulatory Visit (HOSPITAL_COMMUNITY): Payer: Medicaid Other | Attending: Obstetrics and Gynecology

## 2020-04-15 ENCOUNTER — Other Ambulatory Visit: Payer: Self-pay

## 2020-04-15 ENCOUNTER — Ambulatory Visit: Payer: Medicaid Other | Admitting: *Deleted

## 2020-04-15 ENCOUNTER — Encounter: Payer: Self-pay | Admitting: *Deleted

## 2020-04-15 DIAGNOSIS — O0943 Supervision of pregnancy with grand multiparity, third trimester: Secondary | ICD-10-CM | POA: Diagnosis not present

## 2020-04-15 DIAGNOSIS — O0990 Supervision of high risk pregnancy, unspecified, unspecified trimester: Secondary | ICD-10-CM | POA: Diagnosis present

## 2020-04-15 DIAGNOSIS — O24414 Gestational diabetes mellitus in pregnancy, insulin controlled: Secondary | ICD-10-CM

## 2020-04-15 DIAGNOSIS — Z148 Genetic carrier of other disease: Secondary | ICD-10-CM

## 2020-04-15 DIAGNOSIS — O09523 Supervision of elderly multigravida, third trimester: Secondary | ICD-10-CM

## 2020-04-15 DIAGNOSIS — O24113 Pre-existing diabetes mellitus, type 2, in pregnancy, third trimester: Secondary | ICD-10-CM | POA: Insufficient documentation

## 2020-04-15 DIAGNOSIS — Z3A35 35 weeks gestation of pregnancy: Secondary | ICD-10-CM

## 2020-04-15 DIAGNOSIS — O09213 Supervision of pregnancy with history of pre-term labor, third trimester: Secondary | ICD-10-CM | POA: Diagnosis not present

## 2020-04-21 ENCOUNTER — Other Ambulatory Visit: Payer: Self-pay

## 2020-04-21 ENCOUNTER — Encounter (HOSPITAL_COMMUNITY): Payer: Self-pay | Admitting: Obstetrics & Gynecology

## 2020-04-21 ENCOUNTER — Inpatient Hospital Stay (HOSPITAL_COMMUNITY)
Admission: AD | Admit: 2020-04-21 | Discharge: 2020-04-21 | Disposition: A | Discharge status: Home / Self Care | Payer: Medicaid Other | Attending: Obstetrics & Gynecology | Admitting: Obstetrics & Gynecology

## 2020-04-21 DIAGNOSIS — O4703 False labor before 37 completed weeks of gestation, third trimester: Secondary | ICD-10-CM | POA: Insufficient documentation

## 2020-04-21 DIAGNOSIS — Z3A36 36 weeks gestation of pregnancy: Secondary | ICD-10-CM | POA: Diagnosis not present

## 2020-04-21 DIAGNOSIS — O479 False labor, unspecified: Secondary | ICD-10-CM

## 2020-04-21 NOTE — MAU Note (Signed)
Pt reports to MAU complaining of CTX every 3 minutes. Pt states CTX began at 10 pm. No LOF or vaginal bleeding. +FM.

## 2020-04-21 NOTE — MAU Provider Note (Signed)
S: [name] is a 44 y.o. [id number] at [redacted]w[redacted]d  who presents to MAU today for labor evaluation. Nurse reports patient initial vaginal exam is fingertip and patient tearful (due to back pain) and requests discharge.  Fetal tracing reassuring, but not consistent/long enough to call reactive.    Cervical exam by RN:  Dilation: Fingertip Effacement (%): Thick Cervical Position: Posterior Station: Ballotable Presentation: Undeterminable Exam by:: Camelia Eng, RN  Fetal Monitoring: Baseline: 150 Variability: Moderate Accelerations: Present 10x10 Decelerations: None Contractions: Irregular  MDM Discussed patient with RN. NST reviewed.   A: SIUP at [redacted]w[redacted]d  False labor  P: Discharge home Labor precautions and kick counts included in AVS Patient to follow-up with primary office as scheduled  Patient may return to MAU as needed or when in labor   Gerrit Heck, [place] 04/21/2020 4:32 AM

## 2020-04-21 NOTE — Discharge Instructions (Signed)

## 2020-04-22 ENCOUNTER — Encounter: Payer: Self-pay | Admitting: *Deleted

## 2020-04-22 ENCOUNTER — Other Ambulatory Visit: Payer: Self-pay

## 2020-04-22 ENCOUNTER — Other Ambulatory Visit (HOSPITAL_COMMUNITY)
Admission: RE | Admit: 2020-04-22 | Discharge: 2020-04-22 | Disposition: A | Discharge status: Home / Self Care | Payer: Medicaid Other | Source: Ambulatory Visit | Attending: Advanced Practice Midwife | Admitting: Advanced Practice Midwife

## 2020-04-22 ENCOUNTER — Ambulatory Visit: Payer: Medicaid Other | Admitting: *Deleted

## 2020-04-22 ENCOUNTER — Ambulatory Visit (INDEPENDENT_AMBULATORY_CARE_PROVIDER_SITE_OTHER): Payer: Medicaid Other | Admitting: Advanced Practice Midwife

## 2020-04-22 ENCOUNTER — Ambulatory Visit (HOSPITAL_COMMUNITY): Payer: Medicaid Other | Attending: Obstetrics and Gynecology

## 2020-04-22 VITALS — BP 119/80 | HR 118 | Wt 288.0 lb

## 2020-04-22 DIAGNOSIS — O0990 Supervision of high risk pregnancy, unspecified, unspecified trimester: Secondary | ICD-10-CM

## 2020-04-22 DIAGNOSIS — M5431 Sciatica, right side: Secondary | ICD-10-CM

## 2020-04-22 DIAGNOSIS — O24113 Pre-existing diabetes mellitus, type 2, in pregnancy, third trimester: Secondary | ICD-10-CM | POA: Diagnosis not present

## 2020-04-22 DIAGNOSIS — Z3A36 36 weeks gestation of pregnancy: Secondary | ICD-10-CM | POA: Diagnosis not present

## 2020-04-22 DIAGNOSIS — O24119 Pre-existing diabetes mellitus, type 2, in pregnancy, unspecified trimester: Secondary | ICD-10-CM

## 2020-04-22 NOTE — Progress Notes (Signed)
Induction Assessment Scheduling Form: Fax to [facility]:  [phone] Route to [facility] Labor Delivery   Dayonna Selbe                                                                                   DOB:  Apr 01, 1976                                                            MRN:  [id number]  Phone:  Home Phone [phone]  Mobile [phone]    Provider:  [facility] (Faculty Practice)  Admission Date/Time:  05/07/20 to 05/09/20 GP:  [id number]     Gestational age on admission:  39 weeks                                            Estimated Date of Delivery: 05/14/20  Dating Criteria: [place] at 12 weeks  Filed Weights   04/22/20 1332  Weight: 288 lb (130.6 kg)    GBS:  Pending HIV:  Non Reactive (03/01 1603)  Medical Indications for induction:  Type 2 DM in pregnancy Scheduling Provider Signature:  Sharen Counter, CNM         Method of induction(proposed):  AROM or Pitocin   Scheduling Provider Signature:  Sharen Counter, CNM                                            Today's Date:  04/22/2020

## 2020-04-22 NOTE — Progress Notes (Signed)
   PRENATAL VISIT NOTE  Subjective:  Shelly Lane is a 44 y.o. [id number] at [redacted]w[redacted]d being seen today for ongoing prenatal care.  She is currently monitored for the following issues for this high-risk pregnancy and has OBESITY; ANXIETY; BULIMIA; Depression affecting pregnancy, antepartum; Advanced maternal age in multigravida; SAB (spontaneous abortion) x 4; Grand multipara; Hx of preterm delivery x 2, currently pregnant; Hx gestational diabetes x 3; Multiple drug allergies; History of abnormal cervical Pap smear; DM (diabetes mellitus), type 2 (HCC); Sciatic nerve pain, right; Pregnancy; History of postpartum depression; Chronic pain; Supervision of high risk pregnancy, antepartum; Alpha thalassemia silent carrier; and Diabetes mellitus during pregnancy, antepartum on their problem list.  Patient reports occasional contractions.  Contractions: Irregular. Vag. Bleeding: None.  Movement: Present. Denies leaking of fluid.   The following portions of the patient's history were reviewed and updated as appropriate: allergies, current medications, past family history, past medical history, past social history, past surgical history and problem list.   Objective:   Vitals:   04/22/20 1332  BP: 119/80  Pulse: (!) 118  Weight: 288 lb (130.6 kg)    Fetal Status: Fetal Heart Rate (bpm): 150 Fundal Height: 37 cm Movement: Present  Presentation: Vertex  General:  Alert, oriented and cooperative. Patient is in no acute distress.  Skin: Skin is warm and dry. No rash noted.   Cardiovascular: Normal heart rate noted  Respiratory: Normal respiratory effort, no problems with respiration noted  Abdomen: Soft, gravid, appropriate for gestational age.  Pain/Pressure: Present     Pelvic: Cervical exam performed in the presence of a chaperone Dilation: 3 Effacement (%): 50 Station: -3  Extremities: Normal range of motion.     Mental Status: Normal mood and affect. Normal behavior. Normal judgment and thought  content.   Assessment and Plan:  Pregnancy: [id number] at [redacted]w[redacted]d 1. Pre-existing type 2 diabetes mellitus during pregnancy, antepartum --Fasting and PP glucose wnl with diet currently  2. Supervision of high risk pregnancy, antepartum --Anticipatory guidance about next visits/weeks of pregnancy given. --Next visit in 1 week in office --IOL at 39 weeks. Pt desires waterbirth. Has class certificate.  Consent signed.  Favorable cervix so may AROM for IOL or Cytotec vs short course of Pitocin so pt can have waterbirth. --Largest baby 8lb15 oz, this baby measures smaller but 85%tile  - Strep Gp B NAA - Cervicovaginal ancillary only( [place])  3. Sciatica of right side --Pain with lifting right leg, doing well overall, improved this week.   Term labor symptoms and general obstetric precautions including but not limited to vaginal bleeding, contractions, leaking of fluid and fetal movement were reviewed in detail with the patient. Please refer to After Visit Summary for other counseling recommendations.   Return in about 1 week (around 04/29/2020).  Future Appointments  Date Time Provider Department Center  04/22/2020  3:45 PM [facility] NURSE [facility]  04/22/2020  3:45 PM [facility] US4 [facility]  04/29/2020  3:45 PM [facility] NURSE [facility]  04/29/2020  3:45 PM [facility] US4 [facility]  04/30/2020  8:55 AM [place], Wilmer Floor, CNM [facility] None    Sharen Counter, CNM

## 2020-04-22 NOTE — Progress Notes (Signed)
   PRENATAL VISIT NOTE  Subjective:  Shelly Lane is a 44 y.o. [id number] at [redacted]w[redacted]d being seen today for ongoing prenatal care.  She is currently monitored for the following issues for this high-risk pregnancy and has OBESITY; ANXIETY; BULIMIA; Depression affecting pregnancy, antepartum; Advanced maternal age in multigravida; SAB (spontaneous abortion) x 4; Grand multipara; Hx of preterm delivery x 2, currently pregnant; Hx gestational diabetes x 3; Multiple drug allergies; History of abnormal cervical Pap smear; DM (diabetes mellitus), type 2 (HCC); Sciatic nerve pain, right; Pregnancy; History of postpartum depression; Chronic pain; Supervision of high risk pregnancy, antepartum; Alpha thalassemia silent carrier; and Diabetes mellitus during pregnancy, antepartum on their problem list.  Patient reports backache, no bleeding, no leaking and occasional contractions.  Contractions: Irregular. Vag. Bleeding: None.  Movement: Present. Denies leaking of fluid.   The following portions of the patient's history were reviewed and updated as appropriate: allergies, current medications, past family history, past medical history, past social history, past surgical history and problem list.   Objective:   Vitals:   04/22/20 1332  BP: 119/80  Pulse: (!) 118  Weight: 288 lb (130.6 kg)    Fetal Status: Fetal Heart Rate (bpm): 150   Movement: Present     General:  Alert, oriented and cooperative. Patient is in no acute distress.  Skin: Skin is warm and dry. No rash noted.   Cardiovascular: Normal heart rate noted  Respiratory: Normal respiratory effort, no problems with respiration noted  Abdomen: Soft, gravid, appropriate for gestational age.  Pain/Pressure: Present     Pelvic: Cervical exam performed in the presence of a chaperone        Extremities: Normal range of motion.     Mental Status: Normal mood and affect. Normal behavior. Normal judgment and thought content.   Assessment and Plan:    Pregnancy: [id number] at [redacted]w[redacted]d  1. Pre-existing type 2 diabetes mellitus during pregnancy, antepartum  2. Supervision of high risk pregnancy, antepartum  3. Sciatica of right side   Term labor symptoms and general obstetric precautions including but not limited to vaginal bleeding, contractions, leaking of fluid and fetal movement were reviewed in detail with the patient. Please refer to After Visit Summary for other counseling recommendations.     No follow-ups on file.  Future Appointments  Date Time Provider Department Center  04/22/2020  3:45 PM [facility] NURSE [facility]  04/22/2020  3:45 PM [facility] US4 [facility]  04/29/2020  3:45 PM [facility] NURSE [facility]  04/29/2020  3:45 PM [facility] US4 [facility]    Rojean Ige Alan Ripper, Medical Student

## 2020-04-23 LAB — CERVICOVAGINAL ANCILLARY ONLY
Chlamydia: NEGATIVE
Comment: NEGATIVE
Comment: NORMAL
Neisseria Gonorrhea: NEGATIVE

## 2020-04-24 LAB — STREP GP B NAA: Strep Gp B NAA: NEGATIVE

## 2020-04-29 ENCOUNTER — Ambulatory Visit (HOSPITAL_BASED_OUTPATIENT_CLINIC_OR_DEPARTMENT_OTHER): Payer: Medicaid Other

## 2020-04-29 ENCOUNTER — Other Ambulatory Visit: Payer: Self-pay

## 2020-04-29 ENCOUNTER — Encounter: Payer: Self-pay | Admitting: *Deleted

## 2020-04-29 ENCOUNTER — Ambulatory Visit: Payer: Medicaid Other | Admitting: *Deleted

## 2020-04-29 DIAGNOSIS — O24113 Pre-existing diabetes mellitus, type 2, in pregnancy, third trimester: Secondary | ICD-10-CM

## 2020-04-29 DIAGNOSIS — Z3A37 37 weeks gestation of pregnancy: Secondary | ICD-10-CM

## 2020-04-29 DIAGNOSIS — O0990 Supervision of high risk pregnancy, unspecified, unspecified trimester: Secondary | ICD-10-CM

## 2020-04-30 ENCOUNTER — Ambulatory Visit (INDEPENDENT_AMBULATORY_CARE_PROVIDER_SITE_OTHER): Payer: Medicaid Other | Admitting: Advanced Practice Midwife

## 2020-04-30 ENCOUNTER — Inpatient Hospital Stay (HOSPITAL_COMMUNITY)
Admission: AD | Admit: 2020-04-30 | Discharge: 2020-05-02 | DRG: 807 | Disposition: A | Discharge status: Home / Self Care | Payer: Medicaid Other | Attending: Family Medicine | Admitting: Family Medicine

## 2020-04-30 ENCOUNTER — Encounter (HOSPITAL_COMMUNITY): Payer: Self-pay | Admitting: Obstetrics and Gynecology

## 2020-04-30 ENCOUNTER — Other Ambulatory Visit: Payer: Self-pay

## 2020-04-30 ENCOUNTER — Telehealth (HOSPITAL_COMMUNITY): Payer: Self-pay | Admitting: *Deleted

## 2020-04-30 VITALS — BP 120/84 | HR 120 | Wt 292.0 lb

## 2020-04-30 DIAGNOSIS — O24119 Pre-existing diabetes mellitus, type 2, in pregnancy, unspecified trimester: Secondary | ICD-10-CM

## 2020-04-30 DIAGNOSIS — O99214 Obesity complicating childbirth: Secondary | ICD-10-CM | POA: Diagnosis present

## 2020-04-30 DIAGNOSIS — O139 Gestational [pregnancy-induced] hypertension without significant proteinuria, unspecified trimester: Secondary | ICD-10-CM | POA: Diagnosis present

## 2020-04-30 DIAGNOSIS — F32A Depression, unspecified: Secondary | ICD-10-CM | POA: Diagnosis present

## 2020-04-30 DIAGNOSIS — O26893 Other specified pregnancy related conditions, third trimester: Secondary | ICD-10-CM | POA: Diagnosis present

## 2020-04-30 DIAGNOSIS — Z3A38 38 weeks gestation of pregnancy: Secondary | ICD-10-CM | POA: Diagnosis not present

## 2020-04-30 DIAGNOSIS — G8929 Other chronic pain: Secondary | ICD-10-CM | POA: Diagnosis present

## 2020-04-30 DIAGNOSIS — O0990 Supervision of high risk pregnancy, unspecified, unspecified trimester: Secondary | ICD-10-CM

## 2020-04-30 DIAGNOSIS — Z79891 Long term (current) use of opiate analgesic: Secondary | ICD-10-CM | POA: Diagnosis not present

## 2020-04-30 DIAGNOSIS — D563 Thalassemia minor: Secondary | ICD-10-CM | POA: Diagnosis present

## 2020-04-30 DIAGNOSIS — R102 Pelvic and perineal pain: Secondary | ICD-10-CM

## 2020-04-30 DIAGNOSIS — E669 Obesity, unspecified: Secondary | ICD-10-CM | POA: Diagnosis present

## 2020-04-30 DIAGNOSIS — O134 Gestational [pregnancy-induced] hypertension without significant proteinuria, complicating childbirth: Secondary | ICD-10-CM | POA: Diagnosis present

## 2020-04-30 DIAGNOSIS — O24420 Gestational diabetes mellitus in childbirth, diet controlled: Secondary | ICD-10-CM | POA: Diagnosis present

## 2020-04-30 DIAGNOSIS — Z20822 Contact with and (suspected) exposure to covid-19: Secondary | ICD-10-CM | POA: Diagnosis present

## 2020-04-30 DIAGNOSIS — O09523 Supervision of elderly multigravida, third trimester: Secondary | ICD-10-CM

## 2020-04-30 DIAGNOSIS — O99340 Other mental disorders complicating pregnancy, unspecified trimester: Secondary | ICD-10-CM | POA: Diagnosis present

## 2020-04-30 DIAGNOSIS — Z8659 Personal history of other mental and behavioral disorders: Secondary | ICD-10-CM

## 2020-04-30 DIAGNOSIS — O24113 Pre-existing diabetes mellitus, type 2, in pregnancy, third trimester: Secondary | ICD-10-CM

## 2020-04-30 DIAGNOSIS — O09529 Supervision of elderly multigravida, unspecified trimester: Secondary | ICD-10-CM

## 2020-04-30 DIAGNOSIS — M5431 Sciatica, right side: Secondary | ICD-10-CM

## 2020-04-30 DIAGNOSIS — O0993 Supervision of high risk pregnancy, unspecified, third trimester: Secondary | ICD-10-CM

## 2020-04-30 DIAGNOSIS — F329 Major depressive disorder, single episode, unspecified: Secondary | ICD-10-CM | POA: Diagnosis present

## 2020-04-30 DIAGNOSIS — Z641 Problems related to multiparity: Secondary | ICD-10-CM

## 2020-04-30 HISTORY — DX: Gestational (pregnancy-induced) hypertension without significant proteinuria, unspecified trimester: O13.9

## 2020-04-30 LAB — COMPREHENSIVE METABOLIC PANEL
ALT: 15 U/L (ref 0–44)
AST: 24 U/L (ref 15–41)
Albumin: 2.6 g/dL — ABNORMAL LOW (ref 3.5–5.0)
Alkaline Phosphatase: 115 U/L (ref 38–126)
Anion gap: 13 (ref 5–15)
BUN: 6 mg/dL (ref 6–20)
CO2: 16 mmol/L — ABNORMAL LOW (ref 22–32)
Calcium: 9.4 mg/dL (ref 8.9–10.3)
Chloride: 105 mmol/L (ref 98–111)
Creatinine, Ser: 0.54 mg/dL (ref 0.44–1.00)
GFR calc Af Amer: >60 mL/min (ref >60)
GFR calc non Af Amer: >60 mL/min (ref >60)
Glucose, Bld: 178 mg/dL — ABNORMAL HIGH (ref 70–99)
Potassium: 4.2 mmol/L (ref 3.5–5.1)
Sodium: 134 mmol/L — ABNORMAL LOW (ref 135–145)
Total Bilirubin: 0.7 mg/dL (ref 0.3–1.2)
Total Protein: 6.1 g/dL — ABNORMAL LOW (ref 6.5–8.1)

## 2020-04-30 LAB — CBC
HCT: 38.8 % (ref 36.0–46.0)
Hemoglobin: 12.6 g/dL (ref 12.0–15.0)
MCH: 29.2 pg (ref 26.0–34.0)
MCHC: 32.5 g/dL (ref 30.0–36.0)
MCV: 90 fL (ref 80.0–100.0)
Platelets: 203 10*3/uL (ref 150–400)
RBC: 4.31 MIL/uL (ref 3.87–5.11)
RDW: 14.9 % (ref 11.5–15.5)
WBC: 10.5 10*3/uL (ref 4.0–10.5)
nRBC: 0 % (ref 0.0–0.2)

## 2020-04-30 LAB — URINALYSIS, ROUTINE W REFLEX MICROSCOPIC
Bilirubin Urine: NEGATIVE
Glucose, UA: 50 mg/dL — AB
Hgb urine dipstick: NEGATIVE
Ketones, ur: NEGATIVE mg/dL
Leukocytes,Ua: NEGATIVE
Nitrite: NEGATIVE
Protein, ur: NEGATIVE mg/dL
Specific Gravity, Urine: 1.004 — ABNORMAL LOW (ref 1.005–1.030)
pH: 6 (ref 5.0–8.0)

## 2020-04-30 LAB — SARS CORONAVIRUS 2 BY RT PCR (HOSPITAL ORDER, PERFORMED IN ~~LOC~~ HOSPITAL LAB): SARS Coronavirus 2: NEGATIVE

## 2020-04-30 LAB — TYPE AND SCREEN
ABO/RH(D): O POS
Antibody Screen: NEGATIVE

## 2020-04-30 LAB — GLUCOSE, CAPILLARY
Glucose-Capillary: 160 mg/dL — ABNORMAL HIGH (ref 70–99)
Glucose-Capillary: 117 mg/dL — ABNORMAL HIGH (ref 70–99)
Glucose-Capillary: 111 mg/dL — ABNORMAL HIGH (ref 70–99)

## 2020-04-30 LAB — PROTEIN / CREATININE RATIO, URINE
Creatinine, Urine: 33.38 mg/dL
Protein Creatinine Ratio: 0.21 mg/mg{Cre} — ABNORMAL HIGH (ref 0.00–0.15)
Total Protein, Urine: 7 mg/dL

## 2020-04-30 MED ORDER — LACTATED RINGERS IV SOLN
500.0000 mL | INTRAVENOUS | Status: DC | PRN
  Administered 2020-04-30 – 2020-05-01 (×2): 500 mL via INTRAVENOUS

## 2020-04-30 MED ORDER — SOD CITRATE-CITRIC ACID 500-334 MG/5ML PO SOLN: 30.0000 mL | ORAL | Status: DC | PRN

## 2020-04-30 MED ORDER — ONDANSETRON HCL 4 MG/2ML IJ SOLN: 4.0000 mg | Freq: Four times a day (QID) | INTRAMUSCULAR | Status: DC | PRN

## 2020-04-30 MED ORDER — LIDOCAINE HCL (PF) 1 % IJ SOLN: 30.0000 mL | INTRAMUSCULAR | Status: DC | PRN

## 2020-04-30 MED ORDER — OXYCODONE HCL 5 MG PO TABS
15.0000 mg | ORAL_TABLET | Freq: Three times a day (TID) | ORAL | Status: DC | PRN
  Administered 2020-04-30 – 2020-05-02 (×5): 15 mg via ORAL
  Filled 2020-04-30 (×6): qty 3

## 2020-04-30 MED ORDER — LACTATED RINGERS IV SOLN: INTRAVENOUS | Status: DC

## 2020-04-30 MED ORDER — OXYTOCIN BOLUS FROM INFUSION
500.0000 mL | Freq: Once | INTRAVENOUS | Status: AC
  Administered 2020-05-01: 500 mL via INTRAVENOUS

## 2020-04-30 MED ORDER — TERBUTALINE SULFATE 1 MG/ML IJ SOLN: 0.2500 mg | Freq: Once | INTRAMUSCULAR | Status: DC | PRN

## 2020-04-30 MED ORDER — OXYTOCIN 40 UNITS IN NORMAL SALINE INFUSION - SIMPLE MED
1.0000 m[IU]/min | INTRAVENOUS | Status: DC
  Administered 2020-04-30: 2 m[IU]/min via INTRAVENOUS
  Filled 2020-04-30: qty 1000

## 2020-04-30 MED ORDER — TRANEXAMIC ACID-NACL 1000-0.7 MG/100ML-% IV SOLN
1000.0000 mg | Freq: Once | INTRAVENOUS | Status: AC
  Administered 2020-05-01: 1000 mg via INTRAVENOUS
  Filled 2020-04-30: qty 100

## 2020-04-30 MED ORDER — OXYTOCIN 40 UNITS IN NORMAL SALINE INFUSION - SIMPLE MED: 2.5000 [IU]/h | INTRAVENOUS | Status: DC

## 2020-04-30 NOTE — MAU Note (Signed)
.   Shelly Lane is a 44 y.o. at [redacted]w[redacted]d here in MAU reporting: contractions since last night. Denies any VB or LOF  Onset of complaint: last night Pain score: 6 Vitals:   04/30/20 1712 04/30/20 1713  BP:  (!) 142/77  Pulse: (!) 127   Resp: 18   Temp: 97.9 F (36.6 C)      FHT:150 Lab orders placed from triage:

## 2020-04-30 NOTE — Telephone Encounter (Signed)
Preadmission screen  

## 2020-04-30 NOTE — Progress Notes (Signed)
ROB  CC: per pt she has numerous complaints mainly pain and wants her cervix checked.

## 2020-04-30 NOTE — Progress Notes (Signed)
   PRENATAL VISIT NOTE  Subjective:  Shelly Lane is a 44 y.o. [id number] at [redacted]w[redacted]d being seen today for ongoing prenatal care.  She is currently monitored for the following issues for this high-risk pregnancy and has OBESITY; ANXIETY; BULIMIA; Depression affecting pregnancy, antepartum; Advanced maternal age in multigravida; SAB (spontaneous abortion) x 4; Grand multipara; Hx of preterm delivery x 2, currently pregnant; Hx gestational diabetes x 3; Multiple drug allergies; History of abnormal cervical Pap smear; DM (diabetes mellitus), type 2 (HCC); Sciatic nerve pain, right; Pregnancy; History of postpartum depression; Chronic pain; Supervision of high risk pregnancy, antepartum; Alpha thalassemia silent carrier; and Diabetes mellitus during pregnancy, antepartum on their problem list.  Patient reports pelvic pain and back pain making walking very painful.  Contractions: Not present. Vag. Bleeding: None.  Movement: Present. Denies leaking of fluid.   The following portions of the patient's history were reviewed and updated as appropriate: allergies, current medications, past family history, past medical history, past social history, past surgical history and problem list.   Objective:   Vitals:   04/30/20 0901  BP: 120/84  Pulse: (!) 120  Weight: 292 lb (132.5 kg)    Fetal Status: Fetal Heart Rate (bpm): 140 Fundal Height: 39 cm Movement: Present  Presentation: Vertex  General:  Alert, oriented and cooperative. Patient is in no acute distress.  Skin: Skin is warm and dry. No rash noted.   Cardiovascular: Normal heart rate noted  Respiratory: Normal respiratory effort, no problems with respiration noted  Abdomen: Soft, gravid, appropriate for gestational age.  Pain/Pressure: Present     Pelvic: Cervical exam performed in the presence of a chaperone Dilation: 5 Effacement (%): 50 Station: -2  Extremities: Normal range of motion.  Edema: Trace  Mental Status: Normal mood and affect.  Normal behavior. Normal judgment and thought content.   Assessment and Plan:  Pregnancy: [id number] at [redacted]w[redacted]d 1. Pre-existing type 2 diabetes mellitus during pregnancy, antepartum --Reviewed glucose log and pt reports fastings all below 95 and PP mostly below 120 with 1 elevated out of 10+ values.   2. Supervision of high risk pregnancy, antepartum --Anticipatory guidance about next visits/weeks of pregnancy given. --Reviewed labor readiness, including [employer] with pt.  Cervix 5 cm today but 50% effaced. Labor precautions reviewed.  --Next visit in 1 week in person --IOL scheduled 05/08/20  3. Sciatica of right side --Pain is worsening, pt wearing support belt.  Exercises given.   4. Pelvic pain affecting pregnancy in third trimester, antepartum --Pain mostly with walking, is extremely painful. Worse with this pregnancy than ever before.   5. Multigravida of advanced maternal age in third trimester  Term labor symptoms and general obstetric precautions including but not limited to vaginal bleeding, contractions, leaking of fluid and fetal movement were reviewed in detail with the patient. Please refer to After Visit Summary for other counseling recommendations.   Return in about 2 days (around 05/02/2020).  Future Appointments  Date Time Provider Department Center  05/02/2020 11:00 AM [facility] NURSE [facility] None  05/07/2020 10:30 AM [facility]EN[facility] None  05/07/2020  4:15 PM Malachy Chamber, MD [facility] None  05/09/2020  8:55 AM [facility] SCHED ROOM [facility] None    Sharen Counter, CNM

## 2020-04-30 NOTE — Progress Notes (Signed)
Labor Progress Note Shelly Lane is a 44 y.o. [id number] at 11w0dpresented for SOL.  S: Feeling ctx but does not feel they are getting stronger. Met patient and discussed plan. Discussed that team decided waterbirth was not an option and patient understanding.   O:  BP 122/85   Pulse (!) 112   Temp 98.2 F (36.8 C) (Oral)   Resp 16   Ht 5' 6.25" (1.683 m)   Wt 132.5 kg   BMI 46.78 kg/m  EFM: 150, minimal to moderate variability, pos accels but few, no decels, reactive TOCO: unable to trace  CVE: Dilation: 6 Effacement (%): 70 Station: Ballotable Presentation: Vertex Exam by:: Dr. FMarice Potter  A&P: 44y.o. [id number] for SOL. #Labor: Cervix unchanged. Evaluated for AROM but ballotable and occasionally with a hand in front of the head. Will give low dose Pit with goal of turning off once at an AROM point and can then do intermittent monitoring. TXA ordered prior to delivery. Anticipate SVD. #Pain: per patient request #FWB: Cat I #GBS negative #GDMA1: has never had elevated A1c, currently not on meds but was on insulin for a short time earlier in pregnancy, last glucose 117 #gHTN vs Pre-E: Asymptomatic but Pr/Cr pending, CMP WNL  ChChauncey MannMD 8:43 PM

## 2020-04-30 NOTE — Patient Instructions (Signed)
Things to Try After 37 weeks to Encourage Labor/Get Ready for Labor:   1.  Try the Miles Circuit at www.milescircuit.com daily to improve baby's position and encourage the onset of labor.  2. Walk a little and rest a little every day.  Change positions often.  3. Cervical Ripening: May try one or both a. Red Raspberry Leaf capsules or tea:  two 300mg or 400mg tablets with each meal, 2-3 times a day, or 1-3 cups of tea daily  Potential Side Effects Of Raspberry Leaf:  Most women do not experience any side effects from drinking raspberry leaf tea. However, nausea and loose stools are possible   b. Evening Primrose Oil capsules: may take 1 to 3 capsules daily. May also prick one to release the oil and insert it into your vagina at night.  Some of the potential side effects:  Upset stomach  Loose stools or diarrhea  Headaches  Nausea  4. Sex (and especially sex with orgasm) can also help the cervix ripen and encourage labor onset.    Labor Precautions Reasons to come to MAU at [facility]:  1.  Contractions are  5 minutes apart or less, each last 1 minute, these have been going on for 1-2 hours, and you cannot walk or talk during them 2.  You have a large gush of fluid, or a trickle of fluid that will not stop and you have to wear a pad 3.  You have bleeding that is bright red, heavier than spotting--like menstrual bleeding (spotting can be normal in early labor or after a check of your cervix) 4.  You do not feel the baby moving like he/she normally does 

## 2020-04-30 NOTE — H&P (Addendum)
OBSTETRIC ADMISSION HISTORY AND PHYSICAL  Shelly Lane is a 45 y.o. female [id number] with IUP at [place]. She reports +FMs, No LOF, no VB, no blurry vision, headaches or peripheral edema, and RUQ pain.  She plans on breast feeding. She requests IUD for birth control. She [facility] [place]  Estimated Date of Delivery: 05/14/20  Sono:    _0 , CWD, normal anatomy, cephalic presentation, anterior placenta, (36w 6d) 3126g, 83% EFW  Prenatal History/Complications: - GDM, hx of insulin use during this pregnancy but diet controlled since 33wks  - alpha thal carrier  - grand multipara  - AMA - morbid obesity   Past Medical History: Past Medical History:  Diagnosis Date  . Abnormal Pap smear    Colpo;Last pap 2012, ASCUS w/ negative HPV  . Anemia    during preg  . Anxiety   . Eczema   . GERD (gastroesophageal reflux disease)   . Gestational diabetes    diet controlled  . H/O migraine   . H/O rubella   . H/O varicella   . H/O: depression   . H/O: obesity   . Hx: UTI (urinary tract infection)   . Opioid use   . Preterm labor   . Vaginal Pap smear, abnormal     Past Surgical History: Past Surgical History:  Procedure Laterality Date  . CHOLECYSTECTOMY  2008  . HERNIA REPAIR  2012  . HIATAL HERNIA REPAIR  2012  . WISDOM TOOTH EXTRACTION      Obstetrical History: OB History    Gravida  13   Para  10   Term  8   Preterm  2   AB  2   Living  10     SAB  2   TAB      Ectopic      Multiple  0   Live Births  10           Social History: Social History   Socioeconomic History  . Marital status: Married    Spouse name: Shelly Lane  . Number of children: 9  . Years of education: 15 . Highest education level: Not on file  Occupational History  . Occupation: SAHM  Tobacco Use  . Smoking status: Never Smoker  . Smokeless tobacco: Never Used  Substance and Sexual Activity  . Alcohol  use: No  . Drug use: Yes    Types: Oxycodone    Comment: per pt prescribed by physician for chronic back pain  . Sexual activity: Yes    Partners: Male  Other Topics Concern  . Not on file  Social History Narrative  . Not on file   Social Determinants of Health   Financial Resource Strain:   . Difficulty of Paying Living Expenses:   Food Insecurity:   . Worried About RCharity fundraiserin the Last Year:   . RArboriculturistin the Last Year:   Transportation Needs:   . LFilm/video editor(Medical):   .Marland KitchenLack of Transportation (Non-Medical):   Physical Activity:   . Days of Exercise per Week:   . Minutes of Exercise per Session:   Stress:   . Feeling of Stress :   Social Connections:   . Frequency of Communication with Friends and Family:   . Frequency of Social Gatherings with Friends and Family:   . Attends Religious Services:   . Active Member  of Clubs or Organizations:   . Attends Archivist Meetings:   Marland Kitchen Marital Status:     Family History: Family History  Problem Relation Age of Onset  . Diabetes Maternal Grandmother   . Hypertension Maternal Grandmother   . Anemia Mother     Allergies: Allergies  Allergen Reactions  . Hydroxyzine Hcl Other (See Comments)    "Made me feel like I was going nuts".   . Subutex [Buprenorphine] Itching  . Vicodin [Hydrocodone-Acetaminophen] Itching    Facility-Administered Medications Prior to Admission  Medication Dose Route Frequency Provider Last Rate Last Admin  . HYDROXYprogesterone Caproate SOAJ 275 mg  275 mg Subcutaneous Once Sloan Leiter, MD      . HYDROXYprogesterone Caproate SOAJ 275 mg  275 mg Subcutaneous Once Lavonia Drafts, MD       Medications Prior to Admission  Medication Sig Dispense Refill Last Dose  . Accu-Chek FastClix Lancets MISC 1 Device by Percutaneous route 4 (four) times daily. 100 each 12 04/30/2020 at Unknown time  . Blood Pressure Monitoring (BLOOD PRESSURE KIT) DEVI 1  kit by Does not apply route once a week. Check Blood Pressure regularly and record readings into the [employer] App.  Large Cuff.  DX O90.0 1 each 0 04/30/2020 at Unknown time  . cyclobenzaprine (FLEXERIL) 10 MG tablet Take 10 mg by mouth 3 (three) times daily as needed for muscle spasms.   04/30/2020 at Unknown time  . ferrous sulfate 325 (65 FE) MG EC tablet Take 325 mg by mouth 3 (three) times daily with meals.   04/30/2020 at Unknown time  . gabapentin (NEURONTIN) 300 MG capsule Take 1 capsule (300 mg total) by mouth 3 (three) times daily. 90 capsule 3 04/30/2020 at Unknown time  . glucose blood (ACCU-CHEK GUIDE) test strip Use as instructed 100 each 12 04/30/2020 at Unknown time  . oxyCODONE (ROXICODONE) 15 MG immediate release tablet Take 10 mg by mouth every 4 (four) hours as needed for pain.   04/30/2020 at Unknown time  . Prenatal-Fe Fum-Methf-FA w/o A (VITAFOL-NANO) 18-0.6-0.4 MG TABS Take 1 tablet by mouth daily. 30 tablet 12 04/30/2020 at Unknown time  . zolpidem (AMBIEN) 5 MG tablet Take 1 tablet (5 mg total) by mouth at bedtime as needed for sleep. 30 tablet 1 Past Week at Unknown time  . insulin NPH Human (NOVOLIN N) 100 UNIT/ML injection Inject 0.2 mLs (20 Units total) into the skin 2 (two) times daily before a meal. At breakfast and supper 10 mL 3   . Insulin Pen Needle 31G X 6 MM MISC 1 Syringe by Does not apply route at bedtime. 100 each 2   . Insulin Syringe-Needle U-100 27G X 1/2" 0.5 ML MISC Use at bedtime for insulin injection 100 each 3   . OXYCODONE ER PO Take by mouth.     . zolpidem (AMBIEN) 5 MG tablet Take 5 mg by mouth at bedtime as needed for sleep.        Review of Systems   All systems reviewed and negative except as stated in HPI  Blood pressure 112/75, pulse (!) 121, temperature 98.2 F (36.8 C), temperature source Oral, resp. rate 16, height 5' 6.25" (1.683 m), weight 132.5 kg, currently breastfeeding. General appearance: alert, cooperative, appears stated age and  no distress Lungs: normal effort Heart: regular rate  Abdomen: soft, non-tender; bowel sounds normal Pelvic: gravid uterus Extremities: Homans sign is negative, no sign of DVT Presentation: cephalic Fetal monitoringBaseline: 155 bpm, Variability: Good {>  6 bpm), Accelerations: Reactive and Decelerations: Absent Uterine activity: Frequency: Every 2-3 minutes Dilation: 6 Effacement (%): 70, 80 Station: Ballotable Exam by:: Ginger Morris, RN  Prenatal labs: ABO, Rh: --/--/PENDING (05/25 1835) Antibody: PENDING (05/25 1835) Rubella: 2.74 (11/30 1533) RPR: Non Reactive (03/01 1603)  HBsAg: Negative (11/30 1533)  HIV: Non Reactive (03/01 1603)  GBS: Negative/-- (05/17 1504)  2 hr Glucola : early 5.6,  Genetic screening  Low risk NIPS, AFP was negative  Anatomy US: 12/29/19 scan was normal, follow up scans WNL,  Fetal echo normal  Prenatal Transfer Tool  Maternal Diabetes: Yes:  Diabetes Type:  Insulin/Medication controlled, Diet controlled Genetic Screening: Normal Maternal Ultrasounds/Referrals: Normal Fetal Ultrasounds or other Referrals:  Fetal echo, normal Maternal Substance Abuse:  No Significant Maternal Medications:  Meds include: Other: insulin  Significant Maternal Lab Results: Group B Strep negative  Results for orders placed or performed during the hospital encounter of 04/30/20 (from the past 24 hour(s))  SARS Coronavirus 2 by RT PCR (hospital order, performed in  hospital lab) Nasopharyngeal Nasopharyngeal Swab   Collection Time: 04/30/20  6:09 PM   Specimen: Nasopharyngeal Swab  Result Value Ref Range   SARS Coronavirus 2 NEGATIVE NEGATIVE  Comprehensive metabolic panel   Collection Time: 04/30/20  6:09 PM  Result Value Ref Range   Sodium 134 (L) 135 - 145 mmol/L   Potassium 4.2 3.5 - 5.1 mmol/L   Chloride 105 98 - 111 mmol/L   CO2 16 (L) 22 - 32 mmol/L   Glucose, Bld 178 (H) 70 - 99 mg/dL   BUN 6 6 - 20 mg/dL   Creatinine, Ser 0.54 0.44 - 1.00  mg/dL   Calcium 9.4 8.9 - 10.3 mg/dL   Total Protein 6.1 (L) 6.5 - 8.1 g/dL   Albumin 2.6 (L) 3.5 - 5.0 g/dL   AST 24 15 - 41 U/L   ALT 15 0 - 44 U/L   Alkaline Phosphatase 115 38 - 126 U/L   Total Bilirubin 0.7 0.3 - 1.2 mg/dL   GFR calc non Af Amer >60 >60 mL/min   GFR calc Af Amer >60 >60 mL/min   Anion gap 13 5 - 15  Type and screen [facility]   Collection Time: 04/30/20  6:35 PM  Result Value Ref Range   ABO/RH(D) PENDING    Antibody Screen PENDING    Sample Expiration      05/03/2020,2359 Performed at [facility], [address] [place], [place] [address]   Glucose, capillary   Collection Time: 04/30/20  6:45 PM  Result Value Ref Range   Glucose-Capillary 160 (H) 70 - 99 mg/dL  CBC   Collection Time: 04/30/20  6:48 PM  Result Value Ref Range   WBC 10.5 4.0 - 10.5 K/uL   RBC 4.31 3.87 - 5.11 MIL/uL   Hemoglobin 12.6 12.0 - 15.0 g/dL   HCT 38.8 36.0 - 46.0 %   MCV 90.0 80.0 - 100.0 fL   MCH 29.2 26.0 - 34.0 pg   MCHC 32.5 30.0 - 36.0 g/dL   RDW 14.9 11.5 - 15.5 %   Platelets 203 150 - 400 K/uL   nRBC 0.0 0.0 - 0.2 %    Patient Active Problem List   Diagnosis Date Noted  . Labor and delivery, indication for care 04/30/2020  . Diabetes mellitus during pregnancy, antepartum 12/15/2019  . Alpha thalassemia silent carrier 11/19/2019  . Supervision of high risk pregnancy, antepartum 11/07/2019  . Chronic   pain 11/06/2019  . History of postpartum depression 07/20/2018  . Pregnancy 07/18/2018  . Sciatic nerve pain, right 05/09/2018  . DM (diabetes mellitus), type 2 ([place]) 02/10/2018  . History of abnormal cervical Pap smear 10/04/2013  . Advanced maternal age in multigravida 02/13/2013  . SAB (spontaneous abortion) x 4 02/13/2013  . [place] multipara 02/13/2013  . Hx of preterm delivery x 2, currently pregnant 02/13/2013  . Hx gestational diabetes x 3 02/13/2013  . Multiple drug allergies 02/13/2013  . BULIMIA 10/21/2007  . ANXIETY  08/02/2007  . OBESITY 08/01/2007  . Depression affecting pregnancy, antepartum 08/01/2007    Assessment/Plan:  Torrance Frech is a 44 y.o. [id number] at 39w0dhere for  #Labor:expectant management, anticipate NSVD with waterbirth   #Pain: Oxycodone 118mq8 hours per home pain management regimen   #FWB: Category I strip   # EFW: 7lbs   #ID: GBS Negative  #MOF: breast feeding   #MOC: wants postpartum IUD   #Circ:  Yes   #GDMA1:  -patient previously on insulin management but none recently, monitor CBG every 4 hours -home BG ranges in low 90s-120s 2 hours post prandial, AM fastings range in the 70s  -insulin initiated at 32 weeks and then discontinued at 33 weeks due to BG goals within range FBS<90 and PP <120  #Elevated Blood pressures: f/u PIH labs including CMP, CBC, protein:Creatinine ratio for PIH evaluation  #Chronic Pain: patient on chronic opioids, oxycodone three times daily, followed by pain management, will continue home PO regimen for pain control as listed above   #Anxiety:declined [facility] referral and requests no medication during pregnancy or PP    MaStark KleinMD Family Medicine, PGY-1 04/30/2020, 7:37 PM  CNM attestation:  I have seen and examined this patient; I agree with above documentation in the resident's note.   LiNoella Kipniss a [age].o. [id number] for latent labor/advanced cervical change. Hx of GDMA1 currently well controlled, although she was prescribed insulin briefly around 32-33wks; AMA; grandmultip; obesity.  PE: BP 112/75 (BP Location: Left Arm)   Pulse (!) 121   Temp 98.2 F (36.8 C) (Oral)   Resp 16   Ht 5' 6.25" (1.683 m)   Wt 132.5 kg   BMI 46.78 kg/m  Gen: calm comfortable, NAD; breathing thru some ctx Resp: normal effort, no distress Abd: gravid  ROS, labs, PMH reviewed  Plan: -Admit to Labor and Delivery -Expectant management and will make plan based on pt's ctx and cervical change -She is desirous of a  waterbirth, but had an elevated BP in [facility] (142/77) and one yesterday (140/66), giving a dx of gHTN (pre-e labs pending) which would risk her out -[place]/25/2021, 8:15 PM

## 2020-05-01 ENCOUNTER — Inpatient Hospital Stay (HOSPITAL_COMMUNITY): Payer: Medicaid Other | Admitting: Anesthesiology

## 2020-05-01 ENCOUNTER — Encounter (HOSPITAL_COMMUNITY): Payer: Self-pay | Admitting: Family Medicine

## 2020-05-01 DIAGNOSIS — Z3A38 38 weeks gestation of pregnancy: Secondary | ICD-10-CM

## 2020-05-01 LAB — GLUCOSE, CAPILLARY
Glucose-Capillary: 121 mg/dL — ABNORMAL HIGH (ref 70–99)
Glucose-Capillary: 160 mg/dL — ABNORMAL HIGH (ref 70–99)
Glucose-Capillary: 160 mg/dL — ABNORMAL HIGH (ref 70–99)
Glucose-Capillary: 98 mg/dL (ref 70–99)

## 2020-05-01 LAB — CBC
HCT: 39.9 % (ref 36.0–46.0)
Hemoglobin: 13.1 g/dL (ref 12.0–15.0)
MCH: 29.1 pg (ref 26.0–34.0)
MCHC: 32.8 g/dL (ref 30.0–36.0)
MCV: 88.7 fL (ref 80.0–100.0)
Platelets: 200 10*3/uL (ref 150–400)
RBC: 4.5 MIL/uL (ref 3.87–5.11)
RDW: 14.6 % (ref 11.5–15.5)
WBC: 10.7 10*3/uL — ABNORMAL HIGH (ref 4.0–10.5)
nRBC: 0.3 % — ABNORMAL HIGH (ref 0.0–0.2)

## 2020-05-01 LAB — RPR: RPR Ser Ql: NONREACTIVE

## 2020-05-01 MED ORDER — ONDANSETRON HCL 4 MG PO TABS: 4.0000 mg | ORAL_TABLET | ORAL | Status: DC | PRN

## 2020-05-01 MED ORDER — SODIUM CHLORIDE (PF) 0.9 % IJ SOLN
INTRAMUSCULAR | Status: DC | PRN
  Administered 2020-05-01: 12 mL/h via EPIDURAL

## 2020-05-01 MED ORDER — LIDOCAINE HCL (PF) 1 % IJ SOLN: INTRAMUSCULAR | Status: DC | PRN

## 2020-05-01 MED ORDER — EPHEDRINE 5 MG/ML INJ: 10.0000 mg | INTRAVENOUS | Status: DC | PRN

## 2020-05-01 MED ORDER — PHENYLEPHRINE 40 MCG/ML (10ML) SYRINGE FOR IV PUSH (FOR BLOOD PRESSURE SUPPORT): 80.0000 ug | PREFILLED_SYRINGE | INTRAVENOUS | Status: DC | PRN

## 2020-05-01 MED ORDER — MEASLES, MUMPS & RUBELLA VAC IJ SOLR: 0.5000 mL | Freq: Once | INTRAMUSCULAR | Status: DC

## 2020-05-01 MED ORDER — DIPHENHYDRAMINE HCL 50 MG/ML IJ SOLN: 12.5000 mg | INTRAMUSCULAR | Status: DC | PRN

## 2020-05-01 MED ORDER — LACTATED RINGERS IV SOLN
500.0000 mL | Freq: Once | INTRAVENOUS | Status: AC
  Administered 2020-05-01: 500 mL via INTRAVENOUS

## 2020-05-01 MED ORDER — FENTANYL-BUPIVACAINE-NACL 0.5-0.125-0.9 MG/250ML-% EP SOLN: 12.0000 mL/h | EPIDURAL | Status: DC | PRN

## 2020-05-01 MED ORDER — IBUPROFEN 600 MG PO TABS
600.0000 mg | ORAL_TABLET | Freq: Three times a day (TID) | ORAL | Status: DC | PRN
  Administered 2020-05-01 – 2020-05-02 (×5): 600 mg via ORAL
  Filled 2020-05-01 (×5): qty 1

## 2020-05-01 MED ORDER — INSULIN ASPART 100 UNIT/ML ~~LOC~~ SOLN
2.0000 [IU] | Freq: Once | SUBCUTANEOUS | Status: AC
  Administered 2020-05-01: 2 [IU] via SUBCUTANEOUS

## 2020-05-01 MED ORDER — PRENATAL MULTIVITAMIN CH
1.0000 | ORAL_TABLET | Freq: Every day | ORAL | Status: DC
  Administered 2020-05-02: 1 via ORAL
  Filled 2020-05-01 (×2): qty 1

## 2020-05-01 MED ORDER — DIPHENHYDRAMINE HCL 25 MG PO CAPS: 25.0000 mg | ORAL_CAPSULE | Freq: Four times a day (QID) | ORAL | Status: DC | PRN

## 2020-05-01 MED ORDER — DIBUCAINE (PERIANAL) 1 % EX OINT
1.0000 "application " | TOPICAL_OINTMENT | CUTANEOUS | Status: DC | PRN
  Administered 2020-05-02: 1 via RECTAL
  Filled 2020-05-01: qty 28

## 2020-05-01 MED ORDER — SIMETHICONE 80 MG PO CHEW: 80.0000 mg | CHEWABLE_TABLET | ORAL | Status: DC | PRN

## 2020-05-01 MED ORDER — TETANUS-DIPHTH-ACELL PERTUSSIS 5-2.5-18.5 LF-MCG/0.5 IM SUSP: 0.5000 mL | Freq: Once | INTRAMUSCULAR | Status: DC

## 2020-05-01 MED ORDER — ONDANSETRON HCL 4 MG/2ML IJ SOLN: 4.0000 mg | INTRAMUSCULAR | Status: DC | PRN

## 2020-05-01 MED ORDER — SENNOSIDES-DOCUSATE SODIUM 8.6-50 MG PO TABS
2.0000 | ORAL_TABLET | ORAL | Status: DC
  Administered 2020-05-01: 2 via ORAL
  Filled 2020-05-01: qty 2

## 2020-05-01 MED ORDER — COCONUT OIL OIL: 1.0000 "application " | TOPICAL_OIL | Status: DC | PRN

## 2020-05-01 MED ORDER — BENZOCAINE-MENTHOL 20-0.5 % EX AERO
1.0000 "application " | INHALATION_SPRAY | CUTANEOUS | Status: DC | PRN
  Administered 2020-05-01: 1 via TOPICAL
  Filled 2020-05-01: qty 56

## 2020-05-01 MED ORDER — WITCH HAZEL-GLYCERIN EX PADS
1.0000 "application " | MEDICATED_PAD | CUTANEOUS | Status: DC | PRN
  Administered 2020-05-02: 1 via TOPICAL

## 2020-05-01 MED ORDER — FENTANYL-BUPIVACAINE-NACL 0.5-0.125-0.9 MG/250ML-% EP SOLN
12.0000 mL/h | EPIDURAL | Status: DC | PRN
  Filled 2020-05-01: qty 250

## 2020-05-01 MED ORDER — PHENYLEPHRINE 40 MCG/ML (10ML) SYRINGE FOR IV PUSH (FOR BLOOD PRESSURE SUPPORT)
80.0000 ug | PREFILLED_SYRINGE | INTRAVENOUS | Status: DC | PRN
  Filled 2020-05-01: qty 10

## 2020-05-01 MED ORDER — LIDOCAINE HCL (PF) 1 % IJ SOLN
INTRAMUSCULAR | Status: DC | PRN
  Administered 2020-05-01: 6 mL via EPIDURAL

## 2020-05-01 MED ORDER — ACETAMINOPHEN 325 MG PO TABS
650.0000 mg | ORAL_TABLET | Freq: Four times a day (QID) | ORAL | Status: DC | PRN
  Administered 2020-05-01 – 2020-05-02 (×5): 650 mg via ORAL
  Filled 2020-05-01 (×5): qty 2

## 2020-05-01 NOTE — Discharge Summary (Signed)
Postpartum Discharge Summary      Patient Name: Shelly Lane DOB: Aug 06, 1976 MRN: [id number]  Date of admission: 04/30/2020 Delivery date:05/01/2020  Delivering provider: Chauncey Mann  Date of discharge: 05/02/2020  Admitting diagnosis: Labor and delivery, indication for care [O75.9] Intrauterine pregnancy: [redacted]w[redacted]d    Secondary diagnosis:  Active Problems:   OBESITY   Depression affecting pregnancy, antepartum   Advanced maternal age in multigravida   [place]   History of postpartum depression   Chronic pain   Alpha thalassemia silent carrier   Labor and delivery, indication for care   Gestational hypertension  Additional problems: None    Discharge diagnosis: Term Pregnancy Delivered, Gestational Hypertension and GDM A2                                              Post partum procedures:None Augmentation: AROM and Pitocin Complications: None  Hospital course: Onset of Labor With Vaginal Delivery      44y.o. yo [id number] 325w1das admitted in Latent Labor on 04/30/2020. Patient had an uncomplicated labor course as follows: Initial SVE: 5/50/-2. Pitocin started for augmentation and then AROM performed. Received epidural. She then progressed to complete.  Membrane Rupture Time/Date: 3:13 AM ,05/01/2020   Delivery Method:Vaginal, Spontaneous  Episiotomy: None  Lacerations:  None  Patient had an uncomplicated postpartum course. Fasting AM glucoses remained elevated at 142 and 160, however they may not have been true fasting values. BP's monitored and found to be normotensive during her PP stay. She is ambulating, tolerating a regular diet, passing flatus, and urinating well. Patient is discharged home in stable condition on 05/02/20.  Newborn Data: Birth date:05/01/2020  Birth time:5:50 AM  Gender:Female  Living status:Living  Apgars:9 ,9  Weight:3730 g   Magnesium Sulfate received: No BMZ received: No Rhophylac:N/A MMR:N/A T-DaP: Declined Flu:  No Transfusion:No  Physical exam  Vitals:   05/01/20 0925 05/01/20 1325 05/01/20 1712 05/01/20 2305  BP: 108/64 125/63 (!) 113/54 (!) 103/56  Pulse: (!) 108 (!) 110 96 100  Resp: '16 16 18 18  '$ Temp: 98.3 F (36.8 C) 98.7 F (37.1 C) 98.2 F (36.8 C) 98.6 F (37 C)  TempSrc: Oral Oral Oral Oral  SpO2: 99% 99% 98%   Weight:      Height:       General: alert and cooperative Lochia: appropriate Uterine Fundus: firm Incision: N/A DVT Evaluation: No evidence of DVT seen on physical exam. Labs: Lab Results  Component Value Date   WBC 10.7 (H) 05/01/2020   HGB 13.1 05/01/2020   HCT 39.9 05/01/2020   MCV 88.7 05/01/2020   PLT 200 05/01/2020   CMP Latest Ref Rng & Units 04/30/2020  Glucose 70 - 99 mg/dL 178(H)  BUN 6 - 20 mg/dL 6  Creatinine 0.44 - 1.00 mg/dL 0.54  Sodium 135 - 145 mmol/L 134(L)  Potassium 3.5 - 5.1 mmol/L 4.2  Chloride 98 - 111 mmol/L 105  CO2 22 - 32 mmol/L 16(L)  Calcium 8.9 - 10.3 mg/dL 9.4  Total Protein 6.5 - 8.1 g/dL 6.1(L)  Total Bilirubin 0.3 - 1.2 mg/dL 0.7  Alkaline Phos 38 - 126 U/L 115  AST 15 - 41 U/L 24  ALT 0 - 44 U/L 15   Edinburgh Score: Edinburgh Postnatal Depression Scale Screening Tool 05/02/2020  I have been able to laugh  and see the funny side of things. 0  I have looked forward with enjoyment to things. 0  I have blamed myself unnecessarily when things went wrong. 1  I have been anxious or worried for no good reason. 2  I have felt scared or panicky for no good reason. 2  Things have been getting on top of me. 1  I have been so unhappy that I have had difficulty sleeping. 1  I have felt sad or miserable. 1  I have been so unhappy that I have been crying. 2  The thought of harming myself has occurred to me. 0  Edinburgh Postnatal Depression Scale Total 10     After visit meds:  Allergies as of 05/02/2020      Reactions   Hydroxyzine Hcl Other (See Comments)   "Made me feel like I was going nuts".    Subutex [buprenorphine]  Itching   Vicodin [hydrocodone-acetaminophen] Itching      Medication List    STOP taking these medications   Accu-Chek FastClix Lancets Misc   Accu-Chek Guide test strip Generic drug: glucose blood   Blood Pressure Kit Devi   cyclobenzaprine 10 MG tablet Commonly known as: FLEXERIL   ferrous sulfate 325 (65 FE) MG EC tablet   insulin NPH Human 100 UNIT/ML injection Commonly known as: NOVOLIN N   Insulin Pen Needle 31G X 6 MM Misc   Insulin Syringe-Needle U-100 27G X 1/2" 0.5 ML Misc   zolpidem 5 MG tablet Commonly known as: AMBIEN     TAKE these medications   clobetasol ointment 0.05 % Commonly known as: TEMOVATE Apply 1 application topically 2 (two) times daily as needed for dry skin.   gabapentin 300 MG capsule Commonly known as: NEURONTIN Take 1 capsule (300 mg total) by mouth 3 (three) times daily.   ibuprofen 600 MG tablet Commonly known as: ADVIL Take 1 tablet (600 mg total) by mouth every 6 (six) hours as needed for mild pain.   oxyCODONE 15 MG immediate release tablet Commonly known as: ROXICODONE Take 10 mg by mouth every 4 (four) hours as needed for pain.   oxyCODONE 15 MG immediate release tablet Commonly known as: ROXICODONE Take 15 mg by mouth every 8 (eight) hours.   Oxycodone HCl 10 MG Tabs Take 10 mg by mouth every 8 (eight) hours.   OXYCODONE ER PO Take 15 mg by mouth 3 (three) times daily.   Vitafol-Nano 18-0.6-0.4 MG Tabs Take 1 tablet by mouth daily.        Discharge home in stable condition Infant Feeding: Breast Infant Disposition:home with mother Discharge instruction: per After Visit Summary and Postpartum booklet. Activity: Advance as tolerated. Pelvic rest for 6 weeks.  Diet: routine diet Future Appointments: Future Appointments  Date Time Provider [place]  05/07/2020 10:30 AM [facility] None  05/08/2020 11:00 AM [facility] NURSE [facility] None  05/29/2020  3:00 PM Nugent, Gerrie Nordmann, NP [facility] None   06/12/2020  9:15 AM [facility] LAB [facility] None   Follow up Visit:   Please schedule this patient for a In person postpartum visit in 4 weeks with the following provider: Any provider. Additional Postpartum F/U:Postpartum Depression checkup, 2 hour GTT in 4-6 weeks, BP check 1 week and IUD insertion at [facility] visit.   High risk pregnancy complicated by: GDM and HTN Delivery mode:  Vaginal, Spontaneous  Anticipated Birth Control:  IUD   05/02/2020 Myrtis Ser, CNM

## 2020-05-01 NOTE — Anesthesia Procedure Notes (Signed)
Epidural Patient location during procedure: OB Start time: 05/01/2020 2:26 AM End time: 05/01/2020 2:30 AM  Staffing Anesthesiologist: Bethena Midget, MD  Preanesthetic Checklist Completed: patient identified, IV checked, site marked, risks and benefits discussed, surgical consent, monitors and equipment checked, pre-op evaluation and timeout performed  Epidural Patient position: sitting Prep: DuraPrep and site prepped and draped Patient monitoring: continuous pulse ox and blood pressure Approach: midline Location: L3-L4 Injection technique: LOR air  Needle:  Needle type: Tuohy  Needle gauge: 17 G Needle length: 9 cm and 9 Needle insertion depth: 8 cm Catheter type: closed end flexible Catheter size: 19 Gauge Catheter at skin depth: 13 cm Test dose: negative  Assessment Events: blood not aspirated, injection not painful, no injection resistance, no paresthesia and negative IV test

## 2020-05-01 NOTE — Anesthesia Postprocedure Evaluation (Signed)
Anesthesia Post Note  Patient: Zionna Homewood  Procedure(s) Performed: AN AD HOC LABOR EPIDURAL     Patient location during evaluation: Mother Baby Anesthesia Type: Epidural Level of consciousness: awake and alert Pain management: pain level controlled Vital Signs Assessment: post-procedure vital signs reviewed and stable Respiratory status: spontaneous breathing, nonlabored ventilation and respiratory function stable Cardiovascular status: stable Postop Assessment: no headache, no backache and epidural receding Anesthetic complications: no    Last Vitals:  Vitals:   05/01/20 0616 05/01/20 0631  BP: (!) 111/46 110/69  Pulse: (!) 114 (!) 114  Resp:    Temp:    SpO2:      Last Pain:  Vitals:   05/01/20 0501  TempSrc: Oral  PainSc:    Pain Goal: Patients Stated Pain Goal: 6 (05/01/20 0035)              Epidural/Spinal Function Cutaneous sensation: Able to Wiggle Toes (05/01/20 0631), Patient able to flex knees: Yes (05/01/20 0631), Patient able to lift hips off bed: Yes (05/01/20 0631), Back pain beyond tenderness at insertion site: No (05/01/20 0631), Progressively worsening motor and/or sensory loss: No (05/01/20 0631), Bowel and/or bladder incontinence post epidural: No (05/01/20 0631)  Maeci Kalbfleisch

## 2020-05-01 NOTE — Anesthesia Preprocedure Evaluation (Signed)
Anesthesia Evaluation  Patient identified by MRN, date of birth, ID band Patient awake    Reviewed: Allergy & Precautions, H&P , NPO status , Patient's Chart, lab work & pertinent test results, reviewed documented beta blocker date and time   Airway Mallampati: I  TM Distance: >3 FB Neck ROM: Full    Dental no notable dental hx.    Pulmonary neg pulmonary ROS,    Pulmonary exam normal breath sounds clear to auscultation       Cardiovascular hypertension, Normal cardiovascular exam Rhythm:Regular Rate:Normal     Neuro/Psych  Headaches, PSYCHIATRIC DISORDERS Anxiety Depression  Neuromuscular disease negative neurological ROS  negative psych ROS   GI/Hepatic Neg liver ROS, GERD  Medicated and Controlled,  Endo/Other  diabetes, Well Controlled, Gestational, Insulin DependentMorbid obesity  Renal/GU negative Renal ROS  negative genitourinary   Musculoskeletal Back pain with sciatica   Abdominal (+) + obese,   Peds  Hematology  (+) Blood dyscrasia, anemia ,   Anesthesia Other Findings   Reproductive/Obstetrics (+) Pregnancy Grand multipara AMA                             Anesthesia Physical  Anesthesia Plan  ASA: III  Anesthesia Plan: Epidural   Post-op Pain Management:    Induction:   PONV Risk Score and Plan:   Airway Management Planned: Natural Airway  Additional Equipment:   Intra-op Plan:   Post-operative Plan:   Informed Consent: I have reviewed the patients History and Physical, chart, labs and discussed the procedure including the risks, benefits and alternatives for the proposed anesthesia with the patient or authorized representative who has indicated his/her understanding and acceptance.     Dental Advisory Given  Plan Discussed with: Anesthesiologist  Anesthesia Plan Comments: (Labs checked- platelets confirmed with RN in room. Fetal heart tracing, per RN,  reported to be stable enough for sitting procedure. Discussed epidural, and patient consents to the procedure:  included risk of possible headache,backache, failed block, allergic reaction, and nerve injury. This patient was asked if she had any questions or concerns before the procedure started.)        Anesthesia Quick Evaluation

## 2020-05-01 NOTE — Progress Notes (Signed)
Labor Progress Note Nykira Reddix is a 44 y.o. [id number] at [redacted]w[redacted]d presented for SOL.  S: Comfortable with epidural.   O:  BP (!) 141/63   Pulse (!) 121   Temp 98.1 F (36.7 C) (Oral)   Resp 16   Ht 5' 6.25" (1.683 m)   Wt 132.5 kg   SpO2 99%   BMI 46.78 kg/m  EFM: 140, moderate variability, pos accels, no decels, reactive TOCO: q2-21m  CVE: Dilation: 6.5 Effacement (%): 70 Cervical Position: Posterior Station: -3 Presentation: Vertex Exam by:: Dr. Morene Antu   A&P: 44 y.o. [id number] [redacted]w[redacted]d here for SOL. #Labor: Cont Pit. AROM with copious clear fluid at this exam. TXA ordered prior to delivery. Anticipate SVD. #Pain: per patient request #FWB: Cat I #GBS negative #GDMA1: has never had elevated A1c, currently not on meds but was on insulin for a short time earlier in pregnancy, last glucose 121 #gHTN: mild-range pressures, asymptomatic  Joselyn Arrow, MD 3:17 AM

## 2020-05-02 ENCOUNTER — Ambulatory Visit: Payer: Medicaid Other

## 2020-05-02 LAB — GLUCOSE, CAPILLARY: Glucose-Capillary: 142 mg/dL — ABNORMAL HIGH (ref 70–99)

## 2020-05-02 MED ORDER — IBUPROFEN 600 MG PO TABS: 600.0000 mg | ORAL_TABLET | Freq: Four times a day (QID) | ORAL | 0 refills | Status: DC | PRN

## 2020-05-02 NOTE — Discharge Instructions (Signed)

## 2020-05-02 NOTE — Clinical Social Work Maternal (Signed)
CLINICAL SOCIAL WORK MATERNAL/CHILD NOTE  Patient Details  Name: Shelly Lane MRN: [id number] Date of Birth: June 14, 1976  Date:  05/02/2020  Clinical Social Worker Initiating Note:  Durward Fortes, LCSW Date/Time: Initiated:  05/02/20/0845     Child's Name:  Shelly Lane   Biological Parents:  Mother, Father(Bevely Digioia, [place])   Need for Interpreter:  None   Reason for Referral:  Current Substance Use/Substance Use During Pregnancy (MOB had prescription for Oxycodone)   Address:  [address] [place] [address]    Phone number:  [phone] (home)     Additional phone number: none   Household Members/Support Persons (HM/SP):   Household Member/Support Person 2, Household Member/Support Person 3, Household Member/Support Person 1, Household Member/Support Person 4   HM/SP Name Relationship DOB or Age  HM/SP -11  [place] Nakay[place] Dia   son   26 years old   HM/SP -2  [place]   daughter   43 years old   HM/SP -67  [place]   daughter   85 years old   HM/SP -45  [place]   daughter   27 years old   HM/SP -[place]   daughter   10 years old   HM/SP -24  [name] Dia   daughter   27 years old   HM/SP -70  Rayah Dia   daughter   40 years old   HM/SP -75  Muhamad Dia   son   8 years old     Natural Supports (not living in the home):  Extended Family   Professional Supports: None   Employment: Self-employed   Type of Work: MOB reports that she works for herself.,   Education:  High school graduate   Homebound arranged:  n/a  Museum/gallery curator Resources:  Medicaid   Other Resources:  Physicist, medical    Cultural/Religious Considerations Which May Impact Care:  none reported to this CSW.   Strengths:  Ability to meet basic needs , Compliance with medical plan , Home prepared for child , Pediatrician chosen   Psychotropic Medications:    none reported to CSW at this time.      Pediatrician:    [place]  Pediatrician List:   [facility] [place]      Pediatrician Fax Number:    Risk Factors/Current Problems:  None   Cognitive State:  Able to Concentrate , Insightful , Alert    Mood/Affect:  Interested , Relaxed , Comfortable , Calm , Happy    CSW Assessment: CSW consulted as MOB had prescription for Oxycodone during this pregnancy. C SW went to speak with MOB at bedside to address further needs.   CSW congratulated MOB on the birth of infant. CSW advised MOB of CSW's role and the reason for CSW coming to visit with her. MOB reported that she has a hx of chronic back pain which she was given Oxycodone for. MOB reports that she has been taking this medication for some time as she reported that "since my 8th child ive been having more back pain". CSW understanding of this and was advised that MOB takes this mediation usually daily. MOB reports that she got the medication this morning. CSW advised MOB of hospital drug screen policy and reported to [facility] the reason for infant being drug screened. MOB reported that she understood  and reported no other concerns or questions. CSW dd advised MOB that if infants CDS is positive for any other substances that MOB was given or prescribed by an MD then CSW would need to make CPS report. MOB denied any other use and reported no CPS involvement.   CSW inquired from MOB on her her mental health. 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 CSW asked MOB if she was on medications for this and MOB reports that she was in the past however nothing at this time. MOB reported that she hasn't dealt with PPD since she gave birth and reported no desire for further medication management or therapy resources. MOB does report that she spoke with a therapist in the past but also reported no desire for therapy resources at  this time. MOB informed CSW that she has the ability to follow up with therapist as needed however. MOB denies SI, HI and reports no DV or hx of DV.   CSW was informed that MOB has 11 children in total. MOB reported that she has support from her daughter, sister, and FOB when he is home. 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. MOB reported that she has all needed items to care for infant with no other needs at this time.   CSW provided MOB with PPD and SIDS education. MOB was given PP Checklist in order to keep track of feelings as they relate to PPD. MOB reported that she has been feeling fine since giving birth.  CSW will continue to  monitor infants CDS and make CPS report if warranted.   CSW Plan/Description:  No Further Intervention Required/No Barriers to Discharge, Sudden Infant Death Syndrome (SIDS) Education, Perinatal Mood and Anxiety Disorder (PMADs) Education, CSW Will Continue to Monitor Umbilical Cord Tissue Drug Screen Results and Make Report if [facility] Drug Screen Policy Information    Robb Matar, Theresia Majors 05/02/2020, 9:07 AM

## 2020-05-02 NOTE — Progress Notes (Signed)
Post Partum Day 1  Subjective:  Shelly Lane is a 44 y.o. [id number] [redacted]w[redacted]d s/p VD.  No acute events overnight.  Pt denies problems with ambulating, voiding or po intake.  She denies nausea or vomiting.  Pain is moderately controlled; reports 10/10 pain yesterday postpartum, 7/10 with breastfeeding, and 4/10 at rest.  She has had flatus. She has not had bowel movement.  Lochia Moderate; same as period, but improving.  Plan for birth control is IUD.  Method of Feeding: breast   Objective: BP (!) 103/56 (BP Location: Left Arm)   Pulse 100   Temp 98.6 F (37 C) (Oral)   Resp 18   Ht 5' 6.25" (1.683 m)   Wt 132.5 kg   SpO2 98%   Breastfeeding Unknown   BMI 46.78 kg/m   Physical Exam:  General: alert, cooperative and no distress Lochia:normal flow Chest: normal work of breathing Heart: regular rate Abdomen: soft, nontender, fundus firm at/below umbilicus Uterine Fundus: firm DVT Evaluation: No evidence of DVT seen on physical exam. Extremities: trace edema  Recent Labs    04/30/20 1848 05/01/20 0151  HGB 12.6 13.1  HCT 38.8 39.9    Assessment/Plan:  ASSESSMENT: Shelly Lane is a 44 y.o. [id number] [redacted]w[redacted]d ppd #1 s/p NSVD doing well.   Discharge home and Circumcision prior to discharge   LOS: 2 days   Shelly Lane 05/02/2020, 7:17 AM

## 2020-05-03 ENCOUNTER — Ambulatory Visit: Payer: Self-pay

## 2020-05-03 NOTE — Lactation Note (Signed)
This note was copied from a baby's chart. Lactation Consultation Note  Patient Name: Shelly Lane Date: 05/03/2020 Reason for consult: Follow-up assessment   Mother is a P 34 , Infant 68 hours old. Mother reports that infant is frantic and wants to breastfeed all the time. Mother request a [facility] consult.   Mother reports that she started pumping and hand expressing during pregnancy . She brought colostrum to the hospital and now has used it all.  Mother inquiring about donor milk outside of hospital and or shared milk . LC informed her that she was unaware of available milk.  Staff nurse Maralyn Sago suggested that she use donor milk while still admitted to the hospital. Mother agreeable .  Infant was given 20 ml of donor milk by mother with a curved tip syringe at the breast.   Mother reports that her husband is unavailable to help her because they have so many children . He has to stay home and care for the small ones.  Mother reports that she is very tired . She will have help when she goes home.  . .  Plan of Care : Breastfeed infant with feeding cues Supplement infant with ebm/ donor milk according to supplemental guidelines. Pump using Spectra pump at the bedside  after each feeding for 15-20 mins.  Discussed treatment and prevention for engorgement.   Mother to continue to cue base feed infant and feed at least 8-12 times or more in 24 hours and advised to allow for cluster feeding infant as needed.    Mother to continue to due STS. Mother is aware of available LC services at [facility], BFSG'S, OP Dept, and phone # for questions or concerns about breastfeeding.  Mother receptive to all teaching and plan of care.     Maternal Data    Feeding Feeding Type: Breast Fed  LATCH Score                   Interventions Interventions: Skin to skin;Hand express;Adjust position;Support pillows;Position options;Expressed milk  Lactation Tools Discussed/Used      Consult Status Consult Status: Complete    Michel Bickers 05/03/2020, 12:15 PM

## 2020-05-05 ENCOUNTER — Encounter (HOSPITAL_COMMUNITY): Payer: Self-pay | Admitting: Obstetrics and Gynecology

## 2020-05-05 ENCOUNTER — Inpatient Hospital Stay (HOSPITAL_COMMUNITY)
Admission: AD | Admit: 2020-05-05 | Discharge: 2020-05-05 | Disposition: A | Discharge status: Home / Self Care | Payer: Medicaid Other | Attending: Obstetrics and Gynecology | Admitting: Obstetrics and Gynecology

## 2020-05-05 ENCOUNTER — Other Ambulatory Visit: Payer: Self-pay

## 2020-05-05 DIAGNOSIS — Z791 Long term (current) use of non-steroidal anti-inflammatories (NSAID): Secondary | ICD-10-CM | POA: Diagnosis not present

## 2020-05-05 DIAGNOSIS — K219 Gastro-esophageal reflux disease without esophagitis: Secondary | ICD-10-CM | POA: Insufficient documentation

## 2020-05-05 DIAGNOSIS — Z833 Family history of diabetes mellitus: Secondary | ICD-10-CM | POA: Diagnosis not present

## 2020-05-05 DIAGNOSIS — Z3A38 38 weeks gestation of pregnancy: Secondary | ICD-10-CM

## 2020-05-05 DIAGNOSIS — O1203 Gestational edema, third trimester: Secondary | ICD-10-CM | POA: Diagnosis not present

## 2020-05-05 DIAGNOSIS — Z79899 Other long term (current) drug therapy: Secondary | ICD-10-CM | POA: Insufficient documentation

## 2020-05-05 DIAGNOSIS — G44201 Tension-type headache, unspecified, intractable: Secondary | ICD-10-CM

## 2020-05-05 DIAGNOSIS — M549 Dorsalgia, unspecified: Secondary | ICD-10-CM | POA: Insufficient documentation

## 2020-05-05 DIAGNOSIS — O26893 Other specified pregnancy related conditions, third trimester: Secondary | ICD-10-CM | POA: Insufficient documentation

## 2020-05-05 DIAGNOSIS — O99613 Diseases of the digestive system complicating pregnancy, third trimester: Secondary | ICD-10-CM | POA: Insufficient documentation

## 2020-05-05 DIAGNOSIS — R030 Elevated blood-pressure reading, without diagnosis of hypertension: Secondary | ICD-10-CM

## 2020-05-05 DIAGNOSIS — O24913 Unspecified diabetes mellitus in pregnancy, third trimester: Secondary | ICD-10-CM | POA: Insufficient documentation

## 2020-05-05 DIAGNOSIS — Z8249 Family history of ischemic heart disease and other diseases of the circulatory system: Secondary | ICD-10-CM | POA: Diagnosis not present

## 2020-05-05 DIAGNOSIS — Z885 Allergy status to narcotic agent status: Secondary | ICD-10-CM | POA: Diagnosis not present

## 2020-05-05 DIAGNOSIS — Z79891 Long term (current) use of opiate analgesic: Secondary | ICD-10-CM | POA: Diagnosis not present

## 2020-05-05 DIAGNOSIS — Z888 Allergy status to other drugs, medicaments and biological substances status: Secondary | ICD-10-CM | POA: Insufficient documentation

## 2020-05-05 DIAGNOSIS — O1403 Mild to moderate pre-eclampsia, third trimester: Secondary | ICD-10-CM | POA: Diagnosis not present

## 2020-05-05 DIAGNOSIS — G8929 Other chronic pain: Secondary | ICD-10-CM | POA: Diagnosis not present

## 2020-05-05 LAB — CBC
HCT: 36.1 % (ref 36.0–46.0)
Hemoglobin: 11.5 g/dL — ABNORMAL LOW (ref 12.0–15.0)
MCH: 28.9 pg (ref 26.0–34.0)
MCHC: 31.9 g/dL (ref 30.0–36.0)
MCV: 90.7 fL (ref 80.0–100.0)
Platelets: 253 10*3/uL (ref 150–400)
RBC: 3.98 MIL/uL (ref 3.87–5.11)
RDW: 14.8 % (ref 11.5–15.5)
WBC: 9.5 10*3/uL (ref 4.0–10.5)
nRBC: 0 % (ref 0.0–0.2)

## 2020-05-05 LAB — COMPREHENSIVE METABOLIC PANEL
ALT: 28 U/L (ref 0–44)
AST: 33 U/L (ref 15–41)
Albumin: 2.5 g/dL — ABNORMAL LOW (ref 3.5–5.0)
Alkaline Phosphatase: 85 U/L (ref 38–126)
Anion gap: 8 (ref 5–15)
BUN: 10 mg/dL (ref 6–20)
CO2: 26 mmol/L (ref 22–32)
Calcium: 8.7 mg/dL — ABNORMAL LOW (ref 8.9–10.3)
Chloride: 105 mmol/L (ref 98–111)
Creatinine, Ser: 0.52 mg/dL (ref 0.44–1.00)
GFR calc Af Amer: >60 mL/min (ref >60)
GFR calc non Af Amer: >60 mL/min (ref >60)
Glucose, Bld: 71 mg/dL (ref 70–99)
Potassium: 3.9 mmol/L (ref 3.5–5.1)
Sodium: 139 mmol/L (ref 135–145)
Total Bilirubin: 0.6 mg/dL (ref 0.3–1.2)
Total Protein: 5.7 g/dL — ABNORMAL LOW (ref 6.5–8.1)

## 2020-05-05 LAB — URINALYSIS, ROUTINE W REFLEX MICROSCOPIC
Bilirubin Urine: NEGATIVE
Glucose, UA: NEGATIVE mg/dL
Ketones, ur: NEGATIVE mg/dL
Leukocytes,Ua: NEGATIVE
Nitrite: NEGATIVE
Protein, ur: NEGATIVE mg/dL
Specific Gravity, Urine: 1.003 — ABNORMAL LOW (ref 1.005–1.030)
pH: 7 (ref 5.0–8.0)

## 2020-05-05 MED ORDER — METOCLOPRAMIDE HCL 5 MG/ML IJ SOLN
10.0000 mg | Freq: Once | INTRAMUSCULAR | Status: AC
  Administered 2020-05-05: 10 mg via INTRAVENOUS
  Filled 2020-05-05: qty 2

## 2020-05-05 MED ORDER — DEXAMETHASONE SODIUM PHOSPHATE 10 MG/ML IJ SOLN
10.0000 mg | Freq: Once | INTRAMUSCULAR | Status: AC
  Administered 2020-05-05: 10 mg via INTRAVENOUS
  Filled 2020-05-05: qty 1

## 2020-05-05 MED ORDER — TRIAMTERENE-HCTZ 37.5-25 MG PO CAPS: 1.0000 | ORAL_CAPSULE | Freq: Every day | ORAL | 0 refills | Status: DC

## 2020-05-05 MED ORDER — DIPHENHYDRAMINE HCL 50 MG/ML IJ SOLN
12.5000 mg | Freq: Once | INTRAMUSCULAR | Status: AC
  Administered 2020-05-05: 12.5 mg via INTRAVENOUS
  Filled 2020-05-05: qty 1

## 2020-05-05 NOTE — MAU Note (Addendum)
Pt reports to mau pp vag delivery with c/o elevated bp a few hours ago.  Pt reports headache and floaters.  Pt also reports swelling in her lower extremities.  Pt complaining of rt upper quad pain since earlier today.

## 2020-05-05 NOTE — Discharge Instructions (Signed)
Hydrochlorothiazide, HCTZ; Triamterene Oral Tablets or Capsules What is this medicine? HYDROCHLOROTHIAZIDE; TRIAMTERENE (hye droe klor oh THYE a zide; trye AM ter een) is a combination of 2 diuretics. It helps you make more urine and to lose salt and excess water from your body. It treats swelling from heart, kidney, or liver disease. It also treats high blood pressure. This medicine may be used for other purposes; ask your health care provider or pharmacist if you have questions. COMMON BRAND NAME(S): Dyazide, Maxzide What should I tell my health care provider before I take this medicine? They need to know if you have any of these conditions:  diabetes  immune system problems, like lupus  kidney disease or stones  liver disease  small amount of urine or difficulty passing urine  an unusual or allergic reaction to triamterene, hydrochlorothiazide, sulfa drugs, other medicines, foods, dyes, or preservatives  pregnant or trying to get pregnant  breast-feeding How should I use this medicine? Take this drug by mouth. Take it as directed on the prescription label at the same time every day. You can take it with or without food. If it upsets your stomach, take it with food. Keep taking it unless your health care provider tells you to stop. Talk to your health care provider about the use of this drug in children. Special care may be needed. Overdosage: If you think you have taken too much of this medicine contact a poison control center or emergency room at once. NOTE: This medicine is only for you. Do not share this medicine with others. What if I miss a dose? If you miss a dose, take it as soon as you can. If it is almost time for your next dose, take only that dose. Do not take double or extra doses. What may interact with this medicine? Do not take this medicine with any of the following medications:  cidofovir  dofetilide  eplerenone  potassium  supplements  tranylcypromine This medicine may also interact with the following medications:  certain medicines for blood pressure, heart disease like benazepril, lisinopril, losartan, valsartan  lithium  medicines for diabetes  medicines that relax muscles for surgery  NSAIDs, medicines for pain and inflammation, like ibuprofen or naproxen  other diuretics  penicillin G potassium This list may not describe all possible interactions. Give your health care provider a list of all the medicines, herbs, non-prescription drugs, or dietary supplements you use. Also tell them if you smoke, drink alcohol, or use illegal drugs. Some items may interact with your medicine. What should I watch for while using this medicine? Visit your doctor or health care professional for regular check-ups. You will need lab work done before you start this medicine and regularly while you are taking it. Check your blood pressure regularly. Ask your health care professional what your blood pressure should be, and when you should contact them. This medicine may increase blood sugar. Ask your healthcare provider if changes in diet or medicines are needed if you have diabetes. You may need to be on a special diet while taking this medicine. Ask your doctor. Also, ask how many glasses of fluid you need to drink a day. You must not get dehydrated. You may get drowsy or dizzy. Do not drive, use machinery, or do anything that needs mental alertness until you know how this medicine affects you. Do not stand or sit up quickly, especially if you are an older patient. This reduces the risk of dizzy or fainting spells. Alcohol   may interfere with the effect of this medicine. Avoid or limit alcoholic drinks. Talk to your health care professional about your risk of skin cancer. You may be more at risk for skin cancer if you take this medicine. This medicine can make you more sensitive to the sun. Keep out of the sun. If you cannot  avoid being in the sun, wear protective clothing and use sunscreen. Do not use sun lamps or tanning beds/booths. What side effects may I notice from receiving this medicine? Side effects that you should report to your doctor or health care professional as soon as possible:  allergic reactions such as skin rash or itching, hives, swelling of the lips, mouth, tongue, or throat  changes in vision  eye pain  fast or irregular heartbeat, chest pain  feeling faint or dizzy  gout attack  muscle pain or cramps  numbness or tingling in hands, feet, or lips  pain or difficulty when passing urine  redness, blistering, peeling or loosening of the skin, including inside the mouth   signs and symptoms of high blood sugar such as being more thirsty or hungry or having to urinate more than normal. You may also feel very tired or have blurry vision.  shortness of breath  unusually weak Side effects that usually do not require medical attention (report to your doctor or health care professional if they continue or are bothersome):  change in sex drive or performance  dry mouth  headache  stomach upset This list may not describe all possible side effects. Call your doctor for medical advice about side effects. You may report side effects to FDA at 1-800-FDA-1088. Where should I keep my medicine? Keep out of the reach of children and pets. Store at room temperature between 20 and 25 degrees C (68 and 77 degrees F). Protect from light. Throw away any unused drug after the expiration date. NOTE: This sheet is a summary. It may not cover all possible information. If you have questions about this medicine, talk to your doctor, pharmacist, or health care provider.  2020 Elsevier/Gold Standard (2019-07-31 13:00:43)  

## 2020-05-05 NOTE — MAU Provider Note (Addendum)
History     CSN: [id number]  Arrival date and time: 05/05/20 9833   First Provider Initiated Contact with Patient 05/05/20 1814      Chief Complaint  Patient presents with  . Hypertension  . Headache   [name] is a 44 y.o. [id number] at [redacted]w[redacted]d who presents to MAU for preeclampsia evaluation after her friend scared her. Patient checked her BP at [facility] and had a pharmacist check it x2 and the first one was 165/113 and the second was 185/110. Patient reports this was taken with an electronic cuff. Patient reports she currently has a minor HA that she rates as 3-4/10 as well as swelling in feet and hands, which she reports is getting worse since delivery. Patient denies taking anything for her headache except for her oxycodone 15mg , ibuprofen 600mg , Flexeril 10mg , gabapentin 300mg  around 10AM when she woke up. Patient reports she was sitting in bed and just started seeing little bright lights for a few minutes, which then stopped, pt also reports she saw them once yesterday as well. Patient delivered on 05/01/2020 NSVD.  Pt denies blurry vision, N/V, epigastric pain, swelling in face and hands, sudden weight gain. Pt denies chest pain and SOB.  Pt denies constipation, diarrhea, or urinary problems. Pt denies fever, chills, fatigue, sweating or changes in appetite. Pt denies dizziness, light-headedness, weakness.  Current pregnancy problems? T2DM, chronic back pain with opioid use Blood Type? O Positive Allergies? Hydroxyzine, subutex, vicodin Current medications? oxycodone 15mg , ibuprofen 600mg , Flexeril 10mg , gabapentin 300mg , herbs for breastmilk, PNVs Current PNC & next appt? [facility], pt unsure    OB History    Gravida  13   Para  11   Term  9   Preterm  2   AB  2   Living  11     SAB  2   TAB      Ectopic      Multiple  0   Live Births  11           Past Medical History:  Diagnosis Date  . Abnormal Pap smear    Colpo;Last pap 2012, ASCUS w/  negative HPV  . Anemia    during preg  . Anxiety   . Eczema   . GERD (gastroesophageal reflux disease)   . Gestational diabetes    diet controlled  . H/O migraine   . H/O rubella   . H/O varicella   . H/O: depression   . H/O: obesity   . Hx: UTI (urinary tract infection)   . Opioid use   . Preterm labor   . Vaginal Pap smear, abnormal     Past Surgical History:  Procedure Laterality Date  . CHOLECYSTECTOMY  2008  . HERNIA REPAIR  2012  . HIATAL HERNIA REPAIR  2012  . WISDOM TOOTH EXTRACTION      Family History  Problem Relation Age of Onset  . Diabetes Maternal Grandmother   . Hypertension Maternal Grandmother   . Anemia Mother     Social History   Tobacco Use  . Smoking status: Never Smoker  . Smokeless tobacco: Never Used  Substance Use Topics  . Alcohol use: No  . Drug use: Yes    Types: Oxycodone    Comment: per pt prescribed by physician for chronic back pain    Allergies:  Allergies  Allergen Reactions  . Hydroxyzine Hcl Other (See Comments)    "Made me feel like I was going nuts".   . Subutex [  Buprenorphine] Itching  . Vicodin [Hydrocodone-Acetaminophen] Itching    Medications Prior to Admission  Medication Sig Dispense Refill Last Dose  . gabapentin (NEURONTIN) 300 MG capsule Take 1 capsule (300 mg total) by mouth 3 (three) times daily. 90 capsule 3 05/05/2020 at Unknown time  . ibuprofen (ADVIL) 600 MG tablet Take 1 tablet (600 mg total) by mouth every 6 (six) hours as needed for mild pain. 30 tablet 0 05/05/2020 at Unknown time  . oxyCODONE (ROXICODONE) 15 MG immediate release tablet Take 15 mg by mouth every 8 (eight) hours.   05/05/2020 at Unknown time  . clobetasol ointment (TEMOVATE) 4.09 % Apply 1 application topically 2 (two) times daily as needed for dry skin.     Marland Kitchen oxyCODONE (ROXICODONE) 15 MG immediate release tablet Take 10 mg by mouth every 4 (four) hours as needed for pain.     . OXYCODONE ER PO Take 15 mg by mouth 3 (three) times  daily.      . Oxycodone HCl 10 MG TABS Take 10 mg by mouth every 8 (eight) hours.     . Prenatal-Fe Fum-Methf-FA w/o A (VITAFOL-NANO) 18-0.6-0.4 MG TABS Take 1 tablet by mouth daily. 30 tablet 12     Review of Systems  Constitutional: Negative for chills, diaphoresis, fatigue and fever.  Eyes: Negative for visual disturbance.  Respiratory: Negative for shortness of breath.   Cardiovascular: Negative for chest pain.  Gastrointestinal: Negative for abdominal pain, constipation, diarrhea, nausea and vomiting.  Genitourinary: Negative for dysuria, flank pain, frequency, pelvic pain, urgency, vaginal bleeding and vaginal discharge.  Musculoskeletal:       Swelling in hands and feet.  Neurological: Positive for headaches. Negative for dizziness, weakness and light-headedness.   Physical Exam   Blood pressure 116/71, pulse (!) 103, temperature 98.4 F (36.9 C), temperature source Oral, resp. rate 18, SpO2 98 %, unknown if currently breastfeeding.  Patient Vitals for the past 24 hrs:  BP Temp Temp src Pulse Resp SpO2  05/05/20 1946 116/71 -- -- (!) 103 -- --  05/05/20 1934 124/74 -- -- (!) 101 -- --  05/05/20 1916 (!) 130/95 -- -- (!) 102 -- --  05/05/20 1901 (!) 138/102 -- -- 96 -- --  05/05/20 1847 131/81 -- -- (!) 104 -- --  05/05/20 1816 125/65 -- -- (!) 104 -- --  05/05/20 1801 (!) 108/58 -- -- (!) 108 -- --  05/05/20 1750 -- -- -- -- -- 98 %  05/05/20 1745 -- -- -- -- -- 99 %  05/05/20 1743 128/82 -- -- (!) 107 -- --  05/05/20 1742 128/82 98.4 F (36.9 C) Oral (!) 113 18 (!) 2 %   Physical Exam  Constitutional: She is oriented to person, place, and time. She appears well-developed and well-nourished. No distress.  HENT:  Head: Normocephalic and atraumatic.  Respiratory: Effort normal.  Neurological: She is alert and oriented to person, place, and time.  Skin: She is not diaphoretic.  Psychiatric: She has a normal mood and affect. Her behavior is normal. Judgment and thought  content normal.   Results for orders placed or performed during the hospital encounter of 05/05/20 (from the past 24 hour(s))  CBC     Status: Abnormal   Collection Time: 05/05/20  7:17 PM  Result Value Ref Range   WBC 9.5 4.0 - 10.5 K/uL   RBC 3.98 3.87 - 5.11 MIL/uL   Hemoglobin 11.5 (L) 12.0 - 15.0 g/dL   HCT 36.1 36.0 - 46.0 %  MCV 90.7 80.0 - 100.0 fL   MCH 28.9 26.0 - 34.0 pg   MCHC 31.9 30.0 - 36.0 g/dL   RDW 11.9 14.7 - 82.9 %   Platelets 253 150 - 400 K/uL   nRBC 0.0 0.0 - 0.2 %  Comprehensive metabolic panel     Status: Abnormal   Collection Time: 05/05/20  7:17 PM  Result Value Ref Range   Sodium 139 135 - 145 mmol/L   Potassium 3.9 3.5 - 5.1 mmol/L   Chloride 105 98 - 111 mmol/L   CO2 26 22 - 32 mmol/L   Glucose, Bld 71 70 - 99 mg/dL   BUN 10 6 - 20 mg/dL   Creatinine, Ser 5.62 0.44 - 1.00 mg/dL   Calcium 8.7 (L) 8.9 - 10.3 mg/dL   Total Protein 5.7 (L) 6.5 - 8.1 g/dL   Albumin 2.5 (L) 3.5 - 5.0 g/dL   AST 33 15 - 41 U/L   ALT 28 0 - 44 U/L   Alkaline Phosphatase 85 38 - 126 U/L   Total Bilirubin 0.6 0.3 - 1.2 mg/dL   GFR calc non Af Amer >60 >60 mL/min   GFR calc Af Amer >60 >60 mL/min   Anion gap 8 5 - 15  Urinalysis, Routine w reflex microscopic     Status: Abnormal   Collection Time: 05/05/20  7:30 PM  Result Value Ref Range   Color, Urine STRAW (A) YELLOW   APPearance CLEAR CLEAR   Specific Gravity, Urine 1.003 (L) 1.005 - 1.030   pH 7.0 5.0 - 8.0   Glucose, UA NEGATIVE NEGATIVE mg/dL   Hgb urine dipstick LARGE (A) NEGATIVE   Bilirubin Urine NEGATIVE NEGATIVE   Ketones, ur NEGATIVE NEGATIVE mg/dL   Protein, ur NEGATIVE NEGATIVE mg/dL   Nitrite NEGATIVE NEGATIVE   Leukocytes,Ua NEGATIVE NEGATIVE   RBC / HPF 6-10 0 - 5 RBC/hpf   WBC, UA 0-5 0 - 5 WBC/hpf   Bacteria, UA RARE (A) NONE SEEN   Squamous Epithelial / LPF 0-5 0 - 5    MAU Course  Procedures  MDM -preeclampsia evaluation without severe range BP in MAU on admission -elevated BP of  138/102 at highest -symptoms include: HA 4/10, unrelieved by oxycodone 15mg , ibuprofen 600mg , Flexeril 10mg , gabapentin 300mg  -UA: pending at time of care transfer -CBC: H/H 11.5/36.1, platelets 253 -consulted with Dr. , will give HA cocktail to attempt to treat HA in light of newly elevated blood pressures, per Dr. , OK to give with medications that patient took this AM, will give without fluids d/t increased swelling -CMP: pending at time of care transfer -care transferred to [facility], CNM @8PM  Nugent, 01-31-1986, NP  8:00 PM 05/05/2020   Orders Placed This Encounter  Procedures  . Urinalysis, Routine w reflex microscopic    Standing Status:   Standing    Number of Occurrences:   1  . CBC    Standing Status:   Standing    Number of Occurrences:   1  . Comprehensive metabolic panel    Standing Status:   Standing    Number of Occurrences:   1  . Insert peripheral IV    Standing Status:   Standing    Number of Occurrences:   1    Assessment and Plan   Reassessment (8:13 PM) -Labs return without significance. -Dr. Vergie Living consulted and agrees with discharge. Advises: *Offer patient Dyazide for edema. -Provider to bedside and patient reports HA has resolved. -  Edema is noted and ~+2 with pitting. -Discussed recommendation for medication to help decrease edema. -Patient agreeable but expresses concern for decreased milk supply. -Discussed short term usage of medication and instructed to discontinue if notices decrease in milk supply. -Patient verbalizes understanding and without further questions or concerns. -Rx for Dyazide Disp 14, RF 0 sent to pharmacy on file.  -Encouraged to call or return to MAU if symptoms worsen or with the onset of new symptoms. -Discharged to home in improved condition.  Cherre Robins MSN, CNM Advanced Practice Provider, [facility] [employer]

## 2020-05-07 ENCOUNTER — Other Ambulatory Visit (HOSPITAL_COMMUNITY): Payer: Medicaid Other

## 2020-05-07 ENCOUNTER — Encounter: Payer: Medicaid Other | Admitting: Obstetrics & Gynecology

## 2020-05-07 ENCOUNTER — Encounter (HOSPITAL_COMMUNITY): Payer: Medicaid Other

## 2020-05-08 ENCOUNTER — Ambulatory Visit: Payer: Medicaid Other

## 2020-05-09 ENCOUNTER — Inpatient Hospital Stay (HOSPITAL_COMMUNITY)
Admission: AD | Admit: 2020-05-09 | Payer: Medicaid Other | Source: Home / Self Care | Admitting: Obstetrics & Gynecology

## 2020-05-09 ENCOUNTER — Inpatient Hospital Stay (HOSPITAL_COMMUNITY): Payer: Medicaid Other

## 2020-05-29 ENCOUNTER — Ambulatory Visit: Payer: Medicaid Other | Admitting: Women's Health

## 2020-06-12 ENCOUNTER — Other Ambulatory Visit: Payer: Medicaid Other

## 2020-06-26 ENCOUNTER — Ambulatory Visit: Payer: Medicaid Other | Admitting: Obstetrics and Gynecology

## 2020-11-06 DIAGNOSIS — Z8616 Personal history of COVID-19: Secondary | ICD-10-CM

## 2020-11-06 HISTORY — DX: Personal history of COVID-19: Z86.16

## 2020-12-03 ENCOUNTER — Ambulatory Visit (HOSPITAL_COMMUNITY)
Admission: EM | Admit: 2020-12-03 | Discharge: 2020-12-03 | Disposition: A | Discharge status: Home / Self Care | Payer: Medicaid Other | Attending: Family Medicine | Admitting: Family Medicine

## 2020-12-03 ENCOUNTER — Other Ambulatory Visit: Payer: Self-pay

## 2020-12-03 ENCOUNTER — Encounter (HOSPITAL_COMMUNITY): Payer: Self-pay

## 2020-12-03 DIAGNOSIS — Z79899 Other long term (current) drug therapy: Secondary | ICD-10-CM | POA: Diagnosis not present

## 2020-12-03 DIAGNOSIS — J069 Acute upper respiratory infection, unspecified: Secondary | ICD-10-CM

## 2020-12-03 DIAGNOSIS — J029 Acute pharyngitis, unspecified: Secondary | ICD-10-CM | POA: Diagnosis not present

## 2020-12-03 DIAGNOSIS — U071 COVID-19: Secondary | ICD-10-CM | POA: Diagnosis present

## 2020-12-03 DIAGNOSIS — Z885 Allergy status to narcotic agent status: Secondary | ICD-10-CM | POA: Diagnosis not present

## 2020-12-03 DIAGNOSIS — R509 Fever, unspecified: Secondary | ICD-10-CM | POA: Insufficient documentation

## 2020-12-03 DIAGNOSIS — R0602 Shortness of breath: Secondary | ICD-10-CM | POA: Insufficient documentation

## 2020-12-03 MED ORDER — PROMETHAZINE-DM 6.25-15 MG/5ML PO SYRP
5.0000 mL | ORAL_SOLUTION | Freq: Four times a day (QID) | ORAL | 0 refills | Status: DC | PRN
Start: 2020-12-03 — End: 2021-01-14

## 2020-12-03 MED ORDER — FLUCONAZOLE 150 MG PO TABS
150.0000 mg | ORAL_TABLET | Freq: Once | ORAL | 0 refills | Status: AC
Start: 2020-12-03 — End: 2020-12-03

## 2020-12-03 MED ORDER — ALBUTEROL SULFATE HFA 108 (90 BASE) MCG/ACT IN AERS: 1.0000 | INHALATION_SPRAY | Freq: Four times a day (QID) | RESPIRATORY_TRACT | 0 refills | Status: DC | PRN

## 2020-12-03 MED ORDER — AZITHROMYCIN 250 MG PO TABS
ORAL_TABLET | ORAL | 0 refills | Status: DC
Start: 2020-12-03 — End: 2021-01-14

## 2020-12-03 NOTE — ED Triage Notes (Signed)
Pt c/o headaches, cough, fever, chills and body aches  X 4 days. Pt states she has been feeling congested and is now having a sore throat and states she has developed a cough along with chest tightness. Pt states it hurts to inhale.

## 2020-12-03 NOTE — ED Provider Notes (Signed)
[facility]    CSN: [id number] Arrival date & time: 12/03/20  1730      History   Chief Complaint Chief Complaint  Patient presents with  . Headache  . Cough  . Chills  . Generalized Body Aches  . Sore Throat    HPI Shelly Lane is a 44 y.o. female.   Here today with 4 day history of headaches, cough, fever, chills, body aches, sore throat. She states she's now having heaviness in her chest and SOB that feels like the past few times when she's had pneumonia. Unable to take deep breaths without having intense coughing fits and SOB. Denies CP, dizziness, syncope, rashes, diarrhea. Kids sick with similar sxs.      Past Medical History:  Diagnosis Date  . Abnormal Pap smear    Colpo;Last pap 2012, ASCUS w/ negative HPV  . Anemia    during preg  . Anxiety   . Eczema   . GERD (gastroesophageal reflux disease)   . Gestational diabetes    diet controlled  . H/O migraine   . H/O rubella   . H/O varicella   . H/O: depression   . H/O: obesity   . Hx: UTI (urinary tract infection)   . Opioid use   . Preterm labor   . Vaginal Pap smear, abnormal     Patient Active Problem List   Diagnosis Date Noted  . Labor and delivery, indication for care 04/30/2020  . Gestational hypertension 04/30/2020  . Diabetes mellitus during pregnancy, antepartum 12/15/2019  . Alpha thalassemia silent carrier 11/19/2019  . Supervision of high risk pregnancy, antepartum 11/07/2019  . Chronic pain 11/06/2019  . History of postpartum depression 07/20/2018  . Pregnancy 07/18/2018  . Sciatic nerve pain, right 05/09/2018  . History of abnormal cervical Pap smear 10/04/2013  . Advanced maternal age in multigravida 02/13/2013  . SAB (spontaneous abortion) x 4 02/13/2013  . Grand multipara 02/13/2013  . Hx of preterm delivery x 2, currently pregnant 02/13/2013  . Hx gestational diabetes x 3 02/13/2013  . Multiple drug allergies 02/13/2013  . BULIMIA 10/21/2007  . ANXIETY  08/02/2007  . OBESITY 08/01/2007  . Depression affecting pregnancy, antepartum 08/01/2007    Past Surgical History:  Procedure Laterality Date  . CHOLECYSTECTOMY  2008  . HERNIA REPAIR  2012  . HIATAL HERNIA REPAIR  2012  . WISDOM TOOTH EXTRACTION      OB History    Gravida  13   Para  11   Term  9   Preterm  2   AB  2   Living  11     SAB  2   IAB      Ectopic      Multiple  0   Live Births  11            Home Medications    Prior to Admission medications   Medication Sig Start Date End Date Taking? Authorizing Provider  albuterol (VENTOLIN HFA) 108 (90 Base) MCG/ACT inhaler Inhale 1-2 puffs into the lungs every 6 (six) hours as needed for wheezing or shortness of breath. 12/03/20  Yes Particia Nearing, PA-C  azithromycin [facility]) 250 MG tablet Take 2 tabs day one, then 1 tab daily until complete 12/03/20  Yes Particia Nearing, PA-C  fluconazole (DIFLUCAN) 150 MG tablet Take 1 tablet (150 mg total) by mouth once for 1 dose. 12/03/20 12/03/20 Yes Particia Nearing, PA-C  promethazine-dextromethorphan (PROMETHAZINE-DM) 6.25-15 MG/5ML syrup Take  5 mLs by mouth 4 (four) times daily as needed for cough. 12/03/20  Yes Particia Nearing, PA-C  clobetasol ointment (TEMOVATE) 0.05 % Apply 1 application topically 2 (two) times daily as needed for dry skin. 04/12/20   [provider]  gabapentin (NEURONTIN) 300 MG capsule Take 1 capsule (300 mg total) by mouth 3 (three) times daily. 02/05/20   Constant, Peggy, MD  ibuprofen (ADVIL) 600 MG tablet Take 1 tablet (600 mg total) by mouth every 6 (six) hours as needed for mild pain. 05/02/20   Arabella Merles, CNM  oxyCODONE (ROXICODONE) 15 MG immediate release tablet Take 10 mg by mouth every 4 (four) hours as needed for pain.    [provider]  oxyCODONE (ROXICODONE) 15 MG immediate release tablet Take 15 mg by mouth every 8 (eight) hours.    [provider]  OXYCODONE ER PO  Take 15 mg by mouth 3 (three) times daily.     [provider]  Oxycodone HCl 10 MG TABS Take 10 mg by mouth every 8 (eight) hours. 12/22/19   [provider]  Prenatal-Fe Fum-Methf-FA w/o A (VITAFOL-NANO) 18-0.6-0.4 MG TABS Take 1 tablet by mouth daily. 11/26/16   Orvilla Cornwall A, CNM  triamterene-hydrochlorothiazide (DYAZIDE) 37.5-25 MG capsule Take 1 each (1 capsule total) by mouth daily. 05/05/20   Gerrit Heck, CNM    Family History Family History  Problem Relation Age of Onset  . Diabetes Maternal Grandmother   . Hypertension Maternal Grandmother   . Anemia Mother     Social History Social History   Tobacco Use  . Smoking status: Never Smoker  . Smokeless tobacco: Never Used  Vaping Use  . Vaping Use: Never used  Substance Use Topics  . Alcohol use: No  . Drug use: Yes    Types: Oxycodone    Comment: per pt prescribed by physician for chronic back pain     Allergies   Hydroxyzine hcl, Subutex [buprenorphine], and Vicodin [hydrocodone-acetaminophen]   Review of Systems Review of Systems PER HPI    Physical Exam Triage Vital Signs ED Triage Vitals  Enc Vitals Group     BP 12/03/20 1947 138/90     Pulse Rate 12/03/20 1947 (!) 115     Resp 12/03/20 1947 15     Temp 12/03/20 1947 98.2 F (36.8 C)     Temp Source 12/03/20 1947 Oral     SpO2 12/03/20 1947 98 %     Weight --      Height --      Head Circumference --      Peak Flow --      Pain Score 12/03/20 1946 4     Pain Loc --      Pain Edu? --      Excl. in GC? --    No data found.  Updated Vital Signs BP 138/90 (BP Location: Right Arm)   Pulse (!) 115   Temp 98.2 F (36.8 C) (Oral)   Resp 15   LMP 11/22/2020 (Exact Date)   SpO2 98%   Visual Acuity Right Eye Distance:   Left Eye Distance:   Bilateral Distance:    Right Eye Near:   Left Eye Near:    Bilateral Near:     Physical Exam Vitals and nursing note reviewed.  Constitutional:      Appearance: Normal  appearance. She is not ill-appearing.  HENT:     Head: Atraumatic.     Right Ear: Tympanic  membrane normal.     Left Ear: Tympanic membrane normal.     Nose: Congestion present.     Mouth/Throat:     Mouth: Mucous membranes are moist.     Pharynx: Posterior oropharyngeal erythema present.  Eyes:     Extraocular Movements: Extraocular movements intact.     Conjunctiva/sclera: Conjunctivae normal.  Cardiovascular:     Rate and Rhythm: Normal rate and regular rhythm.     Heart sounds: Normal heart sounds.  Pulmonary:     Effort: Pulmonary effort is normal.     Breath sounds: Wheezing (diffuse) present.  Musculoskeletal:        General: Normal range of motion.     Cervical back: Normal range of motion and neck supple.  Skin:    General: Skin is warm and dry.  Neurological:     Mental Status: She is alert and oriented to person, place, and time.  Psychiatric:        Mood and Affect: Mood normal.        Thought Content: Thought content normal.        Judgment: Judgment normal.      UC Treatments / Results  Labs (all labs ordered are listed, but only abnormal results are displayed) Labs Reviewed  SARS CORONAVIRUS 2 (TAT 6-24 HRS)    EKG   Radiology No results found.  Procedures Procedures (including critical care time)  Medications Ordered in UC Medications - No data to display  Initial Impression / Assessment and Plan / UC Course  I have reviewed the triage vital signs and the nursing notes.  Pertinent labs & imaging results that were available during my care of the patient were reviewed by me and considered in my medical decision making (see chart for details).     Mildly tachycardic today, O2 saturation 98%, afebrile currently. Given her SOB and wheezes along with history of pneumonia will send zithromax in case worsening sxs. Albuterol inhaler, mucinex, phenergan DM reviewed. Strict return precautions given. COVID pcr pending.   Final Clinical Impressions(s)  / UC Diagnoses   Final diagnoses:  Upper respiratory tract infection, unspecified type   Discharge Instructions   None    ED Prescriptions    Medication Sig Dispense Auth. Provider   azithromycin (ZITHROMAX) 250 MG tablet Take 2 tabs day one, then 1 tab daily until complete 6 tablet Particia Nearing, PA-C   albuterol (VENTOLIN HFA) 108 (90 Base) MCG/ACT inhaler Inhale 1-2 puffs into the lungs every 6 (six) hours as needed for wheezing or shortness of breath. 18 g Roosvelt Maser [place], [place]   promethazine-dextromethorphan (PROMETHAZINE-DM) 6.25-15 MG/5ML syrup Take 5 mLs by mouth 4 (four) times daily as needed for cough. 100 mL Particia Nearing, PA-C   fluconazole (DIFLUCAN) 150 MG tablet Take 1 tablet (150 mg total) by mouth once for 1 dose. 1 tablet Particia Nearing, [place]     PDMP not reviewed this encounter.   Particia Nearing, [place] 12/03/20 2054

## 2020-12-04 LAB — SARS CORONAVIRUS 2 (TAT 6-24 HRS): SARS Coronavirus 2: POSITIVE — AB

## 2021-01-14 ENCOUNTER — Encounter: Payer: Self-pay | Admitting: Obstetrics and Gynecology

## 2021-01-14 ENCOUNTER — Telehealth (INDEPENDENT_AMBULATORY_CARE_PROVIDER_SITE_OTHER): Payer: Medicaid Other | Admitting: Obstetrics and Gynecology

## 2021-01-14 DIAGNOSIS — Z3009 Encounter for other general counseling and advice on contraception: Secondary | ICD-10-CM | POA: Insufficient documentation

## 2021-01-14 MED ORDER — ETONOGESTREL-ETHINYL ESTRADIOL 0.12-0.015 MG/24HR VA RING: VAGINAL_RING | VAGINAL | 12 refills | Status: DC

## 2021-01-14 NOTE — Progress Notes (Signed)
Patient presents for Mychart birth control consult. Patient states that she wants to start on the nuva ring. LMP 01/11/21. Patient states that she just stopped breast feeding.

## 2021-01-14 NOTE — Progress Notes (Signed)
GYNECOLOGY VIRTUAL VISIT ENCOUNTER NOTE  Provider location: [facility] for [facility] [place]   I connected with [name] on 01/14/21 at  4:00 PM EST by MyChart Video Encounter at home and verified that I am speaking with the correct person using two identifiers.   I discussed the limitations, risks, security and privacy concerns of performing an evaluation and management service virtually and the availability of in person appointments. I also discussed with the patient that there may be a patient responsible charge related to this service. The patient expressed understanding and agreed to proceed.   History:  Shakeisha Horine is a 45 y.o. [id number] female being evaluated today for contraceptive counseling. She denies any abnormal vaginal discharge, bleeding, pelvic pain or other concerns.  After chart review, it appears pt never had her postpartum exam after delivery 5/21.  She notes amenorrhea with consistent breast feeding, but now is beginning to wean and her menses are returning.  Discussed birth control options. Pt has tried IUD and nexplanon.  She was not happy with their performance.  The patient has used nuvaring before and would like to try it again since she is no longer breastfeeding.  Common use discussed in detail.     Past Medical History:  Diagnosis Date  . Abnormal Pap smear    Colpo;Last pap 2012, ASCUS w/ negative HPV  . Anemia    during preg  . Anxiety   . Eczema   . GERD (gastroesophageal reflux disease)   . Gestational diabetes    diet controlled  . H/O migraine   . H/O rubella   . H/O varicella   . H/O: depression   . H/O: obesity   . Hx: UTI (urinary tract infection)   . Opioid use   . Preterm labor   . Vaginal Pap smear, abnormal    Past Surgical History:  Procedure Laterality Date  . CHOLECYSTECTOMY  2008  . HERNIA REPAIR  2012  . HIATAL HERNIA REPAIR  2012  . WISDOM TOOTH EXTRACTION     The following portions of the patient's  history were reviewed and updated as appropriate: allergies, current medications, past family history, past medical history, past social history, past surgical history and problem list.   Health Maintenance:  Normal pap and negative HRHPV on 10/2019.  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   Review of Systems:  Pertinent items noted in HPI and remainder of comprehensive ROS otherwise negative.  Physical Exam:   General:  Alert, oriented and cooperative. Patient appears to be in no acute distress.  Mental Status: Normal mood and affect. Normal behavior. Normal judgment and thought content.   Respiratory: Normal respiratory effort, no problems with respiration noted  Rest of physical exam deferred due to type of encounter  Labs and Imaging No results found for this or any previous visit (from the past 336 hour(s)). No results found.     Assessment and Plan:     1. Encounter for counseling regarding contraception Extensive discussion regarding pros and cons of device.  Will prescribe nuvaring  - etonogestrel-ethinyl estradiol (NUVARING) 0.12-0.015 MG/24HR vaginal ring; Insert vaginally and leave in place for 3 consecutive weeks, then remove for 1 week.  Dispense: 1 each; Refill: 12       I discussed the assessment and treatment plan with the patient. The patient was provided an opportunity to ask questions and all were answered. The patient agreed with the  plan and demonstrated an understanding of the instructions.   The patient was advised to call back or seek an in-person evaluation/go to the ED if the symptoms worsen or if the condition fails to improve as anticipated.  I provided 10 minutes of face-to-face time during this encounter.  Pt advised to have Annual Exam by 10/2021 along with mammogram Warden Fillers, MD Center for [facility], [facility]

## 2021-01-14 NOTE — Patient Instructions (Signed)
Etonogestrel; Ethinyl Estradiol Vaginal Ring What is this medicine? ETONOGESTREL; ETHINYL ESTRADIOL (et oh noe JES trel; ETH in il es tra DYE ole) vaginal ring is a flexible, vaginal ring used as a contraceptive (birth control method). This product combines two types of female hormones, an estrogen and a progestin. It is used to prevent ovulation and pregnancy. Each ring is effective for 1 month. This medicine may be used for other purposes; ask your health care provider or pharmacist if you have questions. COMMON BRAND NAME(S): EluRyng, NuvaRing What should I tell my health care provider before I take this medicine? They need to know if you have any of these conditions:  abnormal vaginal bleeding  blood vessel disease or blood clots  breast, cervical, endometrial, ovarian, liver, or uterine cancer  diabetes  gallbladder disease  having surgery  heart disease or recent heart attack  high blood pressure  high cholesterol or triglycerides  history of irregular heartbeat or heart valve problems  kidney disease  liver disease  migraine headaches  protein C deficiency  protein S deficiency  recently had a baby, miscarriage, or abortion  stroke  systemic lupus erythematosus (SLE)  tobacco smoker  your age is more than 43 years old  an unusual or allergic reaction to estrogens, progestins, other medicines, foods, dyes, or preservatives  pregnant or trying to get pregnant  breast-feeding How should I use this medicine? Insert the ring into your vagina as directed. Follow the directions on the prescription label. The ring will remain place for 3 weeks and is then removed for a 1-week break. A new ring is inserted 1 week after the last ring was removed, on the same day of the week. Check often to make sure the ring is still in place. If the ring was out of the vagina for an unknown amount of time, you may not be protected from pregnancy. Perform a pregnancy test and call  your doctor. Do not use more often than directed. A patient package insert for the product will be given with each prescription and refill. Read this sheet carefully each time. The sheet may change frequently. Contact your pediatrician regarding the use of this medicine in children. Special care may be needed. Overdosage: If you think you have taken too much of this medicine contact a poison control center or emergency room at once. NOTE: This medicine is only for you. Do not share this medicine with others. What if I miss a dose? You will need to use the ring exactly as directed. It is very important to follow the schedule every cycle. If you do not use the ring as directed, you may not be protected from pregnancy. If the ring should slip out, is lost, or if you leave it in longer or shorter than you should, contact your health care professional for advice. What may interact with this medicine? Do not take this medicine with the following medications:  dasabuvir; ombitasvir; paritaprevir; ritonavir  ombitasvir; paritaprevir; ritonavir  vaginal lubricants or other vaginal products that are oil-based or silicone-based This medicine may also interact with the following medications:  acetaminophen  antibiotics or medicines for infections, especially rifampin, rifabutin, rifapentine, and griseofulvin, and possibly penicillins or tetracyclines  aprepitant or fosaprepitant  armodafinil  ascorbic acid (vitamin C)  barbiturate medicines, such as phenobarbital or primidone  bosentan  certain antiviral medicines for hepatitis, HIV or AIDS  certain medicines for cancer treatment  certain medicines for seizures like carbamazepine, clobazam, felbamate, lamotrigine, oxcarbazepine, phenytoin, rufinamide,   topiramate  certain medicines for treating high cholesterol  cyclosporine  dantrolene  elagolix  flibanserin  grapefruit juice  lesinurad  medicines for diabetes  medicines to  treat fungal infections, such as griseofulvin, miconazole, fluconazole, ketoconazole, itraconazole, posaconazole or voriconazole  mifepristone  mitotane  modafinil  morphine  mycophenolate  St. John's wort  tamoxifen  temazepam  theophylline or aminophylline  thyroid hormones  tizanidine  tranexamic acid  ulipristal  warfarin This list may not describe all possible interactions. Give your health care provider a list of all the medicines, herbs, non-prescription drugs, or dietary supplements you use. Also tell them if you smoke, drink alcohol, or use illegal drugs. Some items may interact with your medicine. What should I watch for while using this medicine? Visit your doctor or health care professional for regular checks on your progress. You will need a regular breast and pelvic exam and Pap smear while on this medicine. Check with your doctor or health care professional to see if you need an additional method of contraception during the first cycle that you use this ring. Female condoms (made with natural rubber latex, polyisoprene, and polyurethane) and spermicides may be used. Do not use a diaphragm, cervical cap, or a female condom, as the ring can interfere with these birth control methods and their proper placement. If you have any reason to think you are pregnant, stop using this medicine right away and contact your doctor or health care professional. If you are using this medicine for hormone related problems, it may take several cycles of use to see improvement in your condition. Smoking increases the risk of getting a blood clot or having a stroke while you are using hormonal birth control, especially if you are more than 43 years old. You are strongly advised not to smoke. Some women are prone to getting dark patches on the skin of the face (cholasma). Your risk of getting chloasma with this medicine is higher if you had chloasma during a pregnancy. Keep out of the sun.  If you cannot avoid being in the sun, wear protective clothing and use sunscreen. Do not use sun lamps or tanning beds/booths. This medicine can make your body retain fluid, making your fingers, hands, or ankles swell. Your blood pressure can go up. Contact your doctor or health care professional if you feel you are retaining fluid. If you are going to have elective surgery, you may need to stop using this medicine before the surgery. Consult your health care professional for advice. This medicine does not protect you against HIV infection (AIDS) or any other sexually transmitted diseases. What side effects may I notice from receiving this medicine? Side effects that you should report to your doctor or health care professional as soon as possible:  allergic reactions such as skin rash or itching, hives, swelling of the lips, mouth, tongue, or throat  depression  high blood pressure  migraines or severe, sudden headaches  signs and symptoms of a blood clot such as breathing problems; changes in vision; chest pain; severe, sudden headache; pain, swelling, warmth in the leg; trouble speaking; sudden numbness or weakness of the face, arm or leg  signs and symptoms of infection like fever or chills with dizziness and a sunburn-like rash, or pain or trouble passing urine  stomach pain  symptoms of vaginal infection like itching, irritation or unusual discharge  yellowing of the eyes or skin Side effects that usually do not require medical attention (report these to your doctor or   health care professional if they continue or are bothersome):  acne  breast pain, tenderness  irregular vaginal bleeding or spotting, particularly during the first month of use  mild headache  nausea  painful periods  vomiting This list may not describe all possible side effects. Call your doctor for medical advice about side effects. You may report side effects to FDA at 1-800-FDA-1088. Where should I keep  my medicine? Keep out of the reach of children. Store unopened medicine for up to 4 months at room temperature at 15 and 30 degrees C (59 and 86 degrees F). Protect from light. Do not store above 30 degrees C (86 degrees F). Throw away any unused medicine 4 months after the dispense date or the expiration date, whichever comes first. A ring may only be used for 1 cycle (1 month). After the 3-week cycle, a used ring is removed and should be placed in the re-closable foil pouch and discarded in the trash out of reach of children and pets. Do NOT flush down the toilet. NOTE: This sheet is a summary. It may not cover all possible information. If you have questions about this medicine, talk to your doctor, pharmacist, or health care provider.  2021 Elsevier/Gold Standard (2019-10-11 20:36:29)  

## 2021-02-04 DIAGNOSIS — Z419 Encounter for procedure for purposes other than remedying health state, unspecified: Secondary | ICD-10-CM | POA: Diagnosis not present

## 2021-02-04 DIAGNOSIS — F419 Anxiety disorder, unspecified: Secondary | ICD-10-CM | POA: Diagnosis not present

## 2021-02-07 DIAGNOSIS — Z6841 Body Mass Index (BMI) 40.0 and over, adult: Secondary | ICD-10-CM | POA: Diagnosis not present

## 2021-02-14 DIAGNOSIS — J3081 Allergic rhinitis due to animal (cat) (dog) hair and dander: Secondary | ICD-10-CM | POA: Diagnosis not present

## 2021-02-14 DIAGNOSIS — J309 Allergic rhinitis, unspecified: Secondary | ICD-10-CM | POA: Diagnosis not present

## 2021-02-14 DIAGNOSIS — G8929 Other chronic pain: Secondary | ICD-10-CM | POA: Diagnosis not present

## 2021-02-14 DIAGNOSIS — M79661 Pain in right lower leg: Secondary | ICD-10-CM | POA: Diagnosis not present

## 2021-02-14 DIAGNOSIS — J301 Allergic rhinitis due to pollen: Secondary | ICD-10-CM | POA: Diagnosis not present

## 2021-02-14 DIAGNOSIS — G894 Chronic pain syndrome: Secondary | ICD-10-CM | POA: Diagnosis not present

## 2021-02-14 DIAGNOSIS — M25551 Pain in right hip: Secondary | ICD-10-CM | POA: Diagnosis not present

## 2021-02-14 DIAGNOSIS — Z79891 Long term (current) use of opiate analgesic: Secondary | ICD-10-CM | POA: Diagnosis not present

## 2021-02-14 DIAGNOSIS — M79662 Pain in left lower leg: Secondary | ICD-10-CM | POA: Diagnosis not present

## 2021-02-14 DIAGNOSIS — M25552 Pain in left hip: Secondary | ICD-10-CM | POA: Diagnosis not present

## 2021-02-14 DIAGNOSIS — M545 Low back pain, unspecified: Secondary | ICD-10-CM | POA: Diagnosis not present

## 2021-02-19 DIAGNOSIS — N951 Menopausal and female climacteric states: Secondary | ICD-10-CM | POA: Diagnosis not present

## 2021-02-19 DIAGNOSIS — E559 Vitamin D deficiency, unspecified: Secondary | ICD-10-CM | POA: Diagnosis not present

## 2021-02-19 DIAGNOSIS — R5383 Other fatigue: Secondary | ICD-10-CM | POA: Diagnosis not present

## 2021-03-06 DIAGNOSIS — G8929 Other chronic pain: Secondary | ICD-10-CM | POA: Insufficient documentation

## 2021-03-06 DIAGNOSIS — Z9889 Other specified postprocedural states: Secondary | ICD-10-CM | POA: Diagnosis not present

## 2021-03-06 DIAGNOSIS — M545 Low back pain, unspecified: Secondary | ICD-10-CM | POA: Diagnosis not present

## 2021-03-07 DIAGNOSIS — Z419 Encounter for procedure for purposes other than remedying health state, unspecified: Secondary | ICD-10-CM | POA: Diagnosis not present

## 2021-03-14 DIAGNOSIS — J309 Allergic rhinitis, unspecified: Secondary | ICD-10-CM | POA: Diagnosis not present

## 2021-03-14 DIAGNOSIS — J3089 Other allergic rhinitis: Secondary | ICD-10-CM | POA: Diagnosis not present

## 2021-03-14 DIAGNOSIS — M25552 Pain in left hip: Secondary | ICD-10-CM | POA: Diagnosis not present

## 2021-03-14 DIAGNOSIS — M545 Low back pain, unspecified: Secondary | ICD-10-CM | POA: Diagnosis not present

## 2021-03-14 DIAGNOSIS — G894 Chronic pain syndrome: Secondary | ICD-10-CM | POA: Diagnosis not present

## 2021-03-14 DIAGNOSIS — M79661 Pain in right lower leg: Secondary | ICD-10-CM | POA: Diagnosis not present

## 2021-03-14 DIAGNOSIS — J301 Allergic rhinitis due to pollen: Secondary | ICD-10-CM | POA: Diagnosis not present

## 2021-03-14 DIAGNOSIS — M79662 Pain in left lower leg: Secondary | ICD-10-CM | POA: Diagnosis not present

## 2021-03-14 DIAGNOSIS — M25551 Pain in right hip: Secondary | ICD-10-CM | POA: Diagnosis not present

## 2021-03-14 DIAGNOSIS — J3081 Allergic rhinitis due to animal (cat) (dog) hair and dander: Secondary | ICD-10-CM | POA: Diagnosis not present

## 2021-03-20 ENCOUNTER — Ambulatory Visit: Payer: Medicaid Other | Admitting: Advanced Practice Midwife

## 2021-03-31 ENCOUNTER — Ambulatory Visit: Payer: Medicaid Other | Admitting: Obstetrics

## 2021-04-02 ENCOUNTER — Encounter: Payer: Self-pay | Admitting: Women's Health

## 2021-04-02 ENCOUNTER — Ambulatory Visit (INDEPENDENT_AMBULATORY_CARE_PROVIDER_SITE_OTHER): Payer: Medicaid Other | Admitting: Women's Health

## 2021-04-02 ENCOUNTER — Other Ambulatory Visit (HOSPITAL_COMMUNITY)
Admission: RE | Admit: 2021-04-02 | Discharge: 2021-04-02 | Disposition: A | Discharge status: Home / Self Care | Payer: Medicaid Other | Source: Ambulatory Visit | Attending: Advanced Practice Midwife | Admitting: Advanced Practice Midwife

## 2021-04-02 ENCOUNTER — Other Ambulatory Visit: Payer: Self-pay

## 2021-04-02 VITALS — BP 106/75 | HR 103 | Ht 66.5 in | Wt 276.0 lb

## 2021-04-02 DIAGNOSIS — Z30013 Encounter for initial prescription of injectable contraceptive: Secondary | ICD-10-CM

## 2021-04-02 DIAGNOSIS — O039 Complete or unspecified spontaneous abortion without complication: Secondary | ICD-10-CM

## 2021-04-02 DIAGNOSIS — Z3009 Encounter for other general counseling and advice on contraception: Secondary | ICD-10-CM | POA: Diagnosis not present

## 2021-04-02 DIAGNOSIS — O021 Missed abortion: Secondary | ICD-10-CM | POA: Diagnosis not present

## 2021-04-02 LAB — POCT URINE PREGNANCY: Preg Test, Ur: NEGATIVE

## 2021-04-02 MED ORDER — MEDROXYPROGESTERONE ACETATE 150 MG/ML IM SUSP
150.0000 mg | Freq: Once | INTRAMUSCULAR | Status: AC
  Administered 2021-04-02: 150 mg via INTRAMUSCULAR

## 2021-04-02 NOTE — Progress Notes (Signed)
  History:  [name] is a 45 y.o. [id number] who presents to clinic today for IUD insertion, however, patient reports a miscarriage at home that she did not get any follow-up for, so will evaluate today to determine completeness of miscarriage with quantitative hCG. Patient is also unsure if she would like the IUD with or without hormones, so will discuss options today. Patient reports miscarriage bleeding "plus two periods" and shows provider photo of positive pregnancy test on 03/08. Patient denies any pain or bleeding today.  Patient reports she is getting bariatric surgery and her surgeon advised her to get either an IUD or Nexplanon. Patient is currently in process for the gastric bypass surgery and does not have a date set at this time. Patient was seen 01/14/2021 and received a prescription for NuvaRing which she mentioned in that visit she had used in the past with success, but now states that it does not work for her because she got pregnant on NuvaRing in March and had a positive pregnancy test at home. Patient has continued to use NuvaRing and took it out today immediately before her appointment. Patient has also used Depo in the past, which she liked, but reports having 15 day long, light periods each month, which she would like to avoid. UPT negative today.   The following portions of the patient's history were reviewed and updated as appropriate: allergies, current medications, family history, past medical history, social history, past surgical history and problem list.  Review of Systems:  Review of Systems  Genitourinary:       No bleeding, no pelvic pain.     Objective:  Physical Exam BP 106/75   Pulse (!) 103   Ht 5' 6.5" (1.689 m)   Wt 276 lb (125.2 kg)   LMP 03/21/2021 (Exact Date)   BMI 43.88 kg/m    Physical Exam Vitals and nursing note reviewed.  Constitutional:      General: She is not in acute distress.    Appearance: Normal appearance. She is not  ill-appearing, toxic-appearing or diaphoretic.  HENT:     Head: Normocephalic and atraumatic.  Pulmonary:     Effort: Pulmonary effort is normal.  Neurological:     Mental Status: She is alert and oriented to person, place, and time.  Psychiatric:        Mood and Affect: Mood normal.        Behavior: Behavior normal.        Thought Content: Thought content normal.        Judgment: Judgment normal.    Labs and Imaging No results found for this or any previous visit (from the past 24 hour(s)).  No results found.   Assessment & Plan:  1. Miscarriage - POCT urine pregnancy - Urine cytology ancillary only([place]) - Beta hCG quant (ref lab)  2. Encounter for counseling regarding contraception -pt advised need for follow-up on miscarriage with hCG prior to insertion of IUD -pt will consider hormonal IUD vs. Paragard, believes she would like Paragard -pt advised she can have Depo today, pt agreeable to plan -pt to schedule future visit for IUD insertion, as desired  3. Encounter for initial prescription of injectable contraceptive - medroxyPROGESTERone (DEPO-PROVERA) injection 150 mg  Approximately 10 minutes of total time was spent with this patient on counseling.  Marylen Ponto, NP 04/09/2021 11:42 AM

## 2021-04-02 NOTE — Patient Instructions (Signed)
Intrauterine Device Information An intrauterine device (IUD) is a medical device that is inserted into the uterus to prevent pregnancy. It is a small, T-shaped device that has one or two nylon strings hanging down from it. The strings hang out of the lower part of the uterus (cervix) to allow for future IUD removal. There are two types of IUDs:  Hormone IUD. This type of IUD is made of plastic and contains the hormone progestin (synthetic progesterone). A hormone IUD may last 3-5 years.  Copper IUD. This type of IUD has copper wire wrapped around it. A copper IUD may last up to 10 years. How is an IUD inserted? An IUD is inserted through the vagina, through the cervix, and into the uterus with a minor medical procedure. The procedure for IUD insertion may vary among health care providers and hospitals. How does an IUD work? Synthetic progesterone in a hormonal IUD prevents pregnancy by:  Thickening cervical mucus to prevent sperm from entering the uterus.  Thinning the uterine lining to prevent a fertilized egg from being implanted there. Copper in a copper IUD prevents pregnancy by making the uterus and fallopian tubes produce a fluid that kills sperm. What are the advantages of an IUD? Advantages of either type of IUD An IUD:  Is highly effective in preventing pregnancy.  Is reversible. You can become pregnant shortly after the IUD is removed.  Is low-maintenance and can stay in place for a long time.  Has no estrogen-related side effects.  Can be used when breastfeeding.  Is not associated with weight gain.  Can be inserted right after childbirth, an abortion, or a miscarriage. Advantages of a hormone IUD  If it is inserted within 7 days of your period starting, it works right after it has been inserted. If the hormone IUD is inserted at any other time in your cycle, you will need to use a backup method of birth control for 7 days after insertion.  It can make menstrual periods  lighter or stop completely.  It can reduce menstrual cramping and other discomforts from menstrual periods.  It can be used for 3-5 years, depending on which IUD you have. Advantages of a copper IUD  It works right after it is inserted.  It can be used as a form of emergency birth control if it is inserted within 5 days after having unprotected sex.  It does not interfere with your body's natural hormones.  It can be used for up to 10 years. What are the disadvantages of an IUD?  An IUD may cause irregular menstrual bleeding for a period of time after insertion.  It is common to have pain during insertion and have cramping and vaginal bleeding after insertion.  An IUD may cut the uterus (uterine perforation) when it is inserted. This is rare.  Pelvic inflammatory disease (PID) may happen after insertion of an IUD. PID is an infection in the uterus and fallopian tubes. The IUD does not cause the infection. The infection is usually from an unknown sexually transmitted infection (STI). This is rare, and it usually happens during the first 20 days after the IUD is inserted.  A copper IUD can make your menstrual flow heavier and more painful.  IUDs cannot prevent sexually transmitted infections (STIs). How is an IUD removed?   You will lie on your back with your knees bent and your feet in footrests (stirrups).  A device will be inserted into your vagina to spread apart the vaginal walls (  speculum). This will allow your health care provider to see the strings attached to the IUD.  Your health care provider will use a small instrument (forceps) to grasp the IUD strings and will pull firmly until the IUD is removed. You may have some discomfort when the IUD is removed. Your health care provider may recommend taking over-the-counter pain relievers, such as ibuprofen, before the procedure. You may also have minor spotting for a few days after the procedure. The procedure for IUD removal may  vary among health care providers and hospitals. Is an IUD right for me? If you are interested in an IUD, discuss it with your health care provider. He or she will make sure you are a good candidate for an IUD and will let you know more about the advantages, disadvantage, and possible side effects. This will allow you to make a decision about the device. Summary  An intrauterine device (IUD) is a medical device that is inserted in the uterus to prevent pregnancy. It is a small, T-shaped device that has one or two nylon strings hanging down from it.  A hormone IUD contains the hormone progestin (synthetic progesterone). A copper IUD has copper wire wrapped around it.  Synthetic progesterone in a hormone IUD prevents pregnancy by thickening cervical mucus and thinning the walls of the uterus. Copper in a copper IUD prevents pregnancy by making the uterus and fallopian tubes produce a fluid that kills sperm.  A hormone IUD can be left in place for 3-5 years. A copper IUD can be left in place for up to 10 years.  An IUD is inserted and removed by a health care provider. You may feel some pain during insertion and removal. Your health care provider may recommend taking over-the-counter pain medicine, such as ibuprofen, before an IUD procedure. This information is not intended to replace advice given to you by your health care provider. Make sure you discuss any questions you have with your health care provider. Document Revised: 06/05/2020 Document Reviewed: 06/05/2020 Elsevier Patient Education  2021 [employer]        Intrauterine Device Insertion An intrauterine device (IUD) is a medical device that is inserted into the uterus to prevent pregnancy. It is a small, T-shaped device that has one or two nylon strings hanging down from it. The strings hang out of the lower part of the uterus (cervix) to allow for future IUD removal. There are two types of IUDs:  Hormone IUD. This type of IUD is  made of plastic and contains the hormone progestin (synthetic progesterone). A hormone IUD may last 3-5 years, depending on which one you have. Synthetic progesterone prevents pregnancy by: ? Thickening cervical mucus to prevent sperm from entering the uterus. ? Thinning the uterine lining to prevent a fertilized egg from implanting there.  Copper IUD. This type of IUD has copper wire wrapped around it. A copper IUD may last up to 10 years. Copper prevents pregnancy by making the uterus and fallopian tubes produce a fluid that kills sperm. Tell a health care provider about:  Any allergies you have.  All medicines you are taking, including vitamins, herbs, eye drops, creams, and over-the-counter medicines.  Any surgeries you have had.  Any medical conditions you have, including any sexually transmitted infections (STIs) you may have.  Whether you are pregnant or may be pregnant. What are the risks? Generally, this is a safe procedure. However, problems may occur, including:  Infection.  Bleeding.  Allergic reactions to medicines.  Puncture (perforation) of the uterus or damage to other structures or organs.  Accidental placement of the IUD either in the muscle layer of the uterus (myometrium) or outside the uterus.  The IUD falling out of the uterus (expulsion). This is more common among women who have recently had a child.  Higher risk of an egg being fertilized outside your uterus (ectopic pregnancy).This is rare.  Pelvic inflammatory disease (PID), which is an infection in the uterus and fallopian tubes. The IUD does not cause the infection. The infection is usually from an unknown sexually transmitted infection (STI). This is rare, and it usually happens during the first 20 days after the IUD is inserted. What happens before the procedure?  Ask your health care provider about: ? Changing or stopping your regular medicines. This is especially important if you are taking  diabetes medicines or blood thinners. ? Taking over-the-counter medicines, vitamins, herbs, and supplements.  Talk with your health care provider about when to schedule your IUD placement.  Your health care provider may recommend taking over-the-counter pain medicines before the procedure. These medicines include ibuprofen and naproxen.  You may have tests for: ? Pregnancy. A pregnancy test involves having a urine or blood sample taken. ? Sexually transmitted infections (STIs). Placing an IUD in someone who has an STI can make the infection worse. ? Cervical cancer. You may have a Pap test to check for this type of cancer. This means collecting cells from your cervix to be checked under a microscope.  You may have a physical exam to determine the size and position of your uterus. What happens during the procedure?  A tool (speculum) will be placed in your vagina and widened so that your health care provider can see your cervix.  Medicine, or antiseptic, may be applied to your cervix to help lower your risk of infection.  You may be given an anesthetic medicine to numb each side of your cervix. This medicine is usually given by an injection into the cervix.  A tool called a uterine sound will be inserted into your uterus to check the length of your uterus and the direction that your uterus may be tilted.  A slim instrument or tube (IUD inserter) that holds the IUD will be inserted into your vagina, through your cervical canal, and into your uterus.  The IUD will be placed in the uterus, and the IUD inserter will be removed.  The strings that are attached to the IUD will be trimmed so that they lie just below the cervix.  The speculum will be removed. The procedure may vary among health care providers and hospitals. What can I expect after procedure?  You may have bleeding after the procedure. This is normal. It varies from light bleeding (spotting) for a few days to menstrual-like  bleeding.  You may have cramping and pain in the abdomen.  You may feel dizzy or light-headed.  You may have lower back pain.  You may have headaches and nausea. Follow these instructions at home:  Before resuming sexual activity, check to make sure that you can feel the IUD string or strings. You should be able to feel the end of the string below the opening of your cervix. If your IUD string is in place, you may resume sexual activity. ? If you had a hormonal IUD inserted more than 7 days after your most recent period started, you will need to use a backup method of birth control for 7 days after IUD  insertion. Ask your health care provider whether this applies to you.  Continue to check that the IUD is still in place by feeling for the strings after every menstrual period, or once a month.  An IUD will not protect you from sexually transmitted infections (STIs). Use methods to prevent the exchange of body fluids between partners (barrier protection) every time you have sex. Barrier protection can be used during oral, vaginal, or anal sex. Commonly used barrier methods include: ? Female condom. ? Female condom. ? Dental dam.  Take over-the-counter and prescription medicines only as told by your health care provider.  Keep all follow-up visits. This is important. Contact a health care provider if:  You feel light-headed or weak.  You have any of the following problems with your IUD string or strings: ? The string bothers or hurts you or your sexual partner. ? You cannot feel the string. ? The string has gotten longer.  You can feel the IUD in your vagina.  You think you may be pregnant, or you miss your menstrual period.  You think you may have a sexually transmitted infection (STI). Get help right away if you:  You have flu-like symptoms, such as tiredness (fatigue) and muscle aches.  You have a fever and chills.  You have bleeding that is heavier or lasts longer than a  normal menstrual cycle.  You have abnormal or bad-smelling discharge from your vagina.  You develop abdominal pain that is new, is getting worse, or is not in the same area of earlier cramping and pain.  You have pain during sexual activity. Summary  An intrauterine device (IUD) is a small, T-shaped device that has one or two nylon strings hanging down from it. You may have a copper IUD or a hormone IUD.  Ask your health care provider what you need to do before the procedure. You may have some tests and you may have to change or stop some medicines.  You may have bleeding after the procedure. This is normal. It varies from light spotting for a few days to menstrual-like bleeding.  Check to make sure that you can feel the IUD strings before you resume sexual activity. Check the strings after every menstrual period or once a month.  An IUD does not protect against STIs. Use other methods to protect yourself against infections. This information is not intended to replace advice given to you by your health care provider. Make sure you discuss any questions you have with your health care provider. Document Revised: 06/05/2020 Document Reviewed: 06/05/2020 Elsevier Patient Education  2021 [employer].

## 2021-04-02 NOTE — Progress Notes (Addendum)
GYN presents for IUD Insert, she recently had a miscarriage.  UPT today is Negative.  Last PAP 11/06/2019  Ok to give DEPO Injection per Provider. Office supplied Depo Injection given in [facility], tolerated well.  Next DEPO Injection due July 13-27, 2022.  Patient will like to switch to IUD Paragard.  Administrations This Visit    medroxyPROGESTERone (DEPO-PROVERA) injection 150 mg    Admin Date 04/02/2021 Action Given Dose 150 mg Route Intramuscular Administered By Maretta Bees, RMA

## 2021-04-03 LAB — URINE CYTOLOGY ANCILLARY ONLY
Chlamydia: NEGATIVE
Comment: NEGATIVE
Comment: NORMAL
Neisseria Gonorrhea: NEGATIVE

## 2021-04-03 LAB — BETA HCG QUANT (REF LAB): hCG Quant: <1 m[IU]/mL

## 2021-04-06 DIAGNOSIS — Z419 Encounter for procedure for purposes other than remedying health state, unspecified: Secondary | ICD-10-CM | POA: Diagnosis not present

## 2021-04-09 DIAGNOSIS — Z01818 Encounter for other preprocedural examination: Secondary | ICD-10-CM | POA: Diagnosis not present

## 2021-04-09 DIAGNOSIS — G4733 Obstructive sleep apnea (adult) (pediatric): Secondary | ICD-10-CM | POA: Diagnosis not present

## 2021-04-09 DIAGNOSIS — Z6841 Body Mass Index (BMI) 40.0 and over, adult: Secondary | ICD-10-CM | POA: Diagnosis not present

## 2021-04-09 DIAGNOSIS — Z79899 Other long term (current) drug therapy: Secondary | ICD-10-CM | POA: Diagnosis not present

## 2021-04-09 DIAGNOSIS — I10 Essential (primary) hypertension: Secondary | ICD-10-CM | POA: Diagnosis not present

## 2021-04-09 DIAGNOSIS — K2950 Unspecified chronic gastritis without bleeding: Secondary | ICD-10-CM | POA: Diagnosis not present

## 2021-04-09 DIAGNOSIS — R7303 Prediabetes: Secondary | ICD-10-CM | POA: Diagnosis not present

## 2021-04-09 DIAGNOSIS — Z886 Allergy status to analgesic agent status: Secondary | ICD-10-CM | POA: Diagnosis not present

## 2021-04-09 DIAGNOSIS — Z885 Allergy status to narcotic agent status: Secondary | ICD-10-CM | POA: Diagnosis not present

## 2021-04-09 DIAGNOSIS — Z888 Allergy status to other drugs, medicaments and biological substances status: Secondary | ICD-10-CM | POA: Diagnosis not present

## 2021-04-09 DIAGNOSIS — E785 Hyperlipidemia, unspecified: Secondary | ICD-10-CM | POA: Diagnosis not present

## 2021-04-09 DIAGNOSIS — K219 Gastro-esophageal reflux disease without esophagitis: Secondary | ICD-10-CM | POA: Diagnosis not present

## 2021-04-14 DIAGNOSIS — G4710 Hypersomnia, unspecified: Secondary | ICD-10-CM | POA: Diagnosis not present

## 2021-04-14 DIAGNOSIS — R0683 Snoring: Secondary | ICD-10-CM | POA: Diagnosis not present

## 2021-04-14 DIAGNOSIS — Z6841 Body Mass Index (BMI) 40.0 and over, adult: Secondary | ICD-10-CM | POA: Diagnosis not present

## 2021-04-21 DIAGNOSIS — M545 Low back pain, unspecified: Secondary | ICD-10-CM | POA: Diagnosis not present

## 2021-04-21 DIAGNOSIS — J3089 Other allergic rhinitis: Secondary | ICD-10-CM | POA: Diagnosis not present

## 2021-04-21 DIAGNOSIS — Z723 Lack of physical exercise: Secondary | ICD-10-CM | POA: Diagnosis not present

## 2021-04-21 DIAGNOSIS — M79662 Pain in left lower leg: Secondary | ICD-10-CM | POA: Diagnosis not present

## 2021-04-21 DIAGNOSIS — G894 Chronic pain syndrome: Secondary | ICD-10-CM | POA: Diagnosis not present

## 2021-04-21 DIAGNOSIS — J3081 Allergic rhinitis due to animal (cat) (dog) hair and dander: Secondary | ICD-10-CM | POA: Diagnosis not present

## 2021-04-21 DIAGNOSIS — J301 Allergic rhinitis due to pollen: Secondary | ICD-10-CM | POA: Diagnosis not present

## 2021-04-21 DIAGNOSIS — M25551 Pain in right hip: Secondary | ICD-10-CM | POA: Diagnosis not present

## 2021-04-21 DIAGNOSIS — M79661 Pain in right lower leg: Secondary | ICD-10-CM | POA: Diagnosis not present

## 2021-04-21 DIAGNOSIS — J309 Allergic rhinitis, unspecified: Secondary | ICD-10-CM | POA: Diagnosis not present

## 2021-04-21 DIAGNOSIS — M25552 Pain in left hip: Secondary | ICD-10-CM | POA: Diagnosis not present

## 2021-04-30 DIAGNOSIS — L209 Atopic dermatitis, unspecified: Secondary | ICD-10-CM | POA: Diagnosis not present

## 2021-04-30 DIAGNOSIS — F331 Major depressive disorder, recurrent, moderate: Secondary | ICD-10-CM | POA: Diagnosis not present

## 2021-04-30 DIAGNOSIS — J302 Other seasonal allergic rhinitis: Secondary | ICD-10-CM | POA: Diagnosis not present

## 2021-04-30 DIAGNOSIS — Z6841 Body Mass Index (BMI) 40.0 and over, adult: Secondary | ICD-10-CM | POA: Diagnosis not present

## 2021-04-30 DIAGNOSIS — Z309 Encounter for contraceptive management, unspecified: Secondary | ICD-10-CM | POA: Diagnosis not present

## 2021-04-30 DIAGNOSIS — R7303 Prediabetes: Secondary | ICD-10-CM | POA: Diagnosis not present

## 2021-05-07 DIAGNOSIS — Z6841 Body Mass Index (BMI) 40.0 and over, adult: Secondary | ICD-10-CM | POA: Diagnosis not present

## 2021-05-07 DIAGNOSIS — E559 Vitamin D deficiency, unspecified: Secondary | ICD-10-CM | POA: Diagnosis not present

## 2021-05-07 DIAGNOSIS — Z419 Encounter for procedure for purposes other than remedying health state, unspecified: Secondary | ICD-10-CM | POA: Diagnosis not present

## 2021-05-07 DIAGNOSIS — Z9889 Other specified postprocedural states: Secondary | ICD-10-CM | POA: Diagnosis not present

## 2021-05-07 DIAGNOSIS — M545 Low back pain, unspecified: Secondary | ICD-10-CM | POA: Diagnosis not present

## 2021-05-07 DIAGNOSIS — Z136 Encounter for screening for cardiovascular disorders: Secondary | ICD-10-CM | POA: Diagnosis not present

## 2021-05-07 DIAGNOSIS — G8929 Other chronic pain: Secondary | ICD-10-CM | POA: Diagnosis not present

## 2021-05-07 DIAGNOSIS — R7303 Prediabetes: Secondary | ICD-10-CM | POA: Diagnosis not present

## 2021-05-12 DIAGNOSIS — Z6841 Body Mass Index (BMI) 40.0 and over, adult: Secondary | ICD-10-CM | POA: Diagnosis not present

## 2021-05-12 DIAGNOSIS — Z7189 Other specified counseling: Secondary | ICD-10-CM | POA: Diagnosis not present

## 2021-05-12 DIAGNOSIS — F54 Psychological and behavioral factors associated with disorders or diseases classified elsewhere: Secondary | ICD-10-CM | POA: Diagnosis not present

## 2021-05-19 ENCOUNTER — Other Ambulatory Visit: Payer: Self-pay

## 2021-05-19 ENCOUNTER — Encounter: Payer: Self-pay | Admitting: Advanced Practice Midwife

## 2021-05-19 ENCOUNTER — Ambulatory Visit (INDEPENDENT_AMBULATORY_CARE_PROVIDER_SITE_OTHER): Payer: Medicaid Other | Admitting: Advanced Practice Midwife

## 2021-05-19 VITALS — BP 125/87 | HR 114 | Wt 276.0 lb

## 2021-05-19 DIAGNOSIS — N816 Rectocele: Secondary | ICD-10-CM | POA: Insufficient documentation

## 2021-05-19 DIAGNOSIS — N8110 Cystocele, unspecified: Secondary | ICD-10-CM

## 2021-05-19 DIAGNOSIS — Z30430 Encounter for insertion of intrauterine contraceptive device: Secondary | ICD-10-CM

## 2021-05-19 MED ORDER — PARAGARD INTRAUTERINE COPPER IU IUD
INTRAUTERINE_SYSTEM | Freq: Once | INTRAUTERINE | Status: AC
  Administered 2021-05-19: 1 via INTRAUTERINE

## 2021-05-19 NOTE — Progress Notes (Signed)
GYNECOLOGY OFFICE PROCEDURE NOTE  Shelly Lane is a 45 y.o. [id number] here for Lile[place] IUD insertion. No GYN concerns.  Last pap smear was on 11/06/2019 and was normal.  IUD Insertion Procedure Note Patient identified, informed consent performed, consent signed.   Discussed risks of irregular bleeding, cramping, infection, malpositioning or misplacement of the IUD outside the uterus which may require further procedure such as laparoscopy. Time out was performed.  Urine pregnancy test negative.  Speculum placed in the vagina.  Cervix visualized.  Cleaned with Betadine x 2.  Grasped anteriorly with a single tooth tenaculum.  Uterus sounded to 9 cm. Paragard IUD placed per manufacturer's recommendations.  Strings trimmed to 3-4 cm. Tenaculum was removed, good hemostasis noted.  Patient tolerated procedure well.   Patient was given post-procedure instructions.  She was advised to have backup contraception for 3-4 days.  Patient was also asked to check IUD strings periodically and follow up in 4 weeks for IUD check.   Pt expressed concerns about vaginal prolapse and does have mild cystocele and rectocele on today's exam. String check with MD to review options.

## 2021-05-19 NOTE — Patient Instructions (Signed)

## 2021-05-21 DIAGNOSIS — Z713 Dietary counseling and surveillance: Secondary | ICD-10-CM | POA: Diagnosis not present

## 2021-05-23 DIAGNOSIS — J3081 Allergic rhinitis due to animal (cat) (dog) hair and dander: Secondary | ICD-10-CM | POA: Diagnosis not present

## 2021-05-23 DIAGNOSIS — G894 Chronic pain syndrome: Secondary | ICD-10-CM | POA: Diagnosis not present

## 2021-05-23 DIAGNOSIS — M79661 Pain in right lower leg: Secondary | ICD-10-CM | POA: Diagnosis not present

## 2021-05-23 DIAGNOSIS — J301 Allergic rhinitis due to pollen: Secondary | ICD-10-CM | POA: Diagnosis not present

## 2021-05-23 DIAGNOSIS — M25552 Pain in left hip: Secondary | ICD-10-CM | POA: Diagnosis not present

## 2021-05-23 DIAGNOSIS — M79662 Pain in left lower leg: Secondary | ICD-10-CM | POA: Diagnosis not present

## 2021-05-23 DIAGNOSIS — M25551 Pain in right hip: Secondary | ICD-10-CM | POA: Diagnosis not present

## 2021-05-23 DIAGNOSIS — M545 Low back pain, unspecified: Secondary | ICD-10-CM | POA: Diagnosis not present

## 2021-05-23 DIAGNOSIS — J3089 Other allergic rhinitis: Secondary | ICD-10-CM | POA: Diagnosis not present

## 2021-05-23 DIAGNOSIS — J309 Allergic rhinitis, unspecified: Secondary | ICD-10-CM | POA: Diagnosis not present

## 2021-05-26 DIAGNOSIS — G4713 Recurrent hypersomnia: Secondary | ICD-10-CM | POA: Diagnosis not present

## 2021-05-27 DIAGNOSIS — R0683 Snoring: Secondary | ICD-10-CM | POA: Diagnosis not present

## 2021-05-27 DIAGNOSIS — Z6841 Body Mass Index (BMI) 40.0 and over, adult: Secondary | ICD-10-CM | POA: Diagnosis not present

## 2021-05-27 DIAGNOSIS — G4710 Hypersomnia, unspecified: Secondary | ICD-10-CM | POA: Diagnosis not present

## 2021-06-06 DIAGNOSIS — Z6841 Body Mass Index (BMI) 40.0 and over, adult: Secondary | ICD-10-CM | POA: Diagnosis not present

## 2021-06-06 DIAGNOSIS — E559 Vitamin D deficiency, unspecified: Secondary | ICD-10-CM | POA: Diagnosis not present

## 2021-06-06 DIAGNOSIS — Z419 Encounter for procedure for purposes other than remedying health state, unspecified: Secondary | ICD-10-CM | POA: Diagnosis not present

## 2021-06-06 DIAGNOSIS — R7303 Prediabetes: Secondary | ICD-10-CM | POA: Diagnosis not present

## 2021-06-12 DIAGNOSIS — J302 Other seasonal allergic rhinitis: Secondary | ICD-10-CM | POA: Diagnosis not present

## 2021-06-12 DIAGNOSIS — R7303 Prediabetes: Secondary | ICD-10-CM | POA: Diagnosis not present

## 2021-06-12 DIAGNOSIS — Z6841 Body Mass Index (BMI) 40.0 and over, adult: Secondary | ICD-10-CM | POA: Diagnosis not present

## 2021-06-12 DIAGNOSIS — Z309 Encounter for contraceptive management, unspecified: Secondary | ICD-10-CM | POA: Diagnosis not present

## 2021-06-16 ENCOUNTER — Encounter: Payer: Self-pay | Admitting: Obstetrics and Gynecology

## 2021-06-16 ENCOUNTER — Ambulatory Visit (INDEPENDENT_AMBULATORY_CARE_PROVIDER_SITE_OTHER): Payer: Medicaid Other | Admitting: Obstetrics and Gynecology

## 2021-06-16 ENCOUNTER — Other Ambulatory Visit: Payer: Self-pay

## 2021-06-16 VITALS — BP 115/80 | HR 112 | Ht 66.5 in | Wt 272.1 lb

## 2021-06-16 DIAGNOSIS — Z30431 Encounter for routine checking of intrauterine contraceptive device: Secondary | ICD-10-CM | POA: Diagnosis not present

## 2021-06-16 DIAGNOSIS — N8110 Cystocele, unspecified: Secondary | ICD-10-CM

## 2021-06-16 DIAGNOSIS — N8111 Cystocele, midline: Secondary | ICD-10-CM | POA: Diagnosis not present

## 2021-06-16 DIAGNOSIS — Z1231 Encounter for screening mammogram for malignant neoplasm of breast: Secondary | ICD-10-CM

## 2021-06-16 NOTE — Progress Notes (Signed)
45 yo P11 s/p IUD insertion on 05/19/21 here for string check. Patient also with small rectocele and cystocele requesting evaluation. Patient denies any pelvic pain or dyspareunia since IUD insertion. She denies urinary incontinence or constipation.  Past Medical History:  Diagnosis Date   Abnormal Pap smear    Colpo;Last pap 2012, ASCUS w/ negative HPV   Advanced maternal age in multigravida 02/13/2013   Anemia    during preg   Anxiety    Diabetes mellitus during pregnancy, antepartum 12/15/2019   Eczema    GERD (gastroesophageal reflux disease)    Gestational diabetes    diet controlled   Gestational hypertension 04/30/2020   H/O migraine    H/O rubella    H/O varicella    H/O: depression    H/O: obesity    Hx: UTI (urinary tract infection)    Opioid use    Preterm labor    Vaginal Pap smear, abnormal    Past Surgical History:  Procedure Laterality Date   CHOLECYSTECTOMY  2008   HERNIA REPAIR  2012   HIATAL HERNIA REPAIR  2012   WISDOM TOOTH EXTRACTION     Family History  Problem Relation Age of Onset   Diabetes Maternal Grandmother    Hypertension Maternal Grandmother    Anemia Mother    Social History   Tobacco Use   Smoking status: Never   Smokeless tobacco: Never  Vaping Use   Vaping Use: Never used  Substance Use Topics   Alcohol use: No   Drug use: Yes    Types: Oxycodone    Comment: per pt prescribed by physician for chronic back pain   ROS See pertinent in HPI. All other systems reviewed and non contributory Blood pressure 115/80, pulse (!) 112, height 5' 6.5" (1.689 m), weight 272 lb 1.6 oz (123.4 kg), unknown if currently breastfeeding.  GENERAL: Well-developed, well-nourished female in no acute distress.  PELVIC: Normal external female genitalia. Vagina is pink and rugated.  Normal discharge. Normal appearing cervix. Uterus is normal in size. Small cystocele and rectocele No adnexal mass or tenderness. EXTREMITIES: No cyanosis, clubbing, or edema, 2+  distal pulses.   A/P 45 yo here for IUD check - IUD appears to be in the appropriate location - Patient referred to urogynecology for evaluation of cystocele - Screening mammogram ordered - patient with normal pap smear 10/2019

## 2021-06-17 DIAGNOSIS — M545 Low back pain, unspecified: Secondary | ICD-10-CM | POA: Diagnosis not present

## 2021-06-17 DIAGNOSIS — E559 Vitamin D deficiency, unspecified: Secondary | ICD-10-CM | POA: Diagnosis not present

## 2021-06-17 DIAGNOSIS — Z9889 Other specified postprocedural states: Secondary | ICD-10-CM | POA: Diagnosis not present

## 2021-06-17 DIAGNOSIS — R7303 Prediabetes: Secondary | ICD-10-CM | POA: Diagnosis not present

## 2021-06-17 DIAGNOSIS — G8929 Other chronic pain: Secondary | ICD-10-CM | POA: Diagnosis not present

## 2021-06-18 ENCOUNTER — Ambulatory Visit (HOSPITAL_BASED_OUTPATIENT_CLINIC_OR_DEPARTMENT_OTHER)
Admission: RE | Admit: 2021-06-18 | Discharge: 2021-06-18 | Disposition: A | Discharge status: Home / Self Care | Payer: Medicaid Other | Source: Ambulatory Visit | Attending: Obstetrics and Gynecology | Admitting: Obstetrics and Gynecology

## 2021-06-18 ENCOUNTER — Other Ambulatory Visit: Payer: Self-pay

## 2021-06-18 ENCOUNTER — Telehealth: Payer: Self-pay

## 2021-06-18 DIAGNOSIS — Z1231 Encounter for screening mammogram for malignant neoplasm of breast: Secondary | ICD-10-CM | POA: Insufficient documentation

## 2021-06-18 NOTE — Telephone Encounter (Signed)
Attempt made to contact Shelly Lane is a 45 y.o. female re: Schedule new pt appt for midline cystocele with Dr. Florian Buff below.  Pt was not available.  LM on the VM for the patient to call me back.

## 2021-06-19 ENCOUNTER — Other Ambulatory Visit: Payer: Self-pay | Admitting: Obstetrics and Gynecology

## 2021-06-19 MED ORDER — MEGESTROL ACETATE 40 MG PO TABS: 40.0000 mg | ORAL_TABLET | Freq: Two times a day (BID) | ORAL | 5 refills | Status: DC

## 2021-06-20 DIAGNOSIS — M25552 Pain in left hip: Secondary | ICD-10-CM | POA: Diagnosis not present

## 2021-06-20 DIAGNOSIS — M79661 Pain in right lower leg: Secondary | ICD-10-CM | POA: Diagnosis not present

## 2021-06-20 DIAGNOSIS — J3081 Allergic rhinitis due to animal (cat) (dog) hair and dander: Secondary | ICD-10-CM | POA: Diagnosis not present

## 2021-06-20 DIAGNOSIS — M545 Low back pain, unspecified: Secondary | ICD-10-CM | POA: Diagnosis not present

## 2021-06-20 DIAGNOSIS — M25551 Pain in right hip: Secondary | ICD-10-CM | POA: Diagnosis not present

## 2021-06-20 DIAGNOSIS — J301 Allergic rhinitis due to pollen: Secondary | ICD-10-CM | POA: Diagnosis not present

## 2021-06-20 DIAGNOSIS — J309 Allergic rhinitis, unspecified: Secondary | ICD-10-CM | POA: Diagnosis not present

## 2021-06-20 DIAGNOSIS — J3089 Other allergic rhinitis: Secondary | ICD-10-CM | POA: Diagnosis not present

## 2021-06-20 DIAGNOSIS — M79662 Pain in left lower leg: Secondary | ICD-10-CM | POA: Diagnosis not present

## 2021-06-20 DIAGNOSIS — G894 Chronic pain syndrome: Secondary | ICD-10-CM | POA: Diagnosis not present

## 2021-07-01 DIAGNOSIS — J302 Other seasonal allergic rhinitis: Secondary | ICD-10-CM | POA: Diagnosis not present

## 2021-07-01 DIAGNOSIS — F419 Anxiety disorder, unspecified: Secondary | ICD-10-CM | POA: Diagnosis not present

## 2021-07-07 DIAGNOSIS — Z419 Encounter for procedure for purposes other than remedying health state, unspecified: Secondary | ICD-10-CM | POA: Diagnosis not present

## 2021-07-08 DIAGNOSIS — M545 Low back pain, unspecified: Secondary | ICD-10-CM | POA: Diagnosis not present

## 2021-07-08 DIAGNOSIS — G8929 Other chronic pain: Secondary | ICD-10-CM | POA: Diagnosis not present

## 2021-07-08 DIAGNOSIS — Z9889 Other specified postprocedural states: Secondary | ICD-10-CM | POA: Diagnosis not present

## 2021-07-08 DIAGNOSIS — R7303 Prediabetes: Secondary | ICD-10-CM | POA: Diagnosis not present

## 2021-07-08 DIAGNOSIS — E559 Vitamin D deficiency, unspecified: Secondary | ICD-10-CM | POA: Diagnosis not present

## 2021-07-08 DIAGNOSIS — Z01818 Encounter for other preprocedural examination: Secondary | ICD-10-CM | POA: Diagnosis not present

## 2021-07-11 DIAGNOSIS — G894 Chronic pain syndrome: Secondary | ICD-10-CM | POA: Diagnosis not present

## 2021-07-11 DIAGNOSIS — M545 Low back pain, unspecified: Secondary | ICD-10-CM | POA: Diagnosis not present

## 2021-07-11 DIAGNOSIS — M79661 Pain in right lower leg: Secondary | ICD-10-CM | POA: Diagnosis not present

## 2021-07-11 DIAGNOSIS — J309 Allergic rhinitis, unspecified: Secondary | ICD-10-CM | POA: Diagnosis not present

## 2021-07-11 DIAGNOSIS — J3081 Allergic rhinitis due to animal (cat) (dog) hair and dander: Secondary | ICD-10-CM | POA: Diagnosis not present

## 2021-07-11 DIAGNOSIS — M79662 Pain in left lower leg: Secondary | ICD-10-CM | POA: Diagnosis not present

## 2021-07-11 DIAGNOSIS — M25552 Pain in left hip: Secondary | ICD-10-CM | POA: Diagnosis not present

## 2021-07-11 DIAGNOSIS — M25551 Pain in right hip: Secondary | ICD-10-CM | POA: Diagnosis not present

## 2021-07-11 DIAGNOSIS — J3089 Other allergic rhinitis: Secondary | ICD-10-CM | POA: Diagnosis not present

## 2021-07-14 DIAGNOSIS — Z888 Allergy status to other drugs, medicaments and biological substances status: Secondary | ICD-10-CM | POA: Diagnosis not present

## 2021-07-14 DIAGNOSIS — K219 Gastro-esophageal reflux disease without esophagitis: Secondary | ICD-10-CM | POA: Diagnosis not present

## 2021-07-14 DIAGNOSIS — I10 Essential (primary) hypertension: Secondary | ICD-10-CM | POA: Diagnosis not present

## 2021-07-14 DIAGNOSIS — Z903 Acquired absence of stomach [part of]: Secondary | ICD-10-CM

## 2021-07-14 DIAGNOSIS — R7303 Prediabetes: Secondary | ICD-10-CM | POA: Diagnosis not present

## 2021-07-14 DIAGNOSIS — E559 Vitamin D deficiency, unspecified: Secondary | ICD-10-CM | POA: Diagnosis not present

## 2021-07-14 DIAGNOSIS — Z79899 Other long term (current) drug therapy: Secondary | ICD-10-CM | POA: Diagnosis not present

## 2021-07-14 DIAGNOSIS — M545 Low back pain, unspecified: Secondary | ICD-10-CM | POA: Diagnosis not present

## 2021-07-14 DIAGNOSIS — G8929 Other chronic pain: Secondary | ICD-10-CM | POA: Diagnosis not present

## 2021-07-14 DIAGNOSIS — Z6841 Body Mass Index (BMI) 40.0 and over, adult: Secondary | ICD-10-CM | POA: Diagnosis not present

## 2021-07-14 DIAGNOSIS — Z885 Allergy status to narcotic agent status: Secondary | ICD-10-CM | POA: Diagnosis not present

## 2021-07-14 DIAGNOSIS — Z9889 Other specified postprocedural states: Secondary | ICD-10-CM | POA: Diagnosis not present

## 2021-07-14 HISTORY — PX: GASTROJEJUNOSTOMY: SHX1697

## 2021-07-14 HISTORY — DX: Acquired absence of stomach (part of): Z90.3

## 2021-07-14 HISTORY — PX: LAPAROSCOPIC GASTRIC SLEEVE RESECTION: SHX5895

## 2021-07-22 DIAGNOSIS — K30 Functional dyspepsia: Secondary | ICD-10-CM | POA: Diagnosis not present

## 2021-07-23 DIAGNOSIS — Z9889 Other specified postprocedural states: Secondary | ICD-10-CM | POA: Diagnosis not present

## 2021-07-23 DIAGNOSIS — R112 Nausea with vomiting, unspecified: Secondary | ICD-10-CM | POA: Diagnosis not present

## 2021-07-23 HISTORY — DX: Other specified postprocedural states: Z98.890

## 2021-08-07 DIAGNOSIS — Z419 Encounter for procedure for purposes other than remedying health state, unspecified: Secondary | ICD-10-CM | POA: Diagnosis not present

## 2021-08-07 DIAGNOSIS — F419 Anxiety disorder, unspecified: Secondary | ICD-10-CM | POA: Diagnosis not present

## 2021-08-07 DIAGNOSIS — F331 Major depressive disorder, recurrent, moderate: Secondary | ICD-10-CM | POA: Diagnosis not present

## 2021-08-08 DIAGNOSIS — M79662 Pain in left lower leg: Secondary | ICD-10-CM | POA: Diagnosis not present

## 2021-08-08 DIAGNOSIS — G894 Chronic pain syndrome: Secondary | ICD-10-CM | POA: Diagnosis not present

## 2021-08-08 DIAGNOSIS — M79661 Pain in right lower leg: Secondary | ICD-10-CM | POA: Diagnosis not present

## 2021-08-08 DIAGNOSIS — M545 Low back pain, unspecified: Secondary | ICD-10-CM | POA: Diagnosis not present

## 2021-08-08 DIAGNOSIS — Z79891 Long term (current) use of opiate analgesic: Secondary | ICD-10-CM | POA: Diagnosis not present

## 2021-08-08 DIAGNOSIS — J3081 Allergic rhinitis due to animal (cat) (dog) hair and dander: Secondary | ICD-10-CM | POA: Diagnosis not present

## 2021-08-08 DIAGNOSIS — J309 Allergic rhinitis, unspecified: Secondary | ICD-10-CM | POA: Diagnosis not present

## 2021-08-08 DIAGNOSIS — M25551 Pain in right hip: Secondary | ICD-10-CM | POA: Diagnosis not present

## 2021-08-08 DIAGNOSIS — J301 Allergic rhinitis due to pollen: Secondary | ICD-10-CM | POA: Diagnosis not present

## 2021-08-08 DIAGNOSIS — M25552 Pain in left hip: Secondary | ICD-10-CM | POA: Diagnosis not present

## 2021-08-14 DIAGNOSIS — R112 Nausea with vomiting, unspecified: Secondary | ICD-10-CM | POA: Diagnosis not present

## 2021-08-14 DIAGNOSIS — Z9889 Other specified postprocedural states: Secondary | ICD-10-CM | POA: Diagnosis not present

## 2021-08-20 DIAGNOSIS — Z9884 Bariatric surgery status: Secondary | ICD-10-CM | POA: Diagnosis not present

## 2021-08-20 DIAGNOSIS — R5383 Other fatigue: Secondary | ICD-10-CM | POA: Diagnosis not present

## 2021-08-20 DIAGNOSIS — N921 Excessive and frequent menstruation with irregular cycle: Secondary | ICD-10-CM | POA: Diagnosis not present

## 2021-08-20 DIAGNOSIS — R42 Dizziness and giddiness: Secondary | ICD-10-CM | POA: Diagnosis not present

## 2021-08-21 DIAGNOSIS — Z9889 Other specified postprocedural states: Secondary | ICD-10-CM | POA: Diagnosis not present

## 2021-08-21 DIAGNOSIS — R112 Nausea with vomiting, unspecified: Secondary | ICD-10-CM | POA: Diagnosis not present

## 2021-08-25 DIAGNOSIS — L259 Unspecified contact dermatitis, unspecified cause: Secondary | ICD-10-CM | POA: Diagnosis not present

## 2021-08-25 DIAGNOSIS — Z6841 Body Mass Index (BMI) 40.0 and over, adult: Secondary | ICD-10-CM | POA: Diagnosis not present

## 2021-08-25 DIAGNOSIS — R7303 Prediabetes: Secondary | ICD-10-CM | POA: Diagnosis not present

## 2021-08-25 DIAGNOSIS — K219 Gastro-esophageal reflux disease without esophagitis: Secondary | ICD-10-CM | POA: Diagnosis not present

## 2021-08-25 DIAGNOSIS — F331 Major depressive disorder, recurrent, moderate: Secondary | ICD-10-CM | POA: Diagnosis not present

## 2021-09-01 DIAGNOSIS — Z6839 Body mass index (BMI) 39.0-39.9, adult: Secondary | ICD-10-CM | POA: Diagnosis not present

## 2021-09-01 DIAGNOSIS — Z9884 Bariatric surgery status: Secondary | ICD-10-CM | POA: Diagnosis not present

## 2021-09-06 DIAGNOSIS — Z419 Encounter for procedure for purposes other than remedying health state, unspecified: Secondary | ICD-10-CM | POA: Diagnosis not present

## 2021-09-08 DIAGNOSIS — Z886 Allergy status to analgesic agent status: Secondary | ICD-10-CM | POA: Diagnosis not present

## 2021-09-08 DIAGNOSIS — G4700 Insomnia, unspecified: Secondary | ICD-10-CM | POA: Insufficient documentation

## 2021-09-08 DIAGNOSIS — Z9049 Acquired absence of other specified parts of digestive tract: Secondary | ICD-10-CM | POA: Diagnosis not present

## 2021-09-08 DIAGNOSIS — K5730 Diverticulosis of large intestine without perforation or abscess without bleeding: Secondary | ICD-10-CM | POA: Diagnosis not present

## 2021-09-08 DIAGNOSIS — R000 Tachycardia, unspecified: Secondary | ICD-10-CM | POA: Diagnosis not present

## 2021-09-08 DIAGNOSIS — R1084 Generalized abdominal pain: Secondary | ICD-10-CM | POA: Diagnosis not present

## 2021-09-08 DIAGNOSIS — Z885 Allergy status to narcotic agent status: Secondary | ICD-10-CM | POA: Diagnosis not present

## 2021-09-08 DIAGNOSIS — R1013 Epigastric pain: Secondary | ICD-10-CM | POA: Diagnosis not present

## 2021-09-08 DIAGNOSIS — Z309 Encounter for contraceptive management, unspecified: Secondary | ICD-10-CM | POA: Diagnosis not present

## 2021-09-08 DIAGNOSIS — R5082 Postprocedural fever: Secondary | ICD-10-CM | POA: Diagnosis not present

## 2021-09-08 DIAGNOSIS — Z79899 Other long term (current) drug therapy: Secondary | ICD-10-CM | POA: Diagnosis not present

## 2021-09-08 DIAGNOSIS — R7303 Prediabetes: Secondary | ICD-10-CM | POA: Diagnosis not present

## 2021-09-08 DIAGNOSIS — F331 Major depressive disorder, recurrent, moderate: Secondary | ICD-10-CM | POA: Diagnosis not present

## 2021-09-08 DIAGNOSIS — Z888 Allergy status to other drugs, medicaments and biological substances status: Secondary | ICD-10-CM | POA: Diagnosis not present

## 2021-09-08 DIAGNOSIS — K219 Gastro-esophageal reflux disease without esophagitis: Secondary | ICD-10-CM | POA: Diagnosis not present

## 2021-09-08 NOTE — Progress Notes (Signed)
[facility] New Patient Evaluation and Consultation  Referring Provider: Catalina Antigua, MD PCP: Fleet Contras, MD Date of Service: 09/09/2021  SUBJECTIVE Chief Complaint: New Patient (Initial Visit)- prolapse  History of Present Illness: Shelly Lane is a 45 y.o. Black or African-American female seen in consultation at the request of Dr. Jolayne Panther for evaluation of prolapse.    Review of records significant for: Has cystocele and rectocele which are becoming more bothersome.   Urinary Symptoms: Leaks urine with with a full bladder and with urgency Leaks 1-2 time(s) per day.  Pad use: 1 liners/ mini-pads per day.   She is bothered by her UI symptoms.  Day time voids 10.  Nocturia: 1 times per night to void. Voiding dysfunction: she empties her bladder well.  does not use a catheter to empty bladder.  When urinating, she feels a weak stream, difficulty starting urine stream, dribbling after finishing, the need to urinate multiple times in a row, and to push on her belly or vagina to empty bladder   UTIs:  0  UTI's in the last year.   Denies history of blood in urine and kidney or bladder stones  Pelvic Organ Prolapse Symptoms:                  She Admits to a feeling of a bulge the vaginal area. It has been present for 7 years.  She Admits to seeing a bulge.  This bulge is bothersome.  Bowel Symptom: Bowel movements: 4-5 time(s) per week Stool consistency: hard Straining: yes.  Splinting: yes.  Incomplete evacuation: yes.  She Denies accidental bowel leakage / fecal incontinence Bowel regimen: fiber  Sexual Function Sexually active: yes.  Pain with sex: Yes, at the vaginal opening, deep in the pelvis, has discomfort due to prolapse, has discomfort due to dryness  Pelvic Pain Admits to pelvic pain- feels prolapse rubbing Pain occurs: with walking Improved by: empty bladder Worsened by: full bladder   Past Medical History:  Past Medical History:   Diagnosis Date   Abnormal Pap smear    Colpo;Last pap 2012, ASCUS w/ negative HPV   Advanced maternal age in multigravida 02/13/2013   Anemia    during preg   Anxiety    Diabetes mellitus during pregnancy, antepartum 12/15/2019   Eczema    GERD (gastroesophageal reflux disease)    Gestational diabetes    diet controlled   Gestational hypertension 04/30/2020   H/O migraine    H/O rubella    H/O varicella    H/O: depression    H/O: obesity    Hx: UTI (urinary tract infection)    Opioid use    Preterm labor    Vaginal Pap smear, abnormal      Past Surgical History:   Past Surgical History:  Procedure Laterality Date   CHOLECYSTECTOMY  2008   GASTRIC BYPASS     HERNIA REPAIR  2012   HIATAL HERNIA REPAIR  2012   WISDOM TOOTH EXTRACTION       Past OB/GYN History: OB History  Gravida Para Term Preterm AB Living  13 11 9 2 2 11   SAB IAB Ectopic Multiple Live Births  2     0 11    # Outcome Date GA Lbr Len/2nd Weight Sex Delivery Anes PTL Lv  13 Term 05/01/20 [redacted]w[redacted]d / 00:05 8 lb 3.6 oz (3.73 kg) M Vag-Spont EPI  LIV  12 Term 07/18/18 [redacted]w[redacted]d 04:11 6 lb 14.4 oz (3.13 kg) M Vag-Spont EPI  LIV  11 Term 03/27/17 [redacted]w[redacted]d 01:21 / 00:02 8 lb 9.2 oz (3.89 kg) F Vag-Spont None  LIV  10 Term 11/15/14 [redacted]w[redacted]d 03:17 / 00:07 8 lb 6.2 oz (3.805 kg) F Vag-Spont Local  LIV  9 Term 10/05/13 [redacted]w[redacted]d 11:10 / 00:02 6 lb 7.5 oz (2.934 kg) F Vag-Spont None  LIV  8 SAB 07/09/12 [redacted]w[redacted]d         7 Term 01/23/10 [redacted]w[redacted]d 25:16 6 lb 13.7 oz (3.11 kg) F Vag-Spont EPI  LIV     Birth Comments: System Generated. Please review and update pregnancy details.  6 Preterm 02/08/09 [redacted]w[redacted]d 10:42 6 lb 9.5 oz (2.99 kg) F Vag-Spont   LIV  5 SAB 2008 [redacted]w[redacted]d         4 Preterm 02/21/04 [redacted]w[redacted]d 05:00 6 lb 5 oz (2.863 kg) F Vag-Spont EPI  LIV  3 Term 08/2002 [redacted]w[redacted]d 21:00 8 lb 15 oz (4.054 kg) M Vag-Spont EPI  LIV  2 Term 04/1999 [redacted]w[redacted]d 05:00 7 lb 9 oz (3.43 kg) M Vag-Spont EPI  LIV  1 Term 11/1997 [redacted]w[redacted]d 12:30 7 lb 6 oz (3.345 kg) F  Vag-Spont EPI  LIV   Contraception: Copper IUD.   Medications: She has a current medication list which includes the following prescription(s): bupropion, clobetasol ointment, cyclobenzaprine, fluticasone, loratadine, lorazepam, megestrol, oxycodone, oxycodone, oxycodone, phentermine, vitafol-nano, topiramate, zolpidem, and zolpidem.   Allergies: Patient is allergic to hydrocodone-acetaminophen, hydroxyzine hcl, reglan [metoclopramide], subutex [buprenorphine], and vicodin [hydrocodone-acetaminophen].   Social History:  Social History   Tobacco Use   Smoking status: Never   Smokeless tobacco: Never  Vaping Use   Vaping Use: Never used  Substance Use Topics   Alcohol use: No   Drug use: Yes    Types: Oxycodone    Comment: per pt prescribed by physician for chronic back pain    Relationship status: married She is employed. Regular exercise: Yes:   History of abuse: No  Family History:   Family History  Problem Relation Age of Onset   Diabetes Maternal Grandmother    Hypertension Maternal Grandmother    Anemia Mother      Review of Systems: Review of Systems  Constitutional:  Positive for weight loss. Negative for fever and malaise/fatigue.  Respiratory:  Negative for cough, shortness of breath and wheezing.   Cardiovascular:  Negative for chest pain, palpitations and leg swelling.  Gastrointestinal:  Positive for abdominal pain. Negative for blood in stool.  Genitourinary:  Negative for dysuria.  Musculoskeletal:  Negative for myalgias.  Skin:  Negative for rash.  Neurological:  Positive for dizziness and headaches.  Endo/Heme/Allergies:  Bruises/bleeds easily.  Psychiatric/Behavioral:  Positive for depression. The patient is nervous/anxious.     OBJECTIVE Physical Exam: Vitals:   09/09/21 1558  BP: 116/87  Pulse: (!) 116  Weight: 246 lb (111.6 kg)  Height: 5' 6.5" (1.689 m)    Physical Exam Constitutional:      General: She is not in acute  distress. Pulmonary:     Effort: Pulmonary effort is normal.  Abdominal:     General: There is no distension.     Palpations: Abdomen is soft.     Tenderness: There is no abdominal tenderness. There is no rebound.  Musculoskeletal:        General: No swelling. Normal range of motion.  Skin:    General: Skin is warm and dry.     Findings: No rash.  Neurological:     Mental Status: She is alert and oriented  to person, place, and time.  Psychiatric:        Mood and Affect: Mood normal.        Behavior: Behavior normal.     GU / Detailed Urogynecologic Evaluation:  Pelvic Exam: Normal external female genitalia; Bartholin's and Skene's glands normal in appearance; urethral meatus normal in appearance, no urethral masses or discharge.   CST: negative  Speculum exam reveals normal vaginal mucosa without atrophy. Cervix normal appearance. IUD strings visualized. Uterus normal single, nontender. Adnexa no mass, fullness, tenderness.     Pelvic floor strength III/V  Pelvic floor musculature: Right levator non-tender, Right obturator tender, Left levator tender, Left obturator tender  POP-Q:   POP-Q  0                                            Aa   0                                           Ba  -4.5                                              C   4.5                                            Gh  3.5                                            Pb  10                                            tvl   0                                            Ap  0                                            Bp  -8                                              D     Rectal Exam:  Normal external rectum  Post-Void Residual (PVR) by Bladder Scan: In order to evaluate bladder emptying, we discussed obtaining a postvoid residual and she agreed to this procedure.  Procedure: The ultrasound unit was placed on the patient's abdomen in the suprapubic region after the patient had voided.  A PVR of 29 ml  was obtained by bladder scan.  Laboratory Results: Unable to leave urine sample  ASSESSMENT AND PLAN Shelly Lane is a 45 y.o. with:  1. Prolapse of anterior vaginal wall   2. Prolapse of posterior vaginal wall   3. Uterovaginal prolapse, incomplete   4. Urinary urgency   5. Dyspareunia, female     Stage II anterior, Stage II posterior, Stage I apical prolapse - For treatment of pelvic organ prolapse, we discussed options for management including expectant management, conservative management, and surgical management, such as Kegels, a pessary, pelvic floor physical therapy, and specific surgical procedures. - She is interested in surgery. We discussed two options for prolapse repair:  1) vaginal repair without mesh - Pros - safer, no mesh complications - Cons - not as strong as mesh repair, higher risk of recurrence  2) laparoscopic repair with mesh - Pros - stronger, better long-term success - Cons - risks of mesh implant (erosion into vagina or bladder, adhering to the rectum, pain) - these risks are lower than with a vaginal mesh but still exist - She prefers a procedure without a hysterectomy due to religious reasons. Recommend sacrospinous hysteropexy with A/P repair.  - Will need urodynamic testing prior   2. Urinary urgency - bothersome but feels it is due to difficulty emptying due to prolapse. Will have her undergo urodynamic testing.   3. Dyspareunia - has some levator tension which reproduces pain. Recommended physical therapy, but she would prefer to wait until after the surgery is complete.   Return for urodynamic testing  Marguerita Beards, MD   Medical Decision Making:  - Reviewed/ ordered medicine test - Review and summation of prior records

## 2021-09-09 ENCOUNTER — Encounter: Payer: Self-pay | Admitting: Obstetrics and Gynecology

## 2021-09-09 ENCOUNTER — Ambulatory Visit (INDEPENDENT_AMBULATORY_CARE_PROVIDER_SITE_OTHER): Payer: Medicaid Other | Admitting: Obstetrics and Gynecology

## 2021-09-09 ENCOUNTER — Other Ambulatory Visit: Payer: Self-pay

## 2021-09-09 VITALS — BP 116/87 | HR 116 | Ht 66.5 in | Wt 246.0 lb

## 2021-09-09 DIAGNOSIS — R3915 Urgency of urination: Secondary | ICD-10-CM | POA: Diagnosis not present

## 2021-09-09 DIAGNOSIS — Z9049 Acquired absence of other specified parts of digestive tract: Secondary | ICD-10-CM | POA: Diagnosis not present

## 2021-09-09 DIAGNOSIS — N8110 Cystocele, unspecified: Secondary | ICD-10-CM | POA: Diagnosis not present

## 2021-09-09 DIAGNOSIS — M25552 Pain in left hip: Secondary | ICD-10-CM | POA: Diagnosis not present

## 2021-09-09 DIAGNOSIS — N816 Rectocele: Secondary | ICD-10-CM | POA: Diagnosis not present

## 2021-09-09 DIAGNOSIS — M79661 Pain in right lower leg: Secondary | ICD-10-CM | POA: Diagnosis not present

## 2021-09-09 DIAGNOSIS — N941 Unspecified dyspareunia: Secondary | ICD-10-CM

## 2021-09-09 DIAGNOSIS — N812 Incomplete uterovaginal prolapse: Secondary | ICD-10-CM

## 2021-09-09 DIAGNOSIS — J3081 Allergic rhinitis due to animal (cat) (dog) hair and dander: Secondary | ICD-10-CM | POA: Diagnosis not present

## 2021-09-09 DIAGNOSIS — M79662 Pain in left lower leg: Secondary | ICD-10-CM | POA: Diagnosis not present

## 2021-09-09 DIAGNOSIS — K5730 Diverticulosis of large intestine without perforation or abscess without bleeding: Secondary | ICD-10-CM | POA: Diagnosis not present

## 2021-09-09 DIAGNOSIS — R000 Tachycardia, unspecified: Secondary | ICD-10-CM | POA: Diagnosis not present

## 2021-09-09 DIAGNOSIS — M25551 Pain in right hip: Secondary | ICD-10-CM | POA: Diagnosis not present

## 2021-09-09 DIAGNOSIS — R5082 Postprocedural fever: Secondary | ICD-10-CM | POA: Diagnosis not present

## 2021-09-09 DIAGNOSIS — J301 Allergic rhinitis due to pollen: Secondary | ICD-10-CM | POA: Diagnosis not present

## 2021-09-09 DIAGNOSIS — M545 Low back pain, unspecified: Secondary | ICD-10-CM | POA: Diagnosis not present

## 2021-09-09 DIAGNOSIS — J309 Allergic rhinitis, unspecified: Secondary | ICD-10-CM | POA: Diagnosis not present

## 2021-09-09 DIAGNOSIS — G894 Chronic pain syndrome: Secondary | ICD-10-CM | POA: Diagnosis not present

## 2021-09-09 NOTE — Patient Instructions (Signed)

## 2021-09-15 DIAGNOSIS — F331 Major depressive disorder, recurrent, moderate: Secondary | ICD-10-CM | POA: Diagnosis not present

## 2021-09-30 ENCOUNTER — Ambulatory Visit: Payer: Medicaid Other | Admitting: Obstetrics

## 2021-09-30 ENCOUNTER — Telehealth: Payer: Medicaid Other | Admitting: Advanced Practice Midwife

## 2021-09-30 DIAGNOSIS — Z30431 Encounter for routine checking of intrauterine contraceptive device: Secondary | ICD-10-CM

## 2021-09-30 NOTE — Progress Notes (Signed)
  Pt not available/did not return call when text sent for her visit today. She was available earlier when called by RN and checked in. Will call pt to reschedule.

## 2021-10-01 ENCOUNTER — Ambulatory Visit: Payer: Medicaid Other | Admitting: Obstetrics & Gynecology

## 2021-10-02 ENCOUNTER — Other Ambulatory Visit: Payer: Self-pay

## 2021-10-02 ENCOUNTER — Telehealth: Payer: Medicaid Other | Admitting: Advanced Practice Midwife

## 2021-10-03 DIAGNOSIS — Z309 Encounter for contraceptive management, unspecified: Secondary | ICD-10-CM | POA: Diagnosis not present

## 2021-10-03 DIAGNOSIS — R7303 Prediabetes: Secondary | ICD-10-CM | POA: Diagnosis not present

## 2021-10-03 DIAGNOSIS — F419 Anxiety disorder, unspecified: Secondary | ICD-10-CM | POA: Diagnosis not present

## 2021-10-03 DIAGNOSIS — Z6838 Body mass index (BMI) 38.0-38.9, adult: Secondary | ICD-10-CM | POA: Diagnosis not present

## 2021-10-03 DIAGNOSIS — F331 Major depressive disorder, recurrent, moderate: Secondary | ICD-10-CM | POA: Diagnosis not present

## 2021-10-03 DIAGNOSIS — B355 Tinea imbricata: Secondary | ICD-10-CM | POA: Diagnosis not present

## 2021-10-07 DIAGNOSIS — J3081 Allergic rhinitis due to animal (cat) (dog) hair and dander: Secondary | ICD-10-CM | POA: Diagnosis not present

## 2021-10-07 DIAGNOSIS — M545 Low back pain, unspecified: Secondary | ICD-10-CM | POA: Diagnosis not present

## 2021-10-07 DIAGNOSIS — Z419 Encounter for procedure for purposes other than remedying health state, unspecified: Secondary | ICD-10-CM | POA: Diagnosis not present

## 2021-10-07 DIAGNOSIS — M25551 Pain in right hip: Secondary | ICD-10-CM | POA: Diagnosis not present

## 2021-10-07 DIAGNOSIS — M79662 Pain in left lower leg: Secondary | ICD-10-CM | POA: Diagnosis not present

## 2021-10-07 DIAGNOSIS — J301 Allergic rhinitis due to pollen: Secondary | ICD-10-CM | POA: Diagnosis not present

## 2021-10-07 DIAGNOSIS — M25552 Pain in left hip: Secondary | ICD-10-CM | POA: Diagnosis not present

## 2021-10-07 DIAGNOSIS — G894 Chronic pain syndrome: Secondary | ICD-10-CM | POA: Diagnosis not present

## 2021-10-07 DIAGNOSIS — M79661 Pain in right lower leg: Secondary | ICD-10-CM | POA: Diagnosis not present

## 2021-10-07 DIAGNOSIS — J309 Allergic rhinitis, unspecified: Secondary | ICD-10-CM | POA: Diagnosis not present

## 2021-10-09 DIAGNOSIS — F32A Depression, unspecified: Secondary | ICD-10-CM | POA: Diagnosis not present

## 2021-10-09 DIAGNOSIS — K589 Irritable bowel syndrome without diarrhea: Secondary | ICD-10-CM | POA: Diagnosis not present

## 2021-10-09 DIAGNOSIS — Z9889 Other specified postprocedural states: Secondary | ICD-10-CM | POA: Diagnosis not present

## 2021-10-10 DIAGNOSIS — K589 Irritable bowel syndrome without diarrhea: Secondary | ICD-10-CM | POA: Diagnosis not present

## 2021-10-13 ENCOUNTER — Telehealth (INDEPENDENT_AMBULATORY_CARE_PROVIDER_SITE_OTHER): Payer: Medicaid Other | Admitting: Advanced Practice Midwife

## 2021-10-13 DIAGNOSIS — T8332XA Displacement of intrauterine contraceptive device, initial encounter: Secondary | ICD-10-CM | POA: Insufficient documentation

## 2021-10-13 DIAGNOSIS — Z3009 Encounter for other general counseling and advice on contraception: Secondary | ICD-10-CM | POA: Diagnosis not present

## 2021-10-13 DIAGNOSIS — T8332XS Displacement of intrauterine contraceptive device, sequela: Secondary | ICD-10-CM | POA: Diagnosis not present

## 2021-10-13 DIAGNOSIS — M6289 Other specified disorders of muscle: Secondary | ICD-10-CM | POA: Diagnosis not present

## 2021-10-13 NOTE — Progress Notes (Signed)
GYNECOLOGY VIRTUAL VISIT ENCOUNTER NOTE  Provider location: [facility] for [facility]   Patient location: Home  I connected with Shelly Lane on 10/13/21 at  1:50 PM EST by MyChart Video Encounter and verified that I am speaking with the correct person using two identifiers.   I discussed the limitations, risks, security and privacy concerns of performing an evaluation and management service virtually and the availability of in person appointments. I also discussed with the patient that there may be a patient responsible charge related to this service. The patient expressed understanding and agreed to proceed.   History:  Shelly Lane is a 45 y.o. [id number] female being evaluated today for contraceptive visit. She had a Paragard IUD that came out at home 2 weeks ago. She resumed using Nuvaring and has not had any bleeding since the IUD came out. She is interested in talking about her options to replace the IUD or other forms of contraception..      Past Medical History:  Diagnosis Date   Abnormal Pap smear    Colpo;Last pap 2012, ASCUS w/ negative HPV   Advanced maternal age in multigravida 02/13/2013   Anemia    during preg   Anxiety    Diabetes mellitus during pregnancy, antepartum 12/15/2019   Eczema    GERD (gastroesophageal reflux disease)    Gestational diabetes    diet controlled   Gestational hypertension 04/30/2020   H/O migraine    H/O rubella    H/O varicella    H/O: depression    H/O: obesity    Hx: UTI (urinary tract infection)    Opioid use    Preterm labor    Vaginal Pap smear, abnormal    Past Surgical History:  Procedure Laterality Date   CHOLECYSTECTOMY  2008   GASTRIC BYPASS     HERNIA REPAIR  2012   HIATAL HERNIA REPAIR  2012   WISDOM TOOTH EXTRACTION     The following portions of the patient's history were reviewed and updated as appropriate: allergies, current medications, past family history, past medical history, past  social history, past surgical history and problem list.   Health Maintenance:  Normal pap and negative HRHPV on 10/06/19.    Review of Systems:  Pertinent items noted in HPI and remainder of comprehensive ROS otherwise negative.  Physical Exam:   General:  Alert, oriented and cooperative. Patient appears to be in no acute distress.  Mental Status: Normal mood and affect. Normal behavior. Normal judgment and thought content.   Respiratory: Normal respiratory effort, no problems with respiration noted  Rest of physical exam deferred due to type of encounter  Labs and Imaging No results found for this or any previous visit (from the past 336 hour(s)). No results found.     Assessment and Plan:     1. Pelvic floor dysfunction in female --Pt saw urogynecology, considering surgical options --Discussed PT as option to improve symptoms, may still need surgery but can follow up with PT after surgery too - Ambulatory referral to Physical Therapy  2. Encounter for counseling regarding contraception --Discussed pt contraceptive plans and reviewed contraceptive methods based on pt preferences and effectiveness.  Pt prefers to try hormonal IUD.   --Had irregular heavy bleeding with Paragard, IUD came out 2 weeks ago, pt is currently using Nuvaring but desires hormonal IUD for lighter bleeding --Schedule IUD insertion, consider [place] for placement and close follow up to make sure it remains in place  I discussed the assessment and treatment plan with the patient. The patient was provided an opportunity to ask questions and all were answered. The patient agreed with the plan and demonstrated an understanding of the instructions.   The patient was advised to call back or seek an in-person evaluation/go to the ED if the symptoms worsen or if the condition fails to improve as anticipated.  I provided 10  minutes of face-to-face time during this encounter.   Sharen Counter, CNM Center for  [employer], [facility]

## 2021-10-15 DIAGNOSIS — E669 Obesity, unspecified: Secondary | ICD-10-CM | POA: Diagnosis not present

## 2021-10-15 DIAGNOSIS — Z713 Dietary counseling and surveillance: Secondary | ICD-10-CM | POA: Diagnosis not present

## 2021-10-20 ENCOUNTER — Telehealth: Payer: Medicaid Other | Admitting: Advanced Practice Midwife

## 2021-10-21 ENCOUNTER — Other Ambulatory Visit: Payer: Self-pay

## 2021-10-21 ENCOUNTER — Ambulatory Visit (INDEPENDENT_AMBULATORY_CARE_PROVIDER_SITE_OTHER): Payer: Medicaid Other | Admitting: Obstetrics and Gynecology

## 2021-10-21 VITALS — BP 114/79 | HR 99 | Ht 66.5 in | Wt 234.0 lb

## 2021-10-21 DIAGNOSIS — R350 Frequency of micturition: Secondary | ICD-10-CM | POA: Diagnosis not present

## 2021-10-21 LAB — POCT URINALYSIS DIPSTICK
Appearance: NORMAL
Bilirubin, UA: NEGATIVE
Glucose, UA: NEGATIVE
Leukocytes, UA: NEGATIVE
Nitrite, UA: NEGATIVE
Protein, UA: POSITIVE — AB
Spec Grav, UA: 1.025 (ref 1.010–1.025)
Urobilinogen, UA: 1 E.U./dL
pH, UA: 6 (ref 5.0–8.0)

## 2021-10-21 NOTE — Patient Instructions (Signed)

## 2021-10-23 NOTE — Progress Notes (Signed)
[facility] Urodynamics Procedure  Referring Physician: Fleet Contras, MD Date of Procedure: 10/21/2021  Shelly Lane is a 45 y.o. female who presents for urodynamic evaluation. Indication(s) for study: UUI and sensation of incomplete emptying  Vital Signs: BP 114/79   Pulse 99   Ht 5' 6.5" (1.689 m)   Wt 234 lb (106.1 kg)   LMP 10/21/2021   BMI 37.20 kg/m   Laboratory Results: A catheterized urine specimen revealed:  POC urine: positive protein  Voiding Diary: Not completed  Procedure Timeout:  The correct patient was verified and the correct procedure was verified. The patient was in the correct position and safety precautions were reviewed based on at the patient's history.  Urodynamic Procedure A 53F dual lumen urodynamics catheter was placed under sterile conditions into the patient's bladder. A 53F catheter was placed into the rectum in order to measure abdominal pressure. EMG patches were placed in the appropriate position.  All connections were confirmed and calibrations/adjusted made. Saline was instilled into the bladder through the dual lumen catheters.  Cough/valsalva pressures were measured periodically during filling.  Patient was allowed to void.  The bladder was then emptied of its residual.  UROFLOW: Revealed a Qmax of 7 mL/sec.  She voided 24 mL and had a residual of 2 mL.  It was a interrupted pattern and represented normal habits though interpretation limited due to low voided volume.  CMG: This was performed with sterile water in the sitting position at a fill rate of 20-30 mL/min.    First sensation of fullness was 62 mLs,  First urge was 104 mLs,  Strong urge was 253 mLs and  Capacity was 298 mLs  Stress incontinence was not demonstrated Highest negative Barrier CLPP was 108 cmH20 at 298 ml in the standing position. Highest negative Barrier VLPP was 98 cmH20 at 200 ml.  Detrusor function was normal, with no phasic contractions  seen.    Compliance:  normal. End fill detrusor pressure was 1.1cmH20.  Calculated compliance was 137mL/cmH20  UPP: MUCP with/ barrier reduction was 109 cm of water.    MICTURITION STUDY: Voiding was performed with reduction using scopettes in the sitting position.  Pdet at Qmax was 26 cm of water.  Qmax was 15 mL/sec.  It was a normal pattern.  She voided 293 mL and had a residual of 3 mL.  It was a volitional void, sustained detrusor contraction was present and abdominal straining was present  EMG: This was performed with patches.  She had voluntary contractions, recruitment with fill was present and urethral sphincter was relaxed with void.  The details of the procedure with the study tracings have been scanned into EPIC.   Urodynamic Impression:  1. Sensation was normal; capacity was normal 2. Stress Incontinence was not demonstrated. 3. Detrusor Overactivity was not demonstrated. 4. Emptying was normal with a normal PVR, a sustained detrusor contraction present,  abdominal straining present, normal urethral sphincter activity on EMG.  Plan: - The patient will follow up  to discuss the findings and treatment options.

## 2021-10-28 ENCOUNTER — Encounter: Payer: Self-pay | Admitting: Advanced Practice Midwife

## 2021-10-28 ENCOUNTER — Ambulatory Visit (INDEPENDENT_AMBULATORY_CARE_PROVIDER_SITE_OTHER): Payer: Medicaid Other | Admitting: Advanced Practice Midwife

## 2021-10-28 ENCOUNTER — Other Ambulatory Visit: Payer: Self-pay

## 2021-10-28 VITALS — BP 122/79 | HR 95 | Ht 66.5 in | Wt 232.0 lb

## 2021-10-28 DIAGNOSIS — Z30430 Encounter for insertion of intrauterine contraceptive device: Secondary | ICD-10-CM

## 2021-10-28 LAB — POCT URINE PREGNANCY: Preg Test, Ur: NEGATIVE

## 2021-10-28 MED ORDER — LEVONORGESTREL 20 MCG/DAY IU IUD
1.0000 | INTRAUTERINE_SYSTEM | Freq: Once | INTRAUTERINE | Status: AC
Start: 2021-10-28 — End: 2021-10-28
  Administered 2021-10-28: 1 via INTRAUTERINE

## 2021-10-28 NOTE — Progress Notes (Addendum)
GYN presents for IUD Insertion. Last unprotected sex was 2 weeks ago  LMP 10/18/2021  UPT today is NEGATIVE  Administrations This Visit     levonorgestrel (MIRENA) 20 MCG/DAY IUD 1 each     Admin Date 10/28/2021 Action Given Dose 1 each Route Intrauterine Administered By Maretta Bees, RMA

## 2021-10-28 NOTE — Progress Notes (Signed)
   GYNECOLOGY OFFICE PROCEDURE NOTE  Shelly Lane is a 45 y.o. [id number] here for Mirena IUD insertion. No GYN concerns.  Last pap smear was on 11/06/2019 and was normal.  IUD Insertion Procedure Note Patient identified, informed consent performed, consent signed.   Discussed risks of irregular bleeding, cramping, infection, malpositioning or misplacement of the IUD outside the uterus which may require further procedure such as laparoscopy. Time out was performed.  Urine pregnancy test negative.  Speculum placed in the vagina.  Cervix visualized.  Cleaned with Betadine x 2.  Grasped anteriorly with a single tooth tenaculum.  Uterus sounded to 8.5 cm.  Mirena IUD placed per manufacturer's recommendations.  Strings trimmed to 4 cm. Tenaculum was removed, good hemostasis noted.  Patient tolerated procedure well.   Patient was given post-procedure instructions.  Nuvaring was removed today during insertion.  Wait 24-48 hours before intercourse.  Patient was also asked to check IUD strings periodically and follow up in 4 weeks for IUD check.

## 2021-10-30 ENCOUNTER — Encounter: Payer: Self-pay | Admitting: Obstetrics and Gynecology

## 2021-10-30 ENCOUNTER — Encounter: Payer: Self-pay | Admitting: Advanced Practice Midwife

## 2021-11-03 ENCOUNTER — Ambulatory Visit: Admission: RE | Admit: 2021-11-03 | Payer: Medicaid Other | Source: Ambulatory Visit

## 2021-11-03 DIAGNOSIS — Z0000 Encounter for general adult medical examination without abnormal findings: Secondary | ICD-10-CM | POA: Diagnosis not present

## 2021-11-03 DIAGNOSIS — F331 Major depressive disorder, recurrent, moderate: Secondary | ICD-10-CM | POA: Diagnosis not present

## 2021-11-04 DIAGNOSIS — J3081 Allergic rhinitis due to animal (cat) (dog) hair and dander: Secondary | ICD-10-CM | POA: Diagnosis not present

## 2021-11-04 DIAGNOSIS — M545 Low back pain, unspecified: Secondary | ICD-10-CM | POA: Diagnosis not present

## 2021-11-04 DIAGNOSIS — G894 Chronic pain syndrome: Secondary | ICD-10-CM | POA: Diagnosis not present

## 2021-11-04 DIAGNOSIS — J3089 Other allergic rhinitis: Secondary | ICD-10-CM | POA: Diagnosis not present

## 2021-11-04 DIAGNOSIS — J301 Allergic rhinitis due to pollen: Secondary | ICD-10-CM | POA: Diagnosis not present

## 2021-11-04 DIAGNOSIS — M79661 Pain in right lower leg: Secondary | ICD-10-CM | POA: Diagnosis not present

## 2021-11-04 DIAGNOSIS — M25552 Pain in left hip: Secondary | ICD-10-CM | POA: Diagnosis not present

## 2021-11-04 DIAGNOSIS — M25551 Pain in right hip: Secondary | ICD-10-CM | POA: Diagnosis not present

## 2021-11-04 DIAGNOSIS — M79662 Pain in left lower leg: Secondary | ICD-10-CM | POA: Diagnosis not present

## 2021-11-06 ENCOUNTER — Encounter: Payer: Self-pay | Admitting: Obstetrics and Gynecology

## 2021-11-06 ENCOUNTER — Other Ambulatory Visit: Payer: Self-pay

## 2021-11-06 ENCOUNTER — Ambulatory Visit (INDEPENDENT_AMBULATORY_CARE_PROVIDER_SITE_OTHER): Payer: Medicaid Other | Admitting: Obstetrics and Gynecology

## 2021-11-06 VITALS — BP 106/76 | HR 112

## 2021-11-06 DIAGNOSIS — Z419 Encounter for procedure for purposes other than remedying health state, unspecified: Secondary | ICD-10-CM | POA: Diagnosis not present

## 2021-11-06 DIAGNOSIS — N8110 Cystocele, unspecified: Secondary | ICD-10-CM | POA: Diagnosis not present

## 2021-11-06 DIAGNOSIS — N816 Rectocele: Secondary | ICD-10-CM

## 2021-11-06 DIAGNOSIS — N812 Incomplete uterovaginal prolapse: Secondary | ICD-10-CM

## 2021-11-06 NOTE — Progress Notes (Signed)
[facility] Return Visit  SUBJECTIVE  History of Present Illness: Shelly Lane is a 45 y.o. female seen in follow-up to discuss surgery after urodynamic testing.   Urodynamic Impression:  1. Sensation was normal; capacity was normal 2. Stress Incontinence was not demonstrated. 3. Detrusor Overactivity was not demonstrated. 4. Emptying was normal with a normal PVR, a sustained detrusor contraction present,  abdominal straining present, normal urethral sphincter activity on EMG.    Past Medical History: Patient  has a past medical history of Abnormal Pap smear, Advanced maternal age in multigravida (02/13/2013), Anemia, Anxiety, Diabetes mellitus during pregnancy, antepartum (12/15/2019), Eczema, GERD (gastroesophageal reflux disease), Gestational diabetes, Gestational hypertension (04/30/2020), H/O migraine, H/O rubella, H/O varicella, H/O: depression, H/O: obesity, UTI (urinary tract infection), Opioid use, Preterm labor, and Vaginal Pap smear, abnormal.   Past Surgical History: She  has a past surgical history that includes Hiatal hernia repair (2012); Wisdom tooth extraction; Hernia repair (2012); Cholecystectomy (2008); and Gastric bypass.   Medications: She has a current medication list which includes the following prescription(s): bupropion, clobetasol ointment, cyclobenzaprine, dicyclomine, dss, eluryng, escitalopram, fluticasone, loratadine, metformin, oxycodone, and zolpidem.   Allergies: Patient is allergic to hydrocodone-acetaminophen, hydroxyzine hcl, reglan [metoclopramide], subutex [buprenorphine], and vicodin [hydrocodone-acetaminophen].   Social History: Patient  reports that she has never smoked. She has never used smokeless tobacco. She reports current drug use. Drug: Oxycodone. She reports that she does not drink alcohol.      OBJECTIVE     Physical Exam: Vitals:   11/06/21 1130  BP: 106/76  Pulse: (!) 112   Gen: No apparent distress, A&O x  3.  Detailed Urogynecologic Evaluation:  Deferred. Prior exam showed:  POP-Q (09/09/21):    POP-Q   0                                            Aa   0                                           Ba   -4.5                                              C    4.5                                            Gh   3.5                                            Pb   10                                            tvl    0  Ap   0                                            Bp   -8                                              D      ASSESSMENT AND PLAN    Shelly Lane is a 45 y.o. with:  1. Prolapse of anterior vaginal wall   2. Prolapse of posterior vaginal wall   3. Uterovaginal prolapse, incomplete     Plan for surgery: Exam under anesthesia, sacrospinous hysteropexy, anterior and posterior repair, cystoscopy  - We reviewed the patient's specific anatomic and functional findings, with the assistance of diagrams, and together finalized the above procedure. The planned surgical procedures were discussed along with the surgical risks outlined below, which were also provided on a detailed handout. Additional treatment options including expectant management, conservative management, medical management were discussed where appropriate.  We reviewed the benefits and risks of each treatment option.  - She prefers uterine preservation.   General Surgical Risks: For all procedures, there are risks of bleeding, infection, damage to surrounding organs including but not limited to bowel, bladder, blood vessels, ureters and nerves, and need for further surgery if an injury were to occur. These risks are all low with minimally invasive surgery.   There are risks of numbness and weakness at any body site or buttock/rectal pain.  It is possible that baseline pain can be worsened by surgery, either with or without mesh. If surgery is vaginal, there is also  a low risk of possible conversion to laparoscopy or open abdominal incision where indicated. Very rare risks include blood transfusion, blood clot, heart attack, pneumonia, or death.   There is also a risk of short-term postoperative urinary retention with need to use a catheter. About half of patients need to go home from surgery with a catheter, which is then later removed in the office. The risk of long-term need for a catheter is very low. There is also a risk of worsening of overactive bladder.   Prolapse (with or without mesh): Risk factors for surgical failure  include things that put pressure on your pelvis and the surgical repair, including obesity, chronic cough, and heavy lifting or straining (including lifting children or adults, straining on the toilet, or lifting heavy objects such as furniture or anything weighing >25 lbs. Risks of recurrence is 20-30% with vaginal native tissue repair and a less than 10% with sacrocolpopexy with mesh.    - For preop Visit:  She is required to have a visit within 30 days of her surgery.   Today we reviewed pre-operative preparation, peri-operative expectations, and post-operative instructions/recovery.  She was provided with instructional handouts. She understands not to take aspirin (>81mg ) or NSAIDs 7 days prior to surgery. Prescriptions will be provided for: Oxycodone 5mg , Ibuprofen 600mg , Tylenol 500mg , Miralax. These prescriptions will be sent prior to surgery.  - Medical clearance: not required  - Anticoagulant use: No - Medicaid Hysterectomy form: No - Accepts blood transfusion: Yes - Expected length of stay: outpatient  Request sent for surgery scheduling.   , MD  Time spent: I spent 30 minutes dedicated  to the care of this patient on the date of this encounter to include pre-visit review of records, face-to-face time with the patient discussing surgery and post visit documentation.

## 2021-11-18 ENCOUNTER — Encounter (HOSPITAL_BASED_OUTPATIENT_CLINIC_OR_DEPARTMENT_OTHER): Payer: Self-pay | Admitting: *Deleted

## 2021-11-26 ENCOUNTER — Ambulatory Visit: Payer: Medicaid Other | Admitting: Women's Health

## 2021-12-02 DIAGNOSIS — J3089 Other allergic rhinitis: Secondary | ICD-10-CM | POA: Diagnosis not present

## 2021-12-02 DIAGNOSIS — M25551 Pain in right hip: Secondary | ICD-10-CM | POA: Diagnosis not present

## 2021-12-02 DIAGNOSIS — M545 Low back pain, unspecified: Secondary | ICD-10-CM | POA: Diagnosis not present

## 2021-12-02 DIAGNOSIS — M79662 Pain in left lower leg: Secondary | ICD-10-CM | POA: Diagnosis not present

## 2021-12-02 DIAGNOSIS — J3081 Allergic rhinitis due to animal (cat) (dog) hair and dander: Secondary | ICD-10-CM | POA: Diagnosis not present

## 2021-12-02 DIAGNOSIS — J301 Allergic rhinitis due to pollen: Secondary | ICD-10-CM | POA: Diagnosis not present

## 2021-12-02 DIAGNOSIS — M79661 Pain in right lower leg: Secondary | ICD-10-CM | POA: Diagnosis not present

## 2021-12-02 DIAGNOSIS — M25552 Pain in left hip: Secondary | ICD-10-CM | POA: Diagnosis not present

## 2021-12-02 DIAGNOSIS — G894 Chronic pain syndrome: Secondary | ICD-10-CM | POA: Diagnosis not present

## 2021-12-07 DIAGNOSIS — Z419 Encounter for procedure for purposes other than remedying health state, unspecified: Secondary | ICD-10-CM | POA: Diagnosis not present

## 2021-12-11 ENCOUNTER — Telehealth: Payer: Medicaid Other | Admitting: Obstetrics and Gynecology

## 2021-12-18 ENCOUNTER — Other Ambulatory Visit: Payer: Self-pay | Admitting: Internal Medicine

## 2021-12-18 ENCOUNTER — Telehealth (INDEPENDENT_AMBULATORY_CARE_PROVIDER_SITE_OTHER): Payer: Medicaid Other | Admitting: Obstetrics and Gynecology

## 2021-12-18 ENCOUNTER — Other Ambulatory Visit: Payer: Self-pay

## 2021-12-18 DIAGNOSIS — Z0000 Encounter for general adult medical examination without abnormal findings: Secondary | ICD-10-CM | POA: Diagnosis not present

## 2021-12-18 DIAGNOSIS — R7303 Prediabetes: Secondary | ICD-10-CM | POA: Diagnosis not present

## 2021-12-18 DIAGNOSIS — N816 Rectocele: Secondary | ICD-10-CM

## 2021-12-18 DIAGNOSIS — Z6835 Body mass index (BMI) 35.0-35.9, adult: Secondary | ICD-10-CM | POA: Diagnosis not present

## 2021-12-18 DIAGNOSIS — M5459 Other low back pain: Secondary | ICD-10-CM | POA: Diagnosis not present

## 2021-12-18 DIAGNOSIS — N8110 Cystocele, unspecified: Secondary | ICD-10-CM

## 2021-12-18 DIAGNOSIS — N812 Incomplete uterovaginal prolapse: Secondary | ICD-10-CM

## 2021-12-18 DIAGNOSIS — E559 Vitamin D deficiency, unspecified: Secondary | ICD-10-CM | POA: Diagnosis not present

## 2021-12-18 MED ORDER — IBUPROFEN 600 MG PO TABS: 600.0000 mg | ORAL_TABLET | Freq: Four times a day (QID) | ORAL | 0 refills | Status: DC | PRN

## 2021-12-18 MED ORDER — POLYETHYLENE GLYCOL 3350 17 GM/SCOOP PO POWD: 17.0000 g | Freq: Every day | ORAL | 0 refills | Status: DC

## 2021-12-18 MED ORDER — ACETAMINOPHEN 500 MG PO TABS: 500.0000 mg | ORAL_TABLET | Freq: Four times a day (QID) | ORAL | 0 refills | Status: DC | PRN

## 2021-12-18 MED ORDER — OXYCODONE HCL 5 MG PO TABS: 5.0000 mg | ORAL_TABLET | ORAL | 0 refills | Status: DC | PRN

## 2021-12-18 NOTE — H&P (Signed)
[facility] Pre-Operative H&P  Subjective Chief Complaint: Shelly Lane presents for a preoperative encounter.   History of Present Illness: Shelly Lane is a 46 y.o. female who presents for preoperative visit.  She is scheduled to undergo Exam under anesthesia, sacrospinous hysteropexy, anterior and posterior repair, cystoscopy on 12/25/21.  Her symptoms include vaginal bulge, and she was was found to have Stage II anterior, Stage II posterior, Stage I apical prolapse .   Urodynamics showed: Urodynamic Impression:  1. Sensation was normal; capacity was normal 2. Stress Incontinence was not demonstrated. 3. Detrusor Overactivity was not demonstrated. 4. Emptying was normal with a normal PVR, a sustained detrusor contraction present,  abdominal straining present, normal urethral sphincter activity on EMG.    Past Medical History:  Diagnosis Date   Abnormal Pap smear    Colpo;Last pap 2012, ASCUS w/ negative HPV   Advanced maternal age in multigravida 02/13/2013   Anemia    during preg   Anxiety    Diabetes mellitus during pregnancy, antepartum 12/15/2019   Eczema    GERD (gastroesophageal reflux disease)    Gestational diabetes    diet controlled   Gestational hypertension 04/30/2020   H/O migraine    H/O rubella    H/O varicella    H/O: depression    H/O: obesity    Hx: UTI (urinary tract infection)    Opioid use    Preterm labor    Vaginal Pap smear, abnormal      Past Surgical History:  Procedure Laterality Date   CHOLECYSTECTOMY  2008   GASTRIC BYPASS     HERNIA REPAIR  2012   HIATAL HERNIA REPAIR  2012   WISDOM TOOTH EXTRACTION      is allergic to hydrocodone-acetaminophen, hydroxyzine hcl, reglan [metoclopramide], subutex [buprenorphine], and vicodin [hydrocodone-acetaminophen].   Family History  Problem Relation Age of Onset   Diabetes Maternal Grandmother    Hypertension Maternal Grandmother    Anemia Mother     Social History    Tobacco Use   Smoking status: Never   Smokeless tobacco: Never  Vaping Use   Vaping Use: Never used  Substance Use Topics   Alcohol use: No   Drug use: Yes    Types: Oxycodone    Comment: per pt prescribed by physician for chronic back pain     Review of Systems was negative for a full 10 system review except as noted in the History of Present Illness.  No current facility-administered medications for this encounter.  Current Outpatient Medications:    acetaminophen (TYLENOL) 500 MG tablet, Take 1 tablet (500 mg total) by mouth every 6 (six) hours as needed (pain)., Disp: 30 tablet, Rfl: 0   buPROPion (WELLBUTRIN SR) 150 MG 12 hr tablet, Take 150 mg by mouth 2 (two) times daily., Disp: , Rfl:    clobetasol ointment (TEMOVATE) [id number] %, Apply 1 application topically 2 (two) times daily as needed for dry skin., Disp: , Rfl:    cyclobenzaprine (FLEXERIL) 10 MG tablet, Take 10 mg by mouth every 8 (eight) hours., Disp: , Rfl:    dicyclomine (BENTYL) 10 MG capsule, Take 10 mg by mouth 3 (three) times daily as needed., Disp: , Rfl:    Docusate Sodium (DSS) 100 MG CAPS, Take by mouth., Disp: , Rfl:    ELURYNG 0.12-0.015 MG/24HR vaginal ring, Place vaginally., Disp: , Rfl:    escitalopram (LEXAPRO) 10 MG tablet, SMARTSIG:1 Tablet(s) By Mouth Every Evening, Disp: , Rfl:    fluticasone (FLONASE)  50 MCG/ACT nasal spray, Place 2 sprays into both nostrils daily., Disp: , Rfl:    ibuprofen (ADVIL) 600 MG tablet, Take 1 tablet (600 mg total) by mouth every 6 (six) hours as needed., Disp: 30 tablet, Rfl: 0   loratadine (CLARITIN) 10 MG tablet, Take 10 mg by mouth daily as needed., Disp: , Rfl:    metFORMIN (GLUCOPHAGE) 500 MG tablet, Take 500 mg by mouth daily., Disp: , Rfl:    oxyCODONE (OXY IR/ROXICODONE) 5 MG immediate release tablet, Take 1 tablet (5 mg total) by mouth every 4 (four) hours as needed for severe pain., Disp: 10 tablet, Rfl: 0   oxyCODONE (ROXICODONE) 15 MG immediate release tablet,  Take 15 mg by mouth every 8 (eight) hours., Disp: , Rfl:    polyethylene glycol powder (GLYCOLAX/MIRALAX) 17 GM/SCOOP powder, Take 17 g by mouth daily. Drink 17g (1 scoop) dissolved in water per day., Disp: 255 g, Rfl: 0   zolpidem (AMBIEN) 10 MG tablet, Take 10 mg by mouth at bedtime as needed for sleep., Disp: , Rfl:    Objective AAO x3  Previous Pelvic Exam showed: POP-Q (09/09/21):    POP-Q   0                                            Aa   0                                           Ba   -4.5                                              C    4.5                                            Gh   3.5                                            Pb   10                                            tvl    0                                            Ap   0                                            Bp   -8  D        Assessment/ Plan  Assessment: The patient is a 46 y.o. year old with stage II pelvic organ prolapse scheduled to undergo Exam under anesthesia, sacrospinous hysteropexy, anterior and posterior repair, cystoscopy.   Jaquita Folds, MD

## 2021-12-18 NOTE — Patient Instructions (Signed)

## 2021-12-18 NOTE — Progress Notes (Signed)
[facility] Pre-Operative visit- video visit  Subjective Chief Complaint: Shelly GamblesLinda Lane presents for a preoperative encounter.   History of Present Illness: Shelly GamblesLinda Cumber is a 46 y.o. female who presents for preoperative visit.  She is scheduled to undergo Exam under anesthesia, sacrospinous hysteropexy, anterior and posterior repair, cystoscopy on 12/25/21.  Her symptoms include vaginal bulge, and she was was found to have Stage II anterior, Stage II posterior, Stage I apical prolapse .   Urodynamics showed: Urodynamic Impression:  1. Sensation was normal; capacity was normal 2. Stress Incontinence was not demonstrated. 3. Detrusor Overactivity was not demonstrated. 4. Emptying was normal with a normal PVR, a sustained detrusor contraction present,  abdominal straining present, normal urethral sphincter activity on EMG.    Past Medical History:  Diagnosis Date   Abnormal Pap smear    Colpo;Last pap 2012, ASCUS w/ negative HPV   Advanced maternal age in multigravida 02/13/2013   Anemia    during preg   Anxiety    Diabetes mellitus during pregnancy, antepartum 12/15/2019   Eczema    GERD (gastroesophageal reflux disease)    Gestational diabetes    diet controlled   Gestational hypertension 04/30/2020   H/O migraine    H/O rubella    H/O varicella    H/O: depression    H/O: obesity    Hx: UTI (urinary tract infection)    Opioid use    Preterm labor    Vaginal Pap smear, abnormal      Past Surgical History:  Procedure Laterality Date   CHOLECYSTECTOMY  2008   GASTRIC BYPASS     HERNIA REPAIR  2012   HIATAL HERNIA REPAIR  2012   WISDOM TOOTH EXTRACTION      is allergic to hydrocodone-acetaminophen, hydroxyzine hcl, reglan [metoclopramide], subutex [buprenorphine], and vicodin [hydrocodone-acetaminophen].   Family History  Problem Relation Age of Onset   Diabetes Maternal Grandmother    Hypertension Maternal Grandmother    Anemia Mother      Social History   Tobacco Use   Smoking status: Never   Smokeless tobacco: Never  Vaping Use   Vaping Use: Never used  Substance Use Topics   Alcohol use: No   Drug use: Yes    Types: Oxycodone    Comment: per pt prescribed by physician for chronic back pain     Review of Systems was negative for a full 10 system review except as noted in the History of Present Illness.   Current Outpatient Medications:    buPROPion (WELLBUTRIN SR) 150 MG 12 hr tablet, Take 150 mg by mouth 2 (two) times daily., Disp: , Rfl:    clobetasol ointment (TEMOVATE) 0.05 %, Apply 1 application topically 2 (two) times daily as needed for dry skin., Disp: , Rfl:    cyclobenzaprine (FLEXERIL) 10 MG tablet, Take 10 mg by mouth every 8 (eight) hours., Disp: , Rfl:    dicyclomine (BENTYL) 10 MG capsule, Take 10 mg by mouth 3 (three) times daily as needed., Disp: , Rfl:    Docusate Sodium (DSS) 100 MG CAPS, Take by mouth., Disp: , Rfl:    ELURYNG 0.12-0.015 MG/24HR vaginal ring, Place vaginally., Disp: , Rfl:    escitalopram (LEXAPRO) 10 MG tablet, SMARTSIG:1 Tablet(s) By Mouth Every Evening, Disp: , Rfl:    fluticasone (FLONASE) 50 MCG/ACT nasal spray, Place 2 sprays into both nostrils daily., Disp: , Rfl:    loratadine (CLARITIN) 10 MG tablet, Take 10 mg by mouth daily as needed., Disp: ,  Rfl:    metFORMIN (GLUCOPHAGE) 500 MG tablet, Take 500 mg by mouth daily., Disp: , Rfl:    oxyCODONE (ROXICODONE) 15 MG immediate release tablet, Take 15 mg by mouth every 8 (eight) hours., Disp: , Rfl:    zolpidem (AMBIEN) 10 MG tablet, Take 10 mg by mouth at bedtime as needed for sleep., Disp: , Rfl:    Objective AAO x3  Previous Pelvic Exam showed: POP-Q (09/09/21):    POP-Q   0                                            Aa   0                                           Ba   -4.5                                              C    4.5                                            Gh   3.5                                             Pb   10                                            tvl    0                                            Ap   0                                            Bp   -8                                              D        Assessment/ Plan  Assessment: The patient is a 46 y.o. year old scheduled to undergo Exam under anesthesia, sacrospinous hysteropexy, anterior and posterior repair, cystoscopy. Verbal consent was obtained for these procedures.  Plan: General Surgical Consent: The patient has previously been counseled on alternative treatments, and the decision by the patient and provider was to proceed with the procedure listed above.  For all procedures, there are risks of bleeding, infection, damage to surrounding organs including but not limited to bowel, bladder, blood vessels,  ureters and nerves, and need for further surgery if an injury were to occur. These risks are all low with minimally invasive surgery.   There are risks of numbness and weakness at any body site or buttock/rectal pain.  It is possible that baseline pain can be worsened by surgery, either with or without mesh. If surgery is vaginal, there is also a low risk of possible conversion to laparoscopy or open abdominal incision where indicated. Very rare risks include blood transfusion, blood clot, heart attack, pneumonia, or death.   There is also a risk of short-term postoperative urinary retention with need to use a catheter. About half of patients need to go home from surgery with a catheter, which is then later removed in the office. The risk of long-term need for a catheter is very low. There is also a risk of worsening of overactive bladder.     Prolapse (with or without mesh): Risk factors for surgical failure  include things that put pressure on your pelvis and the surgical repair, including obesity, chronic cough, and heavy lifting or straining (including lifting children or adults, straining  on the toilet, or lifting heavy objects such as furniture or anything weighing >25 lbs. Risks of recurrence is 20-30% with vaginal native tissue repair and a less than 10% with sacrocolpopexy with mesh.     We discussed consent for blood products. Risks for blood transfusion include allergic reactions, other reactions that can affect different body organs and managed accordingly, transmission of infectious diseases such as HIV or Hepatitis. However, the blood is screened. Patient consents for blood products.  Pre-operative instructions:  She was instructed to not take Aspirin/NSAIDs x 7days prior to surgery. Antibiotic prophylaxis was ordered as indicated.  Catheter use: Patient will go home with foley if needed after post-operative voiding trial.  Post-operative instructions:  She was provided with specific post-operative instructions, including precautions and signs/symptoms for which we would recommend contacting us, in addition to daytime and after-hours contact phone numbers. This was provided on a handout.   Post-operative medications: Prescriptions for motrin, tylenol, miralax, and oxycodone were sent to her pharmacy. Discussed using ibuprofen and tylenol on a schedule. She currently takes oxycodone regularly for her back pain, 10 pills were prescribed to cover additional post-operative pain.   Laboratory testing:  No labs needed   Preoperative clearance:  She does not require surgical clearance.    Post-operative follow-up:  A post-operative appointment will be made for 6 weeks from the date of surgery. If she needs a post-operative nurse visit for a voiding trial, that will be set up after she leaves the hospital.    Patient will call the clinic or use MyChart should anything change or any new issues arise.   Marguerita Beards, MD

## 2021-12-19 ENCOUNTER — Encounter (HOSPITAL_BASED_OUTPATIENT_CLINIC_OR_DEPARTMENT_OTHER): Payer: Self-pay | Admitting: Obstetrics and Gynecology

## 2021-12-19 LAB — COMPLETE METABOLIC PANEL WITH GFR
AG Ratio: 1.3 (calc) (ref 1.0–2.5)
ALT: 16 U/L (ref 6–29)
AST: 20 U/L (ref 10–30)
Albumin: 3.7 g/dL (ref 3.6–5.1)
Alkaline phosphatase (APISO): 83 U/L (ref 31–125)
BUN: 8 mg/dL (ref 7–25)
CO2: 21 mmol/L (ref 20–32)
Calcium: 9 mg/dL (ref 8.6–10.2)
Chloride: 106 mmol/L (ref 98–110)
Creat: 0.82 mg/dL (ref 0.50–0.99)
Globulin: 2.8 g/dL (calc) (ref 1.9–3.7)
Glucose, Bld: 73 mg/dL (ref 65–99)
Potassium: 4.2 mmol/L (ref 3.5–5.3)
Sodium: 139 mmol/L (ref 135–146)
Total Bilirubin: 0.5 mg/dL (ref 0.2–1.2)
Total Protein: 6.5 g/dL (ref 6.1–8.1)
eGFR: 91 mL/min/{1.73_m2} (ref >=60)

## 2021-12-19 LAB — LIPID PANEL
Cholesterol: 244 mg/dL — ABNORMAL HIGH (ref <200)
HDL: 74 mg/dL (ref >=50)
LDL Cholesterol (Calc): 148 mg/dL (calc) — ABNORMAL HIGH
Non-HDL Cholesterol (Calc): 170 mg/dL (calc) — ABNORMAL HIGH (ref <130)
Total CHOL/HDL Ratio: 3.3 (calc) (ref <5.0)
Triglycerides: 108 mg/dL (ref <150)

## 2021-12-19 LAB — CBC
HCT: 39.7 % (ref 35.0–45.0)
Hemoglobin: 13.3 g/dL (ref 11.7–15.5)
MCH: 28.5 pg (ref 27.0–33.0)
MCHC: 33.5 g/dL (ref 32.0–36.0)
MCV: 85 fL (ref 80.0–100.0)
MPV: 10.6 fL (ref 7.5–12.5)
Platelets: 327 10*3/uL (ref 140–400)
RBC: 4.67 10*6/uL (ref 3.80–5.10)
RDW: 12.7 % (ref 11.0–15.0)
WBC: 6.6 10*3/uL (ref 3.8–10.8)

## 2021-12-19 LAB — VITAMIN D 25 HYDROXY (VIT D DEFICIENCY, FRACTURES): Vit D, 25-Hydroxy: 78 ng/mL (ref 30–100)

## 2021-12-19 LAB — THYROID PANEL WITH TSH
Free Thyroxine Index: 2.5 (ref 1.4–3.8)
T3 Uptake: 22 % (ref 22–35)
T4, Total: 11.4 ug/dL (ref 5.1–11.9)
TSH: 1.08 mIU/L

## 2021-12-23 ENCOUNTER — Encounter (HOSPITAL_BASED_OUTPATIENT_CLINIC_OR_DEPARTMENT_OTHER): Payer: Self-pay | Admitting: Obstetrics and Gynecology

## 2021-12-23 ENCOUNTER — Other Ambulatory Visit: Payer: Self-pay

## 2021-12-23 NOTE — Progress Notes (Signed)
Spoke w/ via phone for pre-op interview--- pt Lab needs dos----   urine preg            Lab results------ current lab work done 12-18-2021 results in epic, CBC/ [place] test -----patient states asymptomatic no test needed Arrive at ------- 1200 on 12-25-2021 NPO after MN NO Solid Food.  Clear liquids from MN until--- 1100 Med rec completed Medications to take morning of surgery ----- wellbutrin, prilosec,  Diabetic medication ----- do not take metformin morning of surgery Patient instructed no nail polish to be worn day of surgery Patient instructed to bring photo id and insurance card day of surgery Patient aware to have Driver (ride ) / caregiver for 24 hours after surgery --daughter, Alphonsa Overall (age 32) Patient Special Instructions ----- n/a Pre-Op special Istructions ----- n/a Patient verbalized understanding of instructions that were given at this phone interview. Patient denies shortness of breath, chest pain, fever, cough at this phone interview.

## 2021-12-25 ENCOUNTER — Encounter (HOSPITAL_BASED_OUTPATIENT_CLINIC_OR_DEPARTMENT_OTHER): Payer: Self-pay | Admitting: Obstetrics and Gynecology

## 2021-12-25 ENCOUNTER — Ambulatory Visit (HOSPITAL_BASED_OUTPATIENT_CLINIC_OR_DEPARTMENT_OTHER): Payer: Medicaid Other | Admitting: Anesthesiology

## 2021-12-25 ENCOUNTER — Other Ambulatory Visit: Payer: Self-pay

## 2021-12-25 ENCOUNTER — Encounter (HOSPITAL_BASED_OUTPATIENT_CLINIC_OR_DEPARTMENT_OTHER)
Admission: RE | Disposition: A | Discharge status: Home / Self Care | Payer: Self-pay | Source: Ambulatory Visit | Attending: Obstetrics and Gynecology

## 2021-12-25 ENCOUNTER — Ambulatory Visit (HOSPITAL_BASED_OUTPATIENT_CLINIC_OR_DEPARTMENT_OTHER)
Admission: RE | Admit: 2021-12-25 | Discharge: 2021-12-25 | Disposition: A | Discharge status: Home / Self Care | Payer: Medicaid Other | Source: Ambulatory Visit | Attending: Obstetrics and Gynecology | Admitting: Obstetrics and Gynecology

## 2021-12-25 DIAGNOSIS — Z9884 Bariatric surgery status: Secondary | ICD-10-CM | POA: Insufficient documentation

## 2021-12-25 DIAGNOSIS — E669 Obesity, unspecified: Secondary | ICD-10-CM | POA: Diagnosis not present

## 2021-12-25 DIAGNOSIS — E119 Type 2 diabetes mellitus without complications: Secondary | ICD-10-CM | POA: Insufficient documentation

## 2021-12-25 DIAGNOSIS — Z6836 Body mass index (BMI) 36.0-36.9, adult: Secondary | ICD-10-CM | POA: Insufficient documentation

## 2021-12-25 DIAGNOSIS — F32A Depression, unspecified: Secondary | ICD-10-CM | POA: Insufficient documentation

## 2021-12-25 DIAGNOSIS — N8111 Cystocele, midline: Secondary | ICD-10-CM | POA: Diagnosis not present

## 2021-12-25 DIAGNOSIS — M549 Dorsalgia, unspecified: Secondary | ICD-10-CM | POA: Insufficient documentation

## 2021-12-25 DIAGNOSIS — Z7984 Long term (current) use of oral hypoglycemic drugs: Secondary | ICD-10-CM | POA: Diagnosis not present

## 2021-12-25 DIAGNOSIS — N816 Rectocele: Secondary | ICD-10-CM | POA: Diagnosis not present

## 2021-12-25 DIAGNOSIS — N812 Incomplete uterovaginal prolapse: Secondary | ICD-10-CM | POA: Insufficient documentation

## 2021-12-25 DIAGNOSIS — K219 Gastro-esophageal reflux disease without esophagitis: Secondary | ICD-10-CM | POA: Insufficient documentation

## 2021-12-25 DIAGNOSIS — G8929 Other chronic pain: Secondary | ICD-10-CM | POA: Diagnosis not present

## 2021-12-25 DIAGNOSIS — F419 Anxiety disorder, unspecified: Secondary | ICD-10-CM | POA: Diagnosis not present

## 2021-12-25 DIAGNOSIS — Z79899 Other long term (current) drug therapy: Secondary | ICD-10-CM | POA: Insufficient documentation

## 2021-12-25 DIAGNOSIS — N8110 Cystocele, unspecified: Secondary | ICD-10-CM

## 2021-12-25 DIAGNOSIS — N814 Uterovaginal prolapse, unspecified: Secondary | ICD-10-CM

## 2021-12-25 HISTORY — PX: ANTERIOR AND POSTERIOR REPAIR WITH SACROSPINOUS FIXATION: SHX6536

## 2021-12-25 HISTORY — DX: Other chronic pain: G89.29

## 2021-12-25 HISTORY — DX: Migraine, unspecified, not intractable, without status migrainosus: G43.909

## 2021-12-25 HISTORY — DX: Prediabetes: R73.03

## 2021-12-25 HISTORY — DX: Cystocele, unspecified: N81.10

## 2021-12-25 HISTORY — DX: Stress incontinence (female) (male): N39.3

## 2021-12-25 HISTORY — PX: CYSTOSCOPY: SHX5120

## 2021-12-25 HISTORY — DX: Personal history of gestational diabetes: Z86.32

## 2021-12-25 HISTORY — DX: Personal history of other complications of pregnancy, childbirth and the puerperium: Z87.59

## 2021-12-25 HISTORY — DX: Personal history of other diseases of the female genital tract: Z87.42

## 2021-12-25 HISTORY — DX: Irritable bowel syndrome, unspecified: K58.9

## 2021-12-25 LAB — POCT PREGNANCY, URINE: Preg Test, Ur: NEGATIVE

## 2021-12-25 SURGERY — ANTERIOR AND POSTERIOR REPAIR WITH SACROSPINOUS FIXATION
Anesthesia: General | Site: Vagina

## 2021-12-25 MED ORDER — WHITE PETROLATUM EX OINT
TOPICAL_OINTMENT | CUTANEOUS | Status: AC
  Filled 2021-12-25: qty 5

## 2021-12-25 MED ORDER — AMISULPRIDE (ANTIEMETIC) 5 MG/2ML IV SOLN: 10.0000 mg | Freq: Once | INTRAVENOUS | Status: DC | PRN

## 2021-12-25 MED ORDER — MIDAZOLAM HCL 2 MG/2ML IJ SOLN
INTRAMUSCULAR | Status: AC
  Filled 2021-12-25: qty 2

## 2021-12-25 MED ORDER — ONDANSETRON HCL 4 MG/2ML IJ SOLN
INTRAMUSCULAR | Status: DC | PRN
  Administered 2021-12-25: 4 mg via INTRAVENOUS

## 2021-12-25 MED ORDER — DEXAMETHASONE SODIUM PHOSPHATE 10 MG/ML IJ SOLN
INTRAMUSCULAR | Status: AC
  Filled 2021-12-25: qty 1

## 2021-12-25 MED ORDER — SCOPOLAMINE 1 MG/3DAYS TD PT72
1.0000 | MEDICATED_PATCH | TRANSDERMAL | Status: DC
  Administered 2021-12-25: 1.5 mg via TRANSDERMAL

## 2021-12-25 MED ORDER — KETAMINE HCL 50 MG/5ML IJ SOSY
PREFILLED_SYRINGE | INTRAMUSCULAR | Status: AC
  Filled 2021-12-25: qty 5

## 2021-12-25 MED ORDER — DEXAMETHASONE SODIUM PHOSPHATE 4 MG/ML IJ SOLN
INTRAMUSCULAR | Status: DC | PRN
  Administered 2021-12-25: 5 mg via INTRAVENOUS

## 2021-12-25 MED ORDER — MIDAZOLAM HCL 5 MG/5ML IJ SOLN
INTRAMUSCULAR | Status: DC | PRN
  Administered 2021-12-25: 2 mg via INTRAVENOUS

## 2021-12-25 MED ORDER — KETAMINE HCL-SODIUM CHLORIDE 100-0.9 MG/10ML-% IV SOSY
PREFILLED_SYRINGE | INTRAVENOUS | Status: DC | PRN
  Administered 2021-12-25: 30 mg via INTRAVENOUS

## 2021-12-25 MED ORDER — HYDROMORPHONE HCL 1 MG/ML IJ SOLN
INTRAMUSCULAR | Status: AC
  Filled 2021-12-25: qty 1

## 2021-12-25 MED ORDER — DEXMEDETOMIDINE (PRECEDEX) IN NS 20 MCG/5ML (4 MCG/ML) IV SYRINGE
PREFILLED_SYRINGE | INTRAVENOUS | Status: DC | PRN
  Administered 2021-12-25 (×2): 10 ug via INTRAVENOUS

## 2021-12-25 MED ORDER — OXYCODONE HCL 5 MG PO TABS
20.0000 mg | ORAL_TABLET | Freq: Once | ORAL | Status: AC | PRN
  Administered 2021-12-25: 20 mg via ORAL

## 2021-12-25 MED ORDER — PROPOFOL 10 MG/ML IV BOLUS
INTRAVENOUS | Status: AC
  Filled 2021-12-25: qty 20

## 2021-12-25 MED ORDER — SODIUM CHLORIDE 0.9 % IR SOLN
Status: DC | PRN
  Administered 2021-12-25: 1000 mL via INTRAVESICAL

## 2021-12-25 MED ORDER — PROPOFOL 10 MG/ML IV BOLUS
INTRAVENOUS | Status: DC | PRN
Start: 2021-12-25 — End: 2021-12-25
  Administered 2021-12-25: 200 mg via INTRAVENOUS

## 2021-12-25 MED ORDER — KETOROLAC TROMETHAMINE 30 MG/ML IJ SOLN
INTRAMUSCULAR | Status: AC
  Filled 2021-12-25: qty 1

## 2021-12-25 MED ORDER — PHENAZOPYRIDINE HCL 100 MG PO TABS
200.0000 mg | ORAL_TABLET | ORAL | Status: AC
  Administered 2021-12-25: 200 mg via ORAL

## 2021-12-25 MED ORDER — FENTANYL CITRATE (PF) 100 MCG/2ML IJ SOLN
INTRAMUSCULAR | Status: DC | PRN
  Administered 2021-12-25 (×2): 50 ug via INTRAVENOUS

## 2021-12-25 MED ORDER — ACETAMINOPHEN 500 MG PO TABS
ORAL_TABLET | ORAL | Status: AC
  Filled 2021-12-25: qty 2

## 2021-12-25 MED ORDER — LIDOCAINE HCL (CARDIAC) PF 100 MG/5ML IV SOSY
PREFILLED_SYRINGE | INTRAVENOUS | Status: DC | PRN
  Administered 2021-12-25: 60 mg via INTRAVENOUS

## 2021-12-25 MED ORDER — CEFAZOLIN SODIUM-DEXTROSE 2-4 GM/100ML-% IV SOLN
2.0000 g | INTRAVENOUS | Status: AC
  Administered 2021-12-25: 2 g via INTRAVENOUS

## 2021-12-25 MED ORDER — POVIDONE-IODINE 10 % EX SWAB: 2.0000 "application " | Freq: Once | CUTANEOUS | Status: DC

## 2021-12-25 MED ORDER — HYDROMORPHONE HCL 1 MG/ML IJ SOLN
0.5000 mg | INTRAMUSCULAR | Status: DC | PRN
  Administered 2021-12-25 (×2): 0.5 mg via INTRAVENOUS

## 2021-12-25 MED ORDER — ACETAMINOPHEN 500 MG PO TABS
1000.0000 mg | ORAL_TABLET | Freq: Once | ORAL | Status: AC
  Administered 2021-12-25: 1000 mg via ORAL

## 2021-12-25 MED ORDER — DEXMEDETOMIDINE (PRECEDEX) IN NS 20 MCG/5ML (4 MCG/ML) IV SYRINGE
PREFILLED_SYRINGE | INTRAVENOUS | Status: AC
  Filled 2021-12-25: qty 5

## 2021-12-25 MED ORDER — ONDANSETRON HCL 4 MG/2ML IJ SOLN
INTRAMUSCULAR | Status: AC
  Filled 2021-12-25: qty 2

## 2021-12-25 MED ORDER — FENTANYL CITRATE (PF) 100 MCG/2ML IJ SOLN
INTRAMUSCULAR | Status: AC
  Filled 2021-12-25: qty 2

## 2021-12-25 MED ORDER — LACTATED RINGERS IV SOLN: INTRAVENOUS | Status: DC

## 2021-12-25 MED ORDER — LIDOCAINE HCL (PF) 2 % IJ SOLN
INTRAMUSCULAR | Status: AC
  Filled 2021-12-25: qty 5

## 2021-12-25 MED ORDER — SCOPOLAMINE 1 MG/3DAYS TD PT72
MEDICATED_PATCH | TRANSDERMAL | Status: AC
  Filled 2021-12-25: qty 1

## 2021-12-25 MED ORDER — PHENAZOPYRIDINE HCL 100 MG PO TABS
ORAL_TABLET | ORAL | Status: AC
  Filled 2021-12-25: qty 2

## 2021-12-25 MED ORDER — LIDOCAINE-EPINEPHRINE 1 %-1:100000 IJ SOLN
INTRAMUSCULAR | Status: DC | PRN
  Administered 2021-12-25: 20 mL

## 2021-12-25 MED ORDER — OXYCODONE HCL 5 MG PO TABS
ORAL_TABLET | ORAL | Status: AC
  Filled 2021-12-25: qty 4

## 2021-12-25 MED ORDER — CEFAZOLIN SODIUM-DEXTROSE 2-4 GM/100ML-% IV SOLN
INTRAVENOUS | Status: AC
  Filled 2021-12-25: qty 100

## 2021-12-25 MED ORDER — ROCURONIUM BROMIDE 10 MG/ML (PF) SYRINGE
PREFILLED_SYRINGE | INTRAVENOUS | Status: AC
  Filled 2021-12-25: qty 10

## 2021-12-25 MED ORDER — PROMETHAZINE HCL 25 MG/ML IJ SOLN: 6.2500 mg | INTRAMUSCULAR | Status: DC | PRN

## 2021-12-25 MED ORDER — OXYCODONE HCL 5 MG/5ML PO SOLN: 20.0000 mg | Freq: Once | ORAL | Status: AC | PRN

## 2021-12-25 MED ORDER — HEMOSTATIC AGENTS (NO CHARGE) OPTIME
TOPICAL | Status: DC | PRN
Start: 2021-12-25 — End: 2021-12-25
  Administered 2021-12-25: 1 via TOPICAL

## 2021-12-25 SURGICAL SUPPLY — 36 items
AGENT HMST KT MTR STRL THRMB (HEMOSTASIS) ×2
BLADE CLIPPER SENSICLIP SURGIC (BLADE) ×2 IMPLANT
BLADE SURG 15 STRL LF DISP TIS (BLADE) ×2 IMPLANT
BLADE SURG 15 STRL SS (BLADE) ×3
DECANTER SPIKE VIAL GLASS SM (MISCELLANEOUS) IMPLANT
DEVICE CAPIO SLIM SINGLE (INSTRUMENTS) ×1 IMPLANT
DRAPE HYSTEROSCOPY (MISCELLANEOUS) ×1 IMPLANT
ELECT REM PT RETURN 9FT ADLT (ELECTROSURGICAL) ×3
ELECTRODE REM PT RTRN 9FT ADLT (ELECTROSURGICAL) IMPLANT
GAUZE 4X4 16PLY ~~LOC~~+RFID DBL (SPONGE) ×4 IMPLANT
GLOVE SURG ENC MOIS LTX SZ6 (GLOVE) ×3 IMPLANT
GLOVE SURG UNDER POLY LF SZ6.5 (GLOVE) ×3 IMPLANT
GOWN STRL REUS W/TWL LRG LVL3 (GOWN DISPOSABLE) ×4 IMPLANT
HIBICLENS CHG 4% 4OZ BTL (MISCELLANEOUS) ×3 IMPLANT
HOLDER FOLEY CATH W/STRAP (MISCELLANEOUS) ×3 IMPLANT
KIT TURNOVER CYSTO (KITS) ×3 IMPLANT
MANIFOLD NEPTUNE II (INSTRUMENTS) ×3 IMPLANT
NDL MAYO 6 CRC TAPER PT (NEEDLE) IMPLANT
NEEDLE HYPO 22GX1.5 SAFETY (NEEDLE) ×3 IMPLANT
NEEDLE MAYO 6 CRC TAPER PT (NEEDLE) ×3 IMPLANT
NS IRRIG 1000ML POUR BTL (IV SOLUTION) ×3 IMPLANT
PACK CYSTO (CUSTOM PROCEDURE TRAY) ×3 IMPLANT
PACK VAGINAL WOMENS (CUSTOM PROCEDURE TRAY) ×3 IMPLANT
RETRACTOR LONE STAR DISPOSABLE (INSTRUMENTS) ×3 IMPLANT
RETRACTOR STAY HOOK 5MM (MISCELLANEOUS) ×3 IMPLANT
SET IRRIG Y TYPE TUR BLADDER L (SET/KITS/TRAYS/PACK) ×1 IMPLANT
SURGIFLO W/THROMBIN 8M KIT (HEMOSTASIS) ×1 IMPLANT
SUT ABS MONO DBL WITH NDL 48IN (SUTURE) IMPLANT
SUT VIC AB 0 CT1 27 (SUTURE)
SUT VIC AB 0 CT1 27XBRD ANTBC (SUTURE) IMPLANT
SUT VIC AB 2-0 SH 27 (SUTURE)
SUT VIC AB 2-0 SH 27XBRD (SUTURE) IMPLANT
SUT VICRYL 2-0 SH 8X27 (SUTURE) ×4 IMPLANT
SYR BULB EAR ULCER 3OZ GRN STR (SYRINGE) ×3 IMPLANT
TOWEL OR 17X26 10 PK STRL BLUE (TOWEL DISPOSABLE) ×3 IMPLANT
TRAY FOLEY W/BAG SLVR 14FR LF (SET/KITS/TRAYS/PACK) ×3 IMPLANT

## 2021-12-25 NOTE — Op Note (Signed)
Operative Note  Preoperative Diagnosis: anterior vaginal prolapse, posterior vaginal prolapse, and uterovaginal prolapse, incomplete  Postoperative Diagnosis: same  Procedures performed:  Anterior and posterior repair, sacrospinous hysteropexy, cystoscopy  Implants: none  Attending Surgeon: Lanetta Inch, MD  Anesthesia: General LMA  Findings: 1. Stage II pelvic organ prolapse on vaginal exam  2. On cystoscopy, normal bladder and urethra without injury or lesion. Brisk bilateral ureteral efflux noted.    Specimens: none  Estimated blood loss: 200 mL  IV fluids: 1000 mL  Urine output: see flowsheet  Complications: none  Procedure in Detail: After informed consent was obtained, the patient was taken to the operating room where anesthesia was induced and found to be adequate. She was placed in dorsal lithotomy position, taking care to avoid any traction on the extremities, and then prepped and draped in the usual sterile fashion. A self-retaining lonestar retractor was placed using four elastic blue stays.  After a foley catheter was inserted into the urethra, the location of the midurethra was palpated. Two Allis clamps were along the anterior vaginal wall defect. 1% lidocaine with epinephrine was injected into the vaginal mucosa.  A vertical incision was made between these two Allis clamps with a 15 blade scalpel.  Allis clamps were placed along this incision and Metzenbaum scissors were used to undermine the vaginal mucosa along the incision.  The vaginal mucosa was then sharply dissected off to the vesicovaginal septum bilaterally to the level of the pubic rami.    For the sacrospinous ligament fixation (SSLF), the ischial spine was accessed on the right side via dissection with Mayo scissors and blunt dissection.  The sacrospinous ligament was palpated. Two 0 PDS suture was then placed at the sacrospinous ligament two fingerbreadths medial to the ischial spine, in order to avoid  the pudendal neurovascular bundle, using a Capio needle driver.  The PDS suture was attached through the cervix and held. Anterior plication of the vesicovaginal septum was then performed using plicating sutures of 2-0 Vicryl. The Foley catheter was removed.  A 70-degree cystoscope was introduced, and 360-degree inspection revealed no trauma to the bladder, with bilateral ureteral efflux.  The bladder was drained and the cystoscope was removed.  The Foley catheter was reinserted. Surgiflow was placed to help obtain hemostasis and good hemostasis was noted. The vaginal mucosal edges were trimmed and the incision reapproximated with 2-0 Vicryl in a running fashion. The SSLF suture was then tied down with excellent support of the anterior and apical vagina. Cystoscopy was repeated and brisk bilateral efflux was again noted.   Attention was then turned to the posterior vagina.  Two Allis clamps were in the midline of the posterior vaginal wall defect.  1% lidocaine with epinephrine was injected into the vaginal mucosa. A vertical incision was made between these clamps with a 15 blade scalpel.  The rectovaginal septum was then dissected off the vaginal mucosa bilaterally.  No enterocele was noted.  The rectovaginal septum was then plicated with plicating sutures of 2-0 Vicryl.  After placement of the first plication stitch two fingers were inserted into the vaginal to confirm adequate caliber.  The last distal stitch incorporated the perineal body in a U stitch fashion. Surgiflow was placed for hemostasis. After plication, the excess vaginal mucosa was trimmed and the vaginal mucosa was reapproximated using 2-0 Vicryl sutures in a running fashion.  The vagina was copiously irrigated.  Hemostasis was noted.  Vaginal packing was not placed.  A rectal examination was normal and confirmed  no sutures within the rectum.  The patient tolerated the procedure well.  She was awakened from anesthesia and transferred to the  recovery room in stable condition. All counts were correct x 2.     Marguerita Beards, MD

## 2021-12-25 NOTE — Anesthesia Postprocedure Evaluation (Signed)
Anesthesia Post Note  Patient: Shelly Lane  Procedure(s) Performed: ANTERIOR AND POSTERIOR REPAIR WITH SACROSPINOUS FIXATION (Vagina ) CYSTOSCOPY (Bladder)     Patient location during evaluation: PACU Anesthesia Type: General Level of consciousness: sedated Pain management: pain level controlled Vital Signs Assessment: post-procedure vital signs reviewed and stable Respiratory status: spontaneous breathing and respiratory function stable Cardiovascular status: stable Postop Assessment: no apparent nausea or vomiting Anesthetic complications: no   No notable events documented.  Last Vitals:  Vitals:   12/25/21 1645 12/25/21 1659  BP: 126/82 126/69  Pulse: 97 100  Resp: 14 13  Temp:  36.8 C  SpO2: 97% 95%    Last Pain:  Vitals:   12/25/21 1730  TempSrc:   PainSc: 8                  [name] DANIEL

## 2021-12-25 NOTE — Anesthesia Procedure Notes (Signed)
Procedure Name: LMA Insertion Date/Time: 12/25/2021 2:16 PM Performed by: Suan Halter, CRNA Pre-anesthesia Checklist: Patient identified, Emergency Drugs available, Suction available and Patient being monitored Patient Re-evaluated:Patient Re-evaluated prior to induction Oxygen Delivery Method: Circle system utilized Preoxygenation: Pre-oxygenation with 100% oxygen Induction Type: IV induction Ventilation: Mask ventilation without difficulty LMA: LMA inserted LMA Size: 4.0 Number of attempts: 1 Airway Equipment and Method: Bite block Placement Confirmation: positive ETCO2 Tube secured with: Tape Dental Injury: Teeth and Oropharynx as per pre-operative assessment

## 2021-12-25 NOTE — Discharge Instructions (Addendum)

## 2021-12-25 NOTE — Transfer of Care (Signed)
Immediate Anesthesia Transfer of Care Note  Patient: Shelly Lane  Procedure(s) Performed: Procedure(s) (LRB): ANTERIOR AND POSTERIOR REPAIR WITH SACROSPINOUS FIXATION (N/A) CYSTOSCOPY (N/A)  Patient Location: PACU  Anesthesia Type: General  Level of Consciousness: awake, oriented, sedated and patient cooperative  Airway & Oxygen Therapy: Patient Spontanous Breathing and Patient connected to face mask oxygen  Post-op Assessment: Report given to PACU RN and Post -op Vital signs reviewed and stable  Post vital signs: Reviewed and stable  Complications: No apparent anesthesia complications  Last Vitals:  Vitals Value Taken Time  BP 121/71 12/25/21 1615  Temp 36.6 C 12/25/21 1605  Pulse 86 12/25/21 1616  Resp 14 12/25/21 1616  SpO2 99 % 12/25/21 1616  Vitals shown include unvalidated device data.  Last Pain:  Vitals:   12/25/21 1237  TempSrc: Oral  PainSc: 0-No pain      Patients Stated Pain Goal: 4 (12/25/21 1237)  Complications: No notable events documented.

## 2021-12-25 NOTE — Anesthesia Preprocedure Evaluation (Addendum)
Anesthesia Evaluation  Patient identified by MRN, date of birth, ID band Patient awake    Reviewed: Allergy & Precautions, NPO status , Patient's Chart, lab work & pertinent test results  Airway Mallampati: I  TM Distance: >3 FB Neck ROM: Full    Dental no notable dental hx. (+) Dental Advisory Given, Teeth Intact   Pulmonary neg pulmonary ROS,    Pulmonary exam normal breath sounds clear to auscultation       Cardiovascular negative cardio ROS Normal cardiovascular exam Rhythm:Regular Rate:Normal     Neuro/Psych  Headaches, PSYCHIATRIC DISORDERS Anxiety Depression    GI/Hepatic Neg liver ROS, GERD  Controlled,S/p gastric sleeve   Endo/Other  diabetes, Well Controlled, Type 2, Oral Hypoglycemic AgentsObesity BMI 36 a1c 5.4  Renal/GU negative Renal ROS  Female GU complaint     Musculoskeletal Chronic back pain- takes oxy 15mg  four times per day   Abdominal (+) + obese,   Peds  Hematology negative hematology ROS (+) hct 39.7   Anesthesia Other Findings   Reproductive/Obstetrics Grand multip- 11 children                             Anesthesia Physical Anesthesia Plan  ASA: 2  Anesthesia Plan: General   Post-op Pain Management: Tylenol PO (pre-op)   Induction: Intravenous  PONV Risk Score and Plan: 4 or greater and Ondansetron, Dexamethasone, Midazolam, Treatment may vary due to age or medical condition and Scopolamine patch - Pre-op  Airway Management Planned: Oral ETT  Additional Equipment: None  Intra-op Plan:   Post-operative Plan: Extubation in OR  Informed Consent: I have reviewed the patients History and Physical, chart, labs and discussed the procedure including the risks, benefits and alternatives for the proposed anesthesia with the patient or authorized representative who has indicated his/her understanding and acceptance.     Dental advisory given  Plan Discussed  with: CRNA  Anesthesia Plan Comments:        Anesthesia Quick Evaluation

## 2021-12-25 NOTE — Interval H&P Note (Signed)
History and Physical Interval Note:  12/25/2021 1:42 PM  Shelly Lane  has presented today for surgery, with the diagnosis of uterovaginal prolapse incomplete; anterior prolapse; posterior prolapse.  The various methods of treatment have been discussed with the patient and family. After consideration of risks, benefits and other options for treatment, the patient has consented to  Procedure(s): ANTERIOR AND POSTERIOR REPAIR WITH SACROSPINOUS FIXATION (N/A) CYSTOSCOPY (N/A) as a surgical intervention.   Vitals:   12/25/21 1237  BP: 120/88  Pulse: 96  Resp: 18  Temp: 98.6 F (37 C)  SpO2: 99%    Gen: NAD Lungs: normal respirations Abd: soft, nontender    The patient's history has been reviewed, patient examined, no change in status, stable for surgery.  I have reviewed the patient's chart and labs.  Questions were answered to the patient's satisfaction.     Jaquita Folds

## 2021-12-26 ENCOUNTER — Encounter: Payer: Self-pay | Admitting: Obstetrics and Gynecology

## 2021-12-26 ENCOUNTER — Encounter (HOSPITAL_BASED_OUTPATIENT_CLINIC_OR_DEPARTMENT_OTHER): Payer: Self-pay | Admitting: *Deleted

## 2021-12-26 ENCOUNTER — Telehealth: Payer: Self-pay | Admitting: Obstetrics and Gynecology

## 2021-12-26 DIAGNOSIS — M792 Neuralgia and neuritis, unspecified: Secondary | ICD-10-CM

## 2021-12-26 MED ORDER — GABAPENTIN 300 MG PO CAPS: 300.0000 mg | ORAL_CAPSULE | Freq: Two times a day (BID) | ORAL | 1 refills | Status: DC

## 2021-12-26 NOTE — Addendum Note (Signed)
Addendum  created 12/26/21 0949 by Heather Roberts, MD   Attestation recorded in Intraprocedure, Intraprocedure Attestations filed

## 2021-12-26 NOTE — Telephone Encounter (Signed)
Patient called office. Having significant pain in the area of the rectum, buttock and down her legs. She states it "feels like a baby is crowning". She took ibuprofen, tylenol and oxycodone 15mg  about 15 min ago. Prior to that, she had not taken any pain medication since before she went to bed last night. Advised her that she needs to take the ibuprofen and tylenol on an alternating schedule every 3 hours so she has pain medication on board. She can also take an additional 5mg  oxycodone if her pain does not improve with 15mg . Will also prescribe gabapentin 300mg  BID. She has taken this before and tolerated well. She will notify if her pain does not improve.   , MD

## 2021-12-26 NOTE — Telephone Encounter (Signed)
Shelly Lane underwent anterior and posterior repair, sacrospinous ligament fixation, cystoscopy on 12/25/21.   She passed her voiding trial.  338ml was backfilled into the bladder Voided 264ml  PVR by bladder scan was 138ml.   She was discharged without a catheter. Please call her for a routine post op check . Thanks!  Jaquita Folds, MD

## 2021-12-29 ENCOUNTER — Encounter (HOSPITAL_BASED_OUTPATIENT_CLINIC_OR_DEPARTMENT_OTHER): Payer: Self-pay | Admitting: Obstetrics and Gynecology

## 2021-12-30 DIAGNOSIS — G894 Chronic pain syndrome: Secondary | ICD-10-CM | POA: Diagnosis not present

## 2021-12-30 DIAGNOSIS — M545 Low back pain, unspecified: Secondary | ICD-10-CM | POA: Diagnosis not present

## 2021-12-30 DIAGNOSIS — M79662 Pain in left lower leg: Secondary | ICD-10-CM | POA: Diagnosis not present

## 2021-12-30 DIAGNOSIS — M79661 Pain in right lower leg: Secondary | ICD-10-CM | POA: Diagnosis not present

## 2021-12-30 DIAGNOSIS — M25551 Pain in right hip: Secondary | ICD-10-CM | POA: Diagnosis not present

## 2021-12-30 DIAGNOSIS — J3081 Allergic rhinitis due to animal (cat) (dog) hair and dander: Secondary | ICD-10-CM | POA: Diagnosis not present

## 2021-12-30 DIAGNOSIS — J3089 Other allergic rhinitis: Secondary | ICD-10-CM | POA: Diagnosis not present

## 2021-12-30 DIAGNOSIS — J301 Allergic rhinitis due to pollen: Secondary | ICD-10-CM | POA: Diagnosis not present

## 2021-12-30 DIAGNOSIS — M25552 Pain in left hip: Secondary | ICD-10-CM | POA: Diagnosis not present

## 2022-01-02 DIAGNOSIS — M5459 Other low back pain: Secondary | ICD-10-CM | POA: Diagnosis not present

## 2022-01-02 DIAGNOSIS — F419 Anxiety disorder, unspecified: Secondary | ICD-10-CM | POA: Diagnosis not present

## 2022-01-07 DIAGNOSIS — Z419 Encounter for procedure for purposes other than remedying health state, unspecified: Secondary | ICD-10-CM | POA: Diagnosis not present

## 2022-01-19 ENCOUNTER — Encounter: Payer: Self-pay | Admitting: Obstetrics and Gynecology

## 2022-01-20 DIAGNOSIS — Z9884 Bariatric surgery status: Secondary | ICD-10-CM | POA: Diagnosis not present

## 2022-01-20 DIAGNOSIS — E559 Vitamin D deficiency, unspecified: Secondary | ICD-10-CM | POA: Diagnosis not present

## 2022-01-20 DIAGNOSIS — Z903 Acquired absence of stomach [part of]: Secondary | ICD-10-CM | POA: Diagnosis not present

## 2022-01-20 DIAGNOSIS — K912 Postsurgical malabsorption, not elsewhere classified: Secondary | ICD-10-CM | POA: Diagnosis not present

## 2022-01-20 DIAGNOSIS — Z9889 Other specified postprocedural states: Secondary | ICD-10-CM | POA: Diagnosis not present

## 2022-01-20 DIAGNOSIS — M545 Low back pain, unspecified: Secondary | ICD-10-CM | POA: Diagnosis not present

## 2022-01-20 DIAGNOSIS — D563 Thalassemia minor: Secondary | ICD-10-CM | POA: Diagnosis not present

## 2022-01-20 DIAGNOSIS — R7303 Prediabetes: Secondary | ICD-10-CM | POA: Diagnosis not present

## 2022-01-20 DIAGNOSIS — G8929 Other chronic pain: Secondary | ICD-10-CM | POA: Diagnosis not present

## 2022-01-27 DIAGNOSIS — M79661 Pain in right lower leg: Secondary | ICD-10-CM | POA: Diagnosis not present

## 2022-01-27 DIAGNOSIS — M25552 Pain in left hip: Secondary | ICD-10-CM | POA: Diagnosis not present

## 2022-01-27 DIAGNOSIS — J301 Allergic rhinitis due to pollen: Secondary | ICD-10-CM | POA: Diagnosis not present

## 2022-01-27 DIAGNOSIS — J3081 Allergic rhinitis due to animal (cat) (dog) hair and dander: Secondary | ICD-10-CM | POA: Diagnosis not present

## 2022-01-27 DIAGNOSIS — M25551 Pain in right hip: Secondary | ICD-10-CM | POA: Diagnosis not present

## 2022-01-27 DIAGNOSIS — G894 Chronic pain syndrome: Secondary | ICD-10-CM | POA: Diagnosis not present

## 2022-01-27 DIAGNOSIS — M79662 Pain in left lower leg: Secondary | ICD-10-CM | POA: Diagnosis not present

## 2022-01-27 DIAGNOSIS — M545 Low back pain, unspecified: Secondary | ICD-10-CM | POA: Diagnosis not present

## 2022-02-01 ENCOUNTER — Encounter: Payer: Self-pay | Admitting: Obstetrics and Gynecology

## 2022-02-03 ENCOUNTER — Other Ambulatory Visit: Payer: Self-pay | Admitting: Obstetrics and Gynecology

## 2022-02-04 DIAGNOSIS — Z419 Encounter for procedure for purposes other than remedying health state, unspecified: Secondary | ICD-10-CM | POA: Diagnosis not present

## 2022-02-06 ENCOUNTER — Other Ambulatory Visit: Payer: Self-pay

## 2022-02-06 ENCOUNTER — Ambulatory Visit (INDEPENDENT_AMBULATORY_CARE_PROVIDER_SITE_OTHER): Payer: Medicaid Other | Admitting: Obstetrics and Gynecology

## 2022-02-06 ENCOUNTER — Encounter: Payer: Self-pay | Admitting: Obstetrics and Gynecology

## 2022-02-06 VITALS — BP 105/66 | HR 101 | Wt 210.0 lb

## 2022-02-06 DIAGNOSIS — Z9889 Other specified postprocedural states: Secondary | ICD-10-CM

## 2022-02-06 NOTE — Progress Notes (Signed)
[facility] ? ?Date of Visit: 02/06/2022 ? ?History of Present Illness: Ms. Shannahan is a 46 y.o. female scheduled today for a post-operative visit.  ?? Surgery: s/p Anterior and posterior repair, sacrospinous hysteropexy, cystoscopy on 12/25/21 ?? She passed her postoperative void trial.  ?? Postoperative course has been uncomplicated.  ? ?Today she reports she has been having some pain in the vaginal area. No longer having vaginal bleeding. Swelling has improved as well.  ? ?UTI in the last 6 weeks? No  ?Pain? No  ?She has returned to her normal activity (except for postop restrictions) ?Vaginal bulge? No  ?Stress incontinence: No  ?Urgency/frequency: No  ?Urge incontinence: No  ?Voiding dysfunction: No - empties well but sometimes feels she has to push ?Bowel issues: No - can easily have bowel movements now. No longer taking miralax.  ? ?Subjective Success: Do you usually have a bulge or something falling out that you can see or feel in the vaginal area? No  ?Retreatment Success: Any retreatment with surgery or pessary for any compartment? No  ? ?Medications: She has a current medication list which includes the following prescription(s): acetaminophen, bupropion, clobetasol ointment, cyclobenzaprine, dicyclomine, eluryng, escitalopram, fluticasone, gabapentin, ibuprofen, loratadine, oxycodone, oxycodone hcl, and zolpidem.  ? ?Allergies: Patient is allergic to hydrocodone, hydroxyzine hcl, pineapple, reglan [metoclopramide], and subutex [buprenorphine].  ? ?Physical Exam: ?BP 105/66   Pulse (!) 101   Wt 210 lb (95.3 kg)   LMP 01/18/2022   BMI 33.89 kg/m?   ?Abdomen: soft, non-tender, without masses or organomegaly ?Pelvic Examination: Vagina: Incisions healing well. Sutures are present at incision line at the apex. No tenderness along the anterior or posterior vagina. No apical tenderness. No pelvic masses.  ? ?POP-Q: ?POP-Q ? ?-3  ?                                          Aa   ?-3 ?                                           Ba  ?-7.5  ?                                            C  ? ?4  ?                                          Gh  ?3.5  ?                                          Pb  ?8  ?                                          tvl  ? ?-3  ?  Ap  ?-3  ?                                          Bp  ?-8  ?                                            D  ? ? ?--------------------------------------------------------- ? ?Assessment and Plan:  ?1. Post-operative state   ? ?- Healing well.  ?- She has access to her operative report via MyChart.  ?- Can resume regular activity including exercise and intercourse,  if desired.  ?- Discussed avoidance of heavy lifting and straining long term to reduce the risk of recurrence.  ? ?Return as needed. Will need  ? ?No follow-ups on file. ? ?

## 2022-02-11 DIAGNOSIS — Z9889 Other specified postprocedural states: Secondary | ICD-10-CM | POA: Diagnosis not present

## 2022-02-11 DIAGNOSIS — Z9884 Bariatric surgery status: Secondary | ICD-10-CM | POA: Diagnosis not present

## 2022-02-11 DIAGNOSIS — K589 Irritable bowel syndrome without diarrhea: Secondary | ICD-10-CM | POA: Diagnosis not present

## 2022-02-11 DIAGNOSIS — K219 Gastro-esophageal reflux disease without esophagitis: Secondary | ICD-10-CM | POA: Diagnosis not present

## 2022-02-14 DIAGNOSIS — H5213 Myopia, bilateral: Secondary | ICD-10-CM | POA: Diagnosis not present

## 2022-02-20 DIAGNOSIS — M79661 Pain in right lower leg: Secondary | ICD-10-CM | POA: Diagnosis not present

## 2022-02-20 DIAGNOSIS — J301 Allergic rhinitis due to pollen: Secondary | ICD-10-CM | POA: Diagnosis not present

## 2022-02-20 DIAGNOSIS — J3089 Other allergic rhinitis: Secondary | ICD-10-CM | POA: Diagnosis not present

## 2022-02-20 DIAGNOSIS — J3081 Allergic rhinitis due to animal (cat) (dog) hair and dander: Secondary | ICD-10-CM | POA: Diagnosis not present

## 2022-02-20 DIAGNOSIS — M25552 Pain in left hip: Secondary | ICD-10-CM | POA: Diagnosis not present

## 2022-02-20 DIAGNOSIS — M79662 Pain in left lower leg: Secondary | ICD-10-CM | POA: Diagnosis not present

## 2022-02-20 DIAGNOSIS — M25551 Pain in right hip: Secondary | ICD-10-CM | POA: Diagnosis not present

## 2022-02-20 DIAGNOSIS — G894 Chronic pain syndrome: Secondary | ICD-10-CM | POA: Diagnosis not present

## 2022-02-20 DIAGNOSIS — M545 Low back pain, unspecified: Secondary | ICD-10-CM | POA: Diagnosis not present

## 2022-03-07 DIAGNOSIS — Z419 Encounter for procedure for purposes other than remedying health state, unspecified: Secondary | ICD-10-CM | POA: Diagnosis not present

## 2022-03-18 ENCOUNTER — Other Ambulatory Visit (HOSPITAL_COMMUNITY)
Admission: RE | Admit: 2022-03-18 | Discharge: 2022-03-18 | Disposition: A | Discharge status: Home / Self Care | Payer: Medicaid Other | Source: Ambulatory Visit | Attending: Obstetrics and Gynecology | Admitting: Obstetrics and Gynecology

## 2022-03-18 ENCOUNTER — Ambulatory Visit (INDEPENDENT_AMBULATORY_CARE_PROVIDER_SITE_OTHER): Payer: Medicaid Other | Admitting: Obstetrics and Gynecology

## 2022-03-18 ENCOUNTER — Encounter: Payer: Self-pay | Admitting: Obstetrics and Gynecology

## 2022-03-18 VITALS — BP 119/83 | HR 99 | Wt 201.0 lb

## 2022-03-18 DIAGNOSIS — R350 Frequency of micturition: Secondary | ICD-10-CM | POA: Insufficient documentation

## 2022-03-18 DIAGNOSIS — N816 Rectocele: Secondary | ICD-10-CM

## 2022-03-18 NOTE — Progress Notes (Signed)
[facility] ? ?Date of Visit: 03/18/2022 ? ?History of Present Illness: Shelly Lane is a 46 y.o. female scheduled today for a post-operative visit.  ?? Surgery: s/p Anterior and posterior repair, sacrospinous hysteropexy, cystoscopy on 12/25/21 ? ? ?Presents today since she feels some suture that has lengthened from farther up in the vagina. Also feels a slight bulge, but not as much as she did prior to the surgery. Urine has a strong odor since the surgery.  ? ?Medications: She has a current medication list which includes the following prescription(s): acetaminophen, bupropion, clobetasol ointment, cyclobenzaprine, dicyclomine, eluryng, escitalopram, fluticasone, gabapentin, ibuprofen, loratadine, oxycodone, oxycodone hcl, and zolpidem.  ? ?Allergies: Patient is allergic to hydrocodone, hydroxyzine hcl, pineapple, reglan [metoclopramide], and subutex [buprenorphine].  ? ?Physical Exam: ?BP 119/83   Pulse 99   Wt 201 lb (91.2 kg)   BMI 32.44 kg/m?   ? ?Pelvic Examination: Vagina: Well healed. Vaginal ring in place. Normal appearing cervix.  No tenderness along the anterior or posterior vagina. No apical tenderness. Suture initially not seen, but free PDS suture noted and removed.  ? ?POP-Q: ?POP-Q ? ?-3  ?                                          Aa   ?-3 ?                                          Ba  ?-7  ?                                            C  ? ?4  ?                                          Gh  ?3.5  ?                                          Pb  ?9  ?                                          tvl  ? ?-1  ?                                          Ap  ?-1  ?                                          Bp  ?-8  ?  D  ? ?POC urine: trace leukocytes ?--------------------------------------------------------- ? ?Assessment and Plan:  ?1. Urinary frequency   ?2. Prolapse of posterior vaginal wall   ? ? ?- Well healed. Discussed that suture likely dissolved  and there was a remnant remaining.  ?- Some slight recurrence of posterior vaginal wall prolapse, not bothersome to her at this time ?- will send urine for culture ? ?Return as needed.  ? ?Shelly Beards, MD ? ?Time spent: I spent 20 minutes dedicated to the care of this patient on the date of this encounter to include pre-visit review of records, face-to-face time with the patient and post visit documentation. ? ?

## 2022-03-20 DIAGNOSIS — G894 Chronic pain syndrome: Secondary | ICD-10-CM | POA: Diagnosis not present

## 2022-03-20 DIAGNOSIS — M79662 Pain in left lower leg: Secondary | ICD-10-CM | POA: Diagnosis not present

## 2022-03-20 DIAGNOSIS — M25552 Pain in left hip: Secondary | ICD-10-CM | POA: Diagnosis not present

## 2022-03-20 DIAGNOSIS — M79661 Pain in right lower leg: Secondary | ICD-10-CM | POA: Diagnosis not present

## 2022-03-20 DIAGNOSIS — M25551 Pain in right hip: Secondary | ICD-10-CM | POA: Diagnosis not present

## 2022-03-20 DIAGNOSIS — J3089 Other allergic rhinitis: Secondary | ICD-10-CM | POA: Diagnosis not present

## 2022-03-20 DIAGNOSIS — J301 Allergic rhinitis due to pollen: Secondary | ICD-10-CM | POA: Diagnosis not present

## 2022-03-20 DIAGNOSIS — M545 Low back pain, unspecified: Secondary | ICD-10-CM | POA: Diagnosis not present

## 2022-03-20 DIAGNOSIS — J3081 Allergic rhinitis due to animal (cat) (dog) hair and dander: Secondary | ICD-10-CM | POA: Diagnosis not present

## 2022-03-20 LAB — URINE CULTURE: Culture: 80000 — AB

## 2022-03-20 MED ORDER — SULFAMETHOXAZOLE-TRIMETHOPRIM 800-160 MG PO TABS: 1.0000 | ORAL_TABLET | Freq: Two times a day (BID) | ORAL | 0 refills | Status: AC

## 2022-03-20 NOTE — Addendum Note (Signed)
Addended by: Marguerita Beards on: 03/20/2022 11:38 AM ? ? Modules accepted: Orders ? ?

## 2022-03-23 NOTE — Progress Notes (Signed)
4/14- LM on the VM for the patient to call back ?4/17- Left a detailed message re: Medication at the pharmacy for the patient to pick up for recent urine test. ?

## 2022-03-24 ENCOUNTER — Encounter: Payer: Self-pay | Admitting: Obstetrics and Gynecology

## 2022-03-24 NOTE — Progress Notes (Signed)
Pt called back and she was notified ?

## 2022-03-31 ENCOUNTER — Other Ambulatory Visit: Payer: Self-pay | Admitting: Obstetrics and Gynecology

## 2022-03-31 DIAGNOSIS — N3000 Acute cystitis without hematuria: Secondary | ICD-10-CM

## 2022-03-31 MED ORDER — SULFAMETHOXAZOLE-TRIMETHOPRIM 800-160 MG PO TABS: 1.0000 | ORAL_TABLET | Freq: Two times a day (BID) | ORAL | 0 refills | Status: AC

## 2022-04-06 DIAGNOSIS — Z419 Encounter for procedure for purposes other than remedying health state, unspecified: Secondary | ICD-10-CM | POA: Diagnosis not present

## 2022-04-09 DIAGNOSIS — Z885 Allergy status to narcotic agent status: Secondary | ICD-10-CM | POA: Diagnosis not present

## 2022-04-09 DIAGNOSIS — R1012 Left upper quadrant pain: Secondary | ICD-10-CM | POA: Diagnosis not present

## 2022-04-09 DIAGNOSIS — R000 Tachycardia, unspecified: Secondary | ICD-10-CM | POA: Diagnosis not present

## 2022-04-09 DIAGNOSIS — R42 Dizziness and giddiness: Secondary | ICD-10-CM | POA: Diagnosis not present

## 2022-04-09 DIAGNOSIS — Z888 Allergy status to other drugs, medicaments and biological substances status: Secondary | ICD-10-CM | POA: Diagnosis not present

## 2022-04-09 DIAGNOSIS — Z9049 Acquired absence of other specified parts of digestive tract: Secondary | ICD-10-CM | POA: Diagnosis not present

## 2022-04-09 DIAGNOSIS — Z79899 Other long term (current) drug therapy: Secondary | ICD-10-CM | POA: Diagnosis not present

## 2022-04-09 DIAGNOSIS — K6389 Other specified diseases of intestine: Secondary | ICD-10-CM | POA: Diagnosis not present

## 2022-04-12 ENCOUNTER — Encounter: Payer: Self-pay | Admitting: Obstetrics and Gynecology

## 2022-04-13 MED ORDER — FLUCONAZOLE 150 MG PO TABS: 150.0000 mg | ORAL_TABLET | Freq: Once | ORAL | 0 refills | Status: AC

## 2022-04-14 DIAGNOSIS — K269 Duodenal ulcer, unspecified as acute or chronic, without hemorrhage or perforation: Secondary | ICD-10-CM | POA: Diagnosis not present

## 2022-04-14 DIAGNOSIS — R1013 Epigastric pain: Secondary | ICD-10-CM | POA: Diagnosis not present

## 2022-04-14 DIAGNOSIS — K3189 Other diseases of stomach and duodenum: Secondary | ICD-10-CM | POA: Diagnosis not present

## 2022-04-14 DIAGNOSIS — K2950 Unspecified chronic gastritis without bleeding: Secondary | ICD-10-CM | POA: Diagnosis not present

## 2022-04-16 DIAGNOSIS — M25552 Pain in left hip: Secondary | ICD-10-CM | POA: Diagnosis not present

## 2022-04-16 DIAGNOSIS — M545 Low back pain, unspecified: Secondary | ICD-10-CM | POA: Diagnosis not present

## 2022-04-16 DIAGNOSIS — M25551 Pain in right hip: Secondary | ICD-10-CM | POA: Diagnosis not present

## 2022-04-16 DIAGNOSIS — J3081 Allergic rhinitis due to animal (cat) (dog) hair and dander: Secondary | ICD-10-CM | POA: Diagnosis not present

## 2022-04-16 DIAGNOSIS — M79662 Pain in left lower leg: Secondary | ICD-10-CM | POA: Diagnosis not present

## 2022-04-16 DIAGNOSIS — M79661 Pain in right lower leg: Secondary | ICD-10-CM | POA: Diagnosis not present

## 2022-04-16 DIAGNOSIS — J301 Allergic rhinitis due to pollen: Secondary | ICD-10-CM | POA: Diagnosis not present

## 2022-04-16 DIAGNOSIS — J3089 Other allergic rhinitis: Secondary | ICD-10-CM | POA: Diagnosis not present

## 2022-04-16 DIAGNOSIS — G894 Chronic pain syndrome: Secondary | ICD-10-CM | POA: Diagnosis not present

## 2022-04-19 ENCOUNTER — Encounter: Payer: Self-pay | Admitting: Obstetrics and Gynecology

## 2022-04-20 ENCOUNTER — Other Ambulatory Visit (HOSPITAL_COMMUNITY)
Admission: RE | Admit: 2022-04-20 | Discharge: 2022-04-20 | Disposition: A | Discharge status: Home / Self Care | Payer: Medicaid Other | Attending: Obstetrics and Gynecology | Admitting: Obstetrics and Gynecology

## 2022-04-20 ENCOUNTER — Ambulatory Visit (INDEPENDENT_AMBULATORY_CARE_PROVIDER_SITE_OTHER): Payer: Medicaid Other

## 2022-04-20 DIAGNOSIS — R350 Frequency of micturition: Secondary | ICD-10-CM | POA: Diagnosis not present

## 2022-04-20 DIAGNOSIS — N390 Urinary tract infection, site not specified: Secondary | ICD-10-CM | POA: Diagnosis not present

## 2022-04-20 DIAGNOSIS — B9629 Other Escherichia coli [E. coli] as the cause of diseases classified elsewhere: Secondary | ICD-10-CM | POA: Diagnosis not present

## 2022-04-20 DIAGNOSIS — N3000 Acute cystitis without hematuria: Secondary | ICD-10-CM

## 2022-04-20 LAB — POCT URINALYSIS DIPSTICK
Appearance: ABNORMAL
Bilirubin, UA: NEGATIVE
Blood, UA: NEGATIVE
Glucose, UA: NEGATIVE
Ketones, UA: NEGATIVE
Leukocytes, UA: NEGATIVE
Nitrite, UA: POSITIVE
Protein, UA: NEGATIVE
Spec Grav, UA: 1.02 (ref 1.010–1.025)
Urobilinogen, UA: 0.2 E.U./dL
pH, UA: 7 (ref 5.0–8.0)

## 2022-04-20 MED ORDER — NITROFURANTOIN MONOHYD MACRO 100 MG PO CAPS: 100.0000 mg | ORAL_CAPSULE | Freq: Two times a day (BID) | ORAL | 0 refills | Status: DC

## 2022-04-20 MED ORDER — PHENAZOPYRIDINE HCL 200 MG PO TABS: 200.0000 mg | ORAL_TABLET | Freq: Three times a day (TID) | ORAL | 0 refills | Status: AC | PRN

## 2022-04-20 NOTE — Patient Instructions (Signed)
Your Urine dip that was done in office was POSITIVE. ?I am sending the urine off for culture and you can take AZO over the counter for your discomfort.  ?We have also ordered Macrobid 100mg  PO BID and Pyridium 200mg  TID for 3 days for you to take while we wait for your culture results, hopefully this gives you some relief. ?We will contact you when the results are back between 3-5 days. If a different antibiotic is needed we will sent the order to the pharmacy and you will be notified. ?If you have any questions or concerns please feel free to call at [phone]   ?

## 2022-04-20 NOTE — Progress Notes (Signed)
Shelly Lane is a 46 y.o. female arrived today with UTI sx.  ?A urine specimen was collected and POCT Urine was done and urine culture sent to the lab. ?POCT Urine was POSITIVE FOR NITRATES   ?

## 2022-04-22 LAB — URINE CULTURE: Culture: >=100000 — AB

## 2022-04-22 MED ORDER — NITROFURANTOIN MONOHYD MACRO 100 MG PO CAPS: 100.0000 mg | ORAL_CAPSULE | Freq: Two times a day (BID) | ORAL | 0 refills | Status: AC

## 2022-04-22 NOTE — Telephone Encounter (Signed)
-----   Message from Jaquita Folds, MD sent at 04/22/2022 12:13 PM EDT ----- ?Please let patient know her culture returned positive and I will send an antibiotic (macrobid) to the pharmacy.  ?

## 2022-04-22 NOTE — Addendum Note (Signed)
Addended by: Marguerita Beards on: 04/22/2022 12:13 PM ? ? Modules accepted: Orders ? ?

## 2022-04-26 ENCOUNTER — Other Ambulatory Visit: Payer: Self-pay

## 2022-04-26 ENCOUNTER — Emergency Department (HOSPITAL_BASED_OUTPATIENT_CLINIC_OR_DEPARTMENT_OTHER)
Admission: EM | Admit: 2022-04-26 | Discharge: 2022-04-27 | Disposition: A | Discharge status: Home / Self Care | Payer: Medicaid Other | Attending: Emergency Medicine | Admitting: Emergency Medicine

## 2022-04-26 ENCOUNTER — Encounter (HOSPITAL_BASED_OUTPATIENT_CLINIC_OR_DEPARTMENT_OTHER): Payer: Self-pay

## 2022-04-26 DIAGNOSIS — Z8616 Personal history of COVID-19: Secondary | ICD-10-CM | POA: Diagnosis not present

## 2022-04-26 DIAGNOSIS — W208XXA Other cause of strike by thrown, projected or falling object, initial encounter: Secondary | ICD-10-CM | POA: Diagnosis not present

## 2022-04-26 DIAGNOSIS — S99911A Unspecified injury of right ankle, initial encounter: Secondary | ICD-10-CM | POA: Diagnosis present

## 2022-04-26 DIAGNOSIS — S91011A Laceration without foreign body, right ankle, initial encounter: Secondary | ICD-10-CM | POA: Diagnosis not present

## 2022-04-26 DIAGNOSIS — Y92009 Unspecified place in unspecified non-institutional (private) residence as the place of occurrence of the external cause: Secondary | ICD-10-CM | POA: Insufficient documentation

## 2022-04-26 DIAGNOSIS — Z23 Encounter for immunization: Secondary | ICD-10-CM | POA: Insufficient documentation

## 2022-04-26 MED ORDER — TETANUS-DIPHTH-ACELL PERTUSSIS 5-2.5-18.5 LF-MCG/0.5 IM SUSY
0.5000 mL | PREFILLED_SYRINGE | Freq: Once | INTRAMUSCULAR | Status: AC
  Administered 2022-04-26: 0.5 mL via INTRAMUSCULAR
  Filled 2022-04-26: qty 0.5

## 2022-04-26 MED ORDER — LIDOCAINE-EPINEPHRINE (PF) 2 %-1:200000 IJ SOLN
20.0000 mL | Freq: Once | INTRAMUSCULAR | Status: AC
  Administered 2022-04-26: 20 mL
  Filled 2022-04-26: qty 20

## 2022-04-26 NOTE — ED Notes (Signed)
ED Provider at bedside suturing.  

## 2022-04-26 NOTE — ED Notes (Signed)
Laceration to back of rt. Ankle, wound cleaned bleeding controlled

## 2022-04-26 NOTE — ED Triage Notes (Signed)
Arrives EMS from home.   Daughter dropped a vase and it hit back of right ankle. Approx 3cm laceration.   Last tetanus unknown.

## 2022-04-27 ENCOUNTER — Encounter: Payer: Self-pay | Admitting: Obstetrics and Gynecology

## 2022-04-27 NOTE — Discharge Instructions (Addendum)
You were seen today for a laceration.  Have stitches removed in 10 to 14 days.  Keep covered.  Monitor for signs and symptoms of infection.

## 2022-04-27 NOTE — ED Provider Notes (Signed)
[facility]   CSN: [id number] Arrival date & time: 04/26/22  2239     History  Chief Complaint  Patient presents with   Laceration    Shelly Lane is a 46 y.o. female.  HPI     This is a 46 year old female who presents with an injury to the right ankle.  Patient reports that her daughter dropped a vase at home and she sustained a laceration to the posterior aspect of the right ankle.  Last tetanus unknown.  She states it was painful to ambulate.  Home Medications Prior to Admission medications   Medication Sig Start Date End Date Taking? Authorizing Provider  acetaminophen (TYLENOL) 500 MG tablet Take 1 tablet (500 mg total) by mouth every 6 (six) hours as needed (pain). Patient not taking: Reported on 12/19/2021 12/18/21   Marguerita BeardsSchroeder, Michelle N, MD  buPROPion [facility](WELLBUTRIN SR) 150 MG 12 hr tablet Take 150 mg by mouth 2 (two) times daily. 01/11/21   [provider]  clobetasol ointment (TEMOVATE) 0.05 % Apply 1 application topically 2 (two) times daily as needed for dry skin. 04/12/20   [provider]  cyclobenzaprine (FLEXERIL) 10 MG tablet Take 10 mg by mouth 3 (three) times daily as needed. 12/21/20   [provider]  dicyclomine (BENTYL) 10 MG capsule Take 10 mg by mouth 3 (three) times daily as needed. 09/19/21   [provider]  Eulah CitizenELURYNG 0.12-0.015 MG/24HR vaginal ring INSERT VAGINALLY AND LEAVE IN PLACE FOR 3 CONSECUTIVE WEEKS, THEN REMOVE FOR 1 WEEK 02/04/22   Warden FillersBass, Lawrence A, MD  escitalopram (LEXAPRO) 10 MG tablet Take 10 mg by mouth at bedtime. 09/15/21   [provider]  fluticasone (FLONASE) 50 MCG/ACT nasal spray Place 2 sprays into both nostrils daily as needed. 09/22/20   [provider]  gabapentin (NEURONTIN) 300 MG capsule Take 1 capsule (300 mg total) by mouth 2 (two) times daily. 12/26/21   Marguerita BeardsSchroeder, Michelle N, MD  ibuprofen (ADVIL) 600 MG tablet Take 1 tablet (600 mg  total) by mouth every 6 (six) hours as needed. Patient not taking: Reported on 12/19/2021 12/18/21   Marguerita BeardsSchroeder, Michelle N, MD  loratadine (CLARITIN) 10 MG tablet Take 10 mg by mouth daily as needed. 09/22/20   [provider]  nitrofurantoin, macrocrystal-monohydrate, (MACROBID) 100 MG capsule Take 1 capsule (100 mg total) by mouth 2 (two) times daily. 04/20/22   Marguerita BeardsSchroeder, Michelle N, MD  nitrofurantoin, macrocrystal-monohydrate, (MACROBID) 100 MG capsule Take 1 capsule (100 mg total) by mouth 2 (two) times daily for 5 days. 04/22/22 04/27/22  Marguerita BeardsSchroeder, Michelle N, MD  oxyCODONE (OXY IR/ROXICODONE) 5 MG immediate release tablet Take 1 tablet (5 mg total) by mouth every 4 (four) hours as needed for severe pain. Patient not taking: Reported on 12/19/2021 12/18/21   Marguerita BeardsSchroeder, Michelle N, MD  oxyCODONE HCl 15 MG TABA Take 1 tablet by mouth every 6 (six) hours as needed.    [provider]  zolpidem (AMBIEN) 10 MG tablet Take 10 mg by mouth at bedtime as needed for sleep.    [provider]      Allergies    Hydrocodone, Hydroxyzine hcl, Pineapple, Reglan [metoclopramide], and Subutex [buprenorphine]    Review of Systems   Review of Systems  Skin:  Positive for wound.  All other systems reviewed and are negative.  Physical Exam Updated Vital Signs BP 115/83   Pulse 96   Temp 98.6 F (37 C) (Oral)   Resp 18  SpO2 99%  Physical Exam Vitals and nursing note reviewed.  Constitutional:      Appearance: She is well-developed. She is obese. She is not ill-appearing.  HENT:     Head: Normocephalic and atraumatic.  Eyes:     Pupils: Pupils are equal, round, and reactive to light.  Cardiovascular:     Rate and Rhythm: Normal rate and regular rhythm.  Pulmonary:     Effort: Pulmonary effort is normal. No respiratory distress.  Abdominal:     Palpations: Abdomen is soft.  Musculoskeletal:     Cervical back: Neck supple.     Comments: Thompson test intact  Skin:     General: Skin is warm and dry.     Comments: 3 cm laceration posterior ankle at the Achilles, tendon exposed but visually and functionally intact  Neurological:     Mental Status: She is alert and oriented to person, place, and time.  Psychiatric:        Mood and Affect: Mood normal.    ED Results / Procedures / Treatments   Labs (all labs ordered are listed, but only abnormal results are displayed) Labs Reviewed - No data to display  EKG None  Radiology No results found.  Procedures .Marland KitchenLaceration Repair  Date/Time: 04/27/2022 12:04 AM Performed by: Shon Baton, MD Authorized by: Shon Baton, MD   Consent:    Consent obtained:  Verbal   Consent given by:  Patient   Risks discussed:  Infection, pain and tendon damage   Alternatives discussed:  No treatment Laceration details:    Location:  Leg   Leg location:  R lower leg   Length (cm):  3   Depth (mm):  3 Pre-procedure details:    Preparation:  Patient was prepped and draped in usual sterile fashion Exploration:    Limited defect created (wound extended): no     Imaging outcome: foreign body not noted     Wound exploration: wound explored through full range of motion     Wound extent: no tendon damage noted, no underlying fracture noted and no vascular damage noted     Contaminated: no   Treatment:    Area cleansed with:  Povidone-iodine and saline   Amount of cleaning:  Standard   Irrigation solution:  Sterile saline   Irrigation method:  Pressure wash   Visualized foreign bodies/material removed: no     Debridement:  None   Undermining:  None Skin repair:    Repair method:  Sutures   Suture size:  4-0   Suture material:  Nylon   Suture technique:  Simple interrupted   Number of sutures:  5 Approximation:    Approximation:  Close Repair type:    Repair type:  Simple Post-procedure details:    Dressing:  Open (no dressing)   Procedure completion:  Tolerated    Medications Ordered in  ED Medications  Tdap (BOOSTRIX) injection 0.5 mL (0.5 mLs Intramuscular Given 04/26/22 2310)  lidocaine-EPINEPHrine (XYLOCAINE W/EPI) 2 %-1:200000 (PF) injection 20 mL (20 mLs Infiltration Given 04/26/22 2316)    ED Course/ Medical Decision Making/ A&P                           Medical Decision Making Risk Prescription drug management.   This patient presents to the ED for concern of laceration, this involves an extensive number of treatment options, and is a complaint that carries with it a high risk of complications  and morbidity.  I considered the following differential and admission for this acute, potentially life threatening condition.  The differential diagnosis includes laceration, tendon laceration  MDM:    This is a 46 year old female who presents with a laceration over the posterior ankle.  Achilles tendon is exposed but functionally and visually it appears fully intact.  No other obvious injury.  No foreign body.  No indication for x-rays.  Laceration repaired at the bedside.  Recommend suture removal in 10 to 14 days.  Tetanus was updated.  (Labs, imaging, consults)  Labs: I Ordered, and personally interpreted labs.  The pertinent results include: None  Imaging Studies ordered: I ordered imaging studies including none I independently visualized and interpreted imaging. I agree with the radiologist interpretation  Additional history obtained from chart review.  External records from outside source obtained and reviewed including evaluation  Cardiac Monitoring: The patient was maintained on a cardiac monitor.  I personally viewed and interpreted the cardiac monitored which showed an underlying rhythm of: Sinus rhythm  Reevaluation: After the interventions noted above, I reevaluated the patient and found that they have :improved  Social Determinants of Health: Lives independently  Disposition: Discharge  Co morbidities that complicate the patient evaluation  Past  Medical History:  Diagnosis Date   Anxiety    Chronic back pain    followed by pain clinic   Eczema    GERD (gastroesophageal reflux disease)    History of COVID-19 11/2020   per pt covid pneumonia, recovered at home, resolved   History of gestational diabetes    Hx of abnormal cervical Pap smear    Colpo;Last pap 2012, ASCUS w/ negative HPV   IBS (irritable bowel syndrome)    Migraines    takes ibprofen as needed per pt   Pre-diabetes    followed by pcp  (12-23-2021 per pt checks blood sugar if has symptoms)   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   SUI (stress urinary incontinence, female)    Vaginal wall prolapse      Medicines Meds ordered this encounter  Medications   Tdap (BOOSTRIX) injection 0.5 mL   lidocaine-EPINEPHrine (XYLOCAINE W/EPI) 2 %-1:200000 (PF) injection 20 mL    I have reviewed the patients home medicines and have made adjustments as needed  Problem List / ED Course: Problem List Items Addressed This Visit   None Visit Diagnoses     Laceration of right ankle, initial encounter    -  Primary                   Final Clinical Impression(s) / ED Diagnoses Final diagnoses:  Laceration of right ankle, initial encounter    Rx / DC Orders ED Discharge Orders     None         Wilkie Aye, Mayer Masker, MD 04/27/22 [phone]

## 2022-04-28 NOTE — Telephone Encounter (Signed)
Pt has been scheduled.  °

## 2022-04-30 ENCOUNTER — Encounter: Payer: Self-pay | Admitting: Obstetrics and Gynecology

## 2022-04-30 ENCOUNTER — Ambulatory Visit (INDEPENDENT_AMBULATORY_CARE_PROVIDER_SITE_OTHER): Payer: Medicaid Other | Admitting: Obstetrics and Gynecology

## 2022-04-30 ENCOUNTER — Other Ambulatory Visit (HOSPITAL_COMMUNITY)
Admission: RE | Admit: 2022-04-30 | Discharge: 2022-04-30 | Disposition: A | Discharge status: Home / Self Care | Payer: Medicaid Other | Source: Ambulatory Visit | Attending: Obstetrics and Gynecology | Admitting: Obstetrics and Gynecology

## 2022-04-30 VITALS — BP 111/81 | HR 98

## 2022-04-30 DIAGNOSIS — N898 Other specified noninflammatory disorders of vagina: Secondary | ICD-10-CM

## 2022-04-30 DIAGNOSIS — B379 Candidiasis, unspecified: Secondary | ICD-10-CM | POA: Diagnosis not present

## 2022-04-30 DIAGNOSIS — R8290 Unspecified abnormal findings in urine: Secondary | ICD-10-CM | POA: Diagnosis not present

## 2022-04-30 MED ORDER — METHENAMINE HIPPURATE 1 G PO TABS: 1.0000 g | ORAL_TABLET | Freq: Two times a day (BID) | ORAL | 11 refills | Status: DC

## 2022-04-30 NOTE — Patient Instructions (Addendum)
To prevent infection, take cranberry tablets (recommend brand Ellura) and D-mannose pills daily.

## 2022-04-30 NOTE — Progress Notes (Signed)
[facility] Return Visit  SUBJECTIVE  History of Present Illness: Shelly Lane is a 46 y.o. female seen in follow-up for urinary tract infection.   Denies burning and urgency with urination. She is most concerned about the smell of her urine- usually has a bad odor especially in the morning. It goes away for a few days after antibiotics and then returns.  s/p Anterior and posterior repair, sacrospinous hysteropexy, cystoscopy on 12/25/21    Past Medical History: Patient  has a past medical history of Anxiety, Chronic back pain, Eczema, GERD (gastroesophageal reflux disease), History of COVID-19 (11/2020), History of gestational diabetes, abnormal cervical Pap smear, IBS (irritable bowel syndrome), Migraines, Pre-diabetes, S/P gastric sleeve procedure (07/14/2021), SUI (stress urinary incontinence, female), and Vaginal wall prolapse.   Past Surgical History: She  has a past surgical history that includes Wisdom tooth extraction; Laparoscopic Nissen fundoplication (04/16/2011); Cholecystectomy, laparoscopic (08/16/2007); Laparoscopic gastric sleeve resection (07/14/2021); Anterior and posterior repair with sacrospinous fixation (N/A, 12/25/2021); and Cystoscopy (N/A, 12/25/2021).   Medications: She has a current medication list which includes the following prescription(s): methenamine, acetaminophen, bupropion, clobetasol ointment, cyclobenzaprine, dicyclomine, eluryng, escitalopram, fluticasone, gabapentin, ibuprofen, loratadine, oxycodone, oxycodone hcl, and zolpidem.   Allergies: Patient is allergic to hydrocodone, hydroxyzine hcl, pineapple, reglan [metoclopramide], and subutex [buprenorphine].   Social History: Patient  reports that she has never smoked. She has never used smokeless tobacco. She reports that she does not drink alcohol and does not use drugs.      OBJECTIVE     Physical Exam: Vitals:   04/30/22 1304  BP: 111/81  Pulse: 98   Gen: No apparent  distress, A&O x 3.  Detailed Urogynecologic Evaluation:  Speculum exam reveals no lesions in the vagina, normal mucosa and normal appearing cervix. Aptima swab obtained  Rectal exam has good sphincter tone. No evidence of fistula.    POC urine: negative  ASSESSMENT AND PLAN    Shelly Lane is a 46 y.o. with:  1. Abnormal urine odor   2. Vaginal odor    - We discussed that the UA today does not show any evidence of infection. It is possible that the bladder is colonized with bacteria, in which case we would not necessarily treat unless she had symptoms of a urinary tract infection, which she denies today.  - Since she has had two positive cultures in the last 6 months, we discussed using prophylactic antibiotics for prevention of infection, but she declines today.  - We reviewed alternative options to antibiotics including methenamine, D-mannose and cranberry supplements. She will try the supplements and prescription for methenamine sent to pharmacy. - Aptima swab also sent to ensure she does not have a vaginal infection.   Return 2 months for follow up  Marguerita Beards, MD

## 2022-05-05 LAB — CERVICOVAGINAL ANCILLARY ONLY
Bacterial Vaginitis (gardnerella): NEGATIVE
Candida Glabrata: NEGATIVE
Candida Vaginitis: POSITIVE — AB
Comment: NEGATIVE
Comment: NEGATIVE
Comment: NEGATIVE

## 2022-05-06 MED ORDER — FLUCONAZOLE 150 MG PO TABS: 150.0000 mg | ORAL_TABLET | Freq: Once | ORAL | 1 refills | Status: DC | PRN

## 2022-05-06 NOTE — Addendum Note (Signed)
Addended by: Marguerita Beards on: 05/06/2022 02:36 PM   Modules accepted: Orders

## 2022-05-07 DIAGNOSIS — Z419 Encounter for procedure for purposes other than remedying health state, unspecified: Secondary | ICD-10-CM | POA: Diagnosis not present

## 2022-05-08 DIAGNOSIS — S59802A Other specified injuries of left elbow, initial encounter: Secondary | ICD-10-CM | POA: Diagnosis not present

## 2022-05-08 DIAGNOSIS — K219 Gastro-esophageal reflux disease without esophagitis: Secondary | ICD-10-CM | POA: Diagnosis not present

## 2022-05-08 DIAGNOSIS — J302 Other seasonal allergic rhinitis: Secondary | ICD-10-CM | POA: Diagnosis not present

## 2022-05-08 DIAGNOSIS — B355 Tinea imbricata: Secondary | ICD-10-CM | POA: Diagnosis not present

## 2022-05-11 ENCOUNTER — Emergency Department (HOSPITAL_BASED_OUTPATIENT_CLINIC_OR_DEPARTMENT_OTHER)
Admission: EM | Admit: 2022-05-11 | Discharge: 2022-05-11 | Disposition: A | Discharge status: Home / Self Care | Payer: Medicaid Other | Attending: Emergency Medicine | Admitting: Emergency Medicine

## 2022-05-11 ENCOUNTER — Emergency Department (HOSPITAL_BASED_OUTPATIENT_CLINIC_OR_DEPARTMENT_OTHER): Payer: Medicaid Other

## 2022-05-11 ENCOUNTER — Encounter (HOSPITAL_BASED_OUTPATIENT_CLINIC_OR_DEPARTMENT_OTHER): Payer: Self-pay

## 2022-05-11 DIAGNOSIS — Z79899 Other long term (current) drug therapy: Secondary | ICD-10-CM | POA: Insufficient documentation

## 2022-05-11 DIAGNOSIS — R55 Syncope and collapse: Secondary | ICD-10-CM | POA: Insufficient documentation

## 2022-05-11 DIAGNOSIS — R42 Dizziness and giddiness: Secondary | ICD-10-CM | POA: Diagnosis not present

## 2022-05-11 DIAGNOSIS — R000 Tachycardia, unspecified: Secondary | ICD-10-CM | POA: Diagnosis not present

## 2022-05-11 DIAGNOSIS — Z8616 Personal history of COVID-19: Secondary | ICD-10-CM | POA: Diagnosis not present

## 2022-05-11 DIAGNOSIS — R7989 Other specified abnormal findings of blood chemistry: Secondary | ICD-10-CM | POA: Diagnosis not present

## 2022-05-11 DIAGNOSIS — M79602 Pain in left arm: Secondary | ICD-10-CM | POA: Diagnosis not present

## 2022-05-11 LAB — COMPREHENSIVE METABOLIC PANEL
ALT: 14 U/L (ref 0–44)
AST: 16 U/L (ref 15–41)
Albumin: 3.7 g/dL (ref 3.5–5.0)
Alkaline Phosphatase: 88 U/L (ref 38–126)
Anion gap: 10 (ref 5–15)
BUN: 11 mg/dL (ref 6–20)
CO2: 24 mmol/L (ref 22–32)
Calcium: 9.1 mg/dL (ref 8.9–10.3)
Chloride: 103 mmol/L (ref 98–111)
Creatinine, Ser: 0.95 mg/dL (ref 0.44–1.00)
GFR, Estimated: >60 mL/min (ref >60)
Glucose, Bld: 58 mg/dL — ABNORMAL LOW (ref 70–99)
Potassium: 3.4 mmol/L — ABNORMAL LOW (ref 3.5–5.1)
Sodium: 137 mmol/L (ref 135–145)
Total Bilirubin: 0.4 mg/dL (ref 0.3–1.2)
Total Protein: 7.2 g/dL (ref 6.5–8.1)

## 2022-05-11 LAB — CBC WITH DIFFERENTIAL/PLATELET
Abs Immature Granulocytes: 0.02 10*3/uL (ref 0.00–0.07)
Basophils Absolute: 0.1 10*3/uL (ref 0.0–0.1)
Basophils Relative: 1 %
Eosinophils Absolute: 0.2 10*3/uL (ref 0.0–0.5)
Eosinophils Relative: 2 %
HCT: 39.5 % (ref 36.0–46.0)
Hemoglobin: 12.5 g/dL (ref 12.0–15.0)
Immature Granulocytes: 0 %
Lymphocytes Relative: 33 %
Lymphs Abs: 3.3 10*3/uL (ref 0.7–4.0)
MCH: 27.7 pg (ref 26.0–34.0)
MCHC: 31.6 g/dL (ref 30.0–36.0)
MCV: 87.6 fL (ref 80.0–100.0)
Monocytes Absolute: 0.8 10*3/uL (ref 0.1–1.0)
Monocytes Relative: 8 %
Neutro Abs: 5.7 10*3/uL (ref 1.7–7.7)
Neutrophils Relative %: 56 %
Platelets: 382 10*3/uL (ref 150–400)
RBC: 4.51 MIL/uL (ref 3.87–5.11)
RDW: 13.6 % (ref 11.5–15.5)
WBC: 10 10*3/uL (ref 4.0–10.5)
nRBC: 0 % (ref 0.0–0.2)

## 2022-05-11 LAB — TROPONIN I (HIGH SENSITIVITY)
Troponin I (High Sensitivity): 3 ng/L (ref <18)
Troponin I (High Sensitivity): 2 ng/L (ref <18)

## 2022-05-11 LAB — D-DIMER, QUANTITATIVE: D-Dimer, Quant: 2.54 ug/mL-FEU — ABNORMAL HIGH (ref 0.00–0.50)

## 2022-05-11 LAB — CBG MONITORING, ED: Glucose-Capillary: 61 mg/dL — ABNORMAL LOW (ref 70–99)

## 2022-05-11 LAB — HCG, SERUM, QUALITATIVE: Preg, Serum: NEGATIVE

## 2022-05-11 MED ORDER — IOHEXOL 350 MG/ML SOLN
75.0000 mL | Freq: Once | INTRAVENOUS | Status: AC | PRN
  Administered 2022-05-11: 79 mL via INTRAVENOUS

## 2022-05-11 MED ORDER — LORAZEPAM 2 MG/ML IJ SOLN
1.0000 mg | Freq: Once | INTRAMUSCULAR | Status: AC
Start: 2022-05-11 — End: 2022-05-11
  Administered 2022-05-11: 1 mg via INTRAVENOUS
  Filled 2022-05-11: qty 1

## 2022-05-11 MED ORDER — SODIUM CHLORIDE 0.9 % IV BOLUS
1000.0000 mL | Freq: Once | INTRAVENOUS | Status: AC
  Administered 2022-05-11: 1000 mL via INTRAVENOUS

## 2022-05-11 NOTE — ED Provider Notes (Signed)
[facility] EMERGENCY DEPT Provider Note   CSN: [id number] Arrival date & time: 05/11/22  0013     History  Chief Complaint  Patient presents with   Loss of Consciousness    Shelly Lane is a 46 y.o. female.  HPI     This is a 46 year old female who presents after "passing out."  Patient reports that she passed out around 9 PM.  She has a history of dizziness and has passed out multiple times since having gastric bypass surgery.  She reports hitting her left arm.  She has had pain in her left elbow.  She states that prior to this episode tonight she felt like she had a panic attack.  She was hyperventilating.  She denies chest pain or shortness of breath.  No recent fevers or illnesses.  She states that prior to passing out she felt like she was dizzy and lightheaded.  No history of cardiac arrhythmia.  Reports that she has not eaten much today.  Home Medications Prior to Admission medications   Medication Sig Start Date End Date Taking? Authorizing Provider  acetaminophen (TYLENOL) 500 MG tablet Take 1 tablet (500 mg total) by mouth every 6 (six) hours as needed (pain). Patient not taking: Reported on 12/19/2021 12/18/21   Marguerita Beards, MD  buPROPion [facility] SR) 150 MG 12 hr tablet Take 150 mg by mouth 2 (two) times daily. 01/11/21   [provider]  clobetasol ointment (TEMOVATE) 0.05 % Apply 1 application topically 2 (two) times daily as needed for dry skin. 04/12/20   [provider]  cyclobenzaprine (FLEXERIL) 10 MG tablet Take 10 mg by mouth 3 (three) times daily as needed. 12/21/20   [provider]  dicyclomine (BENTYL) 10 MG capsule Take 10 mg by mouth 3 (three) times daily as needed. 09/19/21   [provider]  Eulah Citizen 0.12-0.015 MG/24HR vaginal ring INSERT VAGINALLY AND LEAVE IN PLACE FOR 3 CONSECUTIVE WEEKS, THEN REMOVE FOR 1 WEEK 02/04/22   Warden Fillers, MD  escitalopram (LEXAPRO) 10 MG tablet Take 10 mg by  mouth at bedtime. 09/15/21   [provider]  fluconazole (DIFLUCAN) 150 MG tablet Take 1 tablet (150 mg total) by mouth once as needed for up to 1 dose (yeast infection). 05/06/22   Marguerita Beards, MD  fluticasone [facility]) 50 MCG/ACT nasal spray Place 2 sprays into both nostrils daily as needed. 09/22/20   [provider]  gabapentin (NEURONTIN) 300 MG capsule Take 1 capsule (300 mg total) by mouth 2 (two) times daily. 12/26/21   Marguerita Beards, MD  ibuprofen (ADVIL) 600 MG tablet Take 1 tablet (600 mg total) by mouth every 6 (six) hours as needed. Patient not taking: Reported on 12/19/2021 12/18/21   Marguerita Beards, MD  loratadine (CLARITIN) 10 MG tablet Take 10 mg by mouth daily as needed. 09/22/20   [provider]  methenamine (HIPREX) 1 g tablet Take 1 tablet (1 g total) by mouth 2 (two) times daily with a meal. 04/30/22   Marguerita Beards, MD  oxyCODONE (OXY IR/ROXICODONE) 5 MG immediate release tablet Take 1 tablet (5 mg total) by mouth every 4 (four) hours as needed for severe pain. Patient not taking: Reported on 12/19/2021 12/18/21   Marguerita Beards, MD  oxyCODONE HCl 15 MG TABA Take 1 tablet by mouth every 6 (six) hours as needed.    [provider]  zolpidem (AMBIEN) 10 MG tablet Take 10 mg by mouth at bedtime  as needed for sleep.    [provider]      Allergies    Hydrocodone, Hydroxyzine hcl, Pineapple, Reglan [metoclopramide], and Subutex [buprenorphine]    Review of Systems   Review of Systems  Neurological:  Positive for dizziness and syncope.  All other systems reviewed and are negative.  Physical Exam Updated Vital Signs BP (!) 121/97   Pulse 92   Temp 97.9 F (36.6 C) (Oral)   Resp 16   Ht 1.676 m (5\' 6" )   Wt 88 kg   LMP 05/02/2022 (Exact Date)   SpO2 100%   BMI 31.31 kg/m  Physical Exam Vitals and nursing note reviewed.  Constitutional:      Appearance: She is well-developed. She is  obese. She is not ill-appearing.  HENT:     Head: Normocephalic and atraumatic.     Nose: Nose normal.     Mouth/Throat:     Mouth: Mucous membranes are moist.  Eyes:     Pupils: Pupils are equal, round, and reactive to light.  Cardiovascular:     Rate and Rhythm: Regular rhythm. Tachycardia present.     Heart sounds: Normal heart sounds.  Pulmonary:     Effort: Pulmonary effort is normal. No respiratory distress.     Breath sounds: No wheezing.  Abdominal:     Palpations: Abdomen is soft.     Tenderness: There is no abdominal tenderness.  Musculoskeletal:     Cervical back: Neck supple.     Comments: Normal range of motion left elbow, no obvious deformities  Skin:    General: Skin is warm and dry.  Neurological:     Mental Status: She is alert and oriented to person, place, and time.     Comments: 5 of 5 strength in all 4 extremities, no dysmetria to finger-nose-finger  Psychiatric:        Mood and Affect: Mood normal.    ED Results / Procedures / Treatments   Labs (all labs ordered are listed, but only abnormal results are displayed) Labs Reviewed  COMPREHENSIVE METABOLIC PANEL - Abnormal; Notable for the following components:      Result Value   Potassium 3.4 (*)    Glucose, Bld 58 (*)    All other components within normal limits  D-DIMER, QUANTITATIVE - Abnormal; Notable for the following components:   D-Dimer, Quant 2.54 (*)    All other components within normal limits  CBG MONITORING, ED - Abnormal; Notable for the following components:   Glucose-Capillary 61 (*)    All other components within normal limits  CBC WITH DIFFERENTIAL/PLATELET  HCG, SERUM, QUALITATIVE  TROPONIN I (HIGH SENSITIVITY)  TROPONIN I (HIGH SENSITIVITY)    EKG EKG Interpretation  Date/Time:  Monday May 11 2022 00:59:22 EDT Ventricular Rate:  128 PR Interval:  166 QRS Duration: 82 QT Interval:  299 QTC Calculation: 437 R Axis:   61 Text Interpretation: Sinus tachycardia Borderline  T abnormalities, inferior leads Confirmed by Ross MarcusHorton, Linetta Regner ([id number]) on 05/11/2022 1:59:30 AM  Radiology DG Elbow Complete Left  Result Date: 05/11/2022 CLINICAL DATA:  Fall with left arm pain. EXAM: LEFT ELBOW - COMPLETE 3+ VIEW COMPARISON:  None Available. FINDINGS: There is no evidence of fracture, dislocation, or joint effusion. Normal joint spaces and alignment. There is no evidence of arthropathy or other focal bone abnormality. Soft tissues are unremarkable. IMPRESSION: Negative radiographs of the left elbow. Electronically Signed   By: Narda RutherfordMelanie  Sanford M.D.   On: 05/11/2022 01:37  CT Angio Chest PE W and/or Wo Contrast  Result Date: 05/11/2022 CLINICAL DATA:  Positive D-dimer syncope EXAM: CT ANGIOGRAPHY CHEST WITH CONTRAST TECHNIQUE: Multidetector CT imaging of the chest was performed using the standard protocol during bolus administration of intravenous contrast. Multiplanar CT image reconstructions and MIPs were obtained to evaluate the vascular anatomy. RADIATION DOSE REDUCTION: This exam was performed according to the departmental dose-optimization program which includes automated exposure control, adjustment of the mA and/or kV according to patient size and/or use of iterative reconstruction technique. CONTRAST:  29mL OMNIPAQUE IOHEXOL 350 MG/ML SOLN COMPARISON:  None Available. FINDINGS: Cardiovascular: Satisfactory opacification of the pulmonary arteries to the segmental level. No evidence of pulmonary embolism. Normal heart size. No pericardial effusion. Nonaneurysmal aorta. No dissection seen. Mediastinum/Nodes: No enlarged mediastinal, hilar, or axillary lymph nodes. Thyroid gland, trachea, and esophagus demonstrate no significant findings. Lungs/Pleura: Lungs are clear. No pleural effusion or pneumothorax. Upper Abdomen: Status post gastric bypass surgery. No acute abnormality. Musculoskeletal: No chest wall abnormality. No acute or significant osseous findings. Review of the MIP images  confirms the above findings. IMPRESSION: 1. Negative for acute pulmonary embolus or aortic dissection. 2. Clear lung fields. Electronically Signed   By: Jasmine Pang M.D.   On: 05/11/2022 03:36    Procedures Procedures    Medications Ordered in ED Medications  sodium chloride 0.9 % bolus 1,000 mL (1,000 mLs Intravenous New Bag/Given 05/11/22 0128)  LORazepam (ATIVAN) injection 1 mg (1 mg Intravenous Given 05/11/22 0128)  iohexol (OMNIPAQUE) 350 MG/ML injection 75 mL (79 mLs Intravenous Contrast Given 05/11/22 0322)    ED Course/ Medical Decision Making/ A&P                           Medical Decision Making Amount and/or Complexity of Data Reviewed Labs: ordered. Radiology: ordered.  Risk Prescription drug management.   This patient presents to the ED for concern of Syncope, this involves an extensive number of treatment options, and is a complaint that carries with it a high risk of complications and morbidity.  I considered the following differential and admission for this acute, potentially life threatening condition.  The differential diagnosis includes vasovagal syncope, orthostasis, arrhythmia, hypoglycemia  MDM:    Shelly Lane is a 46 year old female who presents with an episode of syncope.  She is nontoxic.  Vital signs notable initially for heart rate of 142.  It appears to be sinus tachycardia.  No evidence of arrhythmia.  She was given fluids and Ativan.  She reports anxiety and panic.  She is not technically orthostatic although she does increase her heart rate by 19 with standing.  Labs obtained.  Slight hypoglycemia although patient is awake alert, and oriented.  D-dimer is positive.  She was sent over for CT to rule out PE.  CT scan is negative.  X-rays of the chest and left elbow are reassuring without obvious injury.  Patient has remained hemodynamically stable.  Heart rate improved to the 90s with fluids and medications.  She has not had any evidence of arrhythmia on the monitor.  We  will have her follow-up with cardiology as an outpatient.  (Labs, imaging, consults)  Labs: I Ordered, and personally interpreted labs.  The pertinent results include: CBC, BMP, troponin, D-dimer  Imaging Studies ordered: I ordered imaging studies including CT chest,X-ray elbow and chest I independently visualized and interpreted imaging. I agree with the radiologist interpretation  Additional history obtained from chart review.  External  records from outside source obtained and reviewed including previous evaluations  Cardiac Monitoring: The patient was maintained on a cardiac monitor.  I personally viewed and interpreted the cardiac monitored which showed an underlying rhythm of: Sinus tachycardia  Reevaluation: After the interventions noted above, I reevaluated the patient and found that they have :improved  Social Determinants of Health: Lives independently  Disposition: Discharge  Co morbidities that complicate the patient evaluation  Past Medical History:  Diagnosis Date   Anxiety    Chronic back pain    followed by pain clinic   Eczema    GERD (gastroesophageal reflux disease)    History of COVID-19 11/2020   per pt covid pneumonia, recovered at home, resolved   History of gestational diabetes    Hx of abnormal cervical Pap smear    Colpo;Last pap 2012, ASCUS w/ negative HPV   IBS (irritable bowel syndrome)    Migraines    takes ibprofen as needed per pt   Pre-diabetes    followed by pcp  (12-23-2021 per pt checks blood sugar if has symptoms)   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   SUI (stress urinary incontinence, female)    Vaginal wall prolapse      Medicines Meds ordered this encounter  Medications   sodium chloride 0.9 % bolus 1,000 mL   LORazepam (ATIVAN) injection 1 mg   iohexol (OMNIPAQUE) 350 MG/ML injection 75 mL    I have reviewed the patients home medicines and have made adjustments as needed  Problem List / ED Course: Problem List  Items Addressed This Visit   None Visit Diagnoses     Syncope, unspecified syncope type    -  Primary   Relevant Orders   Ambulatory referral to Cardiology                   Final Clinical Impression(s) / ED Diagnoses Final diagnoses:  Syncope, unspecified syncope type    Rx / DC Orders ED Discharge Orders          Ordered    Ambulatory referral to Cardiology       Comments: If you have not heard from the Cardiology office within the next 72 hours please call [phone].   05/11/22 0981              Wilkie Aye, Mayer Masker, MD 05/11/22 0451

## 2022-05-11 NOTE — ED Notes (Signed)
Lying BP: 111/73 Lying HR: 113 Sitting BP: 118/82 Sitting HR: 115 Standing BP: 105/80  Standing HR:132

## 2022-05-11 NOTE — ED Notes (Signed)
Pt verbalizes understanding of discharge instructions. Opportunity for questioning and answers were provided. Pt discharged from ED to home with family.    

## 2022-05-11 NOTE — Discharge Instructions (Signed)
You are seen today after an episode of passing out.  Your work-up today is reassuring.  Follow-up with cardiology.  Of note, your blood sugar was slightly low.  Make sure that you are eating small frequent meals.

## 2022-05-11 NOTE — ED Triage Notes (Signed)
States she "passed out" approx 2100 C/o dizziness Left arm and back pain from fall Hx of panic attacks

## 2022-05-11 NOTE — ED Notes (Signed)
Gave pt peanut butter crackers and sprite

## 2022-05-13 DIAGNOSIS — R7303 Prediabetes: Secondary | ICD-10-CM | POA: Diagnosis not present

## 2022-05-13 DIAGNOSIS — J302 Other seasonal allergic rhinitis: Secondary | ICD-10-CM | POA: Diagnosis not present

## 2022-05-13 DIAGNOSIS — Z6832 Body mass index (BMI) 32.0-32.9, adult: Secondary | ICD-10-CM | POA: Diagnosis not present

## 2022-05-13 DIAGNOSIS — F411 Generalized anxiety disorder: Secondary | ICD-10-CM | POA: Diagnosis not present

## 2022-05-26 ENCOUNTER — Encounter: Payer: Self-pay | Admitting: *Deleted

## 2022-05-26 DIAGNOSIS — M79661 Pain in right lower leg: Secondary | ICD-10-CM | POA: Diagnosis not present

## 2022-05-26 DIAGNOSIS — M25552 Pain in left hip: Secondary | ICD-10-CM | POA: Diagnosis not present

## 2022-05-26 DIAGNOSIS — J3089 Other allergic rhinitis: Secondary | ICD-10-CM | POA: Diagnosis not present

## 2022-05-26 DIAGNOSIS — G894 Chronic pain syndrome: Secondary | ICD-10-CM | POA: Diagnosis not present

## 2022-05-26 DIAGNOSIS — M79662 Pain in left lower leg: Secondary | ICD-10-CM | POA: Diagnosis not present

## 2022-05-26 DIAGNOSIS — J3081 Allergic rhinitis due to animal (cat) (dog) hair and dander: Secondary | ICD-10-CM | POA: Diagnosis not present

## 2022-05-26 DIAGNOSIS — M25551 Pain in right hip: Secondary | ICD-10-CM | POA: Diagnosis not present

## 2022-05-26 DIAGNOSIS — J301 Allergic rhinitis due to pollen: Secondary | ICD-10-CM | POA: Diagnosis not present

## 2022-05-27 ENCOUNTER — Encounter: Payer: Self-pay | Admitting: Obstetrics and Gynecology

## 2022-06-06 DIAGNOSIS — Z419 Encounter for procedure for purposes other than remedying health state, unspecified: Secondary | ICD-10-CM | POA: Diagnosis not present

## 2022-06-16 DIAGNOSIS — K289 Gastrojejunal ulcer, unspecified as acute or chronic, without hemorrhage or perforation: Secondary | ICD-10-CM | POA: Diagnosis not present

## 2022-06-16 DIAGNOSIS — K219 Gastro-esophageal reflux disease without esophagitis: Secondary | ICD-10-CM | POA: Diagnosis not present

## 2022-06-16 DIAGNOSIS — Z6831 Body mass index (BMI) 31.0-31.9, adult: Secondary | ICD-10-CM | POA: Diagnosis not present

## 2022-06-16 DIAGNOSIS — R634 Abnormal weight loss: Secondary | ICD-10-CM | POA: Diagnosis not present

## 2022-06-16 DIAGNOSIS — E669 Obesity, unspecified: Secondary | ICD-10-CM | POA: Diagnosis not present

## 2022-06-16 DIAGNOSIS — Z9884 Bariatric surgery status: Secondary | ICD-10-CM | POA: Diagnosis not present

## 2022-06-18 ENCOUNTER — Other Ambulatory Visit: Payer: Self-pay | Admitting: Obstetrics and Gynecology

## 2022-06-18 DIAGNOSIS — B379 Candidiasis, unspecified: Secondary | ICD-10-CM

## 2022-06-23 DIAGNOSIS — M25551 Pain in right hip: Secondary | ICD-10-CM | POA: Diagnosis not present

## 2022-06-23 DIAGNOSIS — M79661 Pain in right lower leg: Secondary | ICD-10-CM | POA: Diagnosis not present

## 2022-06-23 DIAGNOSIS — M79662 Pain in left lower leg: Secondary | ICD-10-CM | POA: Diagnosis not present

## 2022-06-23 DIAGNOSIS — M545 Low back pain, unspecified: Secondary | ICD-10-CM | POA: Diagnosis not present

## 2022-06-23 DIAGNOSIS — J3089 Other allergic rhinitis: Secondary | ICD-10-CM | POA: Diagnosis not present

## 2022-06-23 DIAGNOSIS — G894 Chronic pain syndrome: Secondary | ICD-10-CM | POA: Diagnosis not present

## 2022-06-23 DIAGNOSIS — J301 Allergic rhinitis due to pollen: Secondary | ICD-10-CM | POA: Diagnosis not present

## 2022-06-23 DIAGNOSIS — J3081 Allergic rhinitis due to animal (cat) (dog) hair and dander: Secondary | ICD-10-CM | POA: Diagnosis not present

## 2022-06-23 DIAGNOSIS — M25552 Pain in left hip: Secondary | ICD-10-CM | POA: Diagnosis not present

## 2022-07-03 ENCOUNTER — Ambulatory Visit (INDEPENDENT_AMBULATORY_CARE_PROVIDER_SITE_OTHER): Payer: Medicaid Other | Admitting: Obstetrics and Gynecology

## 2022-07-03 ENCOUNTER — Encounter: Payer: Self-pay | Admitting: Obstetrics and Gynecology

## 2022-07-03 ENCOUNTER — Other Ambulatory Visit (HOSPITAL_COMMUNITY)
Admission: RE | Admit: 2022-07-03 | Discharge: 2022-07-03 | Disposition: A | Discharge status: Home / Self Care | Payer: Medicaid Other | Attending: Obstetrics and Gynecology | Admitting: Obstetrics and Gynecology

## 2022-07-03 VITALS — BP 122/88 | HR 101

## 2022-07-03 DIAGNOSIS — N390 Urinary tract infection, site not specified: Secondary | ICD-10-CM

## 2022-07-03 DIAGNOSIS — B9620 Unspecified Escherichia coli [E. coli] as the cause of diseases classified elsewhere: Secondary | ICD-10-CM | POA: Insufficient documentation

## 2022-07-03 DIAGNOSIS — R82998 Other abnormal findings in urine: Secondary | ICD-10-CM | POA: Diagnosis not present

## 2022-07-03 DIAGNOSIS — R8290 Unspecified abnormal findings in urine: Secondary | ICD-10-CM | POA: Diagnosis not present

## 2022-07-03 DIAGNOSIS — R350 Frequency of micturition: Secondary | ICD-10-CM

## 2022-07-03 LAB — POCT URINALYSIS DIPSTICK
Bilirubin, UA: NEGATIVE
Blood, UA: NEGATIVE
Glucose, UA: NEGATIVE
Ketones, UA: NEGATIVE
Nitrite, UA: POSITIVE
Protein, UA: NEGATIVE
Spec Grav, UA: 1.02 (ref 1.010–1.025)
Urobilinogen, UA: 0.2 E.U./dL
pH, UA: 6.5 (ref 5.0–8.0)

## 2022-07-03 MED ORDER — FLUCONAZOLE 150 MG PO TABS: 150.0000 mg | ORAL_TABLET | Freq: Once | ORAL | 0 refills | Status: AC

## 2022-07-03 MED ORDER — ESTROGENS CONJUGATED 0.625 MG/GM VA CREA: TOPICAL_CREAM | VAGINAL | 11 refills | Status: AC

## 2022-07-03 MED ORDER — NITROFURANTOIN MONOHYD MACRO 100 MG PO CAPS: 100.0000 mg | ORAL_CAPSULE | Freq: Every day | ORAL | 5 refills | Status: DC

## 2022-07-03 MED ORDER — ESTRADIOL 0.1 MG/GM VA CREA: TOPICAL_CREAM | VAGINAL | 11 refills | Status: DC

## 2022-07-03 MED ORDER — SULFAMETHOXAZOLE-TRIMETHOPRIM 800-160 MG PO TABS: 1.0000 | ORAL_TABLET | Freq: Two times a day (BID) | ORAL | 0 refills | Status: AC

## 2022-07-03 NOTE — Patient Instructions (Signed)
Take bactrim twice a week for 2 weeks (14 days) Then start macrobid once daily for 6 months Use estrace cream 0.5g twice a week Continue methenamine twice a day  Start D-mannose supplement daily  Continue cranberry supplement

## 2022-07-03 NOTE — Progress Notes (Signed)
[facility] Return Visit  SUBJECTIVE  History of Present Illness: Shelly Lane is a 46 y.o. female seen in follow-up for urinary tract infection.   Denies burning and urgency with urination. She continues to have the odor to her urine. She is taking methenamine and cranberry pills. She has also used an over the counter cleanse.   s/p Anterior and posterior repair, sacrospinous hysteropexy, cystoscopy on 12/25/21    Past Medical History: Patient  has a past medical history of Anxiety, Chronic back pain, Eczema, GERD (gastroesophageal reflux disease), History of COVID-19 (11/2020), History of gestational diabetes, abnormal cervical Pap smear, IBS (irritable bowel syndrome), Migraines, Pre-diabetes, S/P gastric sleeve procedure (07/14/2021), SUI (stress urinary incontinence, female), and Vaginal wall prolapse.   Past Surgical History: She  has a past surgical history that includes Wisdom tooth extraction; Laparoscopic Nissen fundoplication (04/16/2011); Cholecystectomy, laparoscopic (08/16/2007); Laparoscopic gastric sleeve resection (07/14/2021); Anterior and posterior repair with sacrospinous fixation (N/A, 12/25/2021); and Cystoscopy (N/A, 12/25/2021).   Medications: She has a current medication list which includes the following prescription(s): bupropion, clobetasol ointment, [START ON 07/06/2022] conjugated estrogens, cyclobenzaprine, dicyclomine, eluryng, escitalopram, fluconazole, fluconazole, fluticasone, gabapentin, loratadine, methenamine, nitrofurantoin (macrocrystal-monohydrate), oxycodone hcl, sulfamethoxazole-trimethoprim, zolpidem, acetaminophen, and ibuprofen.   Allergies: Patient is allergic to hydrocodone, hydroxyzine hcl, pineapple, reglan [metoclopramide], and subutex [buprenorphine].   Social History: Patient  reports that she has never smoked. She has never used smokeless tobacco. She reports that she does not drink alcohol and does not use drugs.       OBJECTIVE     Physical Exam: Vitals:   07/03/22 1415  BP: 122/88  Pulse: (!) 101   Gen: No apparent distress, A&O x 3.  Detailed Urogynecologic Evaluation:  deferred   POC urine: trace leukocytes, positive nitrites  ASSESSMENT AND PLAN    Ms. Easler is a 46 y.o. with:  1. Urinary frequency   2. Abnormal urine odor   3. Leukocytes in urine   4. Recurrent UTI    - Again discussed possibility of colonization with E. Coli, especially due to low/ negative leukocytes. She is very bothered by her symptom of urinary odor.  - Culture sent today. Will try with a longer course of antibiotics- bactrim DS BID x 14 days.  - Then will start daily prophylactic antibiotic with 100mg  macrobid daily x6 months - Diflucan ordered as she frequently gets yeast infection with antibiotic use.  - Continue with cranberry an methenamine.  - Also add D mannose OTC - Discussed possibility of declining estrogen, esp since she is on contraceptive ring. Will try vaginal estrogen 0.5g twice a week to prevent UTIs and help with vaginal pH.   Return 6 months or sooner if needed  , MD

## 2022-07-07 DIAGNOSIS — Z419 Encounter for procedure for purposes other than remedying health state, unspecified: Secondary | ICD-10-CM | POA: Diagnosis not present

## 2022-07-07 LAB — URINE CULTURE: Culture: >=100000 — AB

## 2022-07-13 DIAGNOSIS — K912 Postsurgical malabsorption, not elsewhere classified: Secondary | ICD-10-CM | POA: Diagnosis not present

## 2022-07-13 DIAGNOSIS — Z9884 Bariatric surgery status: Secondary | ICD-10-CM | POA: Diagnosis not present

## 2022-07-21 DIAGNOSIS — Z9884 Bariatric surgery status: Secondary | ICD-10-CM | POA: Diagnosis not present

## 2022-07-21 DIAGNOSIS — D563 Thalassemia minor: Secondary | ICD-10-CM | POA: Diagnosis not present

## 2022-07-21 DIAGNOSIS — G8929 Other chronic pain: Secondary | ICD-10-CM | POA: Diagnosis not present

## 2022-07-21 DIAGNOSIS — K219 Gastro-esophageal reflux disease without esophagitis: Secondary | ICD-10-CM | POA: Diagnosis not present

## 2022-07-21 DIAGNOSIS — K589 Irritable bowel syndrome without diarrhea: Secondary | ICD-10-CM | POA: Diagnosis not present

## 2022-07-21 DIAGNOSIS — Z9889 Other specified postprocedural states: Secondary | ICD-10-CM | POA: Diagnosis not present

## 2022-07-21 DIAGNOSIS — E559 Vitamin D deficiency, unspecified: Secondary | ICD-10-CM | POA: Diagnosis not present

## 2022-07-21 DIAGNOSIS — F32A Depression, unspecified: Secondary | ICD-10-CM | POA: Diagnosis not present

## 2022-07-21 DIAGNOSIS — M545 Low back pain, unspecified: Secondary | ICD-10-CM | POA: Diagnosis not present

## 2022-07-24 DIAGNOSIS — M79662 Pain in left lower leg: Secondary | ICD-10-CM | POA: Diagnosis not present

## 2022-07-24 DIAGNOSIS — J3081 Allergic rhinitis due to animal (cat) (dog) hair and dander: Secondary | ICD-10-CM | POA: Diagnosis not present

## 2022-07-24 DIAGNOSIS — M25551 Pain in right hip: Secondary | ICD-10-CM | POA: Diagnosis not present

## 2022-07-24 DIAGNOSIS — J3089 Other allergic rhinitis: Secondary | ICD-10-CM | POA: Diagnosis not present

## 2022-07-24 DIAGNOSIS — G894 Chronic pain syndrome: Secondary | ICD-10-CM | POA: Diagnosis not present

## 2022-07-24 DIAGNOSIS — M545 Low back pain, unspecified: Secondary | ICD-10-CM | POA: Diagnosis not present

## 2022-07-24 DIAGNOSIS — J301 Allergic rhinitis due to pollen: Secondary | ICD-10-CM | POA: Diagnosis not present

## 2022-07-24 DIAGNOSIS — M79661 Pain in right lower leg: Secondary | ICD-10-CM | POA: Diagnosis not present

## 2022-07-24 DIAGNOSIS — M25552 Pain in left hip: Secondary | ICD-10-CM | POA: Diagnosis not present

## 2022-08-07 DIAGNOSIS — Z419 Encounter for procedure for purposes other than remedying health state, unspecified: Secondary | ICD-10-CM | POA: Diagnosis not present

## 2022-08-13 DIAGNOSIS — Z9884 Bariatric surgery status: Secondary | ICD-10-CM | POA: Diagnosis not present

## 2022-08-13 DIAGNOSIS — L309 Dermatitis, unspecified: Secondary | ICD-10-CM | POA: Insufficient documentation

## 2022-08-13 DIAGNOSIS — M793 Panniculitis, unspecified: Secondary | ICD-10-CM | POA: Diagnosis not present

## 2022-08-25 DIAGNOSIS — K589 Irritable bowel syndrome without diarrhea: Secondary | ICD-10-CM | POA: Diagnosis not present

## 2022-08-25 DIAGNOSIS — K219 Gastro-esophageal reflux disease without esophagitis: Secondary | ICD-10-CM | POA: Diagnosis not present

## 2022-08-25 DIAGNOSIS — Z9889 Other specified postprocedural states: Secondary | ICD-10-CM | POA: Diagnosis not present

## 2022-08-25 DIAGNOSIS — Z9884 Bariatric surgery status: Secondary | ICD-10-CM | POA: Diagnosis not present

## 2022-08-27 DIAGNOSIS — J3081 Allergic rhinitis due to animal (cat) (dog) hair and dander: Secondary | ICD-10-CM | POA: Diagnosis not present

## 2022-08-27 DIAGNOSIS — G894 Chronic pain syndrome: Secondary | ICD-10-CM | POA: Diagnosis not present

## 2022-08-27 DIAGNOSIS — M79662 Pain in left lower leg: Secondary | ICD-10-CM | POA: Diagnosis not present

## 2022-08-27 DIAGNOSIS — M25551 Pain in right hip: Secondary | ICD-10-CM | POA: Diagnosis not present

## 2022-08-27 DIAGNOSIS — J301 Allergic rhinitis due to pollen: Secondary | ICD-10-CM | POA: Diagnosis not present

## 2022-08-27 DIAGNOSIS — M545 Low back pain, unspecified: Secondary | ICD-10-CM | POA: Diagnosis not present

## 2022-08-27 DIAGNOSIS — M25552 Pain in left hip: Secondary | ICD-10-CM | POA: Diagnosis not present

## 2022-08-27 DIAGNOSIS — M79661 Pain in right lower leg: Secondary | ICD-10-CM | POA: Diagnosis not present

## 2022-09-06 DIAGNOSIS — Z419 Encounter for procedure for purposes other than remedying health state, unspecified: Secondary | ICD-10-CM | POA: Diagnosis not present

## 2022-09-23 ENCOUNTER — Other Ambulatory Visit: Payer: Self-pay | Admitting: Obstetrics and Gynecology

## 2022-09-23 DIAGNOSIS — M792 Neuralgia and neuritis, unspecified: Secondary | ICD-10-CM

## 2022-09-24 DIAGNOSIS — M79662 Pain in left lower leg: Secondary | ICD-10-CM | POA: Diagnosis not present

## 2022-09-24 DIAGNOSIS — J3089 Other allergic rhinitis: Secondary | ICD-10-CM | POA: Diagnosis not present

## 2022-09-24 DIAGNOSIS — M79661 Pain in right lower leg: Secondary | ICD-10-CM | POA: Diagnosis not present

## 2022-09-24 DIAGNOSIS — J301 Allergic rhinitis due to pollen: Secondary | ICD-10-CM | POA: Diagnosis not present

## 2022-09-24 DIAGNOSIS — M545 Low back pain, unspecified: Secondary | ICD-10-CM | POA: Diagnosis not present

## 2022-09-24 DIAGNOSIS — J3081 Allergic rhinitis due to animal (cat) (dog) hair and dander: Secondary | ICD-10-CM | POA: Diagnosis not present

## 2022-09-24 DIAGNOSIS — G894 Chronic pain syndrome: Secondary | ICD-10-CM | POA: Diagnosis not present

## 2022-09-24 DIAGNOSIS — M25552 Pain in left hip: Secondary | ICD-10-CM | POA: Diagnosis not present

## 2022-09-24 DIAGNOSIS — M25551 Pain in right hip: Secondary | ICD-10-CM | POA: Diagnosis not present

## 2022-09-28 ENCOUNTER — Encounter: Payer: Self-pay | Admitting: *Deleted

## 2022-10-05 ENCOUNTER — Other Ambulatory Visit: Payer: Self-pay

## 2022-10-05 ENCOUNTER — Encounter (HOSPITAL_BASED_OUTPATIENT_CLINIC_OR_DEPARTMENT_OTHER): Payer: Self-pay | Admitting: Plastic Surgery

## 2022-10-07 DIAGNOSIS — Z419 Encounter for procedure for purposes other than remedying health state, unspecified: Secondary | ICD-10-CM | POA: Diagnosis not present

## 2022-10-07 MED ORDER — CHLORHEXIDINE GLUCONATE CLOTH 2 % EX PADS: 6.0000 | MEDICATED_PAD | Freq: Once | CUTANEOUS | Status: DC

## 2022-10-07 NOTE — Progress Notes (Signed)
       Patient Instructions  The night before surgery:  No food after midnight. ONLY clear liquids after midnight  The day of surgery (if you do NOT have diabetes):  Drink ONE (1) Pre-Surgery Clear Ensure as directed.   This drink was given to you during your hospital  pre-op appointment visit. The pre-op nurse will instruct you on the time to drink the  Pre-Surgery Ensure depending on your surgery time. Finish the drink at the designated time by the pre-op nurse.  Nothing else to drink after completing the  Pre-Surgery Clear Ensure.  The day of surgery (if you have diabetes): Drink ONE (1) Gatorade 2 (G2) as directed. This drink was given to you during your hospital  pre-op appointment visit.  The pre-op nurse will instruct you on the time to drink the   Gatorade 2 (G2) depending on your surgery time. Color of the Gatorade may vary. Red is not allowed. Nothing else to drink after completing the  Gatorade 2 (G2).         If you have questions, please contact your surgeon's office. Surgical soap given to patient with instructions and patient verbalized understanding.  

## 2022-10-07 NOTE — H&P (Signed)
Subjective:     Patient ID: Shelly Lane is a 46 y.o. female.   HPI   Returns for follow up discussion prior to planned panniculectomy. Highest weight 288 lb.  Underwent sleeve gastric bypass 8.2022. Lowest weight current. Per chart review weight down 11 lb over last 2 months, stable over last month. Goal per patient 10 lb more. Reports over 6 month history itching, excoriations and skin lesions beneath panniculus. This has not improved with over 3 month trial hygiene measures and powder.   On chronic oxycodone for back pain, per patient related to 11 pregnancies. Managed by pain provider Cyndi Lennert MD.   Lives with spouse and 61 of her 11 kids that span from age 30 to adult.    Review of Systems  Skin: Positive for rash.    Remainder 12 point review negative    Objective:   Physical Exam Cardiovascular:     Rate and Rhythm: Normal rate and regular rhythm.     Heart sounds: Normal heart sounds.  Pulmonary:     Effort: Pulmonary effort is normal.     Breath sounds: Normal breath sounds.  Skin:    Comments: Fitzpatrick 5  Neurological:     Mental Status: She is oriented to person, place, and time.   Abd: no hernias, panniculus present that extends below symphysis pubis, multiple skin tags beneath panniculus Chest with soft tissue rolls      Assessment:     Panniculitis History gastric sleeve    Plan:     Reviewed panniculectomy vs abdominoplasty. Reviewed panniculectomy is not a cosmetic procedure and does not involve muscle imbrication. Reviewed changes with aging, wt gain/loss, will not have as taught skin given significant loss elasticity. Plan overnight stay. Reviewed drains, post op limitations. Reviewed scar maturation over months. Reviewed expected area scars area soft tissue resection and expected elevation mons. Counseled will not change contour mons, will not affect back or thighs.    This surgery will also not affect upper chest rolls. Recommend she be  stable at goal weight prior to surgery. Reviewed risks hyper or hypopigmentation scars.    Additional risks including but not limited to bleeding, hematoma, seroma, damage to adjacent structures, infection, wound healing problems, need for additional procedures, blood clots in legs or lungs, unacceptable cosmetic result reviewed.   Drain teaching completed. On pain contract-no Rx provided and pain provider will provide all medications per patient.

## 2022-10-08 DIAGNOSIS — M545 Low back pain, unspecified: Secondary | ICD-10-CM | POA: Diagnosis not present

## 2022-10-08 DIAGNOSIS — J3081 Allergic rhinitis due to animal (cat) (dog) hair and dander: Secondary | ICD-10-CM | POA: Diagnosis not present

## 2022-10-08 DIAGNOSIS — M25552 Pain in left hip: Secondary | ICD-10-CM | POA: Diagnosis not present

## 2022-10-08 DIAGNOSIS — M79661 Pain in right lower leg: Secondary | ICD-10-CM | POA: Diagnosis not present

## 2022-10-08 DIAGNOSIS — M25551 Pain in right hip: Secondary | ICD-10-CM | POA: Diagnosis not present

## 2022-10-08 DIAGNOSIS — M79662 Pain in left lower leg: Secondary | ICD-10-CM | POA: Diagnosis not present

## 2022-10-08 DIAGNOSIS — J3089 Other allergic rhinitis: Secondary | ICD-10-CM | POA: Diagnosis not present

## 2022-10-08 DIAGNOSIS — J301 Allergic rhinitis due to pollen: Secondary | ICD-10-CM | POA: Diagnosis not present

## 2022-10-08 DIAGNOSIS — G894 Chronic pain syndrome: Secondary | ICD-10-CM | POA: Diagnosis not present

## 2022-10-13 ENCOUNTER — Ambulatory Visit (HOSPITAL_BASED_OUTPATIENT_CLINIC_OR_DEPARTMENT_OTHER): Payer: Medicaid Other | Admitting: Anesthesiology

## 2022-10-13 ENCOUNTER — Encounter (HOSPITAL_BASED_OUTPATIENT_CLINIC_OR_DEPARTMENT_OTHER): Payer: Self-pay | Admitting: Plastic Surgery

## 2022-10-13 ENCOUNTER — Other Ambulatory Visit: Payer: Self-pay

## 2022-10-13 ENCOUNTER — Encounter (HOSPITAL_BASED_OUTPATIENT_CLINIC_OR_DEPARTMENT_OTHER)
Admission: RE | Disposition: A | Discharge status: Home / Self Care | Payer: Self-pay | Source: Ambulatory Visit | Attending: Plastic Surgery

## 2022-10-13 ENCOUNTER — Ambulatory Visit (HOSPITAL_BASED_OUTPATIENT_CLINIC_OR_DEPARTMENT_OTHER)
Admission: RE | Admit: 2022-10-13 | Discharge: 2022-10-14 | Disposition: A | Discharge status: Home / Self Care | Payer: Medicaid Other | Source: Ambulatory Visit | Attending: Plastic Surgery | Admitting: Plastic Surgery

## 2022-10-13 DIAGNOSIS — M793 Panniculitis, unspecified: Secondary | ICD-10-CM

## 2022-10-13 DIAGNOSIS — Z9884 Bariatric surgery status: Secondary | ICD-10-CM | POA: Diagnosis not present

## 2022-10-13 DIAGNOSIS — F419 Anxiety disorder, unspecified: Secondary | ICD-10-CM | POA: Insufficient documentation

## 2022-10-13 DIAGNOSIS — K219 Gastro-esophageal reflux disease without esophagitis: Secondary | ICD-10-CM | POA: Diagnosis not present

## 2022-10-13 DIAGNOSIS — F32A Depression, unspecified: Secondary | ICD-10-CM | POA: Insufficient documentation

## 2022-10-13 DIAGNOSIS — Z01818 Encounter for other preprocedural examination: Secondary | ICD-10-CM

## 2022-10-13 HISTORY — PX: PANNICULECTOMY: SHX5360

## 2022-10-13 LAB — POCT PREGNANCY, URINE: Preg Test, Ur: NEGATIVE

## 2022-10-13 SURGERY — PANNICULECTOMY
Anesthesia: General | Site: Abdomen

## 2022-10-13 MED ORDER — BUPIVACAINE HCL (PF) 0.5 % IJ SOLN
INTRAMUSCULAR | Status: DC | PRN
  Administered 2022-10-13: 30 mL

## 2022-10-13 MED ORDER — KCL IN DEXTROSE-NACL 20-5-0.45 MEQ/L-%-% IV SOLN
INTRAVENOUS | Status: DC
  Filled 2022-10-13: qty 1000

## 2022-10-13 MED ORDER — DEXMEDETOMIDINE HCL IN NACL 80 MCG/20ML IV SOLN
INTRAVENOUS | Status: AC
  Filled 2022-10-13: qty 20

## 2022-10-13 MED ORDER — OXYCODONE HCL 5 MG PO TABS
15.0000 mg | ORAL_TABLET | Freq: Four times a day (QID) | ORAL | Status: DC | PRN
  Administered 2022-10-14: 15 mg via ORAL
  Filled 2022-10-13 (×2): qty 3

## 2022-10-13 MED ORDER — ONDANSETRON HCL 4 MG/2ML IJ SOLN: 4.0000 mg | Freq: Four times a day (QID) | INTRAMUSCULAR | Status: DC | PRN

## 2022-10-13 MED ORDER — MIDAZOLAM HCL 5 MG/5ML IJ SOLN
INTRAMUSCULAR | Status: DC | PRN
  Administered 2022-10-13: 2 mg via INTRAVENOUS

## 2022-10-13 MED ORDER — ESCITALOPRAM OXALATE 10 MG PO TABS
10.0000 mg | ORAL_TABLET | Freq: Every day | ORAL | Status: DC
  Administered 2022-10-13: 10 mg via ORAL
  Filled 2022-10-13: qty 1

## 2022-10-13 MED ORDER — CYCLOBENZAPRINE HCL 10 MG PO TABS
10.0000 mg | ORAL_TABLET | Freq: Three times a day (TID) | ORAL | Status: DC | PRN
  Administered 2022-10-13: 10 mg via ORAL
  Filled 2022-10-13: qty 1

## 2022-10-13 MED ORDER — ROCURONIUM BROMIDE 10 MG/ML (PF) SYRINGE
PREFILLED_SYRINGE | INTRAVENOUS | Status: AC
  Filled 2022-10-13: qty 10

## 2022-10-13 MED ORDER — PHENYLEPHRINE 80 MCG/ML (10ML) SYRINGE FOR IV PUSH (FOR BLOOD PRESSURE SUPPORT)
PREFILLED_SYRINGE | INTRAVENOUS | Status: AC
  Filled 2022-10-13: qty 10

## 2022-10-13 MED ORDER — HYDROMORPHONE HCL 1 MG/ML IJ SOLN
INTRAMUSCULAR | Status: DC | PRN
  Administered 2022-10-13 (×2): .5 mg via INTRAVENOUS

## 2022-10-13 MED ORDER — HYDROMORPHONE HCL 1 MG/ML IJ SOLN
INTRAMUSCULAR | Status: AC
  Filled 2022-10-13: qty 1

## 2022-10-13 MED ORDER — ONDANSETRON HCL 4 MG/2ML IJ SOLN
INTRAMUSCULAR | Status: AC
  Filled 2022-10-13: qty 2

## 2022-10-13 MED ORDER — DEXAMETHASONE SODIUM PHOSPHATE 10 MG/ML IJ SOLN
INTRAMUSCULAR | Status: AC
  Filled 2022-10-13: qty 1

## 2022-10-13 MED ORDER — HEPARIN SODIUM (PORCINE) 5000 UNIT/ML IJ SOLN
INTRAMUSCULAR | Status: AC
  Filled 2022-10-13: qty 1

## 2022-10-13 MED ORDER — FENTANYL CITRATE (PF) 100 MCG/2ML IJ SOLN
INTRAMUSCULAR | Status: DC | PRN
  Administered 2022-10-13: 100 ug via INTRAVENOUS

## 2022-10-13 MED ORDER — ATROPINE SULFATE 0.4 MG/ML IV SOLN
INTRAVENOUS | Status: AC
  Filled 2022-10-13: qty 1

## 2022-10-13 MED ORDER — PROPOFOL 500 MG/50ML IV EMUL
INTRAVENOUS | Status: DC | PRN
  Administered 2022-10-13: 25 ug/kg/min via INTRAVENOUS

## 2022-10-13 MED ORDER — DICYCLOMINE HCL 10 MG PO CAPS
10.0000 mg | ORAL_CAPSULE | Freq: Three times a day (TID) | ORAL | Status: DC | PRN
  Administered 2022-10-13: 10 mg via ORAL
  Filled 2022-10-13 (×2): qty 1

## 2022-10-13 MED ORDER — CELECOXIB 200 MG PO CAPS
200.0000 mg | ORAL_CAPSULE | ORAL | Status: AC
  Administered 2022-10-13: 200 mg via ORAL

## 2022-10-13 MED ORDER — LIDOCAINE 2% (20 MG/ML) 5 ML SYRINGE
INTRAMUSCULAR | Status: AC
  Filled 2022-10-13: qty 5

## 2022-10-13 MED ORDER — KETOROLAC TROMETHAMINE 30 MG/ML IJ SOLN
30.0000 mg | Freq: Three times a day (TID) | INTRAMUSCULAR | Status: AC
  Administered 2022-10-13 – 2022-10-14 (×3): 30 mg via INTRAVENOUS
  Filled 2022-10-13 (×3): qty 1

## 2022-10-13 MED ORDER — FENTANYL CITRATE (PF) 100 MCG/2ML IJ SOLN
INTRAMUSCULAR | Status: AC
  Filled 2022-10-13: qty 2

## 2022-10-13 MED ORDER — CEFAZOLIN SODIUM-DEXTROSE 2-4 GM/100ML-% IV SOLN
INTRAVENOUS | Status: AC
  Filled 2022-10-13: qty 100

## 2022-10-13 MED ORDER — CELECOXIB 200 MG PO CAPS
ORAL_CAPSULE | ORAL | Status: AC
  Filled 2022-10-13: qty 1

## 2022-10-13 MED ORDER — GABAPENTIN 300 MG PO CAPS
300.0000 mg | ORAL_CAPSULE | ORAL | Status: AC
  Administered 2022-10-13: 300 mg via ORAL

## 2022-10-13 MED ORDER — DEXAMETHASONE SODIUM PHOSPHATE 4 MG/ML IJ SOLN
INTRAMUSCULAR | Status: DC | PRN
  Administered 2022-10-13: 5 mg via INTRAVENOUS

## 2022-10-13 MED ORDER — OXYCODONE HCL 5 MG PO TABS: 5.0000 mg | ORAL_TABLET | Freq: Once | ORAL | Status: DC | PRN

## 2022-10-13 MED ORDER — GABAPENTIN 300 MG PO CAPS
ORAL_CAPSULE | ORAL | Status: AC
  Filled 2022-10-13: qty 1

## 2022-10-13 MED ORDER — ONDANSETRON 4 MG PO TBDP: 4.0000 mg | ORAL_TABLET | Freq: Four times a day (QID) | ORAL | Status: DC | PRN

## 2022-10-13 MED ORDER — ROCURONIUM BROMIDE 100 MG/10ML IV SOLN
INTRAVENOUS | Status: DC | PRN
  Administered 2022-10-13: 70 mg via INTRAVENOUS

## 2022-10-13 MED ORDER — 0.9 % SODIUM CHLORIDE (POUR BTL) OPTIME
TOPICAL | Status: DC | PRN
  Administered 2022-10-13: 1000 mL

## 2022-10-13 MED ORDER — SCOPOLAMINE 1 MG/3DAYS TD PT72
MEDICATED_PATCH | TRANSDERMAL | Status: AC
  Filled 2022-10-13: qty 1

## 2022-10-13 MED ORDER — SUCCINYLCHOLINE CHLORIDE 200 MG/10ML IV SOSY
PREFILLED_SYRINGE | INTRAVENOUS | Status: AC
  Filled 2022-10-13: qty 10

## 2022-10-13 MED ORDER — CEFAZOLIN SODIUM-DEXTROSE 2-4 GM/100ML-% IV SOLN
2.0000 g | INTRAVENOUS | Status: AC
  Administered 2022-10-13: 2 g via INTRAVENOUS

## 2022-10-13 MED ORDER — HEPARIN SODIUM (PORCINE) 5000 UNIT/ML IJ SOLN
5000.0000 [IU] | Freq: Once | INTRAMUSCULAR | Status: AC
  Administered 2022-10-13: 5000 [IU] via SUBCUTANEOUS

## 2022-10-13 MED ORDER — HYDROMORPHONE HCL 1 MG/ML IJ SOLN: 0.5000 mg | INTRAMUSCULAR | Status: DC | PRN

## 2022-10-13 MED ORDER — OXYCODONE HCL 5 MG/5ML PO SOLN: 5.0000 mg | Freq: Once | ORAL | Status: DC | PRN

## 2022-10-13 MED ORDER — MIDAZOLAM HCL 2 MG/2ML IJ SOLN
INTRAMUSCULAR | Status: AC
  Filled 2022-10-13: qty 2

## 2022-10-13 MED ORDER — DEXMEDETOMIDINE HCL IN NACL 80 MCG/20ML IV SOLN
INTRAVENOUS | Status: DC | PRN
  Administered 2022-10-13: 12 ug via BUCCAL

## 2022-10-13 MED ORDER — ACETAMINOPHEN 500 MG PO TABS
ORAL_TABLET | ORAL | Status: AC
  Filled 2022-10-13: qty 2

## 2022-10-13 MED ORDER — ONDANSETRON HCL 4 MG/2ML IJ SOLN: 4.0000 mg | Freq: Once | INTRAMUSCULAR | Status: DC | PRN

## 2022-10-13 MED ORDER — ENOXAPARIN SODIUM 40 MG/0.4ML IJ SOSY
40.0000 mg | PREFILLED_SYRINGE | INTRAMUSCULAR | Status: DC
  Administered 2022-10-14: 40 mg via SUBCUTANEOUS
  Filled 2022-10-13: qty 0.4

## 2022-10-13 MED ORDER — AMISULPRIDE (ANTIEMETIC) 5 MG/2ML IV SOLN: 10.0000 mg | Freq: Once | INTRAVENOUS | Status: DC | PRN

## 2022-10-13 MED ORDER — SCOPOLAMINE 1 MG/3DAYS TD PT72
1.0000 | MEDICATED_PATCH | Freq: Once | TRANSDERMAL | Status: DC
  Administered 2022-10-13: 1.5 mg via TRANSDERMAL

## 2022-10-13 MED ORDER — EPHEDRINE 5 MG/ML INJ
INTRAVENOUS | Status: AC
  Filled 2022-10-13: qty 5

## 2022-10-13 MED ORDER — ACETAMINOPHEN 500 MG PO TABS
1000.0000 mg | ORAL_TABLET | Freq: Once | ORAL | Status: AC
  Administered 2022-10-13: 1000 mg via ORAL

## 2022-10-13 MED ORDER — KETAMINE HCL 10 MG/ML IJ SOLN
INTRAMUSCULAR | Status: DC | PRN
  Administered 2022-10-13 (×2): 10 mg via INTRAVENOUS
  Administered 2022-10-13: 20 mg via INTRAVENOUS

## 2022-10-13 MED ORDER — KETAMINE HCL 50 MG/5ML IJ SOSY
PREFILLED_SYRINGE | INTRAMUSCULAR | Status: AC
  Filled 2022-10-13: qty 5

## 2022-10-13 MED ORDER — LIDOCAINE HCL (CARDIAC) PF 100 MG/5ML IV SOSY
PREFILLED_SYRINGE | INTRAVENOUS | Status: DC | PRN
  Administered 2022-10-13: 100 mg via INTRAVENOUS

## 2022-10-13 MED ORDER — LACTATED RINGERS IV SOLN: INTRAVENOUS | Status: DC

## 2022-10-13 MED ORDER — FENTANYL CITRATE (PF) 100 MCG/2ML IJ SOLN
25.0000 ug | INTRAMUSCULAR | Status: DC | PRN
  Administered 2022-10-13 (×3): 50 ug via INTRAVENOUS

## 2022-10-13 MED ORDER — SUGAMMADEX SODIUM 200 MG/2ML IV SOLN
INTRAVENOUS | Status: DC | PRN
  Administered 2022-10-13: 200 mg via INTRAVENOUS

## 2022-10-13 MED ORDER — PROPOFOL 10 MG/ML IV BOLUS
INTRAVENOUS | Status: DC | PRN
  Administered 2022-10-13: 200 mg via INTRAVENOUS

## 2022-10-13 MED ORDER — ACETAMINOPHEN 500 MG PO TABS: 1000.0000 mg | ORAL_TABLET | ORAL | Status: DC

## 2022-10-13 SURGICAL SUPPLY — 56 items
ADH SKN CLS APL DERMABOND .7 (GAUZE/BANDAGES/DRESSINGS) ×2
APL PRP STRL LF DISP 70% ISPRP (MISCELLANEOUS) ×1
APPLIER CLIP 9.375 MED OPEN (MISCELLANEOUS) ×2
APR CLP MED 9.3 20 MLT OPN (MISCELLANEOUS) ×2
BINDER ABDOMINAL 10 UNV 27-48 (MISCELLANEOUS) IMPLANT
BINDER ABDOMINAL 12 SM 30-45 (SOFTGOODS) IMPLANT
BLADE CLIPPER SURG (BLADE) IMPLANT
BLADE SURG 10 STRL SS (BLADE) ×2 IMPLANT
BLADE SURG 11 STRL SS (BLADE) ×1 IMPLANT
BLADE SURG 15 STRL LF DISP TIS (BLADE) IMPLANT
BLADE SURG 15 STRL SS (BLADE) ×1
CANISTER SUCT 1200ML W/VALVE (MISCELLANEOUS) ×1 IMPLANT
CHLORAPREP W/TINT 26 (MISCELLANEOUS) ×1 IMPLANT
CLIP APPLIE 9.375 MED OPEN (MISCELLANEOUS) ×1 IMPLANT
COVER BACK TABLE 60X90IN (DRAPES) ×1 IMPLANT
COVER MAYO STAND STRL (DRAPES) ×1 IMPLANT
DERMABOND ADVANCED .7 DNX12 (GAUZE/BANDAGES/DRESSINGS) ×2 IMPLANT
DRAIN CHANNEL 15F RND FF W/TCR (WOUND CARE) ×2 IMPLANT
DRAIN CHANNEL 19F RND (DRAIN) IMPLANT
DRAPE TOP ARMCOVERS (MISCELLANEOUS) ×1 IMPLANT
DRAPE U-SHAPE 76X120 STRL (DRAPES) ×1 IMPLANT
DRAPE UTILITY XL STRL (DRAPES) ×1 IMPLANT
ELECT BLADE 4.0 EZ CLEAN MEGAD (MISCELLANEOUS)
ELECT COATED BLADE 2.86 ST (ELECTRODE) IMPLANT
ELECT REM PT RETURN 9FT ADLT (ELECTROSURGICAL) ×1
ELECTRODE BLDE 4.0 EZ CLN MEGD (MISCELLANEOUS) IMPLANT
ELECTRODE REM PT RTRN 9FT ADLT (ELECTROSURGICAL) ×1 IMPLANT
EVACUATOR SILICONE 100CC (DRAIN) ×2 IMPLANT
GAUZE PAD ABD 8X10 STRL (GAUZE/BANDAGES/DRESSINGS) ×2 IMPLANT
GAUZE XEROFORM 1X8 LF (GAUZE/BANDAGES/DRESSINGS) IMPLANT
GLOVE BIO SURGEON STRL SZ 6 (GLOVE) ×3 IMPLANT
GOWN STRL REUS W/ TWL LRG LVL3 (GOWN DISPOSABLE) ×2 IMPLANT
GOWN STRL REUS W/TWL LRG LVL3 (GOWN DISPOSABLE) ×2
NDL HYPO 25X1 1.5 SAFETY (NEEDLE) IMPLANT
NEEDLE HYPO 25X1 1.5 SAFETY (NEEDLE) ×1 IMPLANT
NS IRRIG 1000ML POUR BTL (IV SOLUTION) ×1 IMPLANT
PACK BASIN DAY SURGERY FS (CUSTOM PROCEDURE TRAY) ×1 IMPLANT
PENCIL SMOKE EVACUATOR (MISCELLANEOUS) ×1 IMPLANT
PIN SAFETY STERILE (MISCELLANEOUS) ×1 IMPLANT
SHEET MEDIUM DRAPE 40X70 STRL (DRAPES) ×2 IMPLANT
SLEEVE SCD COMPRESS KNEE MED (STOCKING) ×1 IMPLANT
SPONGE T-LAP 18X18 ~~LOC~~+RFID (SPONGE) ×2 IMPLANT
STAPLER VISISTAT 35W (STAPLE) ×1 IMPLANT
SUT ETHILON 2 0 FS 18 (SUTURE) ×2 IMPLANT
SUT MNCRL AB 4-0 PS2 18 (SUTURE) ×2 IMPLANT
SUT PDS AB 0 CT 36 (SUTURE) ×1 IMPLANT
SUT PDS AB 2-0 CT2 27 (SUTURE) IMPLANT
SUT PLAIN 5 0 P 3 18 (SUTURE) IMPLANT
SUT VLOC 180 0 24IN GS25 (SUTURE) ×1 IMPLANT
SYR BULB IRRIG 60ML STRL (SYRINGE) ×1 IMPLANT
SYR CONTROL 10ML LL (SYRINGE) IMPLANT
TOWEL GREEN STERILE FF (TOWEL DISPOSABLE) ×1 IMPLANT
TRAY FOLEY W/BAG SLVR 14FR LF (SET/KITS/TRAYS/PACK) IMPLANT
TUBE CONNECTING 20X1/4 (TUBING) ×1 IMPLANT
UNDERPAD 30X36 HEAVY ABSORB (UNDERPADS AND DIAPERS) ×2 IMPLANT
YANKAUER SUCT BULB TIP NO VENT (SUCTIONS) ×1 IMPLANT

## 2022-10-13 NOTE — Interval H&P Note (Signed)
History and Physical Interval Note:  10/13/2022 10:50 AM  Shelly Lane  has presented today for surgery, with the diagnosis of panniculitis, hx bariatric surgery.  The various methods of treatment have been discussed with the patient and family. After consideration of risks, benefits and other options for treatment, the patient has consented to  Procedure(s): PANNICULECTOMY (N/A) as a surgical intervention.  The patient's history has been reviewed, patient examined, no change in status, stable for surgery.  I have reviewed the patient's chart and labs.  Questions were answered to the patient's satisfaction.     Arnoldo Hooker Shelly Lane

## 2022-10-13 NOTE — Transfer of Care (Signed)
Immediate Anesthesia Transfer of Care Note  Patient: Shelly Lane  Procedure(s) Performed: PANNICULECTOMY (Abdomen)  Patient Location: PACU  Anesthesia Type:General  Level of Consciousness: sedated  Airway & Oxygen Therapy: Patient Spontanous Breathing and Patient connected to face mask oxygen  Post-op Assessment: Report given to RN and Post -op Vital signs reviewed and stable  Post vital signs: Reviewed and stable  Last Vitals:  Vitals Value Taken Time  BP 102/86 10/13/22 1433  Temp    Pulse 98 10/13/22 1435  Resp 14 10/13/22 1435  SpO2 100 % 10/13/22 1435  Vitals shown include unvalidated device data.  Last Pain:  Vitals:   10/13/22 1022  TempSrc: Oral  PainSc: 0-No pain      Patients Stated Pain Goal: 4 (88/28/00 3491)  Complications: No notable events documented.

## 2022-10-13 NOTE — Anesthesia Postprocedure Evaluation (Signed)
Anesthesia Post Note  Patient: Shelly Lane  Procedure(s) Performed: PANNICULECTOMY (Abdomen)     Patient location during evaluation: PACU Anesthesia Type: General Level of consciousness: awake and alert Pain management: pain level controlled Vital Signs Assessment: post-procedure vital signs reviewed and stable Respiratory status: spontaneous breathing, nonlabored ventilation and respiratory function stable Cardiovascular status: blood pressure returned to baseline and stable Postop Assessment: no apparent nausea or vomiting Anesthetic complications: no   No notable events documented.  Last Vitals:  Vitals:   10/13/22 1430 10/13/22 1445  BP: 102/86 91/81  Pulse: 100 (!) 103  Resp: 19 (!) 21  Temp: 36.8 C   SpO2: 100% 100%    Last Pain:  Vitals:   10/13/22 1430  TempSrc:   PainSc: Asleep                 Lidia Collum

## 2022-10-13 NOTE — Discharge Instructions (Addendum)

## 2022-10-13 NOTE — Op Note (Signed)
Operative Note   DATE OF OPERATION: 11.7.2023  LOCATION: [facility]-observation  SURGICAL DIVISION: Plastic Surgery  PREOPERATIVE DIAGNOSES:  1. Pannicultis  POSTOPERATIVE DIAGNOSES:  same  PROCEDURE:  Panniculectomy  SURGEON: Irene Limbo MD MBA  ASSISTANT: none  ANESTHESIA:  General.   EBL: 35 ml  COMPLICATIONS: None immediate.   INDICATIONS FOR PROCEDURE:  The patient, Shelly Lane, is a 46 y.o. 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   FINDINGS: Soft tissue resection 1961 g  DESCRIPTION OF PROCEDURE:  The patient's caudal incision was marked over abdomen marked 7 cm from labial fourchette and extended over lateral abdomen. SQ heparin administered. The patient was taken to the operating room. SCDs were placed and IV antibiotics were given. The patient's operative site was prepped and draped in a sterile fashion. A time out was performed and all information was confirmed to be correct.     Low transverse abdominal incision carried through superficial fascia to abdominal wall. Skin flap elevated in sub Scarpa's layer, taking care to leave layer of subfascial fat over abdominal wall fascia. Dissection completed toward umbilicus. Umbilicus sharply incised and scissor dissection completed to free from abdominal skin flap. Additional dissection completed in midline toward xiphoid. Wound irrigated and hemostasis obtained. Local anesthetic infiltrated. 20 Fr JP placed in subcutaneous right and left abdomen and secured with 2-0 nylon. Diastasis recti imbricated with 0 V lock suture over both supra and infraumbilical abdomen. Patient then brought to semi sitting position. Caudal extent skin excision marked by palpation. Area marked excised. Superiorly based U shaped skin flap incised for delivery umbilicus. Low transverse abdominal skin incision closed with 0 PDS in superficial fascia. 0 V lock used to close  dermis and 4-0 monocryl for subcuticular skin closure. Umbilicus inset with 4-0 monocryl in dermis and 5-0 plain gut for skin closure. Xeroform bolster placed within umbilicus. Dermabond applied.   Dry dressing and abdominal binder applied. The patient was allowed to wake from anesthesia, extubated and taken to the recovery room in satisfactory condition.   SPECIMENS: none  DRAINS: 68 Fr JP in right and left subcutaneous abdomen  Irene Limbo, MD [facility] Plastic & Reconstructive Surgery  Office/ physician access line after hours [phone]

## 2022-10-13 NOTE — Anesthesia Preprocedure Evaluation (Signed)
Anesthesia Evaluation  Patient identified by MRN, date of birth, ID band Patient awake    Reviewed: Allergy & Precautions, NPO status , Patient's Chart, lab work & pertinent test results  History of Anesthesia Complications Negative for: history of anesthetic complications  Airway Mallampati: II  TM Distance: >3 FB Neck ROM: Full    Dental  (+) Dental Advisory Given   Pulmonary neg pulmonary ROS   Pulmonary exam normal        Cardiovascular negative cardio ROS Normal cardiovascular exam     Neuro/Psych  Headaches  Anxiety Depression       GI/Hepatic Neg liver ROS,GERD  ,,S/p gastric sleeve   Endo/Other  negative endocrine ROS    Renal/GU negative Renal ROS  negative genitourinary   Musculoskeletal negative musculoskeletal ROS (+)    Abdominal   Peds  Hematology negative hematology ROS (+)   Anesthesia Other Findings   Reproductive/Obstetrics                             Anesthesia Physical Anesthesia Plan  ASA: 2  Anesthesia Plan: General   Post-op Pain Management: Tylenol PO (pre-op)* and Toradol IV (intra-op)*   Induction: Intravenous  PONV Risk Score and Plan: 3 and Ondansetron, Dexamethasone, Treatment may vary due to age or medical condition and Midazolam  Airway Management Planned: Oral ETT  Additional Equipment: None  Intra-op Plan:   Post-operative Plan: Extubation in OR  Informed Consent: I have reviewed the patients History and Physical, chart, labs and discussed the procedure including the risks, benefits and alternatives for the proposed anesthesia with the patient or authorized representative who has indicated his/her understanding and acceptance.     Dental advisory given  Plan Discussed with:   Anesthesia Plan Comments:         Anesthesia Quick Evaluation

## 2022-10-13 NOTE — Anesthesia Procedure Notes (Signed)
Procedure Name: Intubation Date/Time: 10/13/2022 11:32 AM  Performed by: Willa Frater, CRNAPre-anesthesia Checklist: Patient identified, Emergency Drugs available, Suction available and Patient being monitored Patient Re-evaluated:Patient Re-evaluated prior to induction Oxygen Delivery Method: Circle system utilized Preoxygenation: Pre-oxygenation with 100% oxygen Induction Type: IV induction Ventilation: Mask ventilation without difficulty Laryngoscope Size: Mac and 3 Grade View: Grade I Tube type: Oral Tube size: 7.0 mm Number of attempts: 1 Airway Equipment and Method: Stylet and Oral airway Placement Confirmation: ETT inserted through vocal cords under direct vision, positive ETCO2 and breath sounds checked- equal and bilateral Secured at: 21 cm Tube secured with: Tape Dental Injury: Teeth and Oropharynx as per pre-operative assessment

## 2022-10-14 ENCOUNTER — Encounter (HOSPITAL_BASED_OUTPATIENT_CLINIC_OR_DEPARTMENT_OTHER): Payer: Self-pay | Admitting: Plastic Surgery

## 2022-10-14 DIAGNOSIS — F32A Depression, unspecified: Secondary | ICD-10-CM | POA: Diagnosis not present

## 2022-10-14 DIAGNOSIS — F419 Anxiety disorder, unspecified: Secondary | ICD-10-CM | POA: Diagnosis not present

## 2022-10-14 DIAGNOSIS — M793 Panniculitis, unspecified: Secondary | ICD-10-CM | POA: Diagnosis not present

## 2022-10-14 DIAGNOSIS — K219 Gastro-esophageal reflux disease without esophagitis: Secondary | ICD-10-CM | POA: Diagnosis not present

## 2022-10-14 DIAGNOSIS — Z9884 Bariatric surgery status: Secondary | ICD-10-CM | POA: Diagnosis not present

## 2022-10-14 NOTE — Discharge Summary (Signed)
Physician Discharge Summary  Patient ID: Shelly Lane MRN: [id number] DOB/AGE: September 17, 1976 46 y.o.  Admit date: 10/13/2022 Discharge date: 10/14/2022  Admission Diagnoses: Panniculitis  Discharge Diagnoses:  Principal Problem:   Panniculitis   Discharged Condition: stable  Hospital Course: Post operatively patient's pain controlled with oral medication. Patient was ambulatory with minimal assist and tolerated diet. She was instructed on bathing and drain care and activities.  Treatments: surgery: panniculectomy 11.7.23  Discharge Exam: Blood pressure 106/75, pulse (!) 106, temperature 99.2 F (37.3 C), resp. rate 16, height 5' 6.5" (1.689 m), weight 79.9 kg, last menstrual period 09/26/2022, SpO2 99 %. Incision/Wound: abdomen soft incisions intact drains serosanguinous  Disposition: Discharge disposition: 01-Home or Self Care       Discharge Instructions     Call MD for:  redness, tenderness, or signs of infection (pain, swelling, bleeding, redness, odor or green/yellow discharge around incision site)   Complete by: As directed    Call MD for:  temperature >100.5   Complete by: As directed    Discharge instructions   Complete by: As directed    Ok to remove dressings and shower am 11.9.23. Soap and water ok, pat incisions dry. No creams or ointments over incisions. Do not let drains dangle in shower, attach to lanyard or similar.Strip and record drains twice daily and bring log to clinic visit.  Abdominal binder or compression garment all other times.  Ok to raise arms above shoulders for bathing and dressing.  No house yard work or exercise until cleared by MD.   Patient receiving all Rx through pain provider. Recommend Miralax or Dulcolax as needed for constipation.  Recline, sleep with head elevated on 2-3 pillows and pillow beneath knees. Do not lie flat. Ambulate bent at hips.   Driving Restrictions   Complete by: As directed    No driving through follow up  visit   Lifting restrictions   Complete by: As directed    No lifting > 5-10 lbs until cleared by MD   Resume previous diet   Complete by: As directed       Allergies as of 10/14/2022       Reactions   Hydrocodone Itching   Hydroxyzine Hcl Other (See Comments)   "Made me feel like I was going nuts".    Pineapple Itching, Swelling   Per pt itching throat and tongue swelling   Reglan [metoclopramide]    "Made me very weak and feel jumpy"   Subutex [buprenorphine] Itching        Medication List     TAKE these medications    acetaminophen 500 MG tablet Commonly known as: TYLENOL Take 1 tablet (500 mg total) by mouth every 6 (six) hours as needed (pain).   conjugated estrogens vaginal cream Commonly known as: PREMARIN Place 0.5g nightly for two weeks then twice a week after   cyclobenzaprine 10 MG tablet Commonly known as: FLEXERIL Take 10 mg by mouth 3 (three) times daily as needed.   dicyclomine 10 MG capsule Commonly known as: BENTYL Take 10 mg by mouth 3 (three) times daily as needed.   escitalopram 10 MG tablet Commonly known as: LEXAPRO Take 10 mg by mouth at bedtime.   fluconazole 150 MG tablet Commonly known as: DIFLUCAN Take 1 tablet (150 mg total) by mouth once as needed for up to 1 dose (yeast infection).   fluticasone 50 MCG/ACT nasal spray Commonly known as: FLONASE Place 2 sprays into both nostrils daily as needed.   ibuprofen  600 MG tablet Commonly known as: ADVIL Take 1 tablet (600 mg total) by mouth every 6 (six) hours as needed.   loratadine 10 MG tablet Commonly known as: CLARITIN Take 10 mg by mouth daily as needed.   oxyCODONE HCl 15 MG Taba Take 1 tablet by mouth every 6 (six) hours as needed.   zolpidem 10 MG tablet Commonly known as: AMBIEN Take 10 mg by mouth at bedtime as needed for sleep.        Follow-up Information     Glenna Fellows, MD Follow up in 1 week(s).   Specialty: Plastic Surgery Why: as  scheduled Contact information: [address] [place] [address] [phone]                 Signed: Glenna Fellows 10/14/2022, 7:45 AM

## 2022-10-17 ENCOUNTER — Other Ambulatory Visit: Payer: Self-pay | Admitting: Obstetrics and Gynecology

## 2022-10-24 ENCOUNTER — Emergency Department (HOSPITAL_BASED_OUTPATIENT_CLINIC_OR_DEPARTMENT_OTHER)
Admission: EM | Admit: 2022-10-24 | Discharge: 2022-10-25 | Disposition: A | Discharge status: Home / Self Care | Payer: Medicaid Other | Attending: Emergency Medicine | Admitting: Emergency Medicine

## 2022-10-24 ENCOUNTER — Encounter (HOSPITAL_BASED_OUTPATIENT_CLINIC_OR_DEPARTMENT_OTHER): Payer: Self-pay

## 2022-10-24 ENCOUNTER — Other Ambulatory Visit: Payer: Self-pay

## 2022-10-24 DIAGNOSIS — T8131XA Disruption of external operation (surgical) wound, not elsewhere classified, initial encounter: Secondary | ICD-10-CM | POA: Insufficient documentation

## 2022-10-24 DIAGNOSIS — R609 Edema, unspecified: Secondary | ICD-10-CM

## 2022-10-24 DIAGNOSIS — R600 Localized edema: Secondary | ICD-10-CM | POA: Diagnosis not present

## 2022-10-24 DIAGNOSIS — Y69 Unspecified misadventure during surgical and medical care: Secondary | ICD-10-CM | POA: Insufficient documentation

## 2022-10-24 DIAGNOSIS — E778 Other disorders of glycoprotein metabolism: Secondary | ICD-10-CM | POA: Insufficient documentation

## 2022-10-24 DIAGNOSIS — T8130XA Disruption of wound, unspecified, initial encounter: Secondary | ICD-10-CM

## 2022-10-24 HISTORY — DX: Panniculitis, unspecified: M79.3

## 2022-10-24 HISTORY — DX: Other specified postprocedural states: Z98.890

## 2022-10-24 NOTE — ED Triage Notes (Signed)
POV, pt had skin removal surgery on 11/7, had fall earlier today and hit stomach, wound opened and now has clear fluid leaking from wound, pt called office and surgeon told her everything was okay and would see in office next week. Pt sts that legs are now swelling and wound is painful.

## 2022-10-24 NOTE — ED Provider Notes (Signed)
[facility] EMERGENCY DEPT  Provider Note  CSN: [id number] Arrival date & time: 10/24/22 2012  History Chief Complaint  Patient presents with   Fall   Wound Dehiscence    Shelly Lane is a 46 y.o. female with history of chronic back pain on long term oxycodone recently had a panniculectomy after significant weight loss. She reports she fell earlier and noticed one of her wounds had opened and was draining some clear fluid. She called her surgeon who told her she would see her in the office as scheduled on 11/22. She took a nap and when she woke up she noticed that her legs were swollen and that her abdomen seemed to be more distended and she became worried. She has not had any fevers,no vomiting.    Home Medications Prior to Admission medications   Medication Sig Start Date End Date Taking? Authorizing Provider  acetaminophen (TYLENOL) 500 MG tablet Take 1 tablet (500 mg total) by mouth every 6 (six) hours as needed (pain). 12/18/21   Marguerita Beards, MD  conjugated estrogens (PREMARIN) vaginal cream Place 0.5g nightly for two weeks then twice a week after 07/06/22   Marguerita Beards, MD  cyclobenzaprine (FLEXERIL) 10 MG tablet Take 10 mg by mouth 3 (three) times daily as needed. 12/21/20   [provider]  dicyclomine (BENTYL) 10 MG capsule Take 10 mg by mouth 3 (three) times daily as needed. 09/19/21   [provider]  escitalopram (LEXAPRO) 10 MG tablet Take 10 mg by mouth at bedtime. 09/15/21   [provider]  fluconazole (DIFLUCAN) 150 MG tablet Take 1 tablet (150 mg total) by mouth once as needed for up to 1 dose (yeast infection). 05/06/22   Marguerita Beards, MD  fluticasone [facility]) 50 MCG/ACT nasal spray Place 2 sprays into both nostrils daily as needed. 09/22/20   [provider]  ibuprofen (ADVIL) 600 MG tablet Take 1 tablet (600 mg total) by mouth every 6 (six) hours as needed. 12/18/21   Marguerita Beards,  MD  loratadine (CLARITIN) 10 MG tablet Take 10 mg by mouth daily as needed. 09/22/20   [provider]  oxyCODONE HCl 15 MG TABA Take 1 tablet by mouth every 6 (six) hours as needed.    [provider]  zolpidem (AMBIEN) 10 MG tablet Take 10 mg by mouth at bedtime as needed for sleep.    [provider]     Allergies    Hydrocodone, Hydroxyzine hcl, Pineapple, Reglan [metoclopramide], and Subutex [buprenorphine]   Review of Systems   Review of Systems Please see HPI for pertinent positives and negatives  Physical Exam BP 122/80 (BP Location: Right Arm)   Pulse (!) 102   Temp 99.6 F (37.6 C) (Oral)   Resp 18   Ht 5\' 6"  (1.676 m)   Wt 78 kg   LMP 10/15/2022 (Exact Date)   SpO2 100%   BMI 27.76 kg/m   Physical Exam Vitals and nursing note reviewed.  Constitutional:      Appearance: Normal appearance.  HENT:     Head: Normocephalic and atraumatic.     Nose: Nose normal.     Mouth/Throat:     Mouth: Mucous membranes are moist.  Eyes:     Extraocular Movements: Extraocular movements intact.     Conjunctiva/sclera: Conjunctivae normal.  Cardiovascular:     Rate and Rhythm: Normal rate.  Pulmonary:     Effort: Pulmonary effort is normal.     Breath  sounds: Normal breath sounds.  Abdominal:     General: Abdomen is flat.     Palpations: Abdomen is soft. There is no mass.     Tenderness: There is no abdominal tenderness. There is no guarding.     Comments: Partial wound dehiscence of the incision in LLQ, no signs of bleeding or infection.   Musculoskeletal:        General: No swelling. Normal range of motion.     Cervical back: Neck supple.     Comments: Trace bilateral LE edema of feet and ankles, symmetric  Skin:    General: Skin is warm and dry.  Neurological:     General: No focal deficit present.     Mental Status: She is alert.  Psychiatric:        Mood and Affect: Mood normal.     ED Results / Procedures / Treatments    EKG None  Procedures Procedures  Medications Ordered in the ED Medications - No data to display  Initial Impression and Plan  Patient here with concerns regarding swelling in her abdomen and feet/ankles that she attributes to a fall she had earlier today. There is a mild wound dehiscence, but no bleeding or signs of infection. Will check basic labs to ensure no emergent medical condition is present. Exam is overall reassuring, no concern for DVT or bowel obstruction by exam.   ED Course   Clinical Course as of 10/25/22 0123  Sun Oct 25, 2022  0029 CBC with normal WBC, mild anemia, lower than previous but not in need of transfusion.  [CS]  0121 CMP with moderate low protein may be contributing to her edema. Patient advised to use protein shakes to increase her intake. Follow up with PCP for re-evaluation and long term management. Plastics follow up for wound care.  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 Decision-making details documented in ED Course.    Final Clinical Impression(s) / ED Diagnoses Final diagnoses:  Wound dehiscence  Peripheral edema  Hypoproteinemia (HCC)    Rx / DC Orders ED Discharge Orders     None        Pollyann Savoy, MD 10/25/22 [phone]

## 2022-10-24 NOTE — ED Notes (Signed)
Patient given warm blanket per request. 

## 2022-10-25 LAB — CBC WITH DIFFERENTIAL/PLATELET
Abs Immature Granulocytes: 0.01 10*3/uL (ref 0.00–0.07)
Basophils Absolute: 0 10*3/uL (ref 0.0–0.1)
Basophils Relative: 1 %
Eosinophils Absolute: 0.1 10*3/uL (ref 0.0–0.5)
Eosinophils Relative: 1 %
HCT: 31.2 % — ABNORMAL LOW (ref 36.0–46.0)
Hemoglobin: 9.9 g/dL — ABNORMAL LOW (ref 12.0–15.0)
Immature Granulocytes: 0 %
Lymphocytes Relative: 43 %
Lymphs Abs: 2.5 10*3/uL (ref 0.7–4.0)
MCH: 26.5 pg (ref 26.0–34.0)
MCHC: 31.7 g/dL (ref 30.0–36.0)
MCV: 83.4 fL (ref 80.0–100.0)
Monocytes Absolute: 0.4 10*3/uL (ref 0.1–1.0)
Monocytes Relative: 7 %
Neutro Abs: 2.8 10*3/uL (ref 1.7–7.7)
Neutrophils Relative %: 48 %
Platelets: 327 10*3/uL (ref 150–400)
RBC: 3.74 MIL/uL — ABNORMAL LOW (ref 3.87–5.11)
RDW: 17.6 % — ABNORMAL HIGH (ref 11.5–15.5)
WBC: 5.8 10*3/uL (ref 4.0–10.5)
nRBC: 0 % (ref 0.0–0.2)

## 2022-10-25 LAB — COMPREHENSIVE METABOLIC PANEL WITH GFR
ALT: 20 U/L (ref 0–44)
AST: 17 U/L (ref 15–41)
Albumin: 2.3 g/dL — ABNORMAL LOW (ref 3.5–5.0)
Alkaline Phosphatase: 133 U/L — ABNORMAL HIGH (ref 38–126)
Anion gap: 7 (ref 5–15)
BUN: 10 mg/dL (ref 6–20)
CO2: 25 mmol/L (ref 22–32)
Calcium: 7.7 mg/dL — ABNORMAL LOW (ref 8.9–10.3)
Chloride: 106 mmol/L (ref 98–111)
Creatinine, Ser: 0.57 mg/dL (ref 0.44–1.00)
GFR, Estimated: >60 mL/min
Glucose, Bld: 100 mg/dL — ABNORMAL HIGH (ref 70–99)
Potassium: 3.4 mmol/L — ABNORMAL LOW (ref 3.5–5.1)
Sodium: 138 mmol/L (ref 135–145)
Total Bilirubin: 0.4 mg/dL (ref 0.3–1.2)
Total Protein: 5.2 g/dL — ABNORMAL LOW (ref 6.5–8.1)

## 2022-10-25 NOTE — ED Notes (Signed)
Pt agreeable with d/c plan as discussed by provider- this nurse has verbally reinforced d/c instructions and provided pt with written copy.  Pt acknowledges verbal understanding and denies any add'l questions, concerns, needs- pt ambulatory independently at d/c; gait steady; vitals stable and no distress noted.

## 2022-10-27 DIAGNOSIS — M545 Low back pain, unspecified: Secondary | ICD-10-CM | POA: Diagnosis not present

## 2022-10-27 DIAGNOSIS — J3081 Allergic rhinitis due to animal (cat) (dog) hair and dander: Secondary | ICD-10-CM | POA: Diagnosis not present

## 2022-10-27 DIAGNOSIS — G894 Chronic pain syndrome: Secondary | ICD-10-CM | POA: Diagnosis not present

## 2022-10-27 DIAGNOSIS — J301 Allergic rhinitis due to pollen: Secondary | ICD-10-CM | POA: Diagnosis not present

## 2022-10-27 DIAGNOSIS — M25552 Pain in left hip: Secondary | ICD-10-CM | POA: Diagnosis not present

## 2022-10-27 DIAGNOSIS — M79662 Pain in left lower leg: Secondary | ICD-10-CM | POA: Diagnosis not present

## 2022-10-27 DIAGNOSIS — M25551 Pain in right hip: Secondary | ICD-10-CM | POA: Diagnosis not present

## 2022-10-27 DIAGNOSIS — M79661 Pain in right lower leg: Secondary | ICD-10-CM | POA: Diagnosis not present

## 2022-10-27 DIAGNOSIS — J3089 Other allergic rhinitis: Secondary | ICD-10-CM | POA: Diagnosis not present

## 2022-11-06 DIAGNOSIS — Z419 Encounter for procedure for purposes other than remedying health state, unspecified: Secondary | ICD-10-CM | POA: Diagnosis not present

## 2022-11-24 DIAGNOSIS — G894 Chronic pain syndrome: Secondary | ICD-10-CM | POA: Diagnosis not present

## 2022-12-07 DIAGNOSIS — Z419 Encounter for procedure for purposes other than remedying health state, unspecified: Secondary | ICD-10-CM | POA: Diagnosis not present

## 2022-12-17 DIAGNOSIS — Z9884 Bariatric surgery status: Secondary | ICD-10-CM | POA: Diagnosis not present

## 2022-12-17 DIAGNOSIS — K912 Postsurgical malabsorption, not elsewhere classified: Secondary | ICD-10-CM | POA: Diagnosis not present

## 2022-12-17 DIAGNOSIS — R5383 Other fatigue: Secondary | ICD-10-CM | POA: Diagnosis not present

## 2022-12-17 DIAGNOSIS — E611 Iron deficiency: Secondary | ICD-10-CM | POA: Diagnosis not present

## 2022-12-17 DIAGNOSIS — Z903 Acquired absence of stomach [part of]: Secondary | ICD-10-CM | POA: Diagnosis not present

## 2022-12-24 DIAGNOSIS — E611 Iron deficiency: Secondary | ICD-10-CM | POA: Diagnosis not present

## 2022-12-24 DIAGNOSIS — Z9884 Bariatric surgery status: Secondary | ICD-10-CM | POA: Diagnosis not present

## 2022-12-24 DIAGNOSIS — R5383 Other fatigue: Secondary | ICD-10-CM | POA: Diagnosis not present

## 2022-12-25 DIAGNOSIS — D509 Iron deficiency anemia, unspecified: Secondary | ICD-10-CM | POA: Insufficient documentation

## 2023-01-02 DIAGNOSIS — J029 Acute pharyngitis, unspecified: Secondary | ICD-10-CM | POA: Diagnosis not present

## 2023-01-05 ENCOUNTER — Ambulatory Visit: Payer: Medicaid Other | Admitting: Obstetrics and Gynecology

## 2023-01-06 DIAGNOSIS — D509 Iron deficiency anemia, unspecified: Secondary | ICD-10-CM | POA: Diagnosis not present

## 2023-01-07 DIAGNOSIS — Z419 Encounter for procedure for purposes other than remedying health state, unspecified: Secondary | ICD-10-CM | POA: Diagnosis not present

## 2023-01-13 DIAGNOSIS — D509 Iron deficiency anemia, unspecified: Secondary | ICD-10-CM | POA: Diagnosis not present

## 2023-01-19 DIAGNOSIS — M25552 Pain in left hip: Secondary | ICD-10-CM | POA: Diagnosis not present

## 2023-01-19 DIAGNOSIS — J301 Allergic rhinitis due to pollen: Secondary | ICD-10-CM | POA: Diagnosis not present

## 2023-01-19 DIAGNOSIS — G894 Chronic pain syndrome: Secondary | ICD-10-CM | POA: Diagnosis not present

## 2023-01-19 DIAGNOSIS — M545 Low back pain, unspecified: Secondary | ICD-10-CM | POA: Diagnosis not present

## 2023-01-19 DIAGNOSIS — M79661 Pain in right lower leg: Secondary | ICD-10-CM | POA: Diagnosis not present

## 2023-01-19 DIAGNOSIS — J3089 Other allergic rhinitis: Secondary | ICD-10-CM | POA: Diagnosis not present

## 2023-01-19 DIAGNOSIS — J3081 Allergic rhinitis due to animal (cat) (dog) hair and dander: Secondary | ICD-10-CM | POA: Diagnosis not present

## 2023-01-19 DIAGNOSIS — M25551 Pain in right hip: Secondary | ICD-10-CM | POA: Diagnosis not present

## 2023-01-19 DIAGNOSIS — M79662 Pain in left lower leg: Secondary | ICD-10-CM | POA: Diagnosis not present

## 2023-01-20 DIAGNOSIS — D509 Iron deficiency anemia, unspecified: Secondary | ICD-10-CM | POA: Diagnosis not present

## 2023-01-25 ENCOUNTER — Encounter: Payer: Self-pay | Admitting: *Deleted

## 2023-01-27 DIAGNOSIS — Z9884 Bariatric surgery status: Secondary | ICD-10-CM | POA: Diagnosis not present

## 2023-01-27 DIAGNOSIS — L905 Scar conditions and fibrosis of skin: Secondary | ICD-10-CM | POA: Diagnosis not present

## 2023-01-27 DIAGNOSIS — Z9889 Other specified postprocedural states: Secondary | ICD-10-CM | POA: Diagnosis not present

## 2023-02-05 DIAGNOSIS — Z419 Encounter for procedure for purposes other than remedying health state, unspecified: Secondary | ICD-10-CM | POA: Diagnosis not present

## 2023-02-08 ENCOUNTER — Encounter (HOSPITAL_COMMUNITY): Payer: Self-pay

## 2023-02-08 ENCOUNTER — Encounter (HOSPITAL_COMMUNITY): Admission: EM | Disposition: A | Discharge status: Home / Self Care | Payer: Self-pay | Source: Home / Self Care

## 2023-02-08 ENCOUNTER — Inpatient Hospital Stay (HOSPITAL_COMMUNITY)
Admission: EM | Admit: 2023-02-08 | Discharge: 2023-02-12 | DRG: 330 | Disposition: A | Discharge status: Home / Self Care | Payer: Medicaid Other | Attending: General Surgery | Admitting: General Surgery

## 2023-02-08 ENCOUNTER — Emergency Department (HOSPITAL_COMMUNITY): Payer: Medicaid Other | Admitting: Certified Registered"

## 2023-02-08 ENCOUNTER — Other Ambulatory Visit: Payer: Self-pay

## 2023-02-08 ENCOUNTER — Emergency Department (HOSPITAL_COMMUNITY): Payer: Medicaid Other

## 2023-02-08 DIAGNOSIS — Z8759 Personal history of other complications of pregnancy, childbirth and the puerperium: Secondary | ICD-10-CM | POA: Insufficient documentation

## 2023-02-08 DIAGNOSIS — R188 Other ascites: Secondary | ICD-10-CM | POA: Diagnosis not present

## 2023-02-08 DIAGNOSIS — K219 Gastro-esophageal reflux disease without esophagitis: Secondary | ICD-10-CM | POA: Diagnosis present

## 2023-02-08 DIAGNOSIS — O09529 Supervision of elderly multigravida, unspecified trimester: Secondary | ICD-10-CM | POA: Insufficient documentation

## 2023-02-08 DIAGNOSIS — K279 Peptic ulcer, site unspecified, unspecified as acute or chronic, without hemorrhage or perforation: Secondary | ICD-10-CM | POA: Diagnosis not present

## 2023-02-08 DIAGNOSIS — R000 Tachycardia, unspecified: Secondary | ICD-10-CM | POA: Diagnosis not present

## 2023-02-08 DIAGNOSIS — G8929 Other chronic pain: Secondary | ICD-10-CM | POA: Diagnosis present

## 2023-02-08 DIAGNOSIS — R109 Unspecified abdominal pain: Secondary | ICD-10-CM | POA: Diagnosis not present

## 2023-02-08 DIAGNOSIS — Z79899 Other long term (current) drug therapy: Secondary | ICD-10-CM

## 2023-02-08 DIAGNOSIS — Z980 Intestinal bypass and anastomosis status: Secondary | ICD-10-CM

## 2023-02-08 DIAGNOSIS — K289 Gastrojejunal ulcer, unspecified as acute or chronic, without hemorrhage or perforation: Secondary | ICD-10-CM | POA: Diagnosis present

## 2023-02-08 DIAGNOSIS — Z9884 Bariatric surgery status: Secondary | ICD-10-CM

## 2023-02-08 DIAGNOSIS — K5909 Other constipation: Secondary | ICD-10-CM | POA: Insufficient documentation

## 2023-02-08 DIAGNOSIS — Z8249 Family history of ischemic heart disease and other diseases of the circulatory system: Secondary | ICD-10-CM

## 2023-02-08 DIAGNOSIS — K589 Irritable bowel syndrome without diarrhea: Secondary | ICD-10-CM | POA: Diagnosis present

## 2023-02-08 DIAGNOSIS — Z743 Need for continuous supervision: Secondary | ICD-10-CM | POA: Diagnosis not present

## 2023-02-08 DIAGNOSIS — K562 Volvulus: Principal | ICD-10-CM | POA: Diagnosis present

## 2023-02-08 DIAGNOSIS — Z8616 Personal history of COVID-19: Secondary | ICD-10-CM | POA: Diagnosis not present

## 2023-02-08 DIAGNOSIS — K450 Other specified abdominal hernia with obstruction, without gangrene: Secondary | ICD-10-CM | POA: Diagnosis present

## 2023-02-08 DIAGNOSIS — Z889 Allergy status to unspecified drugs, medicaments and biological substances status: Secondary | ICD-10-CM | POA: Insufficient documentation

## 2023-02-08 DIAGNOSIS — R002 Palpitations: Secondary | ICD-10-CM | POA: Insufficient documentation

## 2023-02-08 DIAGNOSIS — Z888 Allergy status to other drugs, medicaments and biological substances status: Secondary | ICD-10-CM

## 2023-02-08 DIAGNOSIS — Z833 Family history of diabetes mellitus: Secondary | ICD-10-CM | POA: Diagnosis not present

## 2023-02-08 DIAGNOSIS — R918 Other nonspecific abnormal finding of lung field: Secondary | ICD-10-CM | POA: Diagnosis not present

## 2023-02-08 DIAGNOSIS — F32A Depression, unspecified: Secondary | ICD-10-CM | POA: Diagnosis present

## 2023-02-08 DIAGNOSIS — K469 Unspecified abdominal hernia without obstruction or gangrene: Secondary | ICD-10-CM | POA: Diagnosis not present

## 2023-02-08 DIAGNOSIS — Z5331 Laparoscopic surgical procedure converted to open procedure: Secondary | ICD-10-CM

## 2023-02-08 DIAGNOSIS — K56609 Unspecified intestinal obstruction, unspecified as to partial versus complete obstruction: Principal | ICD-10-CM

## 2023-02-08 DIAGNOSIS — R7303 Prediabetes: Secondary | ICD-10-CM | POA: Diagnosis present

## 2023-02-08 DIAGNOSIS — Z8632 Personal history of gestational diabetes: Secondary | ICD-10-CM | POA: Diagnosis not present

## 2023-02-08 DIAGNOSIS — Z9049 Acquired absence of other specified parts of digestive tract: Secondary | ICD-10-CM | POA: Diagnosis not present

## 2023-02-08 DIAGNOSIS — Z91018 Allergy to other foods: Secondary | ICD-10-CM | POA: Diagnosis not present

## 2023-02-08 DIAGNOSIS — M549 Dorsalgia, unspecified: Secondary | ICD-10-CM | POA: Diagnosis not present

## 2023-02-08 DIAGNOSIS — F419 Anxiety disorder, unspecified: Secondary | ICD-10-CM

## 2023-02-08 DIAGNOSIS — Z4659 Encounter for fitting and adjustment of other gastrointestinal appliance and device: Secondary | ICD-10-CM | POA: Diagnosis not present

## 2023-02-08 DIAGNOSIS — K458 Other specified abdominal hernia without obstruction or gangrene: Secondary | ICD-10-CM | POA: Diagnosis not present

## 2023-02-08 DIAGNOSIS — Z885 Allergy status to narcotic agent status: Secondary | ICD-10-CM

## 2023-02-08 DIAGNOSIS — Z8711 Personal history of peptic ulcer disease: Secondary | ICD-10-CM

## 2023-02-08 DIAGNOSIS — Z9889 Other specified postprocedural states: Secondary | ICD-10-CM

## 2023-02-08 DIAGNOSIS — R1084 Generalized abdominal pain: Secondary | ICD-10-CM | POA: Diagnosis not present

## 2023-02-08 HISTORY — PX: LAPAROSCOPY: SHX197

## 2023-02-08 LAB — URINALYSIS, ROUTINE W REFLEX MICROSCOPIC
Bacteria, UA: NONE SEEN
Bilirubin Urine: NEGATIVE
Glucose, UA: NEGATIVE mg/dL
Hgb urine dipstick: NEGATIVE
Ketones, ur: NEGATIVE mg/dL
Nitrite: NEGATIVE
Protein, ur: NEGATIVE mg/dL
Specific Gravity, Urine: 1.019 (ref 1.005–1.030)
pH: 5 (ref 5.0–8.0)

## 2023-02-08 LAB — CBC
HCT: 38.4 % (ref 36.0–46.0)
Hemoglobin: 12 g/dL (ref 12.0–15.0)
MCH: 27.6 pg (ref 26.0–34.0)
MCHC: 31.3 g/dL (ref 30.0–36.0)
MCV: 88.5 fL (ref 80.0–100.0)
Platelets: 358 10*3/uL (ref 150–400)
RBC: 4.34 MIL/uL (ref 3.87–5.11)
RDW: 17.1 % — ABNORMAL HIGH (ref 11.5–15.5)
WBC: 9.3 10*3/uL (ref 4.0–10.5)
nRBC: 0 % (ref 0.0–0.2)

## 2023-02-08 LAB — TYPE AND SCREEN
ABO/RH(D): O POS
Antibody Screen: NEGATIVE

## 2023-02-08 LAB — COMPREHENSIVE METABOLIC PANEL
ALT: 44 U/L (ref 0–44)
AST: 39 U/L (ref 15–41)
Albumin: 2.6 g/dL — ABNORMAL LOW (ref 3.5–5.0)
Alkaline Phosphatase: 126 U/L (ref 38–126)
Anion gap: 8 (ref 5–15)
BUN: 14 mg/dL (ref 6–20)
CO2: 25 mmol/L (ref 22–32)
Calcium: 8.2 mg/dL — ABNORMAL LOW (ref 8.9–10.3)
Chloride: 101 mmol/L (ref 98–111)
Creatinine, Ser: 0.53 mg/dL (ref 0.44–1.00)
GFR, Estimated: >60 mL/min (ref >60)
Glucose, Bld: 87 mg/dL (ref 70–99)
Potassium: 3.1 mmol/L — ABNORMAL LOW (ref 3.5–5.1)
Sodium: 134 mmol/L — ABNORMAL LOW (ref 135–145)
Total Bilirubin: 0.7 mg/dL (ref 0.3–1.2)
Total Protein: 6.4 g/dL — ABNORMAL LOW (ref 6.5–8.1)

## 2023-02-08 LAB — I-STAT BETA HCG BLOOD, ED (MC, WL, AP ONLY): I-stat hCG, quantitative: <5 m[IU]/mL (ref <5)

## 2023-02-08 LAB — LIPASE, BLOOD: Lipase: 26 U/L (ref 11–51)

## 2023-02-08 SURGERY — LAPAROSCOPY, DIAGNOSTIC
Anesthesia: General

## 2023-02-08 MED ORDER — IOHEXOL 300 MG/ML  SOLN
100.0000 mL | Freq: Once | INTRAMUSCULAR | Status: AC | PRN
  Administered 2023-02-08: 100 mL via INTRAVENOUS

## 2023-02-08 MED ORDER — KETOROLAC TROMETHAMINE 30 MG/ML IJ SOLN: 30.0000 mg | Freq: Four times a day (QID) | INTRAMUSCULAR | Status: DC | PRN

## 2023-02-08 MED ORDER — DEXAMETHASONE SODIUM PHOSPHATE 4 MG/ML IJ SOLN
INTRAMUSCULAR | Status: DC | PRN
  Administered 2023-02-08: 8 mg via INTRAVENOUS

## 2023-02-08 MED ORDER — ONDANSETRON HCL 4 MG/2ML IJ SOLN
INTRAMUSCULAR | Status: DC | PRN
  Administered 2023-02-08: 4 mg via INTRAVENOUS

## 2023-02-08 MED ORDER — KETAMINE HCL 50 MG/5ML IJ SOSY
24.0000 mg | PREFILLED_SYRINGE | Freq: Once | INTRAMUSCULAR | Status: AC
  Administered 2023-02-08: 24 mg via INTRAVENOUS
  Filled 2023-02-08: qty 5

## 2023-02-08 MED ORDER — AMISULPRIDE (ANTIEMETIC) 5 MG/2ML IV SOLN: 10.0000 mg | Freq: Once | INTRAVENOUS | Status: DC | PRN

## 2023-02-08 MED ORDER — MEPERIDINE HCL 50 MG/ML IJ SOLN: 6.2500 mg | INTRAMUSCULAR | Status: DC | PRN

## 2023-02-08 MED ORDER — LACTATED RINGERS IR SOLN
Status: DC | PRN
  Administered 2023-02-08: 1000 mL

## 2023-02-08 MED ORDER — MIDAZOLAM HCL 2 MG/2ML IJ SOLN
INTRAMUSCULAR | Status: AC
  Filled 2023-02-08: qty 2

## 2023-02-08 MED ORDER — HYDROMORPHONE HCL 1 MG/ML IJ SOLN
1.0000 mg | Freq: Once | INTRAMUSCULAR | Status: AC
  Administered 2023-02-08: 1 mg via INTRAVENOUS
  Filled 2023-02-08: qty 1

## 2023-02-08 MED ORDER — FENTANYL CITRATE (PF) 250 MCG/5ML IJ SOLN
INTRAMUSCULAR | Status: AC
  Filled 2023-02-08: qty 5

## 2023-02-08 MED ORDER — CHLORHEXIDINE GLUCONATE CLOTH 2 % EX PADS
6.0000 | MEDICATED_PAD | Freq: Once | CUTANEOUS | Status: AC
  Administered 2023-02-08: 6 via TOPICAL

## 2023-02-08 MED ORDER — LACTATED RINGERS IV SOLN: INTRAVENOUS | Status: DC | PRN

## 2023-02-08 MED ORDER — ONDANSETRON 4 MG PO TBDP: 4.0000 mg | ORAL_TABLET | Freq: Four times a day (QID) | ORAL | Status: DC | PRN

## 2023-02-08 MED ORDER — SCOPOLAMINE 1 MG/3DAYS TD PT72
MEDICATED_PATCH | TRANSDERMAL | Status: AC
  Filled 2023-02-08: qty 1

## 2023-02-08 MED ORDER — PROPOFOL 10 MG/ML IV BOLUS
INTRAVENOUS | Status: DC | PRN
  Administered 2023-02-08: 200 mg via INTRAVENOUS

## 2023-02-08 MED ORDER — BUPIVACAINE HCL 0.25 % IJ SOLN
INTRAMUSCULAR | Status: AC
  Filled 2023-02-08: qty 1

## 2023-02-08 MED ORDER — ALBUMIN HUMAN 5 % IV SOLN: INTRAVENOUS | Status: DC | PRN

## 2023-02-08 MED ORDER — 0.9 % SODIUM CHLORIDE (POUR BTL) OPTIME
TOPICAL | Status: DC | PRN
  Administered 2023-02-08: 1000 mL

## 2023-02-08 MED ORDER — HYDROMORPHONE HCL 1 MG/ML IJ SOLN: 1.0000 mg | Freq: Once | INTRAMUSCULAR | Status: DC

## 2023-02-08 MED ORDER — PANTOPRAZOLE SODIUM 40 MG IV SOLR
40.0000 mg | Freq: Once | INTRAVENOUS | Status: AC
  Administered 2023-02-08: 40 mg via INTRAVENOUS
  Filled 2023-02-08: qty 10

## 2023-02-08 MED ORDER — HYDROMORPHONE HCL 1 MG/ML IJ SOLN
INTRAMUSCULAR | Status: AC
  Filled 2023-02-08: qty 1

## 2023-02-08 MED ORDER — OXYCODONE HCL 5 MG PO TABS: 5.0000 mg | ORAL_TABLET | Freq: Once | ORAL | Status: DC | PRN

## 2023-02-08 MED ORDER — PHENYLEPHRINE 80 MCG/ML (10ML) SYRINGE FOR IV PUSH (FOR BLOOD PRESSURE SUPPORT)
PREFILLED_SYRINGE | INTRAVENOUS | Status: DC | PRN
  Administered 2023-02-08: 80 ug via INTRAVENOUS
  Administered 2023-02-08: 160 ug via INTRAVENOUS
  Administered 2023-02-08 (×2): 80 ug via INTRAVENOUS
  Administered 2023-02-08: 160 ug via INTRAVENOUS

## 2023-02-08 MED ORDER — LIDOCAINE 2% (20 MG/ML) 5 ML SYRINGE
INTRAMUSCULAR | Status: DC | PRN
  Administered 2023-02-08: 60 mg via INTRAVENOUS

## 2023-02-08 MED ORDER — FENTANYL CITRATE (PF) 100 MCG/2ML IJ SOLN
INTRAMUSCULAR | Status: DC | PRN
  Administered 2023-02-08 (×2): 50 ug via INTRAVENOUS

## 2023-02-08 MED ORDER — BUPIVACAINE HCL (PF) 0.25 % IJ SOLN
INTRAMUSCULAR | Status: DC | PRN
  Administered 2023-02-08: 7 mL

## 2023-02-08 MED ORDER — SUCCINYLCHOLINE CHLORIDE 200 MG/10ML IV SOSY
PREFILLED_SYRINGE | INTRAVENOUS | Status: DC | PRN
  Administered 2023-02-08: 120 mg via INTRAVENOUS

## 2023-02-08 MED ORDER — SIMETHICONE 40 MG/0.6ML PO SUSP (UNIT DOSE)
40.0000 mg | Freq: Once | ORAL | Status: AC
  Administered 2023-02-08: 40 mg via ORAL
  Filled 2023-02-08: qty 0.6

## 2023-02-08 MED ORDER — ONDANSETRON HCL 4 MG/2ML IJ SOLN
4.0000 mg | Freq: Once | INTRAMUSCULAR | Status: AC
  Administered 2023-02-08: 4 mg via INTRAVENOUS
  Filled 2023-02-08: qty 2

## 2023-02-08 MED ORDER — PANTOPRAZOLE SODIUM 40 MG IV SOLR
40.0000 mg | Freq: Two times a day (BID) | INTRAVENOUS | Status: DC
  Administered 2023-02-08 – 2023-02-09 (×2): 40 mg via INTRAVENOUS
  Filled 2023-02-08 (×2): qty 10

## 2023-02-08 MED ORDER — SODIUM CHLORIDE 0.9 % IV SOLN
2.0000 g | INTRAVENOUS | Status: AC
  Administered 2023-02-08: 2 g via INTRAVENOUS
  Filled 2023-02-08: qty 2

## 2023-02-08 MED ORDER — ONDANSETRON HCL 4 MG/2ML IJ SOLN: 4.0000 mg | Freq: Once | INTRAMUSCULAR | Status: DC | PRN

## 2023-02-08 MED ORDER — ENOXAPARIN SODIUM 40 MG/0.4ML IJ SOSY
40.0000 mg | PREFILLED_SYRINGE | INTRAMUSCULAR | Status: DC
  Administered 2023-02-09 – 2023-02-12 (×4): 40 mg via SUBCUTANEOUS
  Filled 2023-02-08 (×4): qty 0.4

## 2023-02-08 MED ORDER — PHENYLEPHRINE HCL (PRESSORS) 10 MG/ML IV SOLN
INTRAVENOUS | Status: AC
  Filled 2023-02-08: qty 1

## 2023-02-08 MED ORDER — SCOPOLAMINE 1 MG/3DAYS TD PT72
MEDICATED_PATCH | TRANSDERMAL | Status: DC | PRN
  Administered 2023-02-08: 1 via TRANSDERMAL

## 2023-02-08 MED ORDER — BUPIVACAINE LIPOSOME 1.3 % IJ SUSP
INTRAMUSCULAR | Status: AC
  Filled 2023-02-08: qty 20

## 2023-02-08 MED ORDER — ONDANSETRON HCL 4 MG/2ML IJ SOLN: 4.0000 mg | Freq: Four times a day (QID) | INTRAMUSCULAR | Status: DC | PRN

## 2023-02-08 MED ORDER — PROPOFOL 10 MG/ML IV BOLUS
INTRAVENOUS | Status: AC
  Filled 2023-02-08: qty 20

## 2023-02-08 MED ORDER — HYDROMORPHONE HCL 1 MG/ML IJ SOLN
0.2500 mg | INTRAMUSCULAR | Status: DC | PRN
  Administered 2023-02-08 (×2): 0.5 mg via INTRAVENOUS

## 2023-02-08 MED ORDER — SODIUM CHLORIDE 0.9 % IV BOLUS: 1000.0000 mL | Freq: Once | INTRAVENOUS | Status: DC

## 2023-02-08 MED ORDER — DEXTROSE-NACL 5-0.9 % IV SOLN: INTRAVENOUS | Status: DC

## 2023-02-08 MED ORDER — ACETAMINOPHEN 10 MG/ML IV SOLN
INTRAVENOUS | Status: AC
  Filled 2023-02-08: qty 100

## 2023-02-08 MED ORDER — MIDAZOLAM HCL 5 MG/5ML IJ SOLN
INTRAMUSCULAR | Status: DC | PRN
  Administered 2023-02-08 (×2): 1 mg via INTRAVENOUS

## 2023-02-08 MED ORDER — KETAMINE HCL 50 MG/5ML IJ SOSY
PREFILLED_SYRINGE | INTRAMUSCULAR | Status: AC
  Filled 2023-02-08: qty 5

## 2023-02-08 MED ORDER — LACTATED RINGERS IV BOLUS
1000.0000 mL | Freq: Once | INTRAVENOUS | Status: AC
  Administered 2023-02-08: 1000 mL via INTRAVENOUS

## 2023-02-08 MED ORDER — KETAMINE HCL 10 MG/ML IJ SOLN
INTRAMUSCULAR | Status: DC | PRN
  Administered 2023-02-08: 20 mg via INTRAVENOUS

## 2023-02-08 MED ORDER — OXYCODONE HCL 5 MG/5ML PO SOLN: 5.0000 mg | Freq: Once | ORAL | Status: DC | PRN

## 2023-02-08 MED ORDER — DEXMEDETOMIDINE HCL IN NACL 80 MCG/20ML IV SOLN
INTRAVENOUS | Status: DC | PRN
  Administered 2023-02-08: 4 ug via BUCCAL
  Administered 2023-02-08: 16 ug via BUCCAL

## 2023-02-08 MED ORDER — HYDROMORPHONE HCL 1 MG/ML IJ SOLN
1.0000 mg | INTRAMUSCULAR | Status: DC | PRN
  Administered 2023-02-08 (×2): 2 mg via INTRAVENOUS
  Administered 2023-02-08: 1 mg via INTRAVENOUS
  Administered 2023-02-08 – 2023-02-09 (×2): 2 mg via INTRAVENOUS
  Filled 2023-02-08: qty 1
  Filled 2023-02-08 (×4): qty 2

## 2023-02-08 MED ORDER — SUGAMMADEX SODIUM 200 MG/2ML IV SOLN
INTRAVENOUS | Status: DC | PRN
  Administered 2023-02-08: 200 mg via INTRAVENOUS

## 2023-02-08 MED ORDER — ROCURONIUM BROMIDE 10 MG/ML (PF) SYRINGE
PREFILLED_SYRINGE | INTRAVENOUS | Status: DC | PRN
  Administered 2023-02-08: 50 mg via INTRAVENOUS

## 2023-02-08 MED ORDER — SUCRALFATE 1 G PO TABS
1.0000 g | ORAL_TABLET | Freq: Once | ORAL | Status: AC
  Administered 2023-02-08: 1 g via ORAL
  Filled 2023-02-08: qty 1

## 2023-02-08 MED ORDER — LACTATED RINGERS IV BOLUS: 1000.0000 mL | Freq: Three times a day (TID) | INTRAVENOUS | Status: AC | PRN

## 2023-02-08 MED ORDER — DEXMEDETOMIDINE HCL IN NACL 80 MCG/20ML IV SOLN
INTRAVENOUS | Status: AC
  Filled 2023-02-08: qty 20

## 2023-02-08 SURGICAL SUPPLY — 46 items
BAG COUNTER SPONGE SURGICOUNT (BAG) IMPLANT
BAG SPNG CNTER NS LX DISP (BAG)
CABLE HIGH FREQUENCY MONO STRZ (ELECTRODE) ×1 IMPLANT
COVER SURGICAL LIGHT HANDLE (MISCELLANEOUS) ×1 IMPLANT
DRAPE UTILITY XL STRL (DRAPES) ×1 IMPLANT
DRAPE WARM FLUID 44X44 (DRAPES) ×1 IMPLANT
DRSG OPSITE POSTOP 4X8 (GAUZE/BANDAGES/DRESSINGS) IMPLANT
DRSG TEGADERM 2-3/8X2-3/4 SM (GAUZE/BANDAGES/DRESSINGS) IMPLANT
DRSG TEGADERM 4X4.75 (GAUZE/BANDAGES/DRESSINGS) IMPLANT
ELECT REM PT RETURN 15FT ADLT (MISCELLANEOUS) ×1 IMPLANT
GAUZE SPONGE 2X2 STRL 8-PLY (GAUZE/BANDAGES/DRESSINGS) ×1 IMPLANT
GLOVE ECLIPSE 8.0 STRL XLNG CF (GLOVE) ×1 IMPLANT
GLOVE INDICATOR 8.0 STRL GRN (GLOVE) ×1 IMPLANT
GOWN STRL REUS W/ TWL XL LVL3 (GOWN DISPOSABLE) ×4 IMPLANT
GOWN STRL REUS W/TWL XL LVL3 (GOWN DISPOSABLE) ×4
HANDLE SUCTION POOLE (INSTRUMENTS) IMPLANT
IRRIG SUCT STRYKERFLOW 2 WTIP (MISCELLANEOUS) ×1
IRRIGATION SUCT STRKRFLW 2 WTP (MISCELLANEOUS) ×1 IMPLANT
KIT BASIN OR (CUSTOM PROCEDURE TRAY) ×1 IMPLANT
KIT TURNOVER KIT A (KITS) IMPLANT
PAD POSITIONING PINK XL (MISCELLANEOUS) ×1 IMPLANT
PROTECTOR NERVE ULNAR (MISCELLANEOUS) IMPLANT
RETRACTOR WND ALEXIS 25 LRG (MISCELLANEOUS) IMPLANT
RTRCTR WOUND ALEXIS 25CM LRG (MISCELLANEOUS)
SCISSORS LAP 5X35 DISP (ENDOMECHANICALS) ×1 IMPLANT
SEALER TISSUE G2 STRG ARTC 35C (ENDOMECHANICALS) IMPLANT
SET TUBE SMOKE EVAC HIGH FLOW (TUBING) ×1 IMPLANT
SLEEVE Z-THREAD 5X100MM (TROCAR) ×2 IMPLANT
SPIKE FLUID TRANSFER (MISCELLANEOUS) ×1 IMPLANT
STAPLER VISISTAT 35W (STAPLE) IMPLANT
SUCTION POOLE HANDLE (INSTRUMENTS) ×1
SUT MNCRL AB 4-0 PS2 18 (SUTURE) ×1 IMPLANT
SUT PDS AB 1 TP1 54 (SUTURE) IMPLANT
SUT SILK 2 0 (SUTURE) ×1
SUT SILK 2 0 SH CR/8 (SUTURE) ×1 IMPLANT
SUT SILK 2-0 18XBRD TIE 12 (SUTURE) ×1 IMPLANT
SUT SILK 3 0 (SUTURE) ×1
SUT SILK 3 0 SH CR/8 (SUTURE) ×1 IMPLANT
SUT SILK 3-0 18XBRD TIE 12 (SUTURE) ×1 IMPLANT
SYR BULB IRRIG 60ML STRL (SYRINGE) IMPLANT
TOWEL OR 17X26 10 PK STRL BLUE (TOWEL DISPOSABLE) ×1 IMPLANT
TOWEL OR NON WOVEN STRL DISP B (DISPOSABLE) ×1 IMPLANT
TRAY FOLEY MTR SLVR 16FR STAT (SET/KITS/TRAYS/PACK) IMPLANT
TRAY LAPAROSCOPIC (CUSTOM PROCEDURE TRAY) ×1 IMPLANT
TROCAR 11X100 Z THREAD (TROCAR) IMPLANT
TROCAR Z-THREAD OPTICAL 5X100M (TROCAR) ×1 IMPLANT

## 2023-02-08 NOTE — Op Note (Signed)
02/08/2023  8:48 AM  PATIENT:  Shelly Lane  47 y.o. female  PRE-OPERATIVE DIAGNOSIS:  abdominal pain  POST-OPERATIVE DIAGNOSIS:  abdominal pain, internal hernia  PROCEDURE:  Procedure(s): LAPAROSCOPY DIAGNOSTIC, EXPLORATORY LAPAROTOMY (N/A)  SURGEON:  Surgeon(s) and Role:    Ralene Ok, MD - Primary  ANESTHESIA:   local and general  EBL:  minimal   BLOOD ADMINISTERED:none  DRAINS: none   LOCAL MEDICATIONS USED:  XYLOCAINE   SPECIMEN:  No Specimen  DISPOSITION OF SPECIMEN:  N/A  COUNTS:  YES  TOURNIQUET:  * No tourniquets in log *  DICTATION: .Dragon Dictation Indication procedure: Patient is a 47 year old female with history of gastric sleeve.  Patient came in secondary to abdominal pain.  CT scan did reveal possible twist in her mesentery with concern for internal hernia.  Secondary to patient's pain, tachycardia and CT scan patient is taken back for diagnostic laparoscopy.  Findings: Patient did have a very redundant mesentery.  There was some lymphatic ascites.  There appeared to be a internal hernia with a twist in the mesentery around the ileocolic mesentery.  The cecum and appendix were in the right upper quadrant area.  The entire small bowel was detorsed.  The anastomosis was visualized and seem to be in place.  The small bowel contents were then placed back in the abdomen appropriately.  Details of procedure: After the patient was consented patient taken back to the OR and placed supine position with bilateral SCDs in place.  Patient underwent general endotracheal anesthesia.  Patient was then prepped and draped sterile fashion.  A timeout was called and all facts were verified.  At this time a Veress needle technique was used insufflate the abdomen in the right subcostal margin.  Subsequent to this 5 Miller trocar and camera placed in the right lower quadrant and at this time the camera was introduced.  There was no injury to any intra-abdominal  organs.  A 5 Miller trocars placed in the midline infraumbilical area in the left lower quadrant under direct visualization. This time the patient was position.  I proceeded to run the bowel from proximal distal direction.  There appeared to be a twist in the mesentery of the small bowel.  There was also some lymphatic ascites.  Unfortunately secondary to distention of the small bowel and the torsion I cannot fully reduce the small bowel.  At this time I proceeded with a laparotomy.  An upper midline incision was made a with a #10 blade.  Cautery was used to maintain hemostasis and dissection was taken down to the linea alba.  I did encounter an old seroma cavity.  At this time the linea alba was divided.  I was able to the into the abdomen.  This was extended the length of skin incision.  At this time I was able to eviscerate the small bowel.  Upon doing so I was easily able to visualize that there was the cecum and appendix in the right upper quadrant area.  The mesentery was also very redundant.  It appeared that the small bowel had volvulized around the ileocolic mesentery.  This was detorsed.  I was able to proceeded to run the bowel from the ligament of Treitz distally.  I encountered the gastrojejunostomy anastomosis.  This was intact without any issues.  I proceeded to run the bowel distally.  This came down to the right cecum.  The bowel was in the correct orientation.  At this time this was placed back  into the abdominal cavity.  At this time the midline fascia was then reapproximated using #1 PDS single-stranded in a running fashion x 2.  The skin was then closed at all trocar sites in the midline.  Patient tolerated the procedure well was taken to the recovery in stable condition.      PLAN OF CARE: Admit to inpatient   PATIENT DISPOSITION:  PACU - hemodynamically stable.   Delay start of Pharmacological VTE agent (>24hrs) due to surgical blood loss or risk of bleeding: no

## 2023-02-08 NOTE — Anesthesia Postprocedure Evaluation (Signed)
Anesthesia Post Note  Patient: Shelly Lane  Procedure(s) Performed: LAPAROSCOPY DIAGNOSTIC, EXPLORATORY LAPAROTOMY     Patient location during evaluation: PACU Anesthesia Type: General Level of consciousness: awake and alert, oriented and patient cooperative Pain management: pain level controlled Vital Signs Assessment: post-procedure vital signs reviewed and stable Respiratory status: spontaneous breathing, nonlabored ventilation and respiratory function stable Cardiovascular status: blood pressure returned to baseline and stable Postop Assessment: no apparent nausea or vomiting Anesthetic complications: no   No notable events documented.  Last Vitals:  Vitals:   02/08/23 0845 02/08/23 0900  BP: 125/88 123/84  Pulse: (!) 104 97  Resp: 20 20  Temp:    SpO2: 100% 100%    Last Pain:  Vitals:   02/08/23 0900  TempSrc:   PainSc: [name]

## 2023-02-08 NOTE — Progress Notes (Signed)
Pt in PACU as preop holding Discussed w patient & updated Dr Rosendo Gros Dr Rosendo Gros to assume surgical care - pt comfortable w transfer of care

## 2023-02-08 NOTE — ED Notes (Signed)
After ketamine admin pt breathing but not alert, Shelly Gallo PA in to assess pt. VS stable. Pt placed on a non-rebreather.

## 2023-02-08 NOTE — Transfer of Care (Signed)
Immediate Anesthesia Transfer of Care Note  Patient: Rheagan Gavigan  Procedure(s) Performed: Procedure(s): LAPAROSCOPY DIAGNOSTIC, EXPLORATORY LAPAROTOMY (N/A)  Patient Location: PACU  Anesthesia Type:General  Level of Consciousness: Patient easily awoken, sedated, comfortable, cooperative, following commands, responds to stimulation.   Airway & Oxygen Therapy: Patient spontaneously breathing, ventilating well, oxygen via simple oxygen mask.  Post-op Assessment: Report given to PACU RN, vital signs reviewed and stable, moving all extremities.   Post vital signs: Reviewed and stable.  Complications: No apparent anesthesia complications  Last Vitals:  Vitals Value Taken Time  BP 125/88 02/08/23 0845  Temp 36.3 C 02/08/23 0837  Pulse 108 02/08/23 0855  Resp 17 02/08/23 0855  SpO2 100 % 02/08/23 0855  Vitals shown include unvalidated device data.  Last Pain:  Vitals:   02/08/23 0845  TempSrc:   PainSc: Asleep         Complications: No notable events documented.

## 2023-02-08 NOTE — ED Notes (Signed)
CHG bath done for pt prior to taking pt to PACU. Clothing and shoes placed in pt belongings bags and taken with pt to PACU

## 2023-02-08 NOTE — ED Notes (Signed)
Pt transported to PACU in stable condition. Personal belongings secured with security 1 braclet, 5 rings, 2 necklace, 2 earrings, 4 clothes pins and a tablet. Paperwork sent to PACU with pt.

## 2023-02-08 NOTE — H&P (Signed)
Shelly Lane  [birth year] ON:[id number]  CARE TEAM:  PCP: Nolene Ebbs, MD  Outpatient Care Team: Patient Care Team: Nolene Ebbs, MD as PCP - General (Internal Medicine) Sabino Snipes., MD (Surgery) Irene Limbo, MD as Consulting Physician (Plastic Surgery) Jaquita Folds, MD as Consulting Physician (Obstetrics and Gynecology) Eusebio Friendly, MD as Referring Physician (Gastroenterology)  Inpatient Treatment Team: Treatment Team: Attending Provider: Quintella Reichert, MD; Registered Nurse: Malissa Hippo, RN; Technician: Alen Bleacher, NT; Technician: Sharma Covert, EMT; Consulting Physician: Edison Pace, Md, MD   This patient is a 47 y.o.female who presents today for surgical evaluation at the request of Dr Ralene Bathe.   Chief complaint / Reason for evaluation: Severe abdominal pain and tachycardia with history of prior bariatric surgery.  Possible internal hernia.  47 year old female.  History of morbid obesity.  Prior cholecystectomy.  She had a hiatal hernia with heartburn reflux refractory to medical therapy.  Underwent laparoscopic Nissen fundoplication by myself in 2012.  She had persistent weight issues.  Got involved with the [facility] bariatric program.  Underwent surgery by Dr. Norris Cross in 2022.  Looks like he was planning to do a Roux-en-Y gastric bypass below the fundoplication.  He ended up doing a sleeve gastrectomy and then bring a loop of jejunum for a side sleeve to side jejunal gastrojejunostomy.  He did not do a separate jejunostomy Roux-en-Y reconstruction since his op report claims that the small bowel was too narrow.  She has had some weight loss with the surgery.  Got to the point of able to get a panniculectomy for recurrent panniculitis last October 2023.  She does struggle with some intermittent abdominal pain and some chronic abdominal pain.  She does have chronic constipation.  She was found to have an ulcer at the loop gastrojejunostomy and was  switched to [place].  Followed by Dr. Cindee Salt in [facility] with gastroenterology.  Perforation.  Patient notes felt severe crampy abdominal pain and bloating.  There were she ever felt.  She was scared.  She thought she was going to die.  Came to the [facility] emergency department.  Was moderate and then became unbearable.  Became very bloated/distended.  Improved with some pain medication.  CT scan notes some concerning swelling mesentery in the right upper quadrant of the small bowel.  No definite bowel obstruction but some type of internal hernia or volvulus cannot be ruled out.  NG tube placed.  She is feeling less bloated and better overall but still not comfortable.  She remains tachycardic.   Assessment  Shelly Lane  47 y.o. female       Problem List:  Principal Problem:   Acute abdominal pain Active Problems:   History of lap gastric sleeve with loop gastrojejunostomy   History of Nissen fundoplication   Irritable bowel syndrome (IBS)   Chronic abdominal pain   Gastrojejunal ulcer   Episode of severe abdominal pain with cramping and tachycardia in a patient with prior bariatric surgery with persistent tachycardia.  Concern for swelling of mesentery with possible internal hernia or closed-loop/volvulus  Plan:  NG tube decompression.  She feels better.  Diagnostic laparoscopy to rule out internal hernia with incarcerated bowel or swirling/ischemia.  While she feels better with the NG tube into a rather decompressed gastric sleeve, her persistent tachycardia and abnormal CAT scan raise concerns that her bowel could be trapped.  She had a loop gastrojejunostomy so in theory there could be bowel that could get underneath that  and cause an internal hernia type situation.  Given her persistent tachycardia and worst pain ever without any evidence of any other problems such as perforated ulcer, appendicitis, diverticulitis, definite bowel obstruction, etc.; recommended we be aggressive  and do diagnostic laparoscopy possible laparotomy possible bowel resection.    The anatomy & physiology of the digestive tract was discussed.  The pathophysiology of perforation was discussed.  Differential diagnosis such as perforated ulcer or colon, etc was discussed.   Natural history risks without surgery such as death was discussed.  I recommended abdominal exploration to diagnose & treat the source of the problem.  Laparoscopic & open techniques were discussed.   Risks such as bleeding, infection, abscess, leak, reoperation, bowel resection, possible ostomy, injury to other organs, need for repair of tissues / organs, hernia, heart attack, death, and other risks were discussed.   The risks of no intervention will lead to serious problems including death.   I expressed a good likelihood that surgery will address the problem.    Goals of post-operative recovery were discussed as well.  We will work to minimize complications although risks in an emergent setting are high.   Questions were answered.  The patient expressed understanding & wishes to proceed with surgery.        -VTE prophylaxis- SCDs, etc -mobilize as tolerated to help recovery  I reviewed nursing notes, ED provider notes, last 24 h vitals and pain scores, last 48 h intake and output, last 24 h labs and trends, last 24 h imaging results, and prior surgeries done at [facility] along with gastroenterology endoscopy done at [facility]. . I have reviewed this patient's available data, including medical history, events of note, test results, etc as part of my evaluation.  A significant portion of that time was spent in counseling.  Care during the described time interval was provided by me.  This care required high  level of medical decision making.  02/08/2023  Adin Hector, MD, FACS, MASCRS Esophageal, Gastrointestinal & Colorectal Surgery Robotic and Minimally Invasive Surgery  [facility] [id number], [place], [place] [address] [phone] Fax [phone] Main  CONTACT INFORMATION:  Weekday (9AM-5PM): Call [facility] main office at [phone]  Weeknight (5PM-9AM) or Weekend/Holiday: Check [email or web address] (password " [email or web address]") for General Surgery [facility] coverage  (Please, do not use SecureChat as it is not reliable communication to reach operating surgeons for immediate patient care given surgeries/outpatient duties/clinic/cross-coverage/off post-call which would lead to a delay in care.  Epic staff messaging available for outptient concerns, but may not be answered for 48 hours or more).     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   S/P panniculectomy    SUI (stress urinary incontinence, female)  Vaginal wall prolapse     Past Surgical History:  Procedure Laterality Date   ANTERIOR AND POSTERIOR REPAIR WITH SACROSPINOUS FIXATION N/A 12/25/2021   Procedure: ANTERIOR AND POSTERIOR REPAIR WITH SACROSPINOUS FIXATION;  Surgeon: Jaquita Folds, MD;  Location: [facility];  Service: Gynecology;  Laterality: N/A;   CHOLECYSTECTOMY, LAPAROSCOPIC  08/16/2007   '@[facility]'$    CYSTOSCOPY N/A 12/25/2021   Procedure: CYSTOSCOPY;  Surgeon: Jaquita Folds, MD;  [facility]: [facility];  Service: Gynecology;  Laterality: N/A;   GASTROJEJUNOSTOMY  07/14/2021   Loop GJ  with gastric sleeve - [facility] - Dr Riki Rusk   LAPAROSCOPIC GASTRIC SLEEVE RESECTION  07/14/2021   Loop GJ with gastric sleeve - [facility] - Dr Riki Rusk   LAPAROSCOPIC NISSEN FUNDOPLICATION  [id number]   '@[facility]'$   ;   paraesophageal and hiatal hernia repair   PANNICULECTOMY N/A 10/13/2022   Procedure: PANNICULECTOMY;  Surgeon: Irene Limbo, MD;  Location: [facility];  Service: Plastics;  Laterality: N/A;   WISDOM TOOTH EXTRACTION      Social History   Socioeconomic History   Marital status: Married    Spouse name: Aliou Bia   Number of children: 9   Years of education: 12   Highest education level: Not on file  Occupational History   Occupation: SAHM  Tobacco Use   Smoking status: Never   Smokeless tobacco: Never  Vaping Use   Vaping Use: Never used  Substance and Sexual Activity   Alcohol use: Never   Drug use: Never   Sexual activity: Yes    Partners: Male  Other Topics Concern   Not on file  Social History Narrative   Not on file   Social Determinants of Health   Financial Resource Strain: Low Risk  (07/18/2018)   Overall Financial Resource Strain (CARDIA)    Difficulty of Paying Living Expenses: Not hard at all  Food Insecurity: No Food Insecurity (07/18/2018)   Hunger Vital Sign    Worried About Running Out of Food in the Last Year: Never true    Lawn in the Last Year: Never true  Transportation Needs: No Transportation Needs (07/18/2018)   PRAPARE - Hydrologist (Medical): No    Lack of Transportation (Non-Medical): No  Physical Activity: Inactive (07/18/2018)   Exercise Vital Sign    Days of Exercise per Week: 0 days    Minutes of Exercise per Session: 0 min  Stress: Stress Concern Present (07/18/2018)   [place]    Feeling of Stress : To some extent  Social Connections: Socially Integrated (07/18/2018)   Social  Connection and Isolation Panel [NHANES]    Frequency of Communication with Friends and Family: More than three times a week    Frequency of Social Gatherings with Friends and Family: Twice a week    Attends Religious Services: More than 4 times per year    Active Member of [employer] or Organizations: Yes    Attends Archivist Meetings: 1 to 4 times per year    Marital Status: Married  Human resources officer Violence: Not At Risk (07/18/2018)   Humiliation, Afraid, Rape, and Kick questionnaire    Fear of Current or Ex-Partner: No    Emotionally Abused: No    Physically Abused: No    Sexually Abused: No    Family History  Problem Relation Age of Onset   Diabetes Maternal  Grandmother    Hypertension Maternal Grandmother    Anemia Mother     Current Facility-Administered Medications  Medication Dose Route Frequency Provider Last Rate Last Admin   cefoTEtan (CEFOTAN) 2 g in sodium chloride 0.9 % 100 mL IVPB  2 g Intravenous On Call to OR Michael Boston, MD       Chlorhexidine Gluconate Cloth 2 % PADS 6 each  6 each Topical Once Michael Boston, MD       lactated ringers bolus 1,000 mL  1,000 mL Intravenous Once Michael Boston, MD       lactated ringers bolus 1,000 mL  1,000 mL Intravenous Q8H PRN Amandajo Gonder, Remo Lipps, MD       pantoprazole (PROTONIX) injection 40 mg  40 mg Intravenous Gorden Harms, MD       sodium chloride 0.9 % bolus 1,000 mL  1,000 mL Intravenous Once Quintella Reichert, MD       Current Outpatient Medications  Medication Sig Dispense Refill   acetaminophen (TYLENOL) 500 MG tablet Take 1 tablet (500 mg total) by mouth every 6 (six) hours as needed (pain). 30 tablet 0   cyclobenzaprine (FLEXERIL) 10 MG tablet Take 10 mg by mouth 3 (three) times daily as needed for muscle spasms.     dexlansoprazole (DEXILANT) 60 MG capsule Take 60 mg by mouth daily.     doxepin (SINEQUAN) 10 MG capsule Take 10 mg by mouth at bedtime.     fluticasone (FLONASE) 50 MCG/ACT nasal spray Place 2  sprays into both nostrils daily as needed for allergies or rhinitis.     lidocaine (XYLOCAINE) 2 % solution Use as directed 15 mLs in the mouth or throat every 6 (six) hours as needed for mouth pain.     loratadine (CLARITIN) 10 MG tablet Take 10 mg by mouth daily as needed for allergies.     Multiple Vitamin (MULTIVITAMIN) tablet Take 1 tablet by mouth daily.     ondansetron (ZOFRAN-ODT) 8 MG disintegrating tablet Take 8 mg by mouth every 8 (eight) hours as needed for nausea or vomiting.     OVER THE COUNTER MEDICATION Apply 1 patch topically daily. Multivitamin Patch Every 24 Hours     Oxycodone HCl 20 MG TABS Take 1 tablet by mouth every 4 (four) hours.     sucralfate (CARAFATE) 1 g tablet Take 1 g by mouth 4 (four) times daily -  with meals and at bedtime.     Vitamin D, Ergocalciferol, (DRISDOL) 1.25 MG (50000 UNIT) CAPS capsule Take 50,000 Units by mouth every 7 (seven) days.     zolpidem (AMBIEN) 10 MG tablet Take 10 mg by mouth at bedtime.     conjugated estrogens (PREMARIN) vaginal cream Place 0.5g nightly for two weeks then twice a week after (Patient not taking: Reported on 02/08/2023) 30 g 11   fluconazole (DIFLUCAN) 150 MG tablet Take 1 tablet (150 mg total) by mouth once as needed for up to 1 dose (yeast infection). (Patient not taking: Reported on 02/08/2023) 1 tablet 1   ibuprofen (ADVIL) 600 MG tablet Take 1 tablet (600 mg total) by mouth every 6 (six) hours as needed. (Patient not taking: Reported on 02/08/2023) 30 tablet 0     Allergies  Allergen Reactions   Hydrocodone Itching   Hydroxyzine Hcl Other (See Comments)    "Made me feel like I was going nuts".    Pineapple Itching and Swelling    Per pt itching throat and tongue swelling   Reglan [Metoclopramide]     "  Made me very weak and feel jumpy"   Subutex [Buprenorphine] Itching    ROS:   All other systems reviewed & are negative except per HPI or as noted below: Constitutional:  No fevers, chills, sweats.  Weight  stable Eyes:  No vision changes, No discharge HENT:  No sore throats, nasal drainage Lymph: No neck swelling, No bruising easily Pulmonary:  No cough, productive sputum CV: No orthopnea, PND  Patient walks 20 minutes without difficulty.  No exertional chest/neck/shoulder/arm pain.  GI: No personal nor family history of GI/colon cancer, inflammatory bowel disease, irritable bowel syndrome, allergy such as Celiac Sprue, dietary/dairy problems, colitis, ulcers nor gastritis.  No recent sick contacts/gastroenteritis.  No travel outside the country.  No changes in diet.  Renal: No UTIs, No hematuria Genital:  No drainage, bleeding, masses Musculoskeletal: No severe joint pain.  Good ROM major joints Skin:  No sores or lesions Heme/Lymph:  No easy bleeding.  No swollen lymph nodes   BP 126/87   Pulse (!) 110   Temp 97.6 F (36.4 C) (Oral)   Resp (!) 23   SpO2 99%   Physical Exam:  Constitutional: Not cachectic.  Hygeine adequate.  Vitals signs as above.   Eyes: Pupils reactive, normal extraocular movements. Sclera nonicteric Neuro: CN II-XII intact.  No major focal sensory defects.  No major motor deficits. Lymph: No head/neck/groin lymphadenopathy Psych:  No severe agitation.  No severe anxiety.  Judgment & insight Adequate, Oriented x4, HENT: Normocephalic, Mucus membranes moist.  No thrush.   Neck: Supple, No tracheal deviation.  No obvious thyromegaly Chest: No pain to chest wall compression.  Good respiratory excursion.  No audible wheezing CV:  Pulses intact.  regular rhythm.  No major extremity edema  Abdomen:  Obese Hernia: Not present. Diastasis recti: Not present. Somewhat firm.   Moderately distended.  Tenderness in abdomen epigastric region.  No frank guarding..  No hepatomegaly.  No splenomegaly  Gen:  Inguinal hernia: Not present.  Inguinal lymph nodes: without lymphadenopathy.    Rectal: (Deferred)  Ext: No obvious deformity or contracture.  Edema: Not present.  No  cyanosis Skin: No major subcutaneous nodules.  Warm and dry Musculoskeletal: Severe joint rigidity not present.  No obvious clubbing.  No digital petechiae.     Results:   Labs: Results for orders placed or performed during the hospital encounter of 02/08/23 (from the past 48 hour(s))  Lipase, blood     Status: None   Collection Time: 02/08/23 12:49 AM  Result Value Ref Range   Lipase 26 11 - 51 U/L    Comment: Performed at [facility], [place] [address], [place], [place] [address]  Comprehensive metabolic panel     Status: Abnormal   Collection Time: 02/08/23 12:49 AM  Result Value Ref Range   Sodium 134 (L) 135 - 145 mmol/L   Potassium 3.1 (L) 3.5 - 5.1 mmol/L   Chloride 101 98 - 111 mmol/L   CO2 25 22 - 32 mmol/L   Glucose, Bld 87 70 - 99 mg/dL    Comment: Glucose reference range applies only to samples taken after fasting for at least 8 hours.   BUN 14 6 - 20 mg/dL   Creatinine, Ser 0.53 0.44 - 1.00 mg/dL   Calcium 8.2 (L) 8.9 - 10.3 mg/dL   Total Protein 6.4 (L) 6.5 - 8.1 g/dL   Albumin 2.6 (L) 3.5 - 5.0 g/dL   AST 39 15 - 41 U/L   ALT 44  0 - 44 U/L   Alkaline Phosphatase 126 38 - 126 U/L   Total Bilirubin 0.7 0.3 - 1.2 mg/dL   GFR, Estimated >60 >60 mL/min    Comment: (NOTE) Calculated using the CKD-EPI Creatinine Equation (2021)    Anion gap 8 5 - 15    Comment: Performed at [facility], [place] [address], [place], [place] [address]  CBC     Status: Abnormal   Collection Time: 02/08/23 12:49 AM  Result Value Ref Range   WBC 9.3 4.0 - 10.5 K/uL   RBC 4.34 3.87 - 5.11 MIL/uL   Hemoglobin 12.0 12.0 - 15.0 g/dL   HCT 38.4 36.0 - 46.0 %   MCV 88.5 80.0 - 100.0 fL   MCH 27.6 26.0 - 34.0 pg   MCHC 31.3 30.0 - 36.0 g/dL   RDW 17.1 (H) 11.5 - 15.5 %   Platelets 358 150 - 400 K/uL   nRBC 0.0 0.0 - 0.2 %    Comment: Performed at [facility], [place] [address] [place], [place] [address]  Urinalysis, Routine w reflex  microscopic -Urine, Clean Catch     Status: Abnormal   Collection Time: 02/08/23 12:49 AM  Result Value Ref Range   Color, Urine YELLOW YELLOW   APPearance CLEAR CLEAR   Specific Gravity, Urine 1.019 1.005 - 1.030   pH 5.0 5.0 - 8.0   Glucose, UA NEGATIVE NEGATIVE mg/dL   Hgb urine dipstick NEGATIVE NEGATIVE   Bilirubin Urine NEGATIVE NEGATIVE   Ketones, ur NEGATIVE NEGATIVE mg/dL   Protein, ur NEGATIVE NEGATIVE mg/dL   Nitrite NEGATIVE NEGATIVE   Leukocytes,Ua TRACE (A) NEGATIVE   RBC / HPF 0-5 0 - 5 RBC/hpf   WBC, UA 0-5 0 - 5 WBC/hpf   Bacteria, UA NONE SEEN NONE SEEN   Squamous Epithelial / HPF 0-5 0 - 5 /HPF   Mucus PRESENT    Hyaline Casts, UA PRESENT     Comment: Performed at [facility], [place] [address], [place], [place] [address]  I-Stat beta hCG blood, ED     Status: None   Collection Time: 02/08/23  1:01 AM  Result Value Ref Range   I-stat hCG, quantitative <5.0 <5 mIU/mL   Comment 3            Comment:   GEST. AGE      CONC.  (mIU/mL)   <=1 WEEK        5 - 50     2 WEEKS       50 - 500     3 WEEKS       100 - 10,000     4 WEEKS     1,000 - 30,000        FEMALE AND NON-PREGNANT FEMALE:     LESS THAN 5 mIU/mL     Imaging / Studies: DG Abdomen 1 View  Result Date: 02/08/2023 CLINICAL DATA:  47 year old female status post nasogastric tube placement. EXAM: ABDOMEN - 1 VIEW COMPARISON:  No priors. FINDINGS: There is a nasogastric tube which appears coiled in the proximal stomach, with side port approximately 7 cm distal to the gastroesophageal junction, and tip directed retrograde at the level of the gastroesophageal junction. Abdomen is incompletely imaged, but there are multiple dilated loops of small bowel compatible with persistent small bowel obstruction. No definite pneumoperitoneum noted. Surgical clips project over the right upper quadrant of the abdomen. Iodinated contrast material in the collecting system of  both kidneys secondary to recent  contrast enhanced CT examination. IMPRESSION: 1. Support apparatus, as above. 2. Persistent dilatation of the small bowel indicative of persistent small bowel obstruction. Electronically Signed   By: Vinnie Langton M.D.   On: 02/08/2023 05:16   CT Abdomen Pelvis W Contrast  Result Date: 02/08/2023 CLINICAL DATA:  Abdominal pain, acute, nonlocalized. History of gastric ulcer and gastric sleeve. Severe abdominal pain and bloating. EXAM: CT ABDOMEN AND PELVIS WITH CONTRAST TECHNIQUE: Multidetector CT imaging of the abdomen and pelvis was performed using the standard protocol following bolus administration of intravenous contrast. RADIATION DOSE REDUCTION: This exam was performed according to the departmental dose-optimization program which includes automated exposure control, adjustment of the mA and/or kV according to patient size and/or use of iterative reconstruction technique. CONTRAST:  148m OMNIPAQUE IOHEXOL 300 MG/ML  SOLN COMPARISON:  None Available. FINDINGS: Lower chest: No acute abnormality. Hepatobiliary: No focal liver abnormality is seen. Status post cholecystectomy. No biliary dilatation. Pancreas: Unremarkable. No pancreatic ductal dilatation or surrounding inflammatory changes. Spleen: Normal in size without focal abnormality. Adrenals/Urinary Tract: The adrenal glands are within normal limits. The kidneys enhance symmetrically. No renal calculus or hydronephrosis. Bladder is unremarkable. Stomach/Bowel: Gastric surgical changes are noted. No free air or pneumatosis. There are distended loops of small bowel in the abdomen measuring up to 3.9 cm. The distal small bowel in the pelvis is decompressed. Mesenteric swirling is noted in the mid right abdomen, best seen on axial images 40-43. The appendix is not visualized on exam. Vascular/Lymphatic: The aorta is and IVC are within normal limits. 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 The portal  vein and splenic vein are patent. Enlarged lymph nodes are present at the mesenteric root, which may be reactive. Reproductive: Uterus and bilateral adnexa are unremarkable. Other: A trace amount of ascites is noted in the pelvis. Mesenteric edema is noted in the mid to lower abdomen. Musculoskeletal: Mild degenerative changes in the thoracolumbar spine. No acute osseous abnormality. IMPRESSION: 1. Multiple distended loops of small bowel in the abdomen measuring up to 3.8 cm. No definite transition point is delineated, however there is swirling of the mesentery in the mid right abdomen, concerning for internal hernia. Emergent surgical consultation is recommended. 2. Superior mesenteric vein is not seen at the level of mesenteric swirling in the mid right abdomen and mesenteric edema is noted in the mid to lower mesentery, possible superimposed veno-occlusive mesenteric ischemia versus venous congestion. 3. Mesenteric lymphadenopathy, likely reactive. Critical Value/emergent results were called by telephone at the time of interpretation on 02/08/2023 at 4:39 am [facility], who verbally acknowledged these results. Electronically Signed   By: LBrett FairyM.D.   On: 02/08/2023 04:40   DG Chest Port 1 View  Result Date: 02/08/2023 CLINICAL DATA:  Evaluate for free air EXAM: PORTABLE CHEST 1 VIEW COMPARISON:  05/31/2018 FINDINGS: Cardiac shadow is within normal limits. Lungs are well aerated bilaterally. Scattered large and small bowel gas is noted. No free intraperitoneal air is seen. No bony abnormality is noted. IMPRESSION: No active disease. Electronically Signed   By: MInez CatalinaM.D.   On: 02/08/2023 03:44    Medications / Allergies: per chart  Antibiotics: Anti-infectives (From admission, onward)    Start     Dose/Rate Route Frequency Ordered Stop   02/08/23 0600  cefoTEtan (CEFOTAN) 2 g in sodium chloride 0.9 % 100 mL IVPB  2 g 200 mL/hr over 30 Minutes Intravenous On call to  O.R. 02/08/23 0557 02/09/23 0559         Note: Portions of this report may have been transcribed using voice recognition software. Every effort was made to ensure accuracy; however, inadvertent computerized transcription errors may be present.   Any transcriptional errors that result from this process are unintentional.    Adin Hector, MD, FACS, MASCRS Esophageal, Gastrointestinal & Colorectal Surgery Robotic and Minimally Invasive Surgery  [facility]. [address], [place], [place] [address] [phone] Fax [phone] Main  CONTACT INFORMATION:  Weekday (9AM-5PM): Call [facility] main office at [phone]  Weeknight (5PM-9AM) or Weekend/Holiday: Check [email or web address] (password " [email or web address]") for General Surgery [facility] coverage  (Please, do not use SecureChat as it is not reliable communication to reach operating surgeons for immediate patient care given surgeries/outpatient duties/clinic/cross-coverage/off post-call which would lead to a delay in care.  Epic staff messaging available for outptient concerns, but may not be answered for 48 hours or more).      02/08/2023  5:59 AM

## 2023-02-08 NOTE — Anesthesia Procedure Notes (Signed)
Procedure Name: Intubation Date/Time: 02/08/2023 7:30 AM  Performed by: Deliah Boston, CRNAPre-anesthesia Checklist: Patient identified, Emergency Drugs available, Suction available and Patient being monitored Patient Re-evaluated:Patient Re-evaluated prior to induction Oxygen Delivery Method: Circle system utilized Preoxygenation: Pre-oxygenation with 100% oxygen Induction Type: IV induction and Rapid sequence Laryngoscope Size: Mac and 3 Grade View: Grade I Tube type: Oral Tube size: 7.0 mm Number of attempts: 1 Airway Equipment and Method: Stylet Placement Confirmation: ETT inserted through vocal cords under direct vision, positive ETCO2 and breath sounds checked- equal and bilateral Secured at: 21 cm Tube secured with: Tape Dental Injury: Teeth and Oropharynx as per pre-operative assessment  Comments: Pt pre oxygenated with 100%FiO2. NGT placed to suction and allowed to completely drain. Smooth IV rapid sequence induction. DVL x 1 with MAC 3, grade 1 view with slight cricoid pressure, would suggest using MAC 4 blade in future. Atraumatic oral intubation, Bilat breath sounds + & =, tube taped to side of mouth at 21cm.

## 2023-02-08 NOTE — Anesthesia Preprocedure Evaluation (Addendum)
Anesthesia Evaluation  Patient identified by MRN, date of birth, ID band Patient awake    Reviewed: Allergy & Precautions, H&P , NPO status , Patient's Chart, lab work & pertinent test results  History of Anesthesia Complications (+) PONV and history of anesthetic complications  Airway Mallampati: III  TM Distance: >3 FB Neck ROM: Full    Dental  (+) Teeth Intact, Dental Advisory Given   Pulmonary neg pulmonary ROS   Pulmonary exam normal breath sounds clear to auscultation       Cardiovascular negative cardio ROS Normal cardiovascular exam Rhythm:Regular Rate:Normal     Neuro/Psych  Headaches PSYCHIATRIC DISORDERS Anxiety Depression       GI/Hepatic Neg liver ROS, PUD,GERD  Controlled,,S/p gastric sleeve and multiple abdominal surgeries- now concern for bowel obstruction   Endo/Other  negative endocrine ROS    Renal/GU negative Renal ROS  negative genitourinary   Musculoskeletal negative musculoskeletal ROS (+)    Abdominal   Peds negative pediatric ROS (+)  Hematology Hb 12, hct 38.4   Anesthesia Other Findings   Reproductive/Obstetrics negative OB ROS                             Anesthesia Physical Anesthesia Plan  ASA: 3  Anesthesia Plan: General   Post-op Pain Management: Tylenol PO (pre-op)*, Toradol IV (intra-op)* and Precedex   Induction: Intravenous  PONV Risk Score and Plan: 4 or greater and Ondansetron, Dexamethasone, Midazolam, Treatment may vary due to age or medical condition, Scopolamine patch - Pre-op and Metaclopromide  Airway Management Planned: Oral ETT  Additional Equipment: None  Intra-op Plan:   Post-operative Plan: Extubation in OR  Informed Consent: I have reviewed the patients History and Physical, chart, labs and discussed the procedure including the risks, benefits and alternatives for the proposed anesthesia with the patient or authorized  representative who has indicated his/her understanding and acceptance.     Dental advisory given  Plan Discussed with: CRNA  Anesthesia Plan Comments: (Will need type and screen, 2nd PIV in OR  Issues w/ emergence delirium in the past- will load w/ precedex prior to wakeup )       Anesthesia Quick Evaluation

## 2023-02-08 NOTE — ED Provider Notes (Signed)
[facility] EMERGENCY DEPARTMENT AT [facility] Provider Note   CSN: [id number] Arrival date & time: 02/08/23  0033     History  Chief Complaint  Patient presents with   Abdominal Pain    Pt bib ems with c/o upper abd pain and gas. Pt has a history of gastric ulcers and states it feels the same, tried oral lidocaine at home with no relief ongoing for approx 45 mins    Shelly Lane is a 47 y.o. female.  The history is provided by the patient.  Abdominal Pain Shelly Lane is a 47 y.o. female who presents to the Emergency Department complaining of abdominal pain.  She presents to the emergency apartment by EMS for evaluation abdominal pain that started 30 minutes prior to her calling out.  Pain is located across the upper abdomen and radiates to her back.  She feels hot and bloated.  No associated fever, vomiting, diarrhea.  Last bowel movement was 2 days ago.  She has a history of gastric ulcer and is followed by [facility].  She also has a history of prior gastric bypass and cholecystectomy.  No associated chest pain, difficulty breathing.  Symptoms are moderate to severe and constant in nature.     Home Medications Prior to Admission medications   Medication Sig Start Date End Date Taking? Authorizing Provider  acetaminophen (TYLENOL) 500 MG tablet Take 1 tablet (500 mg total) by mouth every 6 (six) hours as needed (pain). 12/18/21  Yes Jaquita Folds, MD  cyclobenzaprine (FLEXERIL) 10 MG tablet Take 10 mg by mouth 3 (three) times daily as needed for muscle spasms. 12/21/20  Yes [provider]  dexlansoprazole (DEXILANT) 60 MG capsule Take 60 mg by mouth daily.   Yes [provider]  doxepin (SINEQUAN) 10 MG capsule Take 10 mg by mouth at bedtime.   Yes [provider]  fluticasone (FLONASE) 50 MCG/ACT nasal spray Place 2 sprays into both nostrils daily as needed for allergies or rhinitis. 09/22/20  Yes [provider]   lidocaine (XYLOCAINE) 2 % solution Use as directed 15 mLs in the mouth or throat every 6 (six) hours as needed for mouth pain. 01/03/23  Yes [provider]  loratadine (CLARITIN) 10 MG tablet Take 10 mg by mouth daily as needed for allergies. 09/22/20  Yes [provider]  Multiple Vitamin (MULTIVITAMIN) tablet Take 1 tablet by mouth daily.   Yes [provider]  ondansetron (ZOFRAN-ODT) 8 MG disintegrating tablet Take 8 mg by mouth every 8 (eight) hours as needed for nausea or vomiting. 11/25/22  Yes [provider]  OVER THE COUNTER MEDICATION Apply 1 patch topically daily. Multivitamin Patch Every 24 Hours   Yes [provider]  Oxycodone HCl 20 MG TABS Take 1 tablet by mouth every 4 (four) hours.   Yes [provider]  sucralfate (CARAFATE) 1 g tablet Take 1 g by mouth 4 (four) times daily -  with meals and at bedtime. 10/27/10  Yes [provider]  Vitamin D, Ergocalciferol, (DRISDOL) 1.25 MG (50000 UNIT) CAPS capsule Take 50,000 Units by mouth every 7 (seven) days. 07/21/22  Yes [provider]  zolpidem (AMBIEN) 10 MG tablet Take 10 mg by mouth at bedtime.   Yes [provider]  conjugated estrogens (PREMARIN) vaginal cream Place 0.5g nightly for two weeks then twice a week after Patient not taking: Reported on 02/08/2023 07/06/22   Jaquita Folds, MD  fluconazole (DIFLUCAN) 150 MG tablet  Take 1 tablet (150 mg total) by mouth once as needed for up to 1 dose (yeast infection). Patient not taking: Reported on 02/08/2023 05/06/22   Jaquita Folds, MD  ibuprofen (ADVIL) 600 MG tablet Take 1 tablet (600 mg total) by mouth every 6 (six) hours as needed. Patient not taking: Reported on 02/08/2023 12/18/21   Jaquita Folds, MD      Allergies    Hydrocodone, Hydroxyzine hcl, Pineapple, Reglan [metoclopramide], and Subutex [buprenorphine]    Review of Systems   Review of Systems  Gastrointestinal:   Positive for abdominal pain.  All other systems reviewed and are negative.   Physical Exam Updated Vital Signs BP 118/89 (BP Location: Right Arm)   Pulse (!) 115   Temp 98.7 F (37.1 C)   Resp 17   SpO2 99%  Physical Exam Vitals and nursing note reviewed.  Constitutional:      Appearance: She is well-developed.  HENT:     Head: Normocephalic and atraumatic.  Cardiovascular:     Rate and Rhythm: Normal rate and regular rhythm.  Pulmonary:     Effort: Pulmonary effort is normal. No respiratory distress.  Abdominal:     Palpations: Abdomen is soft.     Tenderness: There is no guarding or rebound.     Comments: Mild epigastric tenderness  Musculoskeletal:        General: No tenderness.  Skin:    General: Skin is warm and dry.  Neurological:     Mental Status: She is alert and oriented to person, place, and time.  Psychiatric:        Behavior: Behavior normal.     ED Results / Procedures / Treatments   Labs (all labs ordered are listed, but only abnormal results are displayed) Labs Reviewed  COMPREHENSIVE METABOLIC PANEL - Abnormal; Notable for the following components:      Result Value   Sodium 134 (*)    Potassium 3.1 (*)    Calcium 8.2 (*)    Total Protein 6.4 (*)    Albumin 2.6 (*)    All other components within normal limits  CBC - Abnormal; Notable for the following components:   RDW 17.1 (*)    All other components within normal limits  URINALYSIS, ROUTINE W REFLEX MICROSCOPIC - Abnormal; Notable for the following components:   Leukocytes,Ua TRACE (*)    All other components within normal limits  LIPASE, BLOOD  I-STAT BETA HCG BLOOD, ED ([facility], [facility], [facility] ONLY)  TYPE AND SCREEN    EKG EKG Interpretation  Date/Time:  Monday February 08 2023 01:07:26 EST Ventricular Rate:  116 PR Interval:  159 QRS Duration: 82 QT Interval:  332 QTC Calculation: 462 R Axis:   49 Text Interpretation: Sinus tachycardia Confirmed by Quintella Reichert [phone]) on 02/08/2023 1:59:01  AM  Radiology DG Abdomen 1 View  Result Date: 02/08/2023 CLINICAL DATA:  47 year old female status post nasogastric tube placement. EXAM: ABDOMEN - 1 VIEW COMPARISON:  No priors. FINDINGS: There is a nasogastric tube which appears coiled in the proximal stomach, with side port approximately 7 cm distal to the gastroesophageal junction, and tip directed retrograde at the level of the gastroesophageal junction. Abdomen is incompletely imaged, but there are multiple dilated loops of small bowel compatible with persistent small bowel obstruction. No definite pneumoperitoneum noted. Surgical clips project over the right upper quadrant of the abdomen. Iodinated contrast material in the collecting system of both kidneys secondary to recent contrast enhanced CT examination. IMPRESSION: 1.  Support apparatus, as above. 2. Persistent dilatation of the small bowel indicative of persistent small bowel obstruction. Electronically Signed   By: Vinnie Langton M.D.   On: 02/08/2023 05:16   CT Abdomen Pelvis W Contrast  Result Date: 02/08/2023 CLINICAL DATA:  Abdominal pain, acute, nonlocalized. History of gastric ulcer and gastric sleeve. Severe abdominal pain and bloating. EXAM: CT ABDOMEN AND PELVIS WITH CONTRAST TECHNIQUE: Multidetector CT imaging of the abdomen and pelvis was performed using the standard protocol following bolus administration of intravenous contrast. RADIATION DOSE REDUCTION: This exam was performed according to the departmental dose-optimization program which includes automated exposure control, adjustment of the mA and/or kV according to patient size and/or use of iterative reconstruction technique. CONTRAST:  159m OMNIPAQUE IOHEXOL 300 MG/ML  SOLN COMPARISON:  None Available. FINDINGS: Lower chest: No acute abnormality. Hepatobiliary: No focal liver abnormality is seen. Status post cholecystectomy. No biliary dilatation. Pancreas: Unremarkable. No pancreatic ductal dilatation or surrounding  inflammatory changes. Spleen: Normal in size without focal abnormality. Adrenals/Urinary Tract: The adrenal glands are within normal limits. The kidneys enhance symmetrically. No renal calculus or hydronephrosis. Bladder is unremarkable. Stomach/Bowel: Gastric surgical changes are noted. No free air or pneumatosis. There are distended loops of small bowel in the abdomen measuring up to 3.9 cm. The distal small bowel in the pelvis is decompressed. Mesenteric swirling is noted in the mid right abdomen, best seen on axial images 40-43. The appendix is not visualized on exam. Vascular/Lymphatic: The aorta is and IVC are within normal limits. 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 The portal vein and splenic vein are patent. Enlarged lymph nodes are present at the mesenteric root, which may be reactive. Reproductive: Uterus and bilateral adnexa are unremarkable. Other: A trace amount of ascites is noted in the pelvis. Mesenteric edema is noted in the mid to lower abdomen. Musculoskeletal: Mild degenerative changes in the thoracolumbar spine. No acute osseous abnormality. IMPRESSION: 1. Multiple distended loops of small bowel in the abdomen measuring up to 3.8 cm. No definite transition point is delineated, however there is swirling of the mesentery in the mid right abdomen, concerning for internal hernia. Emergent surgical consultation is recommended. 2. Superior mesenteric vein is not seen at the level of mesenteric swirling in the mid right abdomen and mesenteric edema is noted in the mid to lower mesentery, possible superimposed veno-occlusive mesenteric ischemia versus venous congestion. 3. Mesenteric lymphadenopathy, likely reactive. Critical Value/emergent results were called by telephone at the time of interpretation on 02/08/2023 at 4:39 am [facility] who verbally acknowledged these results. Electronically Signed   By: LBrett FairyM.D.    On: 02/08/2023 04:40   DG Chest Port 1 View  Result Date: 02/08/2023 CLINICAL DATA:  Evaluate for free air EXAM: PORTABLE CHEST 1 VIEW COMPARISON:  05/31/2018 FINDINGS: Cardiac shadow is within normal limits. Lungs are well aerated bilaterally. Scattered large and small bowel gas is noted. No free intraperitoneal air is seen. No bony abnormality is noted. IMPRESSION: No active disease. Electronically Signed   By: MInez CatalinaM.D.   On: 02/08/2023 03:44    Procedures Procedures   CRITICAL CARE Performed by: EQuintella Reichert  Total critical care time: 60 minutes  Critical care time was exclusive of separately billable procedures and treating other patients.  Critical care was necessary to treat or prevent imminent or life-threatening deterioration.  Critical care was time spent personally by me on the following  activities: development of treatment plan with patient and/or surrogate as well as nursing, discussions with consultants, evaluation of patient's response to treatment, examination of patient, obtaining history from patient or surrogate, ordering and performing treatments and interventions, ordering and review of laboratory studies, ordering and review of radiographic studies, pulse oximetry and re-evaluation of patient's condition.  Medications Ordered in ED Medications  sodium chloride 0.9 % bolus 1,000 mL ( Intravenous MAR Hold 02/08/23 0640)  lactated ringers bolus 1,000 mL ( Intravenous MAR Hold 02/08/23 0640)  pantoprazole (PROTONIX) injection 40 mg ( Intravenous Automatically Held 02/16/23 2200)  cefoTEtan (CEFOTAN) 2 g in sodium chloride 0.9 % 100 mL IVPB (has no administration in time range)  ondansetron (ZOFRAN) injection 4 mg (4 mg Intravenous Given 02/08/23 0140)  HYDROmorphone (DILAUDID) injection 1 mg (1 mg Intravenous Given 02/08/23 0137)  pantoprazole (PROTONIX) injection 40 mg (40 mg Intravenous Given 02/08/23 0140)  sucralfate (CARAFATE) tablet 1 g (1 g Oral Given 02/08/23  0140)  HYDROmorphone (DILAUDID) injection 1 mg (1 mg Intravenous Given 02/08/23 0306)  simethicone (MYLICON) 40 0000000 suspension 40 mg (40 mg Oral Given 02/08/23 0305)  ketamine 50 mg in normal saline 5 mL (10 mg/mL) syringe (24 mg Intravenous Given 02/08/23 0325)  iohexol (OMNIPAQUE) 300 MG/ML solution 100 mL (100 mLs Intravenous Contrast Given 02/08/23 0402)  ondansetron (ZOFRAN) injection 4 mg (4 mg Intravenous Given 02/08/23 0409)  lactated ringers bolus 1,000 mL (1,000 mLs Intravenous New Bag/Given 02/08/23 0630)  Chlorhexidine Gluconate Cloth 2 % PADS 6 each (6 each Topical Given 02/08/23 0630)    ED Course/ Medical Decision Making/ A&P                             Medical Decision Making Amount and/or Complexity of Data Reviewed Radiology: ordered.  Risk Prescription drug management. Decision regarding hospitalization.   Patient with history of gastric ulcer, Nissen fundoplication, gastric sleeve here for evaluation of abdominal pain that started about 30 minutes prior to ED presentation.  She has mild tenderness on initial evaluation and was treated with IV medications for symptom management.  CBC without significant leukocytosis.  CMP without significant electrolyte abnormality.  During ED stay patient developed significant worsening of her abdominal pain with repeat examination with severe distention and tenderness.  Portable chest x-ray without any evidence of free air-images personally reviewed and interpreted, agree with radiologist interpretation.  She developed severe agitation and unrelenting pain despite medications and required ketamine for pain control.  CT scan was performed, which is concerning for possible closed-loop obstruction.  Discussed with Dr. Johney Maine with general surgery, will see the patient in the emergency department.  NG tube was placed and the patient did have some improvement in her pain after decompression with NG tube.  Plan to admit for ongoing care.  Patient updated  of findings of studies treatment plan and she is in agreement with plan.        Final Clinical Impression(s) / ED Diagnoses Final diagnoses:  SBO (small bowel obstruction) ([place])    Rx / DC Orders ED Discharge Orders     None         Quintella Reichert, MD 02/08/23 0730

## 2023-02-08 NOTE — Interval H&P Note (Signed)
History and Physical Interval Note:  02/08/2023 6:13 AM  Shelly Lane  has presented today for surgery, with the diagnosis of abdominal pain.  The various methods of treatment have been discussed with the patient and family. After consideration of risks, benefits and other options for treatment, the patient has consented to  Procedure(s): LAPAROSCOPY DIAGNOSTIC, POSSIBLE OPEN, POSSIBLE BOWEL RESECTION (N/A) as a surgical intervention.  The patient's history has been reviewed, patient examined, no change in status, stable for surgery.  I have reviewed the patient's chart and labs.  Questions were answered to the patient's satisfaction.    .scgup  Shelly Lane

## 2023-02-09 ENCOUNTER — Encounter (HOSPITAL_COMMUNITY): Payer: Self-pay | Admitting: General Surgery

## 2023-02-09 LAB — CBC
HCT: 30.2 % — ABNORMAL LOW (ref 36.0–46.0)
Hemoglobin: 9.6 g/dL — ABNORMAL LOW (ref 12.0–15.0)
MCH: 27.7 pg (ref 26.0–34.0)
MCHC: 31.8 g/dL (ref 30.0–36.0)
MCV: 87 fL (ref 80.0–100.0)
Platelets: 302 10*3/uL (ref 150–400)
RBC: 3.47 MIL/uL — ABNORMAL LOW (ref 3.87–5.11)
RDW: 17.3 % — ABNORMAL HIGH (ref 11.5–15.5)
WBC: 11.1 10*3/uL — ABNORMAL HIGH (ref 4.0–10.5)
nRBC: 0 % (ref 0.0–0.2)

## 2023-02-09 LAB — BASIC METABOLIC PANEL
Anion gap: 4 — ABNORMAL LOW (ref 5–15)
BUN: 6 mg/dL (ref 6–20)
CO2: 25 mmol/L (ref 22–32)
Calcium: 7.5 mg/dL — ABNORMAL LOW (ref 8.9–10.3)
Chloride: 108 mmol/L (ref 98–111)
Creatinine, Ser: 0.39 mg/dL — ABNORMAL LOW (ref 0.44–1.00)
GFR, Estimated: >60 mL/min (ref >60)
Glucose, Bld: 327 mg/dL — ABNORMAL HIGH (ref 70–99)
Potassium: 3 mmol/L — ABNORMAL LOW (ref 3.5–5.1)
Sodium: 137 mmol/L (ref 135–145)

## 2023-02-09 MED ORDER — OXYCODONE HCL 5 MG PO TABS
5.0000 mg | ORAL_TABLET | ORAL | Status: DC | PRN
  Administered 2023-02-09 – 2023-02-11 (×10): 10 mg via ORAL
  Filled 2023-02-09 (×11): qty 2

## 2023-02-09 MED ORDER — HYDROMORPHONE HCL 1 MG/ML IJ SOLN
1.0000 mg | INTRAMUSCULAR | Status: DC | PRN
  Administered 2023-02-10: 1 mg via INTRAVENOUS
  Filled 2023-02-09: qty 1

## 2023-02-09 MED ORDER — POTASSIUM CHLORIDE 20 MEQ PO PACK
20.0000 meq | PACK | Freq: Once | ORAL | Status: AC
  Administered 2023-02-09: 20 meq via ORAL
  Filled 2023-02-09: qty 1

## 2023-02-09 MED ORDER — ACETAMINOPHEN 325 MG PO TABS
650.0000 mg | ORAL_TABLET | Freq: Four times a day (QID) | ORAL | Status: DC
  Administered 2023-02-09 – 2023-02-10 (×4): 650 mg via ORAL
  Filled 2023-02-09 (×4): qty 2

## 2023-02-09 MED ORDER — DOCUSATE SODIUM 100 MG PO CAPS
100.0000 mg | ORAL_CAPSULE | Freq: Two times a day (BID) | ORAL | Status: DC
  Administered 2023-02-09 – 2023-02-12 (×7): 100 mg via ORAL
  Filled 2023-02-09 (×7): qty 1

## 2023-02-09 MED ORDER — POTASSIUM CHLORIDE 20 MEQ PO PACK
40.0000 meq | PACK | Freq: Once | ORAL | Status: AC
  Administered 2023-02-09: 40 meq
  Filled 2023-02-09: qty 2

## 2023-02-09 MED ORDER — METHOCARBAMOL 500 MG PO TABS
500.0000 mg | ORAL_TABLET | Freq: Three times a day (TID) | ORAL | Status: DC
  Administered 2023-02-09 (×3): 500 mg via ORAL
  Filled 2023-02-09 (×3): qty 1

## 2023-02-09 MED ORDER — PANTOPRAZOLE SODIUM 40 MG PO TBEC
40.0000 mg | DELAYED_RELEASE_TABLET | Freq: Two times a day (BID) | ORAL | Status: DC
  Administered 2023-02-09 – 2023-02-12 (×6): 40 mg via ORAL
  Filled 2023-02-09 (×6): qty 1

## 2023-02-09 MED ORDER — SIMETHICONE 80 MG PO CHEW
80.0000 mg | CHEWABLE_TABLET | Freq: Four times a day (QID) | ORAL | Status: DC | PRN
  Administered 2023-02-09 – 2023-02-12 (×5): 80 mg via ORAL
  Filled 2023-02-09 (×5): qty 1

## 2023-02-09 MED ORDER — ENSURE ENLIVE PO LIQD
237.0000 mL | Freq: Three times a day (TID) | ORAL | Status: DC
  Administered 2023-02-09 – 2023-02-12 (×6): 237 mL via ORAL

## 2023-02-09 NOTE — TOC CM/SW Note (Signed)
  Transition of Care [facility]) Screening Note   Patient Details  Name: Ryan Ducre Date of Birth: May 28, 1976   Transition of Care [facility]) CM/SW Contact:    Lennart Pall, LCSW Phone Number: 02/09/2023, 11:24 AM    Transition of Care Department [facility]) has reviewed patient and no TOC needs have been identified at this time. We will continue to monitor patient advancement through interdisciplinary progression rounds. If new patient transition needs arise, please place a TOC consult.

## 2023-02-09 NOTE — Progress Notes (Signed)
[facility] Progress Note  1 Day Post-Op  Subjective: CC:  Overall doing well, reports incisional pain. Denies flatus or BM yet. Denies nausea. Currently OOB and in chair.   Objective: Vital signs in last 24 hours: Temp:  [97.3 F (36.3 C)-98.9 F (37.2 C)] 98.3 F (36.8 C) (03/05 0635) Pulse Rate:  [92-118] 92 (03/05 0635) Resp:  [15-20] 17 (03/05 0635) BP: (118-135)/(82-93) 118/85 (03/05 0635) SpO2:  [94 %-100 %] 99 % (03/05 0635) Last BM Date : 02/06/23  Intake/Output from previous day: 03/04 0701 - 03/05 0700 In: 3718.3 [P.O.:240; I.V.:3228.3; IV Piggyback:250] Out: 2075 [Urine:2000; Emesis/NG output:50; Blood:25] Intake/Output this shift: No intake/output data recorded.  PE: Gen:  Alert, NAD, pleasant Card:  Regular rate and rhythm Pulm:  Normal effort ORA Abd: Soft, appropriately tender, midline incision with honeycomb in place and small amt SS strikethrough, mild distention. NG removed by me.  Skin: warm and dry, no rashes  Psych: A&Ox3   Lab Results:  Recent Labs    02/08/23 0049 02/09/23 0438  WBC 9.3 11.1*  HGB 12.0 9.6*  HCT 38.4 30.2*  PLT 358 302   BMET Recent Labs    02/08/23 0049 02/09/23 0438  NA 134* 137  K 3.1* 3.0*  CL 101 108  CO2 25 25  GLUCOSE 87 327*  BUN 14 6  CREATININE 0.53 0.39*  CALCIUM 8.2* 7.5*   PT/INR No results for input(s): "LABPROT", "INR" in the last 72 hours. CMP     Component Value Date/Time   NA 137 02/09/2023 0438   NA 137 11/06/2019 1533   K 3.0 (L) 02/09/2023 0438   CL 108 02/09/2023 0438   CO2 25 02/09/2023 0438   GLUCOSE 327 (H) 02/09/2023 0438   BUN 6 02/09/2023 0438   BUN 5 (L) 11/06/2019 1533   CREATININE 0.39 (L) 02/09/2023 0438   CREATININE 0.82 12/18/2021 0000   CALCIUM 7.5 (L) 02/09/2023 0438   PROT 6.4 (L) 02/08/2023 0049   PROT 6.2 11/06/2019 1533   ALBUMIN 2.6 (L) 02/08/2023 0049   ALBUMIN 3.8 11/06/2019 1533   AST 39 02/08/2023 0049   ALT 44 02/08/2023 0049   ALKPHOS  126 02/08/2023 0049   BILITOT 0.7 02/08/2023 0049   BILITOT 0.3 11/06/2019 1533   GFRNONAA >60 02/09/2023 0438   GFRAA >60 05/05/2020 1917   Lipase     Component Value Date/Time   LIPASE 26 02/08/2023 0049       Studies/Results: DG Abdomen 1 View  Result Date: 02/08/2023 CLINICAL DATA:  47 year old female status post nasogastric tube placement. EXAM: ABDOMEN - 1 VIEW COMPARISON:  No priors. FINDINGS: There is a nasogastric tube which appears coiled in the proximal stomach, with side port approximately 7 cm distal to the gastroesophageal junction, and tip directed retrograde at the level of the gastroesophageal junction. Abdomen is incompletely imaged, but there are multiple dilated loops of small bowel compatible with persistent small bowel obstruction. No definite pneumoperitoneum noted. Surgical clips project over the right upper quadrant of the abdomen. Iodinated contrast material in the collecting system of both kidneys secondary to recent contrast enhanced CT examination. IMPRESSION: 1. Support apparatus, as above. 2. Persistent dilatation of the small bowel indicative of persistent small bowel obstruction. Electronically Signed   By: Vinnie Langton M.D.   On: 02/08/2023 05:16   CT Abdomen Pelvis W Contrast  Result Date: 02/08/2023 CLINICAL DATA:  Abdominal pain, acute, nonlocalized. History of gastric ulcer and gastric sleeve. Severe abdominal pain and bloating.  EXAM: CT ABDOMEN AND PELVIS WITH CONTRAST TECHNIQUE: Multidetector CT imaging of the abdomen and pelvis was performed using the standard protocol following bolus administration of intravenous contrast. RADIATION DOSE REDUCTION: This exam was performed according to the departmental dose-optimization program which includes automated exposure control, adjustment of the mA and/or kV according to patient size and/or use of iterative reconstruction technique. CONTRAST:  189m OMNIPAQUE IOHEXOL 300 MG/ML  SOLN COMPARISON:  None  Available. FINDINGS: Lower chest: No acute abnormality. Hepatobiliary: No focal liver abnormality is seen. Status post cholecystectomy. No biliary dilatation. Pancreas: Unremarkable. No pancreatic ductal dilatation or surrounding inflammatory changes. Spleen: Normal in size without focal abnormality. Adrenals/Urinary Tract: The adrenal glands are within normal limits. The kidneys enhance symmetrically. No renal calculus or hydronephrosis. Bladder is unremarkable. Stomach/Bowel: Gastric surgical changes are noted. No free air or pneumatosis. There are distended loops of small bowel in the abdomen measuring up to 3.9 cm. The distal small bowel in the pelvis is decompressed. Mesenteric swirling is noted in the mid right abdomen, best seen on axial images 40-43. The appendix is not visualized on exam. Vascular/Lymphatic: The aorta is and IVC are within normal limits. 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 The portal vein and splenic vein are patent. Enlarged lymph nodes are present at the mesenteric root, which may be reactive. Reproductive: Uterus and bilateral adnexa are unremarkable. Other: A trace amount of ascites is noted in the pelvis. Mesenteric edema is noted in the mid to lower abdomen. Musculoskeletal: Mild degenerative changes in the thoracolumbar spine. No acute osseous abnormality. IMPRESSION: 1. Multiple distended loops of small bowel in the abdomen measuring up to 3.8 cm. No definite transition point is delineated, however there is swirling of the mesentery in the mid right abdomen, concerning for internal hernia. Emergent surgical consultation is recommended. 2. Superior mesenteric vein is not seen at the level of mesenteric swirling in the mid right abdomen and mesenteric edema is noted in the mid to lower mesentery, possible superimposed veno-occlusive mesenteric ischemia versus venous congestion. 3. Mesenteric lymphadenopathy, likely reactive.  Critical Value/emergent results were called by telephone at the time of interpretation on 02/08/2023 at 4:39 am to provider [facility], who verbally acknowledged these results. Electronically Signed   By: LBrett FairyM.D.   On: 02/08/2023 04:40   DG Chest Port 1 View  Result Date: 02/08/2023 CLINICAL DATA:  Evaluate for free air EXAM: PORTABLE CHEST 1 VIEW COMPARISON:  05/31/2018 FINDINGS: Cardiac shadow is within normal limits. Lungs are well aerated bilaterally. Scattered large and small bowel gas is noted. No free intraperitoneal air is seen. No bony abnormality is noted. IMPRESSION: No active disease. Electronically Signed   By: MInez CatalinaM.D.   On: 02/08/2023 03:44    Anti-infectives: Anti-infectives (From admission, onward)    Start     Dose/Rate Route Frequency Ordered Stop   02/08/23 0600  cefoTEtan (CEFOTAN) 2 g in sodium chloride 0.9 % 100 mL IVPB        2 g 200 mL/hr over 30 Minutes Intravenous On call to O.R. 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 RRosendo Gros -afebrile, VSS, WBC 11  - D/C NG tube and await bowel function - CLD advance as tolerated  - OOB/mobilize -  incentive spirometry   FEN: CLD, ADAT FLD, start ensure. Likely advance to soft this evening vs tomorrow AM ID: perioperative 3/4 VTE: SCD's, lovenox Foley; None Dispo: medsurg     LOS: 1 day   I reviewed nursing notes, last 24 h vitals and pain scores, last 48 h intake and output, last 24 h labs and trends, and last 24 h imaging results.   Obie Dredge, PA-C [facility] Please see Amion for pager number during day hours 7:00am-4:30pm

## 2023-02-09 NOTE — Inpatient Diabetes Management (Signed)
Inpatient Diabetes Program Recommendations  AACE/ADA: New Consensus Statement on Inpatient Glycemic Control (2015)  Target Ranges:  Prepandial:   less than 140 mg/dL      Peak postprandial:   less than 180 mg/dL (1-2 hours)      Critically ill patients:  140 - 180 mg/dL   Lab Results  Component Value Date   GLUCAP 61 (L) 05/11/2022   HGBA1C 5.4 11/06/2019    Review of Glycemic Control  Latest Reference Range & Units 02/09/23 04:38  Glucose 70 - 99 mg/dL 327 (H)  (H): Data is abnormally high Diabetes history: No DM hx Outpatient Diabetes medications: none Current orders for Inpatient glycemic control: none  Inpatient Diabetes Program Recommendations:    Am serum elevated in the setting of steroids, could consider adding CBGs TID?   Thanks, Bronson Curb, MSN, RNC-OB Diabetes Coordinator [phone] (8a-5p)

## 2023-02-10 LAB — BASIC METABOLIC PANEL
Anion gap: 5 (ref 5–15)
BUN: 6 mg/dL (ref 6–20)
CO2: 28 mmol/L (ref 22–32)
Calcium: 8.4 mg/dL — ABNORMAL LOW (ref 8.9–10.3)
Chloride: 105 mmol/L (ref 98–111)
Creatinine, Ser: 0.5 mg/dL (ref 0.44–1.00)
GFR, Estimated: >60 mL/min (ref >60)
Glucose, Bld: 83 mg/dL (ref 70–99)
Potassium: 3.9 mmol/L (ref 3.5–5.1)
Sodium: 138 mmol/L (ref 135–145)

## 2023-02-10 LAB — CBC
HCT: 30.5 % — ABNORMAL LOW (ref 36.0–46.0)
Hemoglobin: 9.9 g/dL — ABNORMAL LOW (ref 12.0–15.0)
MCH: 28.3 pg (ref 26.0–34.0)
MCHC: 32.5 g/dL (ref 30.0–36.0)
MCV: 87.1 fL (ref 80.0–100.0)
Platelets: 299 10*3/uL (ref 150–400)
RBC: 3.5 MIL/uL — ABNORMAL LOW (ref 3.87–5.11)
RDW: 17.4 % — ABNORMAL HIGH (ref 11.5–15.5)
WBC: 6.4 10*3/uL (ref 4.0–10.5)
nRBC: 0 % (ref 0.0–0.2)

## 2023-02-10 MED ORDER — POLYETHYLENE GLYCOL 3350 17 G PO PACK
17.0000 g | PACK | Freq: Every day | ORAL | Status: DC
  Administered 2023-02-10 – 2023-02-12 (×3): 17 g via ORAL
  Filled 2023-02-10 (×2): qty 1

## 2023-02-10 MED ORDER — METHOCARBAMOL 500 MG PO TABS
500.0000 mg | ORAL_TABLET | Freq: Four times a day (QID) | ORAL | Status: DC
  Administered 2023-02-10 – 2023-02-12 (×8): 500 mg via ORAL
  Filled 2023-02-10 (×9): qty 1

## 2023-02-10 MED ORDER — ACETAMINOPHEN 500 MG PO TABS
1000.0000 mg | ORAL_TABLET | Freq: Four times a day (QID) | ORAL | Status: DC
  Administered 2023-02-10 – 2023-02-12 (×7): 1000 mg via ORAL
  Filled 2023-02-10 (×8): qty 2

## 2023-02-10 NOTE — Progress Notes (Signed)
0922 the following medications was given by student nurse, Antonieta Pert in the presence of instructor: Tylenol tablet '1000mg'$ , Colace capsule '100mg'$ , Robaxin tablet '500mg'$ , oxycodone '10mg'$  Immediate release tablet and Protonix EC tablet '40mg'$ . Pt was informed of the medications given and what they were for; pt verbalized understanding. Pt tolerated medications well.

## 2023-02-10 NOTE — Discharge Instructions (Signed)
[facility]      [facility] [phone]  OPEN ABDOMINAL SURGERY: POST OP INSTRUCTIONS  Always review your discharge instruction sheet given to you by the facility where your surgery was performed.  IF YOU HAVE DISABILITY OR FAMILY LEAVE FORMS, YOU MUST BRING THEM TO THE OFFICE FOR PROCESSING.  PLEASE DO NOT GIVE THEM TO YOUR DOCTOR.  A prescription for pain medication may be given to you upon discharge.  Take your pain medication as prescribed, if needed.  If narcotic pain medicine is not needed, then you may take acetaminophen (Tylenol) or ibuprofen (Advil) as needed. Take your usually prescribed medications unless otherwise directed. If you need a refill on your pain medication, please contact your pharmacy. They will contact our office to request authorization.  Prescriptions will not be filled after 5pm or on week-ends. You should follow a light diet the first few days after arrival home, such as soup and crackers, pudding, etc.unless your doctor has advised otherwise. A high-fiber, low fat diet can be resumed as tolerated.   Be sure to include lots of fluids daily. Most patients will experience some swelling and bruising on the chest and neck area.  Ice packs will help.  Swelling and bruising can take several days to resolve Most patients will experience some swelling and bruising in the area of the incision. Ice pack will help. Swelling and bruising can take several days to resolve..  It is common to experience some constipation if taking pain medication after surgery.  Increasing fluid intake and taking a stool softener will usually help or prevent this problem from occurring.  A mild laxative (Milk of Magnesia or Miralax) should be taken according to package directions if there are no bowel movements after 48 hours.  You may have steri-strips (small skin tapes) in place directly over the incision.  These strips should be left on the skin for 7-10 days.  If your surgeon used skin  glue on the incision, you may shower in 24 hours.  The glue will flake off over the next 2-3 weeks.  Any sutures or staples will be removed at the office during your follow-up visit. You may find that a light gauze bandage over your incision may keep your staples from being rubbed or pulled. You may shower and replace the bandage daily. ACTIVITIES:  You may resume regular (light) daily activities beginning the next day--such as daily self-care, walking, climbing stairs--gradually increasing activities as tolerated.  You may have sexual intercourse when it is comfortable.  Refrain from any heavy lifting or straining until approved by your doctor. You may drive when you no longer are taking prescription pain medication, you can comfortably wear a seatbelt, and you can safely maneuver your car and apply brakes Return to Work: ___________________________________ You should see your doctor in the office for a follow-up appointment approximately two weeks after your surgery.  Make sure that you call for this appointment within a day or two after you arrive home to insure a convenient appointment time. OTHER INSTRUCTIONS:  _____________________________________________________________ _____________________________________________________________  WHEN TO CALL YOUR DOCTOR: Fever over 101.0 Inability to urinate Nausea and/or vomiting Extreme swelling or bruising Continued bleeding from incision. Increased pain, redness, or drainage from the incision. Difficulty swallowing or breathing Muscle cramping or spasms. Numbness or tingling in hands or feet or around lips.  The clinic staff is available to answer your questions during regular business hours.  Please don't hesitate to call and ask to speak to one of   the nurses if you have concerns.  For further questions, please visit [email or web address]  

## 2023-02-10 NOTE — Progress Notes (Addendum)
[facility] Progress Note  2 Days Post-Op  Subjective: CC:  Cc incisional pain. Tolerating liquids without nausea/vomiting. Reports less distention. +flatus. 2 very tiny BMs yesterday. Reports walking in the hall a lot.  Objective: Vital signs in last 24 hours: Temp:  [97.9 F (36.6 C)-98.6 F (37 C)] 98.6 F (37 C) (03/06 [id number]) Pulse Rate:  [91-108] 106 (03/06 0642) Resp:  [16-18] 18 (03/06 0642) BP: (110-126)/(87-92) 110/88 (03/06 0642) SpO2:  [98 %-100 %] 98 % (03/06 0642) Last BM Date : 02/06/23  Intake/Output from previous day: 03/05 0701 - 03/06 0700 In: 240 [P.O.:240] Out: 700 [Urine:700] Intake/Output this shift: No intake/output data recorded.  PE: Gen:  Alert, NAD, pleasant Card:  Regular rate and rhythm Pulm:  Normal effort ORA Abd: Soft, appropriately tender, midline incision with honeycomb in place and small amt SS strikethrough, mild distention. Skin: warm and dry, no rashes  Psych: A&Ox3   Lab Results:  Recent Labs    02/09/23 0438 02/10/23 0442  WBC 11.1* 6.4  HGB 9.6* 9.9*  HCT 30.2* 30.5*  PLT 302 299   BMET Recent Labs    02/09/23 0438 02/10/23 0442  NA 137 138  K 3.0* 3.9  CL 108 105  CO2 25 28  GLUCOSE 327* 83  BUN 6 6  CREATININE 0.39* 0.50  CALCIUM 7.5* 8.4*   PT/INR No results for input(s): "LABPROT", "INR" in the last 72 hours. CMP     Component Value Date/Time   NA 138 02/10/2023 0442   NA 137 11/06/2019 1533   K 3.9 02/10/2023 0442   CL 105 02/10/2023 0442   CO2 28 02/10/2023 0442   GLUCOSE 83 02/10/2023 0442   BUN 6 02/10/2023 0442   BUN 5 (L) 11/06/2019 1533   CREATININE 0.50 02/10/2023 0442   CREATININE 0.82 12/18/2021 0000   CALCIUM 8.4 (L) 02/10/2023 0442   PROT 6.4 (L) 02/08/2023 0049   PROT 6.2 11/06/2019 1533   ALBUMIN 2.6 (L) 02/08/2023 0049   ALBUMIN 3.8 11/06/2019 1533   AST 39 02/08/2023 0049   ALT 44 02/08/2023 0049   ALKPHOS 126 02/08/2023 0049   BILITOT 0.7 02/08/2023 0049    BILITOT 0.3 11/06/2019 1533   GFRNONAA >60 02/10/2023 0442   GFRAA >60 05/05/2020 1917   Lipase     Component Value Date/Time   LIPASE 26 02/08/2023 0049       Studies/Results: No results found.  Anti-infectives: Anti-infectives (From admission, onward)    Start     Dose/Rate Route Frequency Ordered Stop   02/08/23 0600  cefoTEtan (CEFOTAN) 2 g in sodium chloride 0.9 % 100 mL IVPB        2 g 200 mL/hr over 30 Minutes Intravenous On call to O.R. 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 Rosendo Gros  -afebrile, VSS, WBC WNL, hgb stable - advance to soft diet - increase tylenol and robaxin. Continue PRN oxy - possible PM discharge if pain controlled and tolerating PO.   FEN: SOFT ID: perioperative 3/4 VTE: SCD's, lovenox Foley; None Dispo: medsurg     LOS: 2 days   I reviewed nursing notes, last 24 h vitals and pain scores, last 48 h intake and output, last 24 h labs and trends, and last 24 h imaging results.   Obie Dredge, PA-C  [facility] Please see Amion for pager number during day hours 7:00am-4:30pm

## 2023-02-11 ENCOUNTER — Inpatient Hospital Stay (HOSPITAL_COMMUNITY): Payer: Medicaid Other

## 2023-02-11 DIAGNOSIS — R140 Abdominal distension (gaseous): Secondary | ICD-10-CM | POA: Diagnosis not present

## 2023-02-11 LAB — CBC WITH DIFFERENTIAL/PLATELET
Abs Immature Granulocytes: 0.02 10*3/uL (ref 0.00–0.07)
Basophils Absolute: 0.1 10*3/uL (ref 0.0–0.1)
Basophils Relative: 1 %
Eosinophils Absolute: 0.1 10*3/uL (ref 0.0–0.5)
Eosinophils Relative: 1 %
HCT: 37.7 % (ref 36.0–46.0)
Hemoglobin: 11.9 g/dL — ABNORMAL LOW (ref 12.0–15.0)
Immature Granulocytes: 0 %
Lymphocytes Relative: 39 %
Lymphs Abs: 2.9 10*3/uL (ref 0.7–4.0)
MCH: 27.7 pg (ref 26.0–34.0)
MCHC: 31.6 g/dL (ref 30.0–36.0)
MCV: 87.9 fL (ref 80.0–100.0)
Monocytes Absolute: 0.5 10*3/uL (ref 0.1–1.0)
Monocytes Relative: 6 %
Neutro Abs: 3.9 10*3/uL (ref 1.7–7.7)
Neutrophils Relative %: 53 %
Platelets: 462 10*3/uL — ABNORMAL HIGH (ref 150–400)
RBC: 4.29 MIL/uL (ref 3.87–5.11)
RDW: 17.3 % — ABNORMAL HIGH (ref 11.5–15.5)
WBC: 7.3 10*3/uL (ref 4.0–10.5)
nRBC: 0 % (ref 0.0–0.2)

## 2023-02-11 LAB — BASIC METABOLIC PANEL
Anion gap: 7 (ref 5–15)
BUN: <5 mg/dL — ABNORMAL LOW (ref 6–20)
CO2: 26 mmol/L (ref 22–32)
Calcium: 8.7 mg/dL — ABNORMAL LOW (ref 8.9–10.3)
Chloride: 104 mmol/L (ref 98–111)
Creatinine, Ser: 0.58 mg/dL (ref 0.44–1.00)
GFR, Estimated: >60 mL/min (ref >60)
Glucose, Bld: 180 mg/dL — ABNORMAL HIGH (ref 70–99)
Potassium: 3.7 mmol/L (ref 3.5–5.1)
Sodium: 137 mmol/L (ref 135–145)

## 2023-02-11 LAB — HCG, QUANTITATIVE, PREGNANCY: hCG, Beta Chain, Quant, S: 1 m[IU]/mL (ref <5)

## 2023-02-11 MED ORDER — OXYCODONE HCL 5 MG PO TABS
20.0000 mg | ORAL_TABLET | ORAL | Status: DC | PRN
  Administered 2023-02-11 – 2023-02-12 (×5): 20 mg via ORAL
  Filled 2023-02-11 (×6): qty 4

## 2023-02-11 MED ORDER — MAGNESIUM CITRATE PO SOLN
1.0000 | Freq: Once | ORAL | Status: AC
  Administered 2023-02-11: 1 via ORAL
  Filled 2023-02-11: qty 296

## 2023-02-11 NOTE — Progress Notes (Signed)
Paged Dr. Marcello Moores due to intense abdominal pain 10 out of 10. Pt crying and unable to sit still. Dr Marcello Moores gave verbal orders for CBC, BMP, and abd xray.

## 2023-02-11 NOTE — Progress Notes (Signed)
Pt denies being far enough along to show in urine pregnancy screen. This nurse called lab to see if blood draw could be used from CBC/BMP to do hcg blood. Lab confirmed it can be. Orders put in for blood pregnancy test.

## 2023-02-11 NOTE — Progress Notes (Signed)
[facility] Progress Note  3 Days Post-Op  Subjective: CC:  Cc incisional pain. States she feels like her intestines need to wake up. Has not passed gas since yesterday. Denies BM. Tolerating PO. Reports belching. Denies nausea.   Objective: Vital signs in last 24 hours: Temp:  [97.9 F (36.6 C)-98.4 F (36.9 C)] 97.9 F (36.6 C) (03/07 0530) Pulse Rate:  [94-103] 94 (03/07 0530) Resp:  [16] 16 (03/07 0530) BP: (107-120)/(75-86) 116/76 (03/07 0530) SpO2:  [97 %-99 %] 97 % (03/07 0530) Last BM Date : 02/10/23  Intake/Output from previous day: 03/06 0701 - 03/07 0700 In: 700 [P.O.:700] Out: 0  Intake/Output this shift: No intake/output data recorded.  PE: Gen:  Alert, NAD, pleasant Card:  Regular rate and rhythm Pulm:  Normal effort ORA Abd: Soft, appropriately tender, midline incision with honeycomb in place and small amt SS strikethrough, moderate distention. Skin: warm and dry, no rashes  Psych: A&Ox3   Lab Results:  Recent Labs    02/09/23 0438 02/10/23 0442  WBC 11.1* 6.4  HGB 9.6* 9.9*  HCT 30.2* 30.5*  PLT 302 299   BMET Recent Labs    02/09/23 0438 02/10/23 0442  NA 137 138  K 3.0* 3.9  CL 108 105  CO2 25 28  GLUCOSE 327* 83  BUN 6 6  CREATININE 0.39* 0.50  CALCIUM 7.5* 8.4*   PT/INR No results for input(s): "LABPROT", "INR" in the last 72 hours. CMP     Component Value Date/Time   NA 138 02/10/2023 0442   NA 137 11/06/2019 1533   K 3.9 02/10/2023 0442   CL 105 02/10/2023 0442   CO2 28 02/10/2023 0442   GLUCOSE 83 02/10/2023 0442   BUN 6 02/10/2023 0442   BUN 5 (L) 11/06/2019 1533   CREATININE 0.50 02/10/2023 0442   CREATININE 0.82 12/18/2021 0000   CALCIUM 8.4 (L) 02/10/2023 0442   PROT 6.4 (L) 02/08/2023 0049   PROT 6.2 11/06/2019 1533   ALBUMIN 2.6 (L) 02/08/2023 0049   ALBUMIN 3.8 11/06/2019 1533   AST 39 02/08/2023 0049   ALT 44 02/08/2023 0049   ALKPHOS 126 02/08/2023 0049   BILITOT 0.7 02/08/2023 0049    BILITOT 0.3 11/06/2019 1533   GFRNONAA >60 02/10/2023 0442   GFRAA >60 05/05/2020 1917   Lipase     Component Value Date/Time   LIPASE 26 02/08/2023 0049       Studies/Results: No results found.  Anti-infectives: Anti-infectives (From admission, onward)    Start     Dose/Rate Route Frequency Ordered Stop   02/08/23 0600  cefoTEtan (CEFOTAN) 2 g in sodium chloride 0.9 % 100 mL IVPB        2 g 200 mL/hr over 30 Minutes Intravenous On call to O.R. 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 Rosendo Gros  - afebrile, VSS, WBC WNL, hgb stable - reports belching, decreased flatus, distention - suspect ileus. Additionally she has chronic constipation. Check KUB. - increase tylenol and robaxin. Continue PRN oxy, increase to 20 mg q4h PRN (her home dose for chronic back pain).   FEN: SOFT; may need to back off diet but encouraged patient to go back to liquids or decrease PO intake for increased abd distention, nausea. ID: perioperative 3/4 VTE: SCD's, lovenox Foley; None Dispo: medsurg  LOS: 3 days   I reviewed nursing notes, last 24 h vitals and pain scores, last 48 h intake and output, last 24 h labs and trends, and last 24 h imaging results.   Obie Dredge, PA-C [facility] Please see Amion for pager number during day hours 7:00am-4:30pm

## 2023-02-11 NOTE — Progress Notes (Addendum)
Pt asked phlebotomy if she could get a preg blood test. This nurse asked pt if she suspects she is preg and she does. Called Dr. Marcello Moores to see if orders can be added for hcg blood test. Dr Marcello Moores unsure if this can be added to CBC/BMP. Verbal orders for urine pregnancy test.

## 2023-02-12 MED ORDER — DOCUSATE SODIUM 100 MG PO CAPS: 100.0000 mg | ORAL_CAPSULE | Freq: Two times a day (BID) | ORAL | 0 refills | Status: AC

## 2023-02-12 MED ORDER — ACETAMINOPHEN 500 MG PO TABS: 1000.0000 mg | ORAL_TABLET | Freq: Four times a day (QID) | ORAL | 0 refills | Status: AC

## 2023-02-12 MED ORDER — POLYETHYLENE GLYCOL 3350 17 GM/SCOOP PO POWD: 8.5000 g | Freq: Two times a day (BID) | ORAL | Status: AC

## 2023-02-12 MED ORDER — CALCIUM CARBONATE ANTACID 500 MG PO CHEW
400.0000 mg | CHEWABLE_TABLET | Freq: Three times a day (TID) | ORAL | Status: DC | PRN
  Administered 2023-02-12: 400 mg via ORAL
  Filled 2023-02-12: qty 2

## 2023-02-12 NOTE — Progress Notes (Signed)
Pt w multiple complaints this am. States unable to eat solid food but denies nausea. Later noted that family brought pt in [place] for breakfast. Also noted pt had ordered 2 entrees for lunch w extra sides. Enc pt to eat sm frequent meals to avoid GI issues, she verbalized understanding. Pt also complains of belching, abd pain and distension that was not present pre-surgery. In addition, pt complains of constipation and "no gas," although reminded pt that she had a large BM yesterday from the Mag Citrate. Pt walking in the halls, also complaining of reflux. Instructed she is on Protonix bid, shared above w PA and tums given. Will cont to monitor pt

## 2023-02-12 NOTE — Progress Notes (Signed)
Reviewed written d/c instructions w pt and all questions answered. She verbalized understanding. D/C via w/c in stable condition w all belongings.

## 2023-02-16 DIAGNOSIS — M79661 Pain in right lower leg: Secondary | ICD-10-CM | POA: Diagnosis not present

## 2023-02-16 DIAGNOSIS — M545 Low back pain, unspecified: Secondary | ICD-10-CM | POA: Diagnosis not present

## 2023-02-16 DIAGNOSIS — M79662 Pain in left lower leg: Secondary | ICD-10-CM | POA: Diagnosis not present

## 2023-02-16 DIAGNOSIS — M25552 Pain in left hip: Secondary | ICD-10-CM | POA: Diagnosis not present

## 2023-02-16 DIAGNOSIS — M25551 Pain in right hip: Secondary | ICD-10-CM | POA: Diagnosis not present

## 2023-02-16 DIAGNOSIS — J3089 Other allergic rhinitis: Secondary | ICD-10-CM | POA: Diagnosis not present

## 2023-02-16 DIAGNOSIS — G894 Chronic pain syndrome: Secondary | ICD-10-CM | POA: Diagnosis not present

## 2023-02-16 DIAGNOSIS — J301 Allergic rhinitis due to pollen: Secondary | ICD-10-CM | POA: Diagnosis not present

## 2023-02-16 DIAGNOSIS — J3081 Allergic rhinitis due to animal (cat) (dog) hair and dander: Secondary | ICD-10-CM | POA: Diagnosis not present

## 2023-02-17 NOTE — Discharge Summary (Signed)
[facility] Discharge Summary   Patient ID: Shelly Lane MRN: [id number] DOB/AGE: 06/16/1976 47 y.o.  Admit date: 02/08/2023 Discharge date: 02/12/2023  Admitting Diagnosis: SBO  Discharge Diagnosis Patient Active Problem List   Diagnosis Date Noted   History of lap gastric sleeve with loop gastrojejunostomy 02/08/2023   History of Nissen fundoplication [id number]   Irritable bowel syndrome (IBS) 02/08/2023   Chronic abdominal pain 02/08/2023   Gastrojejunal ulcer 02/08/2023   History of miscarriage 02/08/2023   Palpitations 02/08/2023   Premature delivery 02/08/2023   Premature labor 02/08/2023   History of multiple allergies 02/08/2023   Advanced maternal age in multigravida 02/08/2023   Constipation, chronic 02/08/2023   S/P exploratory laparotomy 02/08/2023   Iron deficiency anemia 12/25/2022   Panniculitis 10/13/2022   Eczema 08/13/2022   Displacement of intrauterine contraceptive device 10/13/2021   Pelvic floor dysfunction in female 10/13/2021   Insomnia 09/08/2021   Post-operative nausea and vomiting 07/23/2021   Vitamin D deficiency 06/17/2021   Cystocele with rectocele 05/19/2021   Chronic back pain 03/06/2021   Alpha thalassemia silent carrier 11/19/2019   Chronic pain 11/06/2019   History of postpartum depression 07/20/2018   Sciatic nerve pain, right 05/09/2018   SAB (spontaneous abortion) x 4 02/13/2013   Grand multipara 02/13/2013   Hx of preterm delivery x 2, currently pregnant 02/13/2013   Multiple drug allergies 02/13/2013   Bulimia nervosa 10/21/2007   ANXIETY 08/02/2007   Acute abdominal pain 08/02/2007   OBESITY 08/01/2007   Depression affecting pregnancy, antepartum 08/01/2007    Consultants None   Imaging: No results found.  Procedures Dr. Ralene Ok (02/08/23) - diagnostic laparoscopy converted to exploratory laparotomy   Hospital Course:  47 y/o F with remote history of gastric sleeve who presented with abdominal  pain .  Workup showed SBO with concern for internal hernia.  Patient was admitted and underwent procedure listed above.  Tolerated procedure well and was transferred to the floor.  Diet was advanced as tolerated.  On POD#4, the patient was voiding well, tolerating diet, ambulating well, pain well controlled, vital signs stable, incisions c/d/i and felt stable for discharge home.  Patient will follow up in our office as below and knows to call with questions or concerns.    I have personally reviewed the patients medication history on the [place] controlled substance database.   Physical Exam: General:  Alert, NAD, pleasant, comfortable Abd:  Soft, ND, mild tenderness, incisions C/D/I  Allergies as of 02/12/2023       Reactions   Hydrocodone Itching   Hydroxyzine Hcl Other (See Comments)   "Made me feel like I was going nuts".    Pineapple Itching, Swelling   Per pt itching throat and tongue swelling   Reglan [metoclopramide]    "Made me very weak and feel jumpy"   Subutex [buprenorphine] Itching        Medication List     STOP taking these medications    fluconazole 150 MG tablet Commonly known as: DIFLUCAN   ibuprofen 600 MG tablet Commonly known as: ADVIL   ondansetron 8 MG disintegrating tablet Commonly known as: ZOFRAN-ODT       TAKE these medications    acetaminophen 500 MG tablet Commonly known as: TYLENOL Take 2 tablets (1,000 mg total) by mouth every 6 (six) hours. What changed:  how much to take when to take this reasons to take this   conjugated estrogens vaginal cream Commonly known as: PREMARIN Place 0.5g nightly for two weeks  then twice a week after   cyclobenzaprine 10 MG tablet Commonly known as: FLEXERIL Take 10 mg by mouth 3 (three) times daily as needed for muscle spasms.   dexlansoprazole 60 MG capsule Commonly known as: DEXILANT Take 60 mg by mouth daily.   docusate sodium 100 MG capsule Commonly known as: COLACE Take 1 capsule (100 mg  total) by mouth 2 (two) times daily.   doxepin 10 MG capsule Commonly known as: SINEQUAN Take 10 mg by mouth at bedtime.   fluticasone 50 MCG/ACT nasal spray Commonly known as: FLONASE Place 2 sprays into both nostrils daily as needed for allergies or rhinitis.   lidocaine 2 % solution Commonly known as: XYLOCAINE Use as directed 15 mLs in the mouth or throat every 6 (six) hours as needed for mouth pain.   loratadine 10 MG tablet Commonly known as: CLARITIN Take 10 mg by mouth daily as needed for allergies.   multivitamin tablet Take 1 tablet by mouth daily.   OVER THE COUNTER MEDICATION Apply 1 patch topically daily. Multivitamin Patch Every 24 Hours   Oxycodone HCl 20 MG Tabs Take 1 tablet by mouth every 4 (four) hours.   polyethylene glycol powder 17 GM/SCOOP powder Commonly known as: MiraLax Take 8.5-34 g by mouth 2 (two) times daily. To correct constipation.  Adjust dose over 1-2 months.  Goal = ~1 bowel movement / day   sucralfate 1 g tablet Commonly known as: CARAFATE Take 1 g by mouth 4 (four) times daily -  with meals and at bedtime.   Vitamin D (Ergocalciferol) 1.25 MG (50000 UNIT) Caps capsule Commonly known as: DRISDOL Take 50,000 Units by mouth every 7 (seven) days.   zolpidem 10 MG tablet Commonly known as: AMBIEN Take 10 mg by mouth at bedtime.          Follow-up Information     Ralene Ok, MD. Go on 03/09/2023.   Specialty: General Surgery Why: at 10:00 AM for post-operative follow up. please arrive by 9:30 AM. Contact information: [address] [place] [address] [phone]         [facility]. Go on 02/22/2023.   Why: at 10:00 AM for staple removal from abdominal wall. Contact information: [address] [place] [phone]                Signed: Obie Dredge, [facility] 02/17/2023, 11:56 AM

## 2023-02-19 DIAGNOSIS — R197 Diarrhea, unspecified: Secondary | ICD-10-CM | POA: Diagnosis not present

## 2023-02-19 DIAGNOSIS — D509 Iron deficiency anemia, unspecified: Secondary | ICD-10-CM | POA: Diagnosis not present

## 2023-02-19 DIAGNOSIS — Z9884 Bariatric surgery status: Secondary | ICD-10-CM | POA: Diagnosis not present

## 2023-02-19 DIAGNOSIS — K219 Gastro-esophageal reflux disease without esophagitis: Secondary | ICD-10-CM | POA: Diagnosis not present

## 2023-02-19 DIAGNOSIS — Z9889 Other specified postprocedural states: Secondary | ICD-10-CM | POA: Diagnosis not present

## 2023-02-19 DIAGNOSIS — R1084 Generalized abdominal pain: Secondary | ICD-10-CM | POA: Diagnosis not present

## 2023-02-19 DIAGNOSIS — K56609 Unspecified intestinal obstruction, unspecified as to partial versus complete obstruction: Secondary | ICD-10-CM | POA: Diagnosis not present

## 2023-02-21 DIAGNOSIS — R1084 Generalized abdominal pain: Secondary | ICD-10-CM | POA: Diagnosis not present

## 2023-02-21 DIAGNOSIS — K219 Gastro-esophageal reflux disease without esophagitis: Secondary | ICD-10-CM | POA: Diagnosis not present

## 2023-02-21 DIAGNOSIS — D509 Iron deficiency anemia, unspecified: Secondary | ICD-10-CM | POA: Diagnosis not present

## 2023-02-21 DIAGNOSIS — Z9884 Bariatric surgery status: Secondary | ICD-10-CM | POA: Diagnosis not present

## 2023-02-21 DIAGNOSIS — K6389 Other specified diseases of intestine: Secondary | ICD-10-CM | POA: Diagnosis not present

## 2023-02-21 DIAGNOSIS — R197 Diarrhea, unspecified: Secondary | ICD-10-CM | POA: Diagnosis not present

## 2023-02-21 DIAGNOSIS — Z9049 Acquired absence of other specified parts of digestive tract: Secondary | ICD-10-CM | POA: Diagnosis not present

## 2023-02-21 DIAGNOSIS — N281 Cyst of kidney, acquired: Secondary | ICD-10-CM | POA: Diagnosis not present

## 2023-02-21 DIAGNOSIS — K760 Fatty (change of) liver, not elsewhere classified: Secondary | ICD-10-CM | POA: Diagnosis not present

## 2023-02-21 DIAGNOSIS — K56609 Unspecified intestinal obstruction, unspecified as to partial versus complete obstruction: Secondary | ICD-10-CM | POA: Diagnosis not present

## 2023-02-21 DIAGNOSIS — Z9889 Other specified postprocedural states: Secondary | ICD-10-CM | POA: Diagnosis not present

## 2023-02-22 DIAGNOSIS — R197 Diarrhea, unspecified: Secondary | ICD-10-CM | POA: Diagnosis not present

## 2023-03-08 DIAGNOSIS — Z419 Encounter for procedure for purposes other than remedying health state, unspecified: Secondary | ICD-10-CM | POA: Diagnosis not present

## 2023-03-09 DIAGNOSIS — N926 Irregular menstruation, unspecified: Secondary | ICD-10-CM | POA: Diagnosis not present

## 2023-03-19 DIAGNOSIS — J3089 Other allergic rhinitis: Secondary | ICD-10-CM | POA: Diagnosis not present

## 2023-03-19 DIAGNOSIS — M545 Low back pain, unspecified: Secondary | ICD-10-CM | POA: Diagnosis not present

## 2023-03-19 DIAGNOSIS — G894 Chronic pain syndrome: Secondary | ICD-10-CM | POA: Diagnosis not present

## 2023-03-19 DIAGNOSIS — M79662 Pain in left lower leg: Secondary | ICD-10-CM | POA: Diagnosis not present

## 2023-03-19 DIAGNOSIS — J3081 Allergic rhinitis due to animal (cat) (dog) hair and dander: Secondary | ICD-10-CM | POA: Diagnosis not present

## 2023-03-19 DIAGNOSIS — M25551 Pain in right hip: Secondary | ICD-10-CM | POA: Diagnosis not present

## 2023-03-19 DIAGNOSIS — M79661 Pain in right lower leg: Secondary | ICD-10-CM | POA: Diagnosis not present

## 2023-03-19 DIAGNOSIS — J301 Allergic rhinitis due to pollen: Secondary | ICD-10-CM | POA: Diagnosis not present

## 2023-03-19 DIAGNOSIS — M25552 Pain in left hip: Secondary | ICD-10-CM | POA: Diagnosis not present

## 2023-03-23 DIAGNOSIS — N959 Unspecified menopausal and perimenopausal disorder: Secondary | ICD-10-CM | POA: Diagnosis not present

## 2023-03-23 DIAGNOSIS — E559 Vitamin D deficiency, unspecified: Secondary | ICD-10-CM | POA: Diagnosis not present

## 2023-03-23 DIAGNOSIS — N951 Menopausal and female climacteric states: Secondary | ICD-10-CM | POA: Diagnosis not present

## 2023-03-23 DIAGNOSIS — D519 Vitamin B12 deficiency anemia, unspecified: Secondary | ICD-10-CM | POA: Diagnosis not present

## 2023-03-23 DIAGNOSIS — E611 Iron deficiency: Secondary | ICD-10-CM | POA: Diagnosis not present

## 2023-03-23 DIAGNOSIS — R5383 Other fatigue: Secondary | ICD-10-CM | POA: Diagnosis not present

## 2023-03-31 DIAGNOSIS — R197 Diarrhea, unspecified: Secondary | ICD-10-CM | POA: Diagnosis not present

## 2023-03-31 DIAGNOSIS — R1013 Epigastric pain: Secondary | ICD-10-CM | POA: Diagnosis not present

## 2023-03-31 DIAGNOSIS — Z538 Procedure and treatment not carried out for other reasons: Secondary | ICD-10-CM | POA: Diagnosis not present

## 2023-04-15 DIAGNOSIS — G8929 Other chronic pain: Secondary | ICD-10-CM | POA: Diagnosis not present

## 2023-04-15 DIAGNOSIS — M5451 Vertebrogenic low back pain: Secondary | ICD-10-CM | POA: Diagnosis not present

## 2023-04-16 DIAGNOSIS — R5383 Other fatigue: Secondary | ICD-10-CM | POA: Diagnosis not present

## 2023-04-16 DIAGNOSIS — N959 Unspecified menopausal and perimenopausal disorder: Secondary | ICD-10-CM | POA: Diagnosis not present

## 2023-04-16 DIAGNOSIS — J3089 Other allergic rhinitis: Secondary | ICD-10-CM | POA: Diagnosis not present

## 2023-04-16 DIAGNOSIS — E559 Vitamin D deficiency, unspecified: Secondary | ICD-10-CM | POA: Diagnosis not present

## 2023-04-16 DIAGNOSIS — E611 Iron deficiency: Secondary | ICD-10-CM | POA: Diagnosis not present

## 2023-04-16 DIAGNOSIS — M79661 Pain in right lower leg: Secondary | ICD-10-CM | POA: Diagnosis not present

## 2023-04-16 DIAGNOSIS — M25551 Pain in right hip: Secondary | ICD-10-CM | POA: Diagnosis not present

## 2023-04-16 DIAGNOSIS — G894 Chronic pain syndrome: Secondary | ICD-10-CM | POA: Diagnosis not present

## 2023-04-16 DIAGNOSIS — J301 Allergic rhinitis due to pollen: Secondary | ICD-10-CM | POA: Diagnosis not present

## 2023-04-16 DIAGNOSIS — M25552 Pain in left hip: Secondary | ICD-10-CM | POA: Diagnosis not present

## 2023-04-16 DIAGNOSIS — J3081 Allergic rhinitis due to animal (cat) (dog) hair and dander: Secondary | ICD-10-CM | POA: Diagnosis not present

## 2023-04-16 DIAGNOSIS — N951 Menopausal and female climacteric states: Secondary | ICD-10-CM | POA: Diagnosis not present

## 2023-04-16 DIAGNOSIS — M79662 Pain in left lower leg: Secondary | ICD-10-CM | POA: Diagnosis not present

## 2023-04-16 DIAGNOSIS — M545 Low back pain, unspecified: Secondary | ICD-10-CM | POA: Diagnosis not present

## 2023-04-16 DIAGNOSIS — D519 Vitamin B12 deficiency anemia, unspecified: Secondary | ICD-10-CM | POA: Diagnosis not present

## 2023-04-20 DIAGNOSIS — G8929 Other chronic pain: Secondary | ICD-10-CM | POA: Diagnosis not present

## 2023-04-20 DIAGNOSIS — M5451 Vertebrogenic low back pain: Secondary | ICD-10-CM | POA: Diagnosis not present

## 2023-04-22 DIAGNOSIS — Z9884 Bariatric surgery status: Secondary | ICD-10-CM | POA: Diagnosis not present

## 2023-04-22 DIAGNOSIS — D509 Iron deficiency anemia, unspecified: Secondary | ICD-10-CM | POA: Diagnosis not present

## 2023-04-22 DIAGNOSIS — G8929 Other chronic pain: Secondary | ICD-10-CM | POA: Diagnosis not present

## 2023-04-22 DIAGNOSIS — M5451 Vertebrogenic low back pain: Secondary | ICD-10-CM | POA: Diagnosis not present

## 2024-11-30 ENCOUNTER — Emergency Department (HOSPITAL_COMMUNITY)
Admission: EM | Admit: 2024-11-30 | Discharge: 2024-11-30 | Disposition: A | Discharge status: Home / Self Care | Attending: Emergency Medicine | Admitting: Emergency Medicine

## 2024-11-30 ENCOUNTER — Other Ambulatory Visit: Payer: Self-pay

## 2024-11-30 ENCOUNTER — Encounter (HOSPITAL_COMMUNITY): Payer: Self-pay

## 2024-11-30 DIAGNOSIS — F419 Anxiety disorder, unspecified: Secondary | ICD-10-CM | POA: Diagnosis present

## 2024-11-30 DIAGNOSIS — R000 Tachycardia, unspecified: Secondary | ICD-10-CM | POA: Insufficient documentation

## 2024-11-30 MED ORDER — ALPRAZOLAM 0.5 MG PO TABS
0.5000 mg | ORAL_TABLET | Freq: Once | ORAL | Status: AC
  Administered 2024-11-30: 0.5 mg via ORAL
  Filled 2024-11-30: qty 1

## 2024-11-30 NOTE — ED Provider Notes (Signed)
 " [facility] AT [facility] Provider Note   CSN: [id number] Arrival date & time: 11/30/24  2143     Patient presents with: Anxiety   Shelly Lane is a 48 y.o. female.  Patient is a 48 year old female with a history of anxiety, depression, and chronic back pain who presents to the ED for evaluation after a panic attack.  Patient notes approximately an hour and a half she was driving when she began to have a panic attack.  She notes that her heart was racing, she felt lightheaded, she was sweating, and hands felt tingling.  She notes that she has a history of panic attacks and this does feel similar.  She states she has a lot of stressors right now such as going through divorce and just returning from a trip overseas.  She notes she does have a lot going on at home right now.  She states she is seen by psychiatry but is out of one of her medications for anxiety and depression as she just returned home a few days ago.  She notes she still does have a psychiatrist in the area.  She does have Xanax  prescribed as needed for anxiety.  She did take half a tablet during this that helped improve symptoms some but states she is still having some feelings of heart racing.  Notes she believes she passed out while sitting in the car for approximately 30 seconds but states this will happen when she has her panic attacks.  Denies headache, dizziness, shortness of breath, abdominal pain, nausea/vomiting/diarrhea.   Anxiety Pertinent negatives include no chest pain, no headaches and no shortness of breath.       Prior to Admission medications  Medication Sig Start Date End Date Taking? Authorizing Provider  acetaminophen  (TYLENOL ) 500 MG tablet Take 2 tablets (1,000 mg total) by mouth every 6 (six) hours. 02/12/23   Simaan, Elizabeth S, PA-C  conjugated estrogens  (PREMARIN ) vaginal cream Place 0.5g nightly for two weeks then twice a week after Patient not taking: Reported on  02/08/2023 07/06/22   Marilynne Rosaline SAILOR, MD  cyclobenzaprine  (FLEXERIL ) 10 MG tablet Take 10 mg by mouth 3 (three) times daily as needed for muscle spasms. 12/21/20   [provider]  dexlansoprazole (DEXILANT) 60 MG capsule Take 60 mg by mouth daily.    [provider]  docusate sodium  (COLACE) 100 MG capsule Take 1 capsule (100 mg total) by mouth 2 (two) times daily. 02/12/23   Augustus Almarie RAMAN, PA-C  doxepin (SINEQUAN) 10 MG capsule Take 10 mg by mouth at bedtime.    [provider]  fluticasone  (FLONASE ) 50 MCG/ACT nasal spray Place 2 sprays into both nostrils daily as needed for allergies or rhinitis. 09/22/20   [provider]  lidocaine  (XYLOCAINE ) 2 % solution Use as directed 15 mLs in the mouth or throat every 6 (six) hours as needed for mouth pain. 01/03/23   [provider]  loratadine  (CLARITIN ) 10 MG tablet Take 10 mg by mouth daily as needed for allergies. 09/22/20   [provider]  Multiple Vitamin (MULTIVITAMIN) tablet Take 1 tablet by mouth daily.    [provider]  OVER THE COUNTER MEDICATION Apply 1 patch topically daily. Multivitamin Patch Every 24 Hours    [provider]  Oxycodone  HCl 20 MG TABS Take 1 tablet by mouth every 4 (four) hours.    [provider]  polyethylene glycol powder (MIRALAX ) 17 GM/SCOOP powder Take 8.5-34 g  by mouth 2 (two) times daily. To correct constipation.  Adjust dose over 1-2 months.  Goal = ~1 bowel movement / day 02/12/23   Augustus Almarie RAMAN, PA-C  sucralfate  (CARAFATE ) 1 g tablet Take 1 g by mouth 4 (four) times daily -  with meals and at bedtime. 10/27/10   [provider]  Vitamin D, Ergocalciferol, (DRISDOL) 1.25 MG (50000 UNIT) CAPS capsule Take 50,000 Units by mouth every 7 (seven) days. 07/21/22   [provider]  zolpidem  (AMBIEN ) 10 MG tablet Take 10 mg by mouth at bedtime.    [provider]    Allergies: Hydrocodone , Hydroxyzine   hcl, Pineapple, Reglan  [metoclopramide ], and Subutex [buprenorphine]    Review of Systems  Constitutional:  Negative for chills and fever.  Respiratory:  Negative for shortness of breath.   Cardiovascular:  Positive for palpitations. Negative for chest pain.  Gastrointestinal:  Negative for nausea and vomiting.  Neurological:  Positive for syncope and light-headedness. Negative for dizziness, seizures and headaches.  Psychiatric/Behavioral:  Negative for hallucinations and suicidal ideas. The patient is nervous/anxious.     Updated Vital Signs BP (!) 124/93 (BP Location: Left Arm)   Pulse (!) 119   Temp 97.8 F (36.6 C) (Oral)   Resp 18   LMP 11/27/2024 (Exact Date)   SpO2 98%   Physical Exam Constitutional:      Appearance: Normal appearance.  HENT:     Head: Normocephalic and atraumatic.     Mouth/Throat:     Mouth: Mucous membranes are moist.  Eyes:     Pupils: Pupils are equal, round, and reactive to light.  Cardiovascular:     Rate and Rhythm: Tachycardia present.  Pulmonary:     Effort: Pulmonary effort is normal.     Breath sounds: Normal breath sounds.  Skin:    General: Skin is warm and dry.  Neurological:     Mental Status: She is alert and oriented to person, place, and time.     Cranial Nerves: No cranial nerve deficit.  Psychiatric:     Comments: Anxious     (all labs ordered are listed, but only abnormal results are displayed) Labs Reviewed - No data to display  EKG: EKG Interpretation Date/Time:  Thursday November 30 2024 22:32:55 EST Ventricular Rate:  101 PR Interval:  192 QRS Duration:  77 QT Interval:  331 QTC Calculation: 429 R Axis:   59  Text Interpretation: Sinus tachycardia Paired ventricular premature complexes Aberrant conduction of SV complex(es) Low voltage, precordial leads Confirmed by Mannie Pac [phone]) on 11/30/2024 10:47:05 PM  Radiology: No results found.    Medications Ordered in the ED  ALPRAZolam  (XANAX ) tablet  0.5 mg (0.5 mg Oral Given 11/30/24 2228)                                   Medical Decision Making Patient is a 48 year old female with a history of anxiety, depression, and chronic back pain who presents to the ED for evaluation after panic attack.  Please see detailed HPI above.  On exam patient is alert and in no acute distress.  Physical exam as noted above.  She is mildly anxious but pleasant and cooperative.  No acute neurologic deficits noted.  She is mildly tachycardic but otherwise vital signs stable.  EKG obtained that shows sinus tachycardia.  Rate was 101.  Differential includes breakthrough anxiety, electrolyte abnormality, viral illness, depression, ACS.  Less concerns for lab abnormality or ACS as patient's symptoms are improving and she notes that this is very similar to her previous panic attacks.  She has had similar panic attacks for several years.  Of note patient was placed on a Medrol Dosepak by her pain doctor a couple days ago that could be contributing to palpitations as well.  She was given Xanax  while in ED and states she is feeling much improved.  No concerns for further emergent workup at this time.  Stable for discharge home.  Advised to continue previous medications as prescribed and call psychiatry tomorrow to schedule follow-up.  Strict return precautions provided for worsening symptoms.  Risk Prescription drug management.     Final diagnoses:  Anxiety    ED Discharge Orders     None          Shelly Lane 11/30/24 2321    Mannie Pac T, DO 12/03/24 1509  "

## 2024-11-30 NOTE — ED Triage Notes (Signed)
 Pt has history of anxiety and states that she feels like she is having a panic attack. Pt reports heart racing, tingling in fingers, and feels hot and then cold.

## 2024-11-30 NOTE — Discharge Instructions (Signed)
 Continue medications as previously prescribed.  May continue to take Xanax  as needed for any breakthrough anxiety or panic attacks.  Please call psychiatry to schedule follow-up appointment.  Return to ED if any symptoms worsen including severe chest pain, difficulty breathing, uncontrolled panic attack, uncontrolled nausea/vomiting, new syncopal episode.

## 2024-12-18 ENCOUNTER — Encounter: Payer: Self-pay | Admitting: *Deleted
